# Patient Record
Sex: Male | Born: 1975 | Race: White | Hispanic: No | Marital: Single | Smoking: Never smoker
Health system: Southern US, Community
[De-identification: ages and names within clinical notes are randomized; demographics above are authoritative.]

## PROBLEM LIST (undated history)

## (undated) DIAGNOSIS — R519 Headache, unspecified: Secondary | ICD-10-CM

## (undated) DIAGNOSIS — F419 Anxiety disorder, unspecified: Secondary | ICD-10-CM

## (undated) DIAGNOSIS — K219 Gastro-esophageal reflux disease without esophagitis: Secondary | ICD-10-CM

## (undated) DIAGNOSIS — I484 Atypical atrial flutter: Secondary | ICD-10-CM

## (undated) DIAGNOSIS — I471 Supraventricular tachycardia, unspecified: Secondary | ICD-10-CM

## (undated) DIAGNOSIS — I4891 Unspecified atrial fibrillation: Secondary | ICD-10-CM

## (undated) DIAGNOSIS — R51 Headache: Secondary | ICD-10-CM

## (undated) DIAGNOSIS — F32A Depression, unspecified: Secondary | ICD-10-CM

## (undated) DIAGNOSIS — E785 Hyperlipidemia, unspecified: Secondary | ICD-10-CM

## (undated) DIAGNOSIS — F1010 Alcohol abuse, uncomplicated: Secondary | ICD-10-CM

## (undated) DIAGNOSIS — I10 Essential (primary) hypertension: Secondary | ICD-10-CM

## (undated) DIAGNOSIS — Q203 Discordant ventriculoarterial connection: Secondary | ICD-10-CM

## (undated) DIAGNOSIS — R011 Cardiac murmur, unspecified: Secondary | ICD-10-CM

## (undated) DIAGNOSIS — A770 Spotted fever due to Rickettsia rickettsii: Secondary | ICD-10-CM

## (undated) DIAGNOSIS — F329 Major depressive disorder, single episode, unspecified: Secondary | ICD-10-CM

## (undated) HISTORY — DX: Discordant ventriculoarterial connection: Q20.3

## (undated) HISTORY — PX: FRACTURE SURGERY: SHX138

## (undated) HISTORY — DX: Hyperlipidemia, unspecified: E78.5

## (undated) HISTORY — DX: Atypical atrial flutter: I48.4

## (undated) HISTORY — PX: FINGER ARTHROPLASTY: SHX5017

## (undated) HISTORY — PX: SEPTOSTOMY: SHX2394

## (undated) HISTORY — DX: Essential (primary) hypertension: I10

---
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
## 1998-04-17 ENCOUNTER — Ambulatory Visit (HOSPITAL_COMMUNITY): Admission: RE | Admit: 1998-04-17 | Discharge: 1998-04-17 | Payer: Self-pay | Admitting: *Deleted

## 1998-04-17 ENCOUNTER — Observation Stay (HOSPITAL_COMMUNITY): Admission: RE | Admit: 1998-04-17 | Discharge: 1998-04-18 | Payer: Self-pay | Admitting: Unknown Physician Specialty

## 1998-04-17 ENCOUNTER — Encounter: Admission: RE | Admit: 1998-04-17 | Discharge: 1998-04-17 | Payer: Self-pay | Admitting: *Deleted

## 1999-07-19 ENCOUNTER — Encounter: Admission: RE | Admit: 1999-07-19 | Discharge: 1999-07-19 | Payer: Self-pay | Admitting: *Deleted

## 1999-07-19 ENCOUNTER — Ambulatory Visit (HOSPITAL_COMMUNITY): Admission: RE | Admit: 1999-07-19 | Discharge: 1999-07-19 | Payer: Self-pay | Admitting: *Deleted

## 1999-07-19 ENCOUNTER — Encounter: Payer: Self-pay | Admitting: *Deleted

## 1999-09-20 ENCOUNTER — Ambulatory Visit (HOSPITAL_COMMUNITY): Admission: RE | Admit: 1999-09-20 | Discharge: 1999-09-20 | Payer: Self-pay | Admitting: *Deleted

## 2001-01-07 ENCOUNTER — Ambulatory Visit (HOSPITAL_COMMUNITY): Admission: RE | Admit: 2001-01-07 | Discharge: 2001-01-07 | Payer: Self-pay | Admitting: *Deleted

## 2001-01-07 ENCOUNTER — Encounter: Payer: Self-pay | Admitting: *Deleted

## 2001-01-22 ENCOUNTER — Ambulatory Visit (HOSPITAL_BASED_OUTPATIENT_CLINIC_OR_DEPARTMENT_OTHER): Admission: RE | Admit: 2001-01-22 | Discharge: 2001-01-22 | Payer: Self-pay | Admitting: *Deleted

## 2004-07-27 ENCOUNTER — Ambulatory Visit: Payer: Self-pay | Admitting: Internal Medicine

## 2004-11-17 ENCOUNTER — Emergency Department (HOSPITAL_COMMUNITY): Admission: EM | Admit: 2004-11-17 | Discharge: 2004-11-17 | Payer: Self-pay | Admitting: *Deleted

## 2006-03-20 ENCOUNTER — Ambulatory Visit: Payer: Self-pay | Admitting: Internal Medicine

## 2007-07-01 ENCOUNTER — Encounter: Payer: Self-pay | Admitting: Internal Medicine

## 2007-07-01 ENCOUNTER — Ambulatory Visit: Payer: Self-pay

## 2008-02-19 ENCOUNTER — Emergency Department (HOSPITAL_COMMUNITY): Admission: EM | Admit: 2008-02-19 | Discharge: 2008-02-19 | Payer: Self-pay | Admitting: Emergency Medicine

## 2008-11-25 ENCOUNTER — Telehealth: Payer: Self-pay | Admitting: Internal Medicine

## 2008-11-28 ENCOUNTER — Encounter (INDEPENDENT_AMBULATORY_CARE_PROVIDER_SITE_OTHER): Payer: Self-pay | Admitting: *Deleted

## 2008-11-28 DIAGNOSIS — Q203 Discordant ventriculoarterial connection: Secondary | ICD-10-CM | POA: Insufficient documentation

## 2008-12-20 ENCOUNTER — Telehealth: Payer: Self-pay | Admitting: Internal Medicine

## 2008-12-21 ENCOUNTER — Emergency Department (HOSPITAL_COMMUNITY): Admission: EM | Admit: 2008-12-21 | Discharge: 2008-12-21 | Payer: Self-pay | Admitting: Family Medicine

## 2009-01-03 ENCOUNTER — Encounter: Payer: Self-pay | Admitting: Internal Medicine

## 2009-01-03 ENCOUNTER — Ambulatory Visit: Payer: Self-pay

## 2009-01-24 ENCOUNTER — Ambulatory Visit: Payer: Self-pay | Admitting: Internal Medicine

## 2009-01-24 DIAGNOSIS — M629 Disorder of muscle, unspecified: Secondary | ICD-10-CM

## 2009-01-24 DIAGNOSIS — M6289 Other specified disorders of muscle: Secondary | ICD-10-CM | POA: Insufficient documentation

## 2009-01-25 LAB — CONVERTED CEMR LAB
Cholesterol: 184 mg/dL (ref 0–200)
HDL: 38.9 mg/dL — ABNORMAL LOW (ref >39.00)
LDL Cholesterol: 132 mg/dL — ABNORMAL HIGH (ref 0–99)
Total CHOL/HDL Ratio: 5
Triglycerides: 64 mg/dL (ref 0.0–149.0)
VLDL: 12.8 mg/dL (ref 0.0–40.0)

## 2009-01-26 ENCOUNTER — Ambulatory Visit: Payer: Self-pay | Admitting: Internal Medicine

## 2009-01-26 DIAGNOSIS — I10 Essential (primary) hypertension: Secondary | ICD-10-CM | POA: Insufficient documentation

## 2009-04-20 ENCOUNTER — Telehealth: Payer: Self-pay | Admitting: Internal Medicine

## 2009-04-21 ENCOUNTER — Ambulatory Visit: Payer: Self-pay | Admitting: Internal Medicine

## 2009-04-21 DIAGNOSIS — E785 Hyperlipidemia, unspecified: Secondary | ICD-10-CM | POA: Insufficient documentation

## 2009-04-21 DIAGNOSIS — I10 Essential (primary) hypertension: Secondary | ICD-10-CM | POA: Insufficient documentation

## 2009-04-25 LAB — CONVERTED CEMR LAB
BUN: 10 mg/dL (ref 6–23)
Basophils Absolute: 0 10*3/uL (ref 0.0–0.1)
Basophils Relative: 0.5 % (ref 0.0–3.0)
Calcium: 10 mg/dL (ref 8.4–10.5)
Chloride: 102 meq/L (ref 96–112)
Creatinine, Ser: 0.9 mg/dL (ref 0.4–1.5)
Eosinophils Absolute: 0.3 10*3/uL (ref 0.0–0.7)
Eosinophils Relative: 4.6 % (ref 0.0–5.0)
GFR calc non Af Amer: 104.59 mL/min (ref >60)
Glucose, Bld: 90 mg/dL (ref 70–99)
HCT: 47.6 % (ref 39.0–52.0)
Hemoglobin: 16.4 g/dL (ref 13.0–17.0)
Lymphocytes Relative: 22.5 % (ref 12.0–46.0)
Lymphs Abs: 1.5 10*3/uL (ref 0.7–4.0)
MCHC: 34.4 g/dL (ref 30.0–36.0)
MCV: 92.1 fL (ref 78.0–100.0)
Monocytes Absolute: 0.9 10*3/uL (ref 0.1–1.0)
Monocytes Relative: 14.2 % — ABNORMAL HIGH (ref 3.0–12.0)
Neutro Abs: 3.8 10*3/uL (ref 1.4–7.7)
Neutrophils Relative %: 58.2 % (ref 43.0–77.0)
Platelets: 252 10*3/uL (ref 150.0–400.0)
Potassium: 5.3 meq/L — ABNORMAL HIGH (ref 3.5–5.1)
RBC: 5.16 M/uL (ref 4.22–5.81)
RDW: 11.9 % (ref 11.5–14.6)
Sodium: 140 meq/L (ref 135–145)
WBC: 6.5 10*3/uL (ref 4.5–10.5)

## 2009-05-11 ENCOUNTER — Encounter: Payer: Self-pay | Admitting: Internal Medicine

## 2009-05-23 ENCOUNTER — Telehealth: Payer: Self-pay | Admitting: Internal Medicine

## 2009-06-07 ENCOUNTER — Telehealth: Payer: Self-pay | Admitting: Internal Medicine

## 2009-10-04 ENCOUNTER — Encounter (INDEPENDENT_AMBULATORY_CARE_PROVIDER_SITE_OTHER): Payer: Self-pay | Admitting: *Deleted

## 2010-04-26 ENCOUNTER — Encounter: Payer: Self-pay | Admitting: Internal Medicine

## 2010-04-26 ENCOUNTER — Ambulatory Visit: Payer: Self-pay | Admitting: Internal Medicine

## 2010-05-04 ENCOUNTER — Telehealth: Payer: Self-pay | Admitting: Internal Medicine

## 2010-08-14 NOTE — Progress Notes (Signed)
Summary: LABS NEEDED?  Phone Note Call from Patient Call back at [phone]   Caller: [facility] MOTHER Reason for Call: Talk to Nurse Summary of Call: PATIENT NEEDS TO HAVE LABS DONE PRIOR TO APPT 01-13-09?   Initial call taken by: Burnard Leigh,  December 20, 2008 10:19 AM  Follow-up for Phone Call        Talked to pt.s mother about labs done prior appointmet with MD on July 2nd 10. Because pt has not been in office   for a couple of years,Dr. Tenny Craw needs to see him first to decide what labs he needs to have drawn. Mother understood. Follow-up by: Ollen Gross, RN, BSN,  December 20, 2008 11:49 AM      Appended Document: LABS NEEDED? Needs and echo.  Can get that Day.  ALso needs fasting lipid panel.  Maybe he wants to come a couple days before.  Appended Document: LABS NEEDED? Fasting lab work scheduled for 01/10/2009.Marland KitchenMarland KitchenObed is aware

## 2010-08-14 NOTE — Progress Notes (Signed)
Summary: irregular heart beat/gas  Phone Note Call from Patient Call back at Home Phone [phone]   Caller: Patient Reason for Call: Talk to Nurse Summary of Call: irregular heart beat... not racing, feels like he has gas, has appt tomorrow, did not know if he needed to come today Initial call taken by: Migdalia Dk,  April 20, 2009 9:47 AM  Follow-up for Phone Call        pt has been feeling bad w/head cold that has moved into his chest, pt pulse is ok not too fast, c/o gas since eating fried chicken and green beans, no chest pain, pt is sch to see Dr Tenny Craw in AM at 8:15, advised to keep that appt, if symptoms change or heart begins to race or dev. chest pain will go to ER Meredith Staggers, RN  April 20, 2009 10:15 AM

## 2010-08-14 NOTE — Progress Notes (Signed)
Summary: Medication Question  Phone Note Call from Patient Call back at Home Phone [phone]   Caller: Patient Reason for Call: Talk to Nurse Summary of Call: req call back asap... has a question about meds Initial call taken by: Migdalia Dk,  May 23, 2009 11:27 AM  Follow-up for Phone Call        I spoke with the pt and he is very anxious and has a lot of stress in his life at this time.  The pt is going to see his primary MD about starting Xanax and he wanted to know if that would effect his Lisinopril.  I told the pt that Xanax should be okay to take with Lisinopril.  I also reminded the pt that he is due for bloodwork to follow-up on his potassium.  The patient said he would come in this week for bloodwork. Follow-up by: Julieta Gutting, RN, BSN,  May 23, 2009 11:44 AM

## 2010-08-14 NOTE — Progress Notes (Signed)
Summary: rx refill  Phone Note Refill Request Message from:  Patient on May 04, 2010 11:51 AM  Refills Requested: Medication #1:  LISINOPRIL 10 MG TABS 1/2  tablet every day [facility] # [phone]    Method Requested: Telephone to Pharmacy Initial call taken by: Roe Coombs,  May 04, 2010 11:52 AM Caller: Patient

## 2010-08-14 NOTE — Assessment & Plan Note (Signed)
Summary: 3 month follow up/mt   Visit Type:  Follow-up Primary Provider:  [facility] [place]  CC:  no complaints.  History of Present Illness: Mr. Matthew [name] is a 35 year old who, followed in clinic. He was last seen in 2010. 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 His last echocardiogram was in 2010. He also has a history of hypertension.  Is on Lisinopril for BP as well as his heart. He denies chest pains.  Breathing is good.  No palpitations.  He is fairly active but not like he used to be when he played baseball daily.  No edema.  Current Medications (verified): 1)  [facility] Brand Acid Reducer .Marland Kitchen.. 1 Tab Every Other Day If Needed 2)  Ibuprofen 200 Mg Tabs (Ibuprofen) .... As Needed 3)  Lisinopril 10 Mg Tabs (Lisinopril) .... 1/2  Tablet Every Day 4)  Alprazolam 0.5 Mg Tabs (Alprazolam) .Marland Kitchen.. 1 Tab As Directed  Allergies (verified): 1)  ! Asa 2)  ! Ibuprofen  Past History:  Past medical, surgical, family and social histories (including risk factors) reviewed, and no changes noted (except as noted below).  Past Medical History: Reviewed history from 04/21/2009 and no changes required. 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  Volar plate arthroplasty, right small finger proxima linterphalangeal joint.  Rashkind septostomy  Mustard procedure  Family History: Reviewed history from 01/12/2009 and no changes required. Father alive -Rheumatic  fever- HTN Mother alive healthy  Social History: Reviewed history from 01/12/2009 and no changes required.  non-smoker, no drug abuse, occasional drinker   Review of Systems       All systems reviewed.  Neg to the above problem except as noted above.  Vital Signs:  Patient profile:   35 year old male Height:       75 inches Weight:      219 pounds BMI:     27.47 Pulse rate:   87 / minute BP sitting:   104 / 81  (left arm) Cuff size:   large  Vitals Entered By: Burnett Kanaris, CNA (April 26, 2010 12:10 PM)  Physical Exam  Additional Exam:  Pateint is in NAD HEENT:  Normocephalic, atraumatic. EOMI, PERRLA.  Neck: JVP is normal. No thyromegaly. No bruits.  Lungs: clear to auscultation. No rales no wheezes.  Heart: Regular rate and rhythm. Normal S1.  Prominent S2.  Gr I/VI systolic murmur.  No S3.  Abdomen:  Supple, nontender. Normal bowel sounds. No masses. No hepatomegaly.  Extremities:   Good distal pulses throughout. No lower extremity edema.  Musculoskeletal :moving all extremities.  Neuro:   alert and oriented x3.    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  No evidence of decompensation of the RV.  No symp to suggest arrhythmia.  Would follow.  Keep on lisinopril.  Echo in June/july of next year.  Problem # 2:  HYPERTENSION, BENIGN (ICD-401.1) BP is better. His updated medication list for this problem includes:    Lisinopril 10 Mg Tabs (Lisinopril) .Marland Kitchen... 1/2  tablet every day  Problem # 3:  PREVENTIVE HEALTH CARE (ICD-V70.0) Counseled on diet and exercise.  Other Orders: EKG w/ Interpretation (93000)  Patient Instructions: 1)  Follow up in June/July of 2012  with echo that day.

## 2010-08-14 NOTE — Letter (Signed)
Summary: [facility]  [facility]   Imported By: Roderic Ovens 06/13/2009 10:55:53  _____________________________________________________________________  External Attachment:    Type:   Image     Comment:   External Document

## 2010-08-14 NOTE — Progress Notes (Signed)
Summary: what can he take over the counter  Phone Note Call from Patient Call back at Home Phone [phone]   Caller: Patient Reason for Call: Talk to Nurse Details for Reason: what can pt take for head cold, since he's on blood pressure meds. pcp was not contacted.  Initial call taken by: Lorne Skeens,  June 07, 2009 11:25 AM  Follow-up for Phone Call        spoke w/pt no decongestants and no pseudo. Meredith Staggers, RN  June 07, 2009 11:39 AM

## 2010-08-14 NOTE — Letter (Signed)
Summary: [facility]  [facility]   Imported By: Marylou Mccoy 07/27/2009 10:38:07  _____________________________________________________________________  External Attachment:    Type:   Image     Comment:   External Document

## 2010-08-14 NOTE — Letter (Signed)
Summary: Appointment - Missed  [facility], Main Office  [address]   [place], [place] [address]   Phone: [phone]  Fax: [phone]     October 04, 2009 MRN: [id number]   JULIOUS LANGLOIS [address] [place], [place]  [address]   Dear Mr. THIER,  Our records indicate you missed your appointment on 09/29/2009 with Dr.  Tenny Craw. It is very important that we reach you to reschedule this appointment. We look forward to participating in your health care needs. Please contact us at the number listed above at your earliest convenience to reschedule this appointment.     Sincerely,    Glass blower/designer

## 2010-08-14 NOTE — Assessment & Plan Note (Signed)
Summary: MISSED APPT 01-13-09@8 :45/SL   Visit Type:  Follow-up [facility] Provider:  [facility] [place]  CC:  no complaints.  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 He was last seen in April of 2009. He has a history of transposition of the great vessels (status post rash and septoplasty, status post Mustard procedure). His last echocardiogram was in 2008. He just had an echocardiogram done the past couple weeks.  Since seen he has been doing well. He denies shortness of breath. He denies chest pain. He denies palpitations. He denies edema. He is sleeping okay. His wife is about to have a baby in the next couple weeks. He says now, that he's been married. His activities have gone down some and he is eating more.  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 1 Tab Every Other Day If Needed  Allergies (verified): 1)  ! Asa 2)  ! 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  Rashkind septostomy  Mustard procedure  Family History: Last updated: 01/12/2009 Father alive -Rheumatic  fever- HTN Mother alive healthy  Social History: Last updated: 01/12/2009  non-smoker, no drug abuse, occasional drinker   Review of Systems       all systems reviewed. Negative to the above problem except as noted.  Vital Signs:  Patient profile:   35 year old male Height:      75 inches Weight:      213 pounds BMI:     26.72 BP sitting:   137 / 90  (left arm)  Vitals Entered By: Burnett Kanaris, CNA (January 26, 2009 9:36  AM)  Physical Exam  General:  Well developed, well nourished, in no acute distress. Mouth:  Teeth, gums and palate normal. Oral mucosa normal. Neck:  JVP normal. No bruits. No thyromegaly. Lungs:  third auscultation without rales, or wheezes. Heart:  regular rate, rhythm, S1 prominent S2. Grade 3/6 systolic murmur. Left sternal border. Prominent RV E. Abdomen:  supple no hepatomegaly.no masses Pulses:  2+ pulses. 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 I'm not convinced this is different from previous. There is only mild MR. Conduits appear to be patent. With his borderline blood pressure in the findings above, I would add an ACE inhibitor to his regimen. I will follow up in the early fall off the mat. Will be planned for 10 days.  Problem # 2:  HYPERTENSION, BORDERLINE (ICD-401.9) Will add lisinopril to his regimen. One half tablet per day. His updated medication list for this problem includes:    Lisinopril 5 Mg Tabs (Lisinopril) .Marland Kitchen... Take one tablet by mouth daily  Problem # 3:  PREVENTIVE HEALTH CARE (ICD-V70.0) counseled on lipids. Patient would like to try diet activity. This is reasonable. His LDL is minimally increased, but with his congenital disease. Would want to prevent future heart events.  Patient Instructions: 1)  Your physician recommends that you schedule a follow-up appointment in: October 2010 2)  Your physician recommends that you return for lab work in: bmet in 10 days 402.1

## 2010-08-14 NOTE — Progress Notes (Signed)
Summary: WHAT LABS ARE NEEDED PRIOR TO APPT?  Phone Note Call from Patient Call back at [phone]   Caller: Mom Summary of Call: PATIENT NEEDS TO SCHEDULE LABS...WHAT IS NE2007.HAS NOT BEEN SEEN SINCE  Initial call taken by: Burnard Leigh,  Nov 25, 2008 12:29 PM  Follow-up for Phone Call        Pt has not been seen since 2007.  Pt has appt 01/13/09 with Dr Tenny Craw.  Will send to Dr Tenny Craw for review (? echo, labs) Julieta Gutting, RN, BSN  Nov 25, 2008 12:58 PM   Additional Follow-up for Phone Call Additional follow up Details #1::        Should have echo. Could set up for day of appt.      Appended Document: WHAT LABS ARE NEEDED PRIOR TO APPT? Lm. for pt, does not need labs needs echo scheduled per Dr. Tenny Craw...will send to [facility] to schedule.

## 2010-08-14 NOTE — Assessment & Plan Note (Signed)
Summary: 3 month rov/'sl   Primary Matthew [name]:  [facility] [place]   History of Present Illness: Matthew [name] is a 35 year old with a history of transposition of the great vessels status post rash can septostomy; status post mustard procedure). I last saw him in July.  When I saw him his blood pressure was minimally elevated. I added a low dose lisinopril to his regimen 2.5 mg.  Since seen he has done okay until recently. He has a sinus infection that may be clearing. She denies fevers, chills. Did have some tooth pain, but gone. He does say he is tending to be more of a mouth breather at night. He has bought nasal strips to try to help with this.  Yesterday he had a few palpitations, lasting a few seconds, not associated with dizziness, or shortness of breath.  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 1 Tab Every Other Day If Needed 2)  Lisinopril 5 Mg Tabs (Lisinopril) .... Take One Tablet By Mouth Daily  Current Medications (verified): 1)  [employer] Brand Acid Reducer .Marland Kitchen.. 1 Tab Every Other Day If Needed 2)  Lisinopril 5 Mg Tabs (Lisinopril) .... Take1/2 Tablet By Mouth Daily 3)  Ibuprofen 200 Mg Tabs (Ibuprofen) .... As Needed  Allergies (verified): 1)  ! Asa 2)  ! Ibuprofen  Past History:  Past Surgical History: Last updated: 01/12/2009  Volar plate arthroplasty, right small finger proxima linterphalangeal joint.  Rashkind septostomy  Mustard procedure  Family History: Last updated: 01/12/2009 Father alive -Rheumatic  fever- HTN Mother alive healthy  Social History: Last updated: 01/12/2009  non-smoker, no drug abuse, occasional drinker   Past Medical History: Current Problems:  COMPLETE TRANSPOSITION OF GREAT VESSELS  (ICD-745.10) hypertension dyslipidemia  Vital Signs:  Patient profile:   35 year old male Height:      75 inches Weight:      219 pounds BMI:     27.47 Pulse rate:   94 / minute Resp:     18 per minute BP sitting:   138 / 96  (right arm)  Vitals Entered By: Marrion Coy, CNA (April 21, 2009 8:56 AM)  Physical Exam  General:  Well developed, well nourished, in no acute distress. Head:  normocephalic and atraumatic Neck:  JVP is normal Lungs:  CTA Heart:  RRR.  S1, Prominent S2.  No significant murmurs.  NO S3 Abdomen:  No hepatomegly Extremities:  No edema   EKG  Procedure date:  04/21/2009  Findings:      sinus rhythm, 94 beats per minute. Incomplete right bundle branch block. RVH. LVH.ST-T wave changes consistent with strain.  no significant change from previous  Impression & Recommendations:  Problem # 1:  HYPERTENSION, BENIGN (ICD-401.1) blood pressure is still a little high, diastolic in the 90s. I would increase his lisinopril to, 5 we'll check labs today. I will see him back in January. Encouraged him to lose weight. He is going to see dietary The following medications were removed from the medication list:    Lisinopril 5 Mg Tabs (Lisinopril) .Marland Kitchen... Take one tablet by mouth daily His updated medication list for this problem includes:    Lisinopril 10 Mg Tabs (Lisinopril) .Marland Kitchen... 1 tablet every day  Problem # 2:  COMPLETE TRANSPOSITION OF GREAT  VESSELS (ICD-745.10) status post mustard procedure. Clinically doing well. Echo remains stable. I am  going up on the lisinopril to improve blood pressure control, afterload reduction.  note short burst of palpitations. Some like isolated skips I'm not concerned about significant arrhythmia. Again, avoid decongestants/stimulant  Problem # 3:  HYPERLIPIDEMIA-MIXED (ICD-272.4) have set up for dietary referral.  Problem # 4:  PREVENTIVE HEALTH CARE (ICD-V70.0) patient with a lot of nasal congestion sounds like inflammation  of the sinuses does not appear to be a bacterial sinusitis with his mouth breathing at night. I would add a nasal steroid see if this helps. Followup in January  Other Orders: EKG w/ Interpretation (93000) TLB-CBC Platelet - w/Differential (85025-CBCD) TLB-BMP (Basic Metabolic Panel-BMET) (80048-METABOL)  Patient Instructions: 1)  Your physician recommends that you return for lab work in: lab work today... cbc bmet... we will call you with results. 2)  An appointment for you to see the Dietician has been scheduled. Please keep your appointment or be sure to call if you need to reschedule.  dietician will call you with appointment. 3)  Your physician wants you to follow-up in:  January 2011  You will receive a reminder letter in the mail two months in advance. If you don't receive a letter, please call our office to schedule the follow-up appointment. Prescriptions: NASONEX 50 MCG/ACT SUSP (MOMETASONE FUROATE) 1 spray each nosil every dayl  #1 x 6   Entered by:   Layne Benton, RN, BSN   Authorized by:   Sherrill Raring, MD, [facility]   Signed by:   Layne Benton, RN, BSN on 04/21/2009   Method used:   Electronically to        [facility] [phone] (retail)       [address]       [place], [place]  [address]       Ph: [phone]       Fax: [phone]   RxID:   [id number] LISINOPRIL 10 MG TABS (LISINOPRIL) 1 tablet every day  #30 x 6   Entered by:   Layne Benton, RN, BSN   Authorized by:   Sherrill Raring, MD, [facility]   Signed by:   Layne Benton, RN, BSN on 04/21/2009   Method used:   Electronically to        [facility] [phone]* (retail)       [address]       [place], [place]  [address]       Ph: [phone]       Fax: [phone]   RxID:   [id number]

## 2010-11-30 NOTE — Assessment & Plan Note (Signed)
[facility]                              CARDIOLOGY OFFICE NOTE   Matthew [name], Matthew [name]                  MRN:          [id number]  DATE:03/20/2006                            DOB:          1976/03/21    IDENTIFICATION:  Matthew [name] is a 35 year old gentleman with a history of  transposition of the great vessels (status post Rashkind septostomy, status  post Mustard procedure).  I last saw him back in January 2006.  Last echo  prior to this was in 2005.   Since I have seen him he has done well.  He quit his job at [employer] and says  his blood pressure has improved and his stress has gone markedly down.  He  denies shortness of breath, no chest pain, no palpitations.   CURRENT MEDICATIONS:  None.   PHYSICAL EXAMINATION:  GENERAL:  Patient in no distress.  Blood pressure  136/88.  Pulse is 77 and regular.  Weight 188.  LUNGS:  Clear.  NECK:  JVP is normal.  CARDIAC:  Regular rate and rhythm.  S1.  Prominent S2.  Grade 1/6 systolic  murmur left sternal border.  Prominent increased RV impulse.  ABDOMEN:  Benign.  No hepatomegaly.  EXTREMITIES:  Equal onset.  No edema.   12-lead EKG normal sinus rhythm 77 beats per minute.  ST depression V2  through V6-1.  No significant change.  Slightly more prominent than  previous.   IMPRESSION:  1. Matthew [name] is a 35 year old with transposition of the great vessels,      status post Mustard procedure.  Clinically appears to be doing very      well.  In regard to his blood pressure he has checked it and he says it      has been running good.  Today upper limits of normal.  2. I would recommend another echocardiogram to evaluate.  Also check a      fasting lipid panel given his age and schedule tentatively followup in      18 months time.  3. I encouraged him to continue to remain active.  He is thinking of      getting a bicycle and I think this would be good exercise for him.      Pricilla Riffle, MD, [facility]    PVR/MedQ  DD:  03/20/2006  DT:  03/21/2006  Job #:  [id number]

## 2010-11-30 NOTE — Op Note (Signed)
. [facility]  Patient:    Matthew [name], Matthew [name]                  MRN: [id number] Proc. Date: 01/22/01 Adm. Date:  [id number] Attending:  Meyerdierks, Emelda Fear CC:         Jerelene Redden, MD   Operative Report  PREOPERATIVE DIAGNOSIS:  Comminuted intra-articular fracture dislocation proximal interphalangeal joint, right small finger.  POSTOPERATIVE DIAGNOSIS:  Comminuted intra-articular fracture dislocation proximal interphalangeal joint, right small finger.  OPERATION PERFORMED:  Volar plate arthroplasty, right small finger proximal interphalangeal joint.  SURGEON:  Lowell Bouton, M.D.  ANESTHESIA:  General.  OPERATIVE FINDINGS:  The patient had a comminuted fracture of the volar half of the articular surface of the middle phalanx at the PIP joint of the right small finger.  The middle phalanx was subluxed dorsally on the proximal phalanx.  The articular surface appeared relatively smooth without significant degeneration.  DESCRIPTION OF PROCEDURE:  Under general anesthesia with a tourniquet on the right arm, the right hand was prepped and draped in the usual sterile fashion and after exsanguinating the limb the tourniquet was inflated to 250 mmHg.  A V-shaped incision was made over the volar aspect of the PIP joint with a radially based flap.  Sharp dissection was carried through the subcutaneous tissues.  The bleeding points were coagulated.  Blunt dissection was carried down to the flexor sheath and the A3 pulley was incised transversely.  The flexor tendons were retracted radially and the fracture fragment was identified.  The volar plate was elevated from distal to proximal and the bone fragments attached to the volar plate were removed.  The collateral ligaments were then released radially and ulnarly with a Beaver blade.  The joint was hyperextended and the fracture fragments volarly were removed with a rongeur. The  dorsal half of the articular surface was present and after completely excising the collateral ligaments, reduction of the joint could be obtained. The PIP and DIP joints were quite stiff as the injury was three weeks old. After reducing the joint, the finger was manipulated down into full flexion at both the PIP and DIP joint.  The volar plate was then freed up as far proximal as possible and a 3-0 Prolene suture was placed in a Kessler fashion in the volar plate.  Two Mellody Dance needles were passed through the fracture from the volar aspect to the dorsal aspect of the middle phalanx.  This was done with the DIP flexed, so as not to impale the extensor mechanism.  The Prolene was then passed through the Plymouth needle and brought out dorsally and tied over a button.  This reduced the joint and x-ray showed good alignment.  The volar plate then was advanced into the defect in the bone after tying the Prolene over a button.  A 35 K-wire was then used to pin the PIP joint in about 30 degrees of flexion.  X-ray showed reduction of the joint.  The wound was irrigated with saline.  The pulley system was repaired with a 4-0 Vicryl and the skin with a 4-0 nylon.  Sterile dressings were applied followed by a dorsal protective splint.  The tourniquet was released with good circulation to the hand.  0.5% Marcaine was inserted as a digital block for pain control. The patient went to the recovery room awake and stable in good condition. DD:  01/22/01 TD:  01/22/01 Job: [id number]

## 2011-02-22 ENCOUNTER — Encounter: Payer: Self-pay | Admitting: Internal Medicine

## 2011-02-25 ENCOUNTER — Telehealth: Payer: Self-pay | Admitting: Internal Medicine

## 2011-02-25 ENCOUNTER — Ambulatory Visit: Payer: Self-pay | Admitting: Internal Medicine

## 2011-02-25 NOTE — Telephone Encounter (Signed)
Spoke with Dr.Ross about this patient. She ordered echo. Will schedule office visit and echo on the same day. Lorne Skeens to call patient with appointments.

## 2011-02-25 NOTE — Telephone Encounter (Signed)
Pt would like to be set up for echo on same day he see dr. Tenny Craw.

## 2011-02-26 ENCOUNTER — Telehealth: Payer: Self-pay | Admitting: Internal Medicine

## 2011-02-26 NOTE — Telephone Encounter (Signed)
Pt called and said he thinks he need echo before his f/u appt.  No recall in .  Please ck with Dr. Tenny Craw and confirm that this is needed before appt.  Please let patient know.  Aware that Annice Pih is off today.

## 2011-02-26 NOTE — Telephone Encounter (Signed)
Please see previous phone note. Will route to [place] to schedule the echocardiogram and f/u with Dr. Tenny Craw.

## 2011-04-03 ENCOUNTER — Encounter: Payer: Self-pay | Admitting: *Deleted

## 2011-04-12 ENCOUNTER — Ambulatory Visit (HOSPITAL_COMMUNITY): Payer: BC Managed Care – PPO | Attending: Internal Medicine | Admitting: Radiology

## 2011-04-12 ENCOUNTER — Encounter: Payer: Self-pay | Admitting: Internal Medicine

## 2011-04-12 ENCOUNTER — Ambulatory Visit (INDEPENDENT_AMBULATORY_CARE_PROVIDER_SITE_OTHER): Payer: BC Managed Care – PPO | Admitting: Internal Medicine

## 2011-04-12 VITALS — BP 134/87 | HR 87 | Ht 74.0 in | Wt 214.0 lb

## 2011-04-12 DIAGNOSIS — I079 Rheumatic tricuspid valve disease, unspecified: Secondary | ICD-10-CM | POA: Insufficient documentation

## 2011-04-12 DIAGNOSIS — J309 Allergic rhinitis, unspecified: Secondary | ICD-10-CM

## 2011-04-12 DIAGNOSIS — Z9109 Other allergy status, other than to drugs and biological substances: Secondary | ICD-10-CM

## 2011-04-12 DIAGNOSIS — E785 Hyperlipidemia, unspecified: Secondary | ICD-10-CM

## 2011-04-12 DIAGNOSIS — J069 Acute upper respiratory infection, unspecified: Secondary | ICD-10-CM

## 2011-04-12 DIAGNOSIS — I10 Essential (primary) hypertension: Secondary | ICD-10-CM

## 2011-04-12 DIAGNOSIS — Q203 Discordant ventriculoarterial connection: Secondary | ICD-10-CM | POA: Insufficient documentation

## 2011-04-12 DIAGNOSIS — R5383 Other fatigue: Secondary | ICD-10-CM

## 2011-04-12 DIAGNOSIS — I080 Rheumatic disorders of both mitral and aortic valves: Secondary | ICD-10-CM | POA: Insufficient documentation

## 2011-04-12 DIAGNOSIS — I379 Nonrheumatic pulmonary valve disorder, unspecified: Secondary | ICD-10-CM | POA: Insufficient documentation

## 2011-04-12 DIAGNOSIS — I359 Nonrheumatic aortic valve disorder, unspecified: Secondary | ICD-10-CM

## 2011-04-12 DIAGNOSIS — R5381 Other malaise: Secondary | ICD-10-CM

## 2011-04-12 MED ORDER — FLUTICASONE PROPIONATE 50 MCG/ACT NA SUSP: 2.0000 | Freq: Every day | NASAL | Status: DC

## 2011-04-12 MED ORDER — AZITHROMYCIN 500 MG PO TABS: 500.0000 mg | ORAL_TABLET | Freq: Every day | ORAL | Status: AC

## 2011-04-12 NOTE — Patient Instructions (Signed)
Fasting Lipid panel on 10/3.

## 2011-04-12 NOTE — Progress Notes (Signed)
HPI Patient is a 35 year old with a history of TGV, sp Mustard procedure. He also has a history of HTN I saw him in clinic 1 year ago. Since seen he has done well.  Breathing is OK except for a recent sinus infection (greenish drainage).  Denies CP.  No Palpitations.  No dizziness Allergies  Allergen Reactions  . Aspirin   . Ibuprofen     Current Outpatient Prescriptions  Medication Sig Dispense Refill  . ALPRAZolam (XANAX) 0.5 MG tablet Take 0.5 mg by mouth at bedtime as needed.        . Cimetidine (ACID REDUCER PO) Take by mouth.        . hydrOXYzine (ATARAX/VISTARIL) 25 MG tablet As needed for itching      . ibuprofen (ADVIL,MOTRIN) 200 MG tablet Take 200 mg by mouth every 6 (six) hours as needed.        Marland Kitchen lisinopril (PRINIVIL,ZESTRIL) 10 MG tablet Take 5 mg by mouth daily.          Past Medical History  Diagnosis Date  . Complete transposition of great vessels   . Hypertension   . Dyslipidemia     Past Surgical History  Procedure Date  . Volar plate arthroplasty, right small finger proxima linterphalangeal joint   . Septostomy     rashkind  . Mustard procedure     Family History  Problem Relation Age of Onset  . Rheumatic fever Father   . Hypertension Father     History   Social History  . Marital Status: Married    Spouse Name: N/A    Number of Children: N/A  . Years of Education: N/A   Occupational History  . Not on file.   Social History Main Topics  . Smoking status: Never Smoker   . Smokeless tobacco: Not on file  . Alcohol Use: No  . Drug Use: No  . Sexually Active: Not on file   Other Topics Concern  . Not on file   Social History Narrative  . No narrative on file    Review of Systems:  All systems reviewed.  They are negative to the above problem except as previously stated.  Vital Signs: BP 134/87  Pulse 87  Ht 6\' 2"  (1.88 m)  Wt 214 lb (97.07 kg)  BMI 27.48 kg/m2  Physical Exam Pateint is in NAD  HEENT:  Normocephalic,  atraumatic. EOMI, PERRLA.  Neck: JVP is normal. No thyromegaly. No bruits.  Lungs: clear to auscultation. No rales no wheezes.  Heart: Regular rate and rhythm. Normal S1, S2. No S3.   No significant murmurs. PMI not displaced.  Abdomen:  Supple, nontender. Normal bowel sounds. No masses. No hepatomegaly.  Extremities:   Good distal pulses throughout. No lower extremity edema.  Musculoskeletal :moving all extremities.  Neuro:   alert and oriented x3.  CN II-XII grossly intact.  EKG:    Assessment and Plan:

## 2011-04-17 ENCOUNTER — Other Ambulatory Visit (INDEPENDENT_AMBULATORY_CARE_PROVIDER_SITE_OTHER): Payer: BC Managed Care – PPO | Admitting: *Deleted

## 2011-04-17 DIAGNOSIS — R5381 Other malaise: Secondary | ICD-10-CM

## 2011-04-17 DIAGNOSIS — R5383 Other fatigue: Secondary | ICD-10-CM

## 2011-04-17 DIAGNOSIS — Q203 Discordant ventriculoarterial connection: Secondary | ICD-10-CM

## 2011-04-17 LAB — BASIC METABOLIC PANEL
BUN: 12 mg/dL (ref 6–23)
CO2: 28 mEq/L (ref 19–32)
Calcium: 9.4 mg/dL (ref 8.4–10.5)
Chloride: 106 mEq/L (ref 96–112)
Creatinine, Ser: 0.8 mg/dL (ref 0.4–1.5)
GFR: 115 mL/min (ref >60.00)
Glucose, Bld: 108 mg/dL — ABNORMAL HIGH (ref 70–99)
Potassium: 4.6 mEq/L (ref 3.5–5.1)
Sodium: 141 mEq/L (ref 135–145)

## 2011-04-17 LAB — LIPID PANEL
Cholesterol: 186 mg/dL (ref 0–200)
HDL: 46.7 mg/dL (ref >39.00)
LDL Cholesterol: 130 mg/dL — ABNORMAL HIGH (ref 0–99)
Total CHOL/HDL Ratio: 4
Triglycerides: 45 mg/dL (ref 0.0–149.0)
VLDL: 9 mg/dL (ref 0.0–40.0)

## 2011-04-17 LAB — TESTOSTERONE: Testosterone: 580.69 ng/dL (ref 350.00–890.00)

## 2011-04-22 ENCOUNTER — Telehealth: Payer: Self-pay | Admitting: Internal Medicine

## 2011-04-22 ENCOUNTER — Other Ambulatory Visit: Payer: Self-pay | Admitting: *Deleted

## 2011-04-22 MED ORDER — LISINOPRIL 10 MG PO TABS: 5.0000 mg | ORAL_TABLET | Freq: Every day | ORAL | Status: DC

## 2011-04-22 NOTE — Telephone Encounter (Signed)
Pt calling for lab results.

## 2011-04-22 NOTE — Telephone Encounter (Signed)
Pt understands to eat benecol spread two tablespoons a day and will find it in the dairy isle at the grocery store.

## 2011-05-12 DIAGNOSIS — J069 Acute upper respiratory infection, unspecified: Secondary | ICD-10-CM | POA: Insufficient documentation

## 2011-05-12 NOTE — Assessment & Plan Note (Signed)
Patient with symptomatic sinus infection.  Rx with Z pack and nasal steroids.

## 2011-05-12 NOTE — Assessment & Plan Note (Addendum)
Will need to follow.  Counselled on diet.

## 2011-05-12 NOTE — Assessment & Plan Note (Signed)
Continue meds. 

## 2011-05-12 NOTE — Assessment & Plan Note (Signed)
Doing well. No change in regimen.  

## 2011-06-11 ENCOUNTER — Telehealth: Payer: Self-pay

## 2011-06-11 NOTE — Telephone Encounter (Signed)
Mr Fiallos states Dr Tenny Craw told him to let her know if his sinus problems did not resolve.  He states he is still having dizziness, headaches and sinus problems.  He states he saw his pcp but he did not help him.  Pt is aware that Dr Tenny Craw is not in the office today.

## 2011-06-14 NOTE — Telephone Encounter (Signed)
I would refer to Matthew [name].  I am not sure if he has seen him in the past If he wants another recomm I can make if he requests.

## 2011-06-17 NOTE — Telephone Encounter (Signed)
Called patient. His sinus infection is currently being treated by his primary care doctor in [facility] so he did not need a referral to an ENT at this time.

## 2011-10-21 ENCOUNTER — Telehealth: Payer: Self-pay | Admitting: Internal Medicine

## 2011-10-21 NOTE — Telephone Encounter (Signed)
New msg Pt wants to know if he can non-drowsy claritin for his allergies. Please call back and let him know.

## 2011-10-21 NOTE — Telephone Encounter (Signed)
Patient calling to make sure ok to take plain claritin for his allergies.Advised ok to take plain claritin,do not take claritin with D.

## 2012-04-17 ENCOUNTER — Other Ambulatory Visit: Payer: Self-pay | Admitting: Internal Medicine

## 2012-08-10 ENCOUNTER — Ambulatory Visit (INDEPENDENT_AMBULATORY_CARE_PROVIDER_SITE_OTHER): Payer: BC Managed Care – PPO | Admitting: Internal Medicine

## 2012-08-10 ENCOUNTER — Encounter: Payer: Self-pay | Admitting: Internal Medicine

## 2012-08-10 VITALS — BP 131/90 | HR 89 | Ht 75.0 in | Wt 232.0 lb

## 2012-08-10 DIAGNOSIS — Z9109 Other allergy status, other than to drugs and biological substances: Secondary | ICD-10-CM

## 2012-08-10 DIAGNOSIS — Q203 Discordant ventriculoarterial connection: Secondary | ICD-10-CM

## 2012-08-10 DIAGNOSIS — I10 Essential (primary) hypertension: Secondary | ICD-10-CM

## 2012-08-10 DIAGNOSIS — E785 Hyperlipidemia, unspecified: Secondary | ICD-10-CM

## 2012-08-10 LAB — CBC WITH DIFFERENTIAL/PLATELET
Basophils Absolute: 0 10*3/uL (ref 0.0–0.1)
Basophils Relative: 0.3 % (ref 0.0–3.0)
Eosinophils Absolute: 0.2 10*3/uL (ref 0.0–0.7)
Eosinophils Relative: 2.5 % (ref 0.0–5.0)
HCT: 46.7 % (ref 39.0–52.0)
Hemoglobin: 15.9 g/dL (ref 13.0–17.0)
Lymphocytes Relative: 22.3 % (ref 12.0–46.0)
Lymphs Abs: 2 10*3/uL (ref 0.7–4.0)
MCHC: 34.2 g/dL (ref 30.0–36.0)
MCV: 93.1 fl (ref 78.0–100.0)
Monocytes Absolute: 0.9 10*3/uL (ref 0.1–1.0)
Monocytes Relative: 10.3 % (ref 3.0–12.0)
Neutro Abs: 5.9 10*3/uL (ref 1.4–7.7)
Neutrophils Relative %: 64.6 % (ref 43.0–77.0)
Platelets: 299 10*3/uL (ref 150.0–400.0)
RBC: 5.01 Mil/uL (ref 4.22–5.81)
RDW: 13.1 % (ref 11.5–14.6)
WBC: 9.1 10*3/uL (ref 4.5–10.5)

## 2012-08-10 LAB — BASIC METABOLIC PANEL
BUN: 10 mg/dL (ref 6–23)
CO2: 30 mEq/L (ref 19–32)
Calcium: 10.1 mg/dL (ref 8.4–10.5)
Chloride: 103 mEq/L (ref 96–112)
Creatinine, Ser: 0.8 mg/dL (ref 0.4–1.5)
GFR: 112.5 mL/min (ref >60.00)
Glucose, Bld: 104 mg/dL — ABNORMAL HIGH (ref 70–99)
Potassium: 5 mEq/L (ref 3.5–5.1)
Sodium: 141 mEq/L (ref 135–145)

## 2012-08-10 LAB — LIPID PANEL
Cholesterol: 226 mg/dL — ABNORMAL HIGH (ref 0–200)
HDL: 47.1 mg/dL (ref >39.00)
Total CHOL/HDL Ratio: 5
Triglycerides: 85 mg/dL (ref 0.0–149.0)
VLDL: 17 mg/dL (ref 0.0–40.0)

## 2012-08-10 LAB — TSH: TSH: 0.55 u[IU]/mL (ref 0.35–5.50)

## 2012-08-10 LAB — LDL CHOLESTEROL, DIRECT: Direct LDL: 162.4 mg/dL

## 2012-08-10 NOTE — Patient Instructions (Addendum)
STOP Lisinopril.  LABS TODAY:  CBC, BMET, LIPID, TSH  Call us Thursday to report cough progression after stopping lisinopril.  [phone] Kandee Keen  Your physician wants you to follow-up in: 1 year with Dr. Tenny Craw.  You will receive a reminder letter in the mail two months in advance. If you don't receive a letter, please call our office to schedule the follow-up appointment.

## 2012-08-10 NOTE — Progress Notes (Signed)
HPI Patient is a 37 year old with a history of TGV, sp Mustard procedure. He also has a history of HTN I saw him in clinic 1 year ago Since seen he has been under increased stress with family issues.  Father had afib and CVA.  Now with new baby. Not exercising  Wt up. Denies CP  Breathing is OK.  Does note intermitt dry cough  Allergies  Allergen Reactions  . Aspirin   . Ibuprofen     Current Outpatient Prescriptions  Medication Sig Dispense Refill  . acetaminophen (TYLENOL) 325 MG tablet As needed      . ALPRAZolam (XANAX) 0.5 MG tablet Take 0.5 mg by mouth at bedtime as needed.        . Cimetidine (ACID REDUCER PO) Take by mouth.        . fluticasone (FLONASE) 50 MCG/ACT nasal spray Place 2 sprays into the nose daily.      . hydrOXYzine (ATARAX/VISTARIL) 25 MG tablet As needed for itching      . ibuprofen (ADVIL,MOTRIN) 200 MG tablet Take 200 mg by mouth every 6 (six) hours as needed.        Marland Kitchen lisinopril (PRINIVIL,ZESTRIL) 10 MG tablet TAKE ONE-HALF TABLET BY MOUTH EVERY DAY  90 tablet  0  . sodium chloride (OCEAN) 0.65 % SOLN nasal spray Place 1 spray into the nose as needed.        Past Medical History  Diagnosis Date  . Complete transposition of great vessels   . Hypertension   . Dyslipidemia     Past Surgical History  Procedure Date  . Volar plate arthroplasty, right small finger proxima linterphalangeal joint   . Septostomy     rashkind  . Mustard procedure     Family History  Problem Relation Age of Onset  . Rheumatic fever Father   . Hypertension Father     History   Social History  . Marital Status: Married    Spouse Name: N/A    Number of Children: N/A  . Years of Education: N/A   Occupational History  . Not on file.   Social History Main Topics  . Smoking status: Never Smoker   . Smokeless tobacco: Not on file  . Alcohol Use: No  . Drug Use: No  . Sexually Active: Not on file   Other Topics Concern  . Not on file   Social History Narrative   . No narrative on file    Review of Systems:  All systems reviewed.  They are negative to the above problem except as previously stated.  Vital Signs: BP 131/90  Pulse 89  Ht 6\' 3"  (1.905 m)  Wt 232 lb (105.235 kg)  BMI 29.00 kg/m2  Physical Exam Patient is in NAD HEENT:  Normocephalic, atraumatic. EOMI, PERRLA.  Neck: JVP is normal.  No bruits.  Lungs: clear to auscultation. No rales no wheezes.  Heart: Regular rate and rhythm. Normal S1  Prom S2. No S3.   No significant murmurs. PMI not displaced.  Abdomen:  Supple, nontender. Normal bowel sounds. No masses. No hepatomegaly.  Extremities:   Good distal pulses throughout. No lower extremity edema.  Musculoskeletal :moving all extremities.  Neuro:   alert and oriented x3.  CN II-XII grossly intact.  EKG  SR 89 bpm.  Incompl RBBB.  Marked T wave inversion in anterolateral leads. Assessment and Plan:  1.  HTN  BP is not optimal  150 on recheck Will need better Rx. Must  clear up if cough related to lisinopril  I have asked him to hold for a couple days and call back with response  2. Congenital heart disease.  Volume status looks good  No signs of R heart failure.  3.  HCM  Will recheck lipids  Have encouraged him to watch diet, get wt down.  Walk.

## 2012-08-13 ENCOUNTER — Telehealth: Payer: Self-pay | Admitting: Internal Medicine

## 2012-08-13 ENCOUNTER — Other Ambulatory Visit: Payer: Self-pay | Admitting: *Deleted

## 2012-08-13 DIAGNOSIS — E785 Hyperlipidemia, unspecified: Secondary | ICD-10-CM

## 2012-08-13 MED ORDER — ATORVASTATIN CALCIUM 10 MG PO TABS: 10.0000 mg | ORAL_TABLET | Freq: Every day | ORAL | Status: DC

## 2012-08-13 NOTE — Telephone Encounter (Signed)
Noted  

## 2012-08-13 NOTE — Telephone Encounter (Signed)
New Problem    Was told to call and follow up with nurse.

## 2012-08-17 ENCOUNTER — Other Ambulatory Visit: Payer: Self-pay | Admitting: *Deleted

## 2012-08-17 DIAGNOSIS — I10 Essential (primary) hypertension: Secondary | ICD-10-CM

## 2012-08-17 MED ORDER — LISINOPRIL 10 MG PO TABS: 10.0000 mg | ORAL_TABLET | Freq: Every day | ORAL | Status: DC

## 2012-08-25 ENCOUNTER — Telehealth: Payer: Self-pay | Admitting: Internal Medicine

## 2012-08-25 NOTE — Telephone Encounter (Signed)
New problem    C/O side effect from blood pressure medication . Coughing. Spoken with another friend who had the same symptoms & medication. Would like to change.

## 2012-08-25 NOTE — Telephone Encounter (Signed)
Pt called back and even though we stopped his Lisinopril for 2-3 days and his cough did not improve, his buddies have convinced him his cough is due to his BP meds.  Pt is requesting to try a different medication.

## 2012-08-26 NOTE — Telephone Encounter (Signed)
Contacted patient WIll Rx with Cozaar 50  Start with 25. Call in 1 wk with response / cough Rx called in to [facility] in [place].

## 2012-09-03 ENCOUNTER — Telehealth: Payer: Self-pay | Admitting: Internal Medicine

## 2012-09-03 NOTE — Telephone Encounter (Signed)
New Problem:    Patient called in with a question about one of his new BP medications.  Patient did not remember the name of his medication.  Please call back.

## 2012-09-04 NOTE — Telephone Encounter (Signed)
Pt will recheck blood pressure over the weekend and call with results on Monday.

## 2012-09-08 ENCOUNTER — Telehealth: Payer: Self-pay | Admitting: Internal Medicine

## 2012-09-08 NOTE — Telephone Encounter (Signed)
New Problem:    Patient called in wanting to give you an update on how his BP is doing.  Please call back.

## 2012-09-09 NOTE — Telephone Encounter (Signed)
Follow-up:    Patient called in to follow-up on his previous call.  Please call back. Patient is aware that you were not in the office when this message was taken.

## 2012-09-10 NOTE — Telephone Encounter (Signed)
Pt called in to report b/p readings since starting Cozaar 25 daily.  States reading are between 115/77 to 120/85 with an occasional diastolic reading in the 90's.  Pt has Cozaar 50mg  tabs and is taking 1/2 tab daily.  Wants to know if he should start taking 50mg .

## 2012-09-16 NOTE — Telephone Encounter (Signed)
Pt notified and he agreed.

## 2012-09-16 NOTE — Telephone Encounter (Signed)
I would keep on 25 COzaar with those readings.

## 2012-10-01 ENCOUNTER — Ambulatory Visit (HOSPITAL_COMMUNITY)
Admission: RE | Admit: 2012-10-01 | Discharge: 2012-10-01 | Disposition: A | Discharge status: Home / Self Care | Payer: BC Managed Care – PPO | Source: Ambulatory Visit | Attending: Internal Medicine | Admitting: Internal Medicine

## 2012-10-01 ENCOUNTER — Other Ambulatory Visit (HOSPITAL_COMMUNITY): Payer: Self-pay | Admitting: Internal Medicine

## 2012-10-01 DIAGNOSIS — R05 Cough: Secondary | ICD-10-CM | POA: Insufficient documentation

## 2012-10-01 DIAGNOSIS — R918 Other nonspecific abnormal finding of lung field: Secondary | ICD-10-CM | POA: Insufficient documentation

## 2012-10-01 DIAGNOSIS — R059 Cough, unspecified: Secondary | ICD-10-CM | POA: Insufficient documentation

## 2012-10-01 DIAGNOSIS — J019 Acute sinusitis, unspecified: Secondary | ICD-10-CM

## 2012-10-01 DIAGNOSIS — R079 Chest pain, unspecified: Secondary | ICD-10-CM | POA: Insufficient documentation

## 2012-10-15 ENCOUNTER — Other Ambulatory Visit: Payer: Self-pay | Admitting: *Deleted

## 2012-10-15 ENCOUNTER — Telehealth: Payer: Self-pay | Admitting: Internal Medicine

## 2012-10-15 MED ORDER — LOSARTAN POTASSIUM 50 MG PO TABS: 50.0000 mg | ORAL_TABLET | Freq: Every day | ORAL | Status: DC

## 2012-10-15 NOTE — Telephone Encounter (Signed)
New problem   Pt have a question about his BP medication Losartan potassium, it is still getting higher. Please call pt concerning about this matter.

## 2012-10-15 NOTE — Telephone Encounter (Signed)
Pt notified to increase Losartan to whole pill.  Pt will call back with BP readings in 2-3 weeks.

## 2012-10-15 NOTE — Telephone Encounter (Signed)
Pt called to report that his blood pressure readings over the past 2 weeks have been 121-138/84-96.  Pt wants to know if he should increase his losartan to a whole pill instead of half.

## 2012-10-15 NOTE — Telephone Encounter (Signed)
Yes  Increase to whole pill

## 2012-12-18 ENCOUNTER — Other Ambulatory Visit: Payer: BC Managed Care – PPO

## 2013-01-06 ENCOUNTER — Telehealth: Payer: Self-pay | Admitting: Internal Medicine

## 2013-01-06 NOTE — Telephone Encounter (Signed)
Advised pt to take plain Mucinex.

## 2013-01-06 NOTE — Telephone Encounter (Signed)
New Problem:    Patient called in wanting to know if he can take medication for a sinus cold that he has.  Please call back.

## 2013-01-21 ENCOUNTER — Other Ambulatory Visit: Payer: Self-pay | Admitting: *Deleted

## 2013-01-21 MED ORDER — LOSARTAN POTASSIUM 50 MG PO TABS: 50.0000 mg | ORAL_TABLET | Freq: Every day | ORAL | Status: DC

## 2013-01-21 NOTE — Telephone Encounter (Signed)
Patient needs to have a 30 refill until insurance is able to cover 90 day for cost purpose.   Micki Riley, CMA

## 2013-03-25 ENCOUNTER — Ambulatory Visit: Payer: BC Managed Care – PPO | Admitting: Internal Medicine

## 2013-06-04 ENCOUNTER — Ambulatory Visit: Payer: BC Managed Care – PPO | Admitting: Internal Medicine

## 2013-09-16 ENCOUNTER — Other Ambulatory Visit: Payer: BC Managed Care – PPO

## 2013-09-16 ENCOUNTER — Ambulatory Visit: Payer: BC Managed Care – PPO | Admitting: Internal Medicine

## 2013-09-23 ENCOUNTER — Encounter: Payer: Self-pay | Admitting: Internal Medicine

## 2013-11-12 DIAGNOSIS — A770 Spotted fever due to Rickettsia rickettsii: Secondary | ICD-10-CM

## 2013-11-12 HISTORY — DX: Spotted fever due to Rickettsia rickettsii: A77.0

## 2013-11-17 ENCOUNTER — Other Ambulatory Visit: Payer: Self-pay | Admitting: Internal Medicine

## 2013-11-23 ENCOUNTER — Emergency Department (HOSPITAL_COMMUNITY)
Admission: EM | Admit: 2013-11-23 | Discharge: 2013-11-23 | Disposition: A | Discharge status: Home / Self Care | Payer: BC Managed Care – PPO | Attending: Emergency Medicine | Admitting: Emergency Medicine

## 2013-11-23 ENCOUNTER — Encounter (HOSPITAL_COMMUNITY): Payer: Self-pay | Admitting: Emergency Medicine

## 2013-11-23 DIAGNOSIS — R739 Hyperglycemia, unspecified: Secondary | ICD-10-CM

## 2013-11-23 DIAGNOSIS — R7309 Other abnormal glucose: Secondary | ICD-10-CM | POA: Insufficient documentation

## 2013-11-23 DIAGNOSIS — R197 Diarrhea, unspecified: Secondary | ICD-10-CM

## 2013-11-23 DIAGNOSIS — E860 Dehydration: Secondary | ICD-10-CM

## 2013-11-23 DIAGNOSIS — IMO0002 Reserved for concepts with insufficient information to code with codable children: Secondary | ICD-10-CM | POA: Insufficient documentation

## 2013-11-23 DIAGNOSIS — E785 Hyperlipidemia, unspecified: Secondary | ICD-10-CM | POA: Insufficient documentation

## 2013-11-23 DIAGNOSIS — Z79899 Other long term (current) drug therapy: Secondary | ICD-10-CM | POA: Insufficient documentation

## 2013-11-23 DIAGNOSIS — I10 Essential (primary) hypertension: Secondary | ICD-10-CM | POA: Insufficient documentation

## 2013-11-23 DIAGNOSIS — Z791 Long term (current) use of non-steroidal anti-inflammatories (NSAID): Secondary | ICD-10-CM | POA: Insufficient documentation

## 2013-11-23 DIAGNOSIS — R000 Tachycardia, unspecified: Secondary | ICD-10-CM | POA: Insufficient documentation

## 2013-11-23 LAB — CBC WITH DIFFERENTIAL/PLATELET
Basophils Absolute: 0 10*3/uL (ref 0.0–0.1)
Basophils Relative: 0 % (ref 0–1)
Eosinophils Absolute: 0 10*3/uL (ref 0.0–0.7)
Eosinophils Relative: 0 % (ref 0–5)
HCT: 48.4 % (ref 39.0–52.0)
Hemoglobin: 17 g/dL (ref 13.0–17.0)
Lymphocytes Relative: 17 % (ref 12–46)
Lymphs Abs: 1.3 10*3/uL (ref 0.7–4.0)
MCH: 34.3 pg — ABNORMAL HIGH (ref 26.0–34.0)
MCHC: 35.1 g/dL (ref 30.0–36.0)
MCV: 97.8 fL (ref 78.0–100.0)
Monocytes Absolute: 1 10*3/uL (ref 0.1–1.0)
Monocytes Relative: 13 % — ABNORMAL HIGH (ref 3–12)
Neutro Abs: 5.7 10*3/uL (ref 1.7–7.7)
Neutrophils Relative %: 70 % (ref 43–77)
Platelets: 281 10*3/uL (ref 150–400)
RBC: 4.95 MIL/uL (ref 4.22–5.81)
RDW: 13.7 % (ref 11.5–15.5)
WBC: 8 10*3/uL (ref 4.0–10.5)

## 2013-11-23 LAB — BASIC METABOLIC PANEL
BUN: 10 mg/dL (ref 6–23)
CO2: 19 mEq/L (ref 19–32)
Calcium: 9.3 mg/dL (ref 8.4–10.5)
Chloride: 96 mEq/L (ref 96–112)
Creatinine, Ser: 0.84 mg/dL (ref 0.50–1.35)
GFR calc Af Amer: >90 mL/min (ref >90)
GFR calc non Af Amer: >90 mL/min (ref >90)
Glucose, Bld: 199 mg/dL — ABNORMAL HIGH (ref 70–99)
Potassium: 3.5 mEq/L — ABNORMAL LOW (ref 3.7–5.3)
Sodium: 138 mEq/L (ref 137–147)

## 2013-11-23 MED ORDER — ALPRAZOLAM 0.5 MG PO TABS
0.5000 mg | ORAL_TABLET | Freq: Once | ORAL | Status: AC
  Administered 2013-11-23: 0.5 mg via ORAL
  Filled 2013-11-23: qty 1

## 2013-11-23 MED ORDER — SODIUM CHLORIDE 0.9 % IV BOLUS (SEPSIS)
1000.0000 mL | Freq: Once | INTRAVENOUS | Status: AC
  Administered 2013-11-23: 1000 mL via INTRAVENOUS

## 2013-11-23 MED ORDER — ONDANSETRON 4 MG PO TBDP
ORAL_TABLET | ORAL | Status: AC
  Administered 2013-11-23: 4 mg via ORAL
  Filled 2013-11-23: qty 1

## 2013-11-23 MED ORDER — ONDANSETRON 4 MG PO TBDP
4.0000 mg | ORAL_TABLET | Freq: Once | ORAL | Status: AC
  Administered 2013-11-23: 4 mg via ORAL

## 2013-11-23 MED ORDER — LOPERAMIDE HCL 2 MG PO CAPS: 2.0000 mg | ORAL_CAPSULE | Freq: Four times a day (QID) | ORAL | Status: DC | PRN

## 2013-11-23 MED ORDER — SODIUM CHLORIDE 0.9 % IV SOLN
Freq: Once | INTRAVENOUS | Status: AC
  Administered 2013-11-23: 08:00:00 via INTRAVENOUS

## 2013-11-23 NOTE — ED Provider Notes (Signed)
CSN: [id number]     Arrival date & time 11/23/13  0803 History  This chart was scribed for Merryl Hacker, MD by Eston Mould, ED Scribe. This patient was seen in room APA08/[facility]08 and the patient's care was started at 8:18 AM.   Chief Complaint  Patient presents with  . Diarrhea   The history is provided by the patient. No language interpreter was used.   HPI Comments: Matthew [name] is a 38 y.o. male who presents to the Emergency Department complaining of ongoing diarrhea and weakness for the past 3 days. Pt reports having recurrent episodes of diarrhea and states he has been feeling "clammy". States "he feels dehydrated". Reports drinking fluids but not eating compared to his normal. He states he is being treated for a tick bit that occurred 1 week ago and is taking Doxycycline for tx. Denies vomiting or abdominal pain.  States his 2 y.o son is sick with a URI but denies any other sick contacts. Reports being a social drinker but denies smoking. 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 Denies hematochezia, emesis, recent fevers, chills abd pain and dizziness.  Past Medical History  Diagnosis Date  . Complete transposition of great vessels   . Hypertension   . Dyslipidemia    Past Surgical History  Procedure Laterality Date  . Volar plate arthroplasty, right small finger proxima linterphalangeal joint    . Septostomy      rashkind  . Mustard procedure     Family History  Problem Relation Age of Onset  . Rheumatic fever Father   . Hypertension Father    History  Substance Use Topics  . Smoking status: Never Smoker   . Smokeless tobacco: Not on file  . Alcohol Use: Yes    Review of Systems  Constitutional: Negative.  Negative for fever and chills.  Respiratory: Negative.  Negative for chest tightness and shortness of breath.   Cardiovascular: Negative.  Negative for chest pain.  Gastrointestinal: Positive for diarrhea. Negative for nausea,  vomiting, abdominal pain and blood in stool.  Genitourinary: Negative.  Negative for dysuria.  Musculoskeletal: Negative for back pain.  Skin: Negative for rash.  Neurological: Negative for headaches.  All other systems reviewed and are negative.  Allergies  Aspirin  Home Medications   Prior to Admission medications   Medication Sig Start Date End Date Taking? Authorizing Provider  acetaminophen (TYLENOL) 325 MG tablet As needed    Historical Provider, MD  ALPRAZolam (XANAX) 0.5 MG tablet Take 0.5 mg by mouth at bedtime as needed.      Historical Provider, MD  atorvastatin (LIPITOR) 10 MG tablet Take 1 tablet (10 mg total) by mouth daily. 08/13/12   Fay Records, MD  Cimetidine (ACID REDUCER PO) Take by mouth.      Historical Provider, MD  fluticasone (FLONASE) 50 MCG/ACT nasal spray Place 2 sprays into the nose daily.    Historical Provider, MD  hydrOXYzine (ATARAX/VISTARIL) 25 MG tablet As needed for itching 03/04/11   Historical Provider, MD  ibuprofen (ADVIL,MOTRIN) 200 MG tablet Take 200 mg by mouth every 6 (six) hours as needed.      Historical Provider, MD  losartan (COZAAR) 50 MG tablet TAKE ONE TABLET BY MOUTH ONCE DAILY    Fay Records, MD  sodium chloride (OCEAN) 0.65 % SOLN nasal spray Place 1 spray into the nose as needed.    Historical Provider, MD   BP 159/91  Pulse 109  Temp(Src) 97.8 F (36.6 C) (Oral)  Resp 17  Ht 6\' 3"  (1.905 m)  Wt 208 lb (94.348 kg)  BMI 26.00 kg/m2  SpO2 97%  Physical Exam  Nursing note and vitals reviewed. Constitutional: He is oriented to person, place, and time. He appears well-developed and well-nourished. No distress.  HENT:  Head: Normocephalic and atraumatic.  MM dry  Eyes: Pupils are equal, round, and reactive to light.  Neck: Neck supple.  Cardiovascular: Regular rhythm and normal heart sounds.   No murmur heard. Tachycardia  Pulmonary/Chest: Effort normal and breath sounds normal. No respiratory distress. He has no wheezes.   Abdominal: Soft. Bowel sounds are normal. He exhibits no distension. There is no tenderness. There is no rebound and no guarding.  Musculoskeletal: He exhibits no edema.  Lymphadenopathy:    He has no cervical adenopathy.  Neurological: He is alert and oriented to person, place, and time.  Skin: Skin is warm and dry.  Psychiatric: He has a normal mood and affect.    ED Course  Procedures  DIAGNOSTIC STUDIES: Oxygen Saturation is 96% on RA, normal by my interpretation.    COORDINATION OF CARE: 8:20 AM-Discussed treatment plan which includes labs, begin IV fluids and administer Zofran while in the ED. Pt agreed to plan.   Labs Review Labs Reviewed  CBC WITH DIFFERENTIAL - Abnormal; Notable for the following:    MCH 34.3 (*)    Monocytes Relative 13 (*)    All other components within normal limits  BASIC METABOLIC PANEL - Abnormal; Notable for the following:    Potassium 3.5 (*)    Glucose, Bld 199 (*)    All other components within normal limits    Imaging Review No results found.   EKG Interpretation None     \\ EKG independently reviewed by myself: Sinus tachycardia with rate of 104, T wave inversions noted in the anterior and lateral leads which were present on prior EKG  MDM   Final diagnoses:  Diarrhea  Dehydration    Vision presents with 3 days of diarrhea. Denies abdominal pain or vomiting. He does appear dry exam and has a heart rate of 130. IV was established the patient was given fluids and Zofran. Basic labwork was obtained and reassuring. Patient was noted to have a glucose of 199. No known history of diabetes. Informed patient of this result and he will need to followup with his primary physician.  Given history of congenital heart disease, obtain EKG given tachycardia. EKG unchanged from prior. Patient improved with 2 L of normal saline. He is well-appearing on repeat exam and is tolerating oral intake. Diarrhea likely secondary to viral etiology. Doxycycline  has low risk for C. difficile. Patient will follow up with PCP.  After history, exam, and medical workup I feel the patient has been appropriately medically screened and is safe for discharge home. Pertinent diagnoses were discussed with the patient. Patient was given return precautions.  I personally performed the services described in this documentation, which was scribed in my presence. The recorded information has been reviewed and is accurate.    Merryl Hacker, MD 11/23/13 [phone]

## 2013-11-23 NOTE — Discharge Instructions (Signed)
Dehydration, Adult Dehydration is when you lose more fluids from the body than you take in. Vital organs like the kidneys, brain, and heart cannot function without a proper amount of fluids and salt. Any loss of fluids from the body can cause dehydration.  CAUSES   Vomiting.  Diarrhea.  Excessive sweating.  Excessive urine output.  Fever. SYMPTOMS  Mild dehydration  Thirst.  Dry lips.  Slightly dry mouth. Moderate dehydration  Very dry mouth.  Sunken eyes.  Skin does not bounce back quickly when lightly pinched and released.  Dark urine and decreased urine production.  Decreased tear production.  Headache. Severe dehydration  Very dry mouth.  Extreme thirst.  Rapid, weak pulse (more than 100 beats per minute at rest).  Cold hands and feet.  Not able to sweat in spite of heat and temperature.  Rapid breathing.  Blue lips.  Confusion and lethargy.  Difficulty being awakened.  Minimal urine production.  No tears. DIAGNOSIS  Your caregiver will diagnose dehydration based on your symptoms and your exam. Blood and urine tests will help confirm the diagnosis. The diagnostic evaluation should also identify the cause of dehydration. TREATMENT  Treatment of mild or moderate dehydration can often be done at home by increasing the amount of fluids that you drink. It is best to drink small amounts of fluid more often. Drinking too much at one time can make vomiting worse. Refer to the home care instructions below. Severe dehydration needs to be treated at the hospital where you will probably be given intravenous (IV) fluids that contain water and electrolytes. HOME CARE INSTRUCTIONS   Ask your caregiver about specific rehydration instructions.  Drink enough fluids to keep your urine clear or pale yellow.  Drink small amounts frequently if you have nausea and vomiting.  Eat as you normally do.  Avoid:  Foods or drinks high in sugar.  Carbonated  drinks.  Juice.  Extremely hot or cold fluids.  Drinks with caffeine.  Fatty, greasy foods.  Alcohol.  Tobacco.  Overeating.  Gelatin desserts.  Wash your hands well to avoid spreading bacteria and viruses.  Only take over-the-counter or prescription medicines for pain, discomfort, or fever as directed by your caregiver.  Ask your caregiver if you should continue all prescribed and over-the-counter medicines.  Keep all follow-up appointments with your caregiver. SEEK MEDICAL CARE IF:  You have abdominal pain and it increases or stays in one area (localizes).  You have a rash, stiff neck, or severe headache.  You are irritable, sleepy, or difficult to awaken.  You are weak, dizzy, or extremely thirsty. SEEK IMMEDIATE MEDICAL CARE IF:   You are unable to keep fluids down or you get worse despite treatment.  You have frequent episodes of vomiting or diarrhea.  You have blood or green matter (bile) in your vomit.  You have blood in your stool or your stool looks black and tarry.  You have not urinated in 6 to 8 hours, or you have only urinated a small amount of very dark urine.  You have a fever.  You faint. MAKE SURE YOU:   Understand these instructions.  Will watch your condition.  Will get help right away if you are not doing well or get worse. Document Released: 07/01/2005 Document Revised: 09/23/2011 Document Reviewed: 02/18/2011 [facility] Patient Information 2014 [place], [place]. Diarrhea Diarrhea is frequent loose and watery bowel movements. It can cause you to feel weak and dehydrated. Dehydration can cause you to become tired and thirsty, have  a dry mouth, and have decreased urination that often is dark yellow. Diarrhea is a sign of another problem, most often an infection that will not last long. In most cases, diarrhea typically lasts 2 3 days. However, it can last longer if it is a sign of something more serious. It is important to treat your diarrhea  as directed by your caregive to lessen or prevent future episodes of diarrhea. CAUSES  Some common causes include:  Gastrointestinal infections caused by viruses, bacteria, or parasites.  Food poisoning or food allergies.  Certain medicines, such as antibiotics, chemotherapy, and laxatives.  Artificial sweeteners and fructose.  Digestive disorders. HOME CARE INSTRUCTIONS  Ensure adequate fluid intake (hydration): have 1 cup (8 oz) of fluid for each diarrhea episode. Avoid fluids that contain simple sugars or sports drinks, fruit juices, whole milk products, and sodas. Your urine should be clear or pale yellow if you are drinking enough fluids. Hydrate with an oral rehydration solution that you can purchase at pharmacies, retail stores, and online. You can prepare an oral rehydration solution at home by mixing the following ingredients together:    tsp table salt.   tsp baking soda.   tsp salt substitute containing potassium chloride.  1  tablespoons sugar.  1 L (34 oz) of water.  Certain foods and beverages may increase the speed at which food moves through the gastrointestinal (GI) tract. These foods and beverages should be avoided and include:  Caffeinated and alcoholic beverages.  High-fiber foods, such as raw fruits and vegetables, nuts, seeds, and whole grain breads and cereals.  Foods and beverages sweetened with sugar alcohols, such as xylitol, sorbitol, and mannitol.  Some foods may be well tolerated and may help thicken stool including:  Starchy foods, such as rice, toast, pasta, low-sugar cereal, oatmeal, grits, baked potatoes, crackers, and bagels.  Bananas.  Applesauce.  Add probiotic-rich foods to help increase healthy bacteria in the GI tract, such as yogurt and fermented milk products.  Wash your hands well after each diarrhea episode.  Only take over-the-counter or prescription medicines as directed by your caregiver.  Take a warm bath to relieve any  burning or pain from frequent diarrhea episodes. SEEK IMMEDIATE MEDICAL CARE IF:   You are unable to keep fluids down.  You have persistent vomiting.  You have blood in your stool, or your stools are black and tarry.  You do not urinate in 6 8 hours, or there is only a small amount of very dark urine.  You have abdominal pain that increases or localizes.  You have weakness, dizziness, confusion, or lightheadedness.  You have a severe headache.  Your diarrhea gets worse or does not get better.  You have a fever or persistent symptoms for more than 2 3 days.  You have a fever and your symptoms suddenly get worse. MAKE SURE YOU:   Understand these instructions.  Will watch your condition.  Will get help right away if you are not doing well or get worse. Document Released: 06/21/2002 Document Revised: 06/17/2012 Document Reviewed: 03/08/2012 [facility] Patient Information 2014 [place], [place]. Hyperglycemia Hyperglycemia occurs when the glucose (sugar) in your blood is too high. Hyperglycemia can happen for many reasons, but it most often happens to people who do not know they have diabetes or are not managing their diabetes properly.  CAUSES  Whether you have diabetes or not, there are other causes of hyperglycemia. Hyperglycemia can occur when you have diabetes, but it can also occur in other situations  that you might not be as aware of, such as: Diabetes  If you have diabetes and are having problems controlling your blood glucose, hyperglycemia could occur because of some of the following reasons:  Not following your meal plan.  Not taking your diabetes medications or not taking it properly.  Exercising less or doing less activity than you normally do.  Being sick. Pre-diabetes  This cannot be ignored. Before people develop Type 2 diabetes, they almost always have "pre-diabetes." This is when your blood glucose levels are higher than normal, but not yet high enough to be  diagnosed as diabetes. Research has shown that some long-term damage to the body, especially the heart and circulatory system, may already be occurring during pre-diabetes. If you take action to manage your blood glucose when you have pre-diabetes, you may delay or prevent Type 2 diabetes from developing. Stress  If you have diabetes, you may be "diet" controlled or on oral medications or insulin to control your diabetes. However, you may find that your blood glucose is higher than usual in the hospital whether you have diabetes or not. This is often referred to as "stress hyperglycemia." Stress can elevate your blood glucose. This happens because of hormones put out by the body during times of stress. If stress has been the cause of your high blood glucose, it can be followed regularly by your caregiver. That way he/she can make sure your hyperglycemia does not continue to get worse or progress to diabetes. Steroids  Steroids are medications that act on the infection fighting system (immune system) to block inflammation or infection. One side effect can be a rise in blood glucose. Most people can produce enough extra insulin to allow for this rise, but for those who cannot, steroids make blood glucose levels go even higher. It is not unusual for steroid treatments to "uncover" diabetes that is developing. It is not always possible to determine if the hyperglycemia will go away after the steroids are stopped. A special blood test called an A1c is sometimes done to determine if your blood glucose was elevated before the steroids were started. SYMPTOMS  Thirsty.  Frequent urination.  Dry mouth.  Blurred vision.  Tired or fatigue.  Weakness.  Sleepy.  Tingling in feet or leg. DIAGNOSIS  Diagnosis is made by monitoring blood glucose in one or all of the following ways:  A1c test. This is a chemical found in your blood.  Fingerstick blood glucose monitoring.  Laboratory results. TREATMENT    First, knowing the cause of the hyperglycemia is important before the hyperglycemia can be treated. Treatment may include, but is not be limited to:  Education.  Change or adjustment in medications.  Change or adjustment in meal plan.  Treatment for an illness, infection, etc.  More frequent blood glucose monitoring.  Change in exercise plan.  Decreasing or stopping steroids.  Lifestyle changes. HOME CARE INSTRUCTIONS   Test your blood glucose as directed.  Exercise regularly. Your caregiver will give you instructions about exercise. Pre-diabetes or diabetes which comes on with stress is helped by exercising.  Eat wholesome, balanced meals. Eat often and at regular, fixed times. Your caregiver or nutritionist will give you a meal plan to guide your sugar intake.  Being at an ideal weight is important. If needed, losing as little as 10 to 15 pounds may help improve blood glucose levels. SEEK MEDICAL CARE IF:   You have questions about medicine, activity, or diet.  You continue to have symptoms (  problems such as increased thirst, urination, or weight gain). SEEK IMMEDIATE MEDICAL CARE IF:   You are vomiting or have diarrhea.  Your breath smells fruity.  You are breathing faster or slower.  You are very sleepy or incoherent.  You have numbness, tingling, or pain in your feet or hands.  You have chest pain.  Your symptoms get worse even though you have been following your caregiver's orders.  If you have any other questions or concerns. Document Released: 12/25/2000 Document Revised: 09/23/2011 Document Reviewed: 10/28/2011 [facility] Patient Information 2014 [place], [place].

## 2013-11-23 NOTE — ED Notes (Signed)
Diarrhea for 3 days.  Being treated for a tick bite with doxycycline.

## 2013-12-29 ENCOUNTER — Emergency Department (HOSPITAL_COMMUNITY): Payer: BC Managed Care – PPO

## 2013-12-29 ENCOUNTER — Inpatient Hospital Stay (HOSPITAL_COMMUNITY)
Admission: EM | Admit: 2013-12-29 | Discharge: 2014-01-05 | DRG: 308 | Disposition: A | Discharge status: Home / Self Care | Payer: BC Managed Care – PPO | Attending: Internal Medicine | Admitting: Internal Medicine

## 2013-12-29 ENCOUNTER — Encounter (HOSPITAL_COMMUNITY): Payer: Self-pay | Admitting: Emergency Medicine

## 2013-12-29 DIAGNOSIS — I509 Heart failure, unspecified: Secondary | ICD-10-CM | POA: Diagnosis present

## 2013-12-29 DIAGNOSIS — D45 Polycythemia vera: Secondary | ICD-10-CM | POA: Diagnosis present

## 2013-12-29 DIAGNOSIS — I5022 Chronic systolic (congestive) heart failure: Secondary | ICD-10-CM | POA: Diagnosis present

## 2013-12-29 DIAGNOSIS — K7010 Alcoholic hepatitis without ascites: Secondary | ICD-10-CM | POA: Diagnosis present

## 2013-12-29 DIAGNOSIS — Z79899 Other long term (current) drug therapy: Secondary | ICD-10-CM

## 2013-12-29 DIAGNOSIS — M6289 Other specified disorders of muscle: Secondary | ICD-10-CM

## 2013-12-29 DIAGNOSIS — I272 Pulmonary hypertension, unspecified: Secondary | ICD-10-CM | POA: Diagnosis present

## 2013-12-29 DIAGNOSIS — F411 Generalized anxiety disorder: Secondary | ICD-10-CM | POA: Diagnosis present

## 2013-12-29 DIAGNOSIS — R197 Diarrhea, unspecified: Secondary | ICD-10-CM

## 2013-12-29 DIAGNOSIS — I10 Essential (primary) hypertension: Secondary | ICD-10-CM | POA: Diagnosis present

## 2013-12-29 DIAGNOSIS — I4892 Unspecified atrial flutter: Secondary | ICD-10-CM | POA: Diagnosis present

## 2013-12-29 DIAGNOSIS — F332 Major depressive disorder, recurrent severe without psychotic features: Secondary | ICD-10-CM | POA: Diagnosis present

## 2013-12-29 DIAGNOSIS — IMO0002 Reserved for concepts with insufficient information to code with codable children: Secondary | ICD-10-CM

## 2013-12-29 DIAGNOSIS — K921 Melena: Secondary | ICD-10-CM | POA: Diagnosis present

## 2013-12-29 DIAGNOSIS — F10939 Alcohol use, unspecified with withdrawal, unspecified: Secondary | ICD-10-CM | POA: Diagnosis present

## 2013-12-29 DIAGNOSIS — E860 Dehydration: Secondary | ICD-10-CM | POA: Diagnosis present

## 2013-12-29 DIAGNOSIS — I471 Supraventricular tachycardia, unspecified: Secondary | ICD-10-CM | POA: Diagnosis present

## 2013-12-29 DIAGNOSIS — E871 Hypo-osmolality and hyponatremia: Secondary | ICD-10-CM | POA: Diagnosis present

## 2013-12-29 DIAGNOSIS — R000 Tachycardia, unspecified: Secondary | ICD-10-CM

## 2013-12-29 DIAGNOSIS — E782 Mixed hyperlipidemia: Secondary | ICD-10-CM | POA: Diagnosis present

## 2013-12-29 DIAGNOSIS — M629 Disorder of muscle, unspecified: Secondary | ICD-10-CM

## 2013-12-29 DIAGNOSIS — Z23 Encounter for immunization: Secondary | ICD-10-CM

## 2013-12-29 DIAGNOSIS — F329 Major depressive disorder, single episode, unspecified: Secondary | ICD-10-CM | POA: Diagnosis present

## 2013-12-29 DIAGNOSIS — Q203 Discordant ventriculoarterial connection: Secondary | ICD-10-CM

## 2013-12-29 DIAGNOSIS — F1010 Alcohol abuse, uncomplicated: Secondary | ICD-10-CM | POA: Diagnosis present

## 2013-12-29 DIAGNOSIS — F10231 Alcohol dependence with withdrawal delirium: Secondary | ICD-10-CM

## 2013-12-29 DIAGNOSIS — I484 Atypical atrial flutter: Secondary | ICD-10-CM

## 2013-12-29 DIAGNOSIS — E43 Unspecified severe protein-calorie malnutrition: Secondary | ICD-10-CM

## 2013-12-29 DIAGNOSIS — E872 Acidosis, unspecified: Secondary | ICD-10-CM

## 2013-12-29 DIAGNOSIS — R7401 Elevation of levels of liver transaminase levels: Secondary | ICD-10-CM | POA: Diagnosis present

## 2013-12-29 DIAGNOSIS — R7309 Other abnormal glucose: Secondary | ICD-10-CM | POA: Diagnosis present

## 2013-12-29 DIAGNOSIS — F10931 Alcohol use, unspecified with withdrawal delirium: Secondary | ICD-10-CM

## 2013-12-29 DIAGNOSIS — F10239 Alcohol dependence with withdrawal, unspecified: Secondary | ICD-10-CM

## 2013-12-29 DIAGNOSIS — I4891 Unspecified atrial fibrillation: Principal | ICD-10-CM

## 2013-12-29 DIAGNOSIS — F1020 Alcohol dependence, uncomplicated: Secondary | ICD-10-CM | POA: Diagnosis present

## 2013-12-29 DIAGNOSIS — I498 Other specified cardiac arrhythmias: Secondary | ICD-10-CM | POA: Diagnosis present

## 2013-12-29 DIAGNOSIS — R1110 Vomiting, unspecified: Secondary | ICD-10-CM

## 2013-12-29 DIAGNOSIS — E785 Hyperlipidemia, unspecified: Secondary | ICD-10-CM | POA: Diagnosis present

## 2013-12-29 DIAGNOSIS — F32A Depression, unspecified: Secondary | ICD-10-CM

## 2013-12-29 DIAGNOSIS — R112 Nausea with vomiting, unspecified: Secondary | ICD-10-CM | POA: Diagnosis present

## 2013-12-29 DIAGNOSIS — R443 Hallucinations, unspecified: Secondary | ICD-10-CM

## 2013-12-29 DIAGNOSIS — I2789 Other specified pulmonary heart diseases: Secondary | ICD-10-CM | POA: Diagnosis present

## 2013-12-29 DIAGNOSIS — R739 Hyperglycemia, unspecified: Secondary | ICD-10-CM | POA: Diagnosis present

## 2013-12-29 DIAGNOSIS — E876 Hypokalemia: Secondary | ICD-10-CM | POA: Diagnosis present

## 2013-12-29 DIAGNOSIS — R740 Nonspecific elevation of levels of transaminase and lactic acid dehydrogenase [LDH]: Secondary | ICD-10-CM

## 2013-12-29 HISTORY — DX: Alcohol abuse, uncomplicated: F10.10

## 2013-12-29 HISTORY — DX: Depression, unspecified: F32.A

## 2013-12-29 HISTORY — DX: Major depressive disorder, single episode, unspecified: F32.9

## 2013-12-29 HISTORY — DX: Spotted fever due to Rickettsia rickettsii: A77.0

## 2013-12-29 LAB — BLOOD GAS, ARTERIAL
Acid-Base Excess: 1 mmol/L (ref 0.0–2.0)
Bicarbonate: 25 mEq/L — ABNORMAL HIGH (ref 20.0–24.0)
Drawn by: 22223
O2 Content: 3 L/min
O2 Saturation: 97.5 %
Patient temperature: 37
TCO2: 21.2 mmol/L (ref 0–100)
pCO2 arterial: 39.4 mmHg (ref 35.0–45.0)
pH, Arterial: 7.42 (ref 7.350–7.450)
pO2, Arterial: 101 mmHg — ABNORMAL HIGH (ref 80.0–100.0)

## 2013-12-29 LAB — COMPREHENSIVE METABOLIC PANEL
ALT: 188 U/L — ABNORMAL HIGH (ref 0–53)
AST: 175 U/L — ABNORMAL HIGH (ref 0–37)
Albumin: 3.4 g/dL — ABNORMAL LOW (ref 3.5–5.2)
Alkaline Phosphatase: 70 U/L (ref 39–117)
BUN: 10 mg/dL (ref 6–23)
CO2: 23 mEq/L (ref 19–32)
Calcium: 9.1 mg/dL (ref 8.4–10.5)
Chloride: 82 mEq/L — ABNORMAL LOW (ref 96–112)
Creatinine, Ser: 0.9 mg/dL (ref 0.50–1.35)
GFR calc Af Amer: >90 mL/min (ref >90)
GFR calc non Af Amer: >90 mL/min (ref >90)
Glucose, Bld: 186 mg/dL — ABNORMAL HIGH (ref 70–99)
Potassium: 3 mEq/L — ABNORMAL LOW (ref 3.7–5.3)
Sodium: 127 mEq/L — ABNORMAL LOW (ref 137–147)
Total Bilirubin: 1.4 mg/dL — ABNORMAL HIGH (ref 0.3–1.2)
Total Protein: 6.3 g/dL (ref 6.0–8.3)

## 2013-12-29 LAB — TROPONIN I: Troponin I: <0.3 ng/mL (ref <0.30)

## 2013-12-29 LAB — URINALYSIS, ROUTINE W REFLEX MICROSCOPIC
Glucose, UA: NEGATIVE mg/dL
Ketones, ur: 15 mg/dL — AB
Leukocytes, UA: NEGATIVE
Nitrite: NEGATIVE
Protein, ur: 100 mg/dL — AB
Specific Gravity, Urine: 1.015 (ref 1.005–1.030)
Urobilinogen, UA: 1 mg/dL (ref 0.0–1.0)
pH: 7 (ref 5.0–8.0)

## 2013-12-29 LAB — CBC WITH DIFFERENTIAL/PLATELET
Basophils Absolute: 0 10*3/uL (ref 0.0–0.1)
Basophils Relative: 0 % (ref 0–1)
Eosinophils Absolute: 0.1 10*3/uL (ref 0.0–0.7)
Eosinophils Relative: 0 % (ref 0–5)
HCT: 47.1 % (ref 39.0–52.0)
Hemoglobin: 17.4 g/dL — ABNORMAL HIGH (ref 13.0–17.0)
Lymphocytes Relative: 10 % — ABNORMAL LOW (ref 12–46)
Lymphs Abs: 1.1 10*3/uL (ref 0.7–4.0)
MCH: 34.7 pg — ABNORMAL HIGH (ref 26.0–34.0)
MCHC: 36.9 g/dL — ABNORMAL HIGH (ref 30.0–36.0)
MCV: 93.8 fL (ref 78.0–100.0)
Monocytes Absolute: 1.5 10*3/uL — ABNORMAL HIGH (ref 0.1–1.0)
Monocytes Relative: 12 % (ref 3–12)
Neutro Abs: 9.4 10*3/uL — ABNORMAL HIGH (ref 1.7–7.7)
Neutrophils Relative %: 78 % — ABNORMAL HIGH (ref 43–77)
Platelets: 233 10*3/uL (ref 150–400)
RBC: 5.02 MIL/uL (ref 4.22–5.81)
RDW: 13.6 % (ref 11.5–15.5)
WBC: 12 10*3/uL — ABNORMAL HIGH (ref 4.0–10.5)

## 2013-12-29 LAB — RAPID URINE DRUG SCREEN, HOSP PERFORMED
Amphetamines: NOT DETECTED
Barbiturates: NOT DETECTED
Benzodiazepines: POSITIVE — AB
Cocaine: NOT DETECTED
Opiates: NOT DETECTED
Tetrahydrocannabinol: NOT DETECTED

## 2013-12-29 LAB — I-STAT CHEM 8, ED
BUN: 8 mg/dL (ref 6–23)
Calcium, Ion: 1.07 mmol/L — ABNORMAL LOW (ref 1.12–1.23)
Chloride: 87 mEq/L — ABNORMAL LOW (ref 96–112)
Creatinine, Ser: 0.9 mg/dL (ref 0.50–1.35)
Glucose, Bld: 185 mg/dL — ABNORMAL HIGH (ref 70–99)
HCT: 53 % — ABNORMAL HIGH (ref 39.0–52.0)
Hemoglobin: 18 g/dL — ABNORMAL HIGH (ref 13.0–17.0)
Potassium: 2.5 mEq/L — CL (ref 3.7–5.3)
Sodium: 124 mEq/L — ABNORMAL LOW (ref 137–147)
TCO2: 20 mmol/L (ref 0–100)

## 2013-12-29 LAB — URINE MICROSCOPIC-ADD ON

## 2013-12-29 LAB — PROTIME-INR
INR: 0.95 (ref 0.00–1.49)
Prothrombin Time: 12.5 seconds (ref 11.6–15.2)

## 2013-12-29 LAB — I-STAT TROPONIN, ED: Troponin i, poc: 0 ng/mL (ref 0.00–0.08)

## 2013-12-29 LAB — I-STAT CG4 LACTIC ACID, ED
Lactic Acid, Venous: 2.1 mmol/L (ref 0.5–2.2)
Lactic Acid, Venous: 3.3 mmol/L — ABNORMAL HIGH (ref 0.5–2.2)

## 2013-12-29 LAB — MAGNESIUM: Magnesium: 1.1 mg/dL — ABNORMAL LOW (ref 1.5–2.5)

## 2013-12-29 LAB — LIPASE, BLOOD: Lipase: 46 U/L (ref 11–59)

## 2013-12-29 MED ORDER — ADENOSINE 6 MG/2ML IV SOLN
INTRAVENOUS | Status: AC
  Administered 2013-12-29: 6 mg
  Filled 2013-12-29: qty 2

## 2013-12-29 MED ORDER — BUSPIRONE HCL 5 MG PO TABS
ORAL_TABLET | ORAL | Status: AC
  Filled 2013-12-29: qty 3

## 2013-12-29 MED ORDER — THIAMINE HCL 100 MG/ML IJ SOLN
100.0000 mg | Freq: Every day | INTRAMUSCULAR | Status: DC
  Filled 2013-12-29: qty 1
  Filled 2013-12-29: qty 2
  Filled 2013-12-29: qty 1

## 2013-12-29 MED ORDER — POTASSIUM CHLORIDE 10 MEQ/100ML IV SOLN
10.0000 meq | Freq: Once | INTRAVENOUS | Status: AC
  Administered 2013-12-29: 10 meq via INTRAVENOUS
  Filled 2013-12-29: qty 100

## 2013-12-29 MED ORDER — HEPARIN SODIUM (PORCINE) 5000 UNIT/ML IJ SOLN
5000.0000 [IU] | Freq: Three times a day (TID) | INTRAMUSCULAR | Status: DC
  Administered 2013-12-29: 5000 [IU] via SUBCUTANEOUS
  Filled 2013-12-29 (×6): qty 1

## 2013-12-29 MED ORDER — MORPHINE SULFATE 2 MG/ML IJ SOLN: 2.0000 mg | INTRAMUSCULAR | Status: DC | PRN

## 2013-12-29 MED ORDER — ONDANSETRON HCL 4 MG/2ML IJ SOLN
INTRAMUSCULAR | Status: AC
  Administered 2013-12-29: 4 mg via INTRAVENOUS
  Filled 2013-12-29: qty 2

## 2013-12-29 MED ORDER — MAGNESIUM SULFATE 40 MG/ML IJ SOLN
INTRAMUSCULAR | Status: AC
  Administered 2013-12-29: 2 g
  Filled 2013-12-29: qty 50

## 2013-12-29 MED ORDER — DILTIAZEM HCL 25 MG/5ML IV SOLN
10.0000 mg | Freq: Once | INTRAVENOUS | Status: AC
  Administered 2013-12-29: 10 mg via INTRAVENOUS

## 2013-12-29 MED ORDER — ONDANSETRON HCL 4 MG/2ML IJ SOLN: 4.0000 mg | Freq: Four times a day (QID) | INTRAMUSCULAR | Status: DC | PRN

## 2013-12-29 MED ORDER — PANTOPRAZOLE SODIUM 40 MG IV SOLR
40.0000 mg | INTRAVENOUS | Status: AC
  Administered 2013-12-29: 40 mg via INTRAVENOUS
  Filled 2013-12-29: qty 40

## 2013-12-29 MED ORDER — ADENOSINE 6 MG/2ML IV SOLN
INTRAVENOUS | Status: AC
  Administered 2013-12-29: 12 mg
  Filled 2013-12-29: qty 4

## 2013-12-29 MED ORDER — VITAMIN B-1 100 MG PO TABS
100.0000 mg | ORAL_TABLET | Freq: Every day | ORAL | Status: DC
  Administered 2013-12-29 – 2014-01-05 (×8): 100 mg via ORAL
  Filled 2013-12-29 (×9): qty 1

## 2013-12-29 MED ORDER — ONDANSETRON HCL 4 MG/2ML IJ SOLN
4.0000 mg | Freq: Once | INTRAMUSCULAR | Status: AC
  Administered 2013-12-29: 4 mg via INTRAVENOUS

## 2013-12-29 MED ORDER — ACETAMINOPHEN 325 MG PO TABS
650.0000 mg | ORAL_TABLET | Freq: Four times a day (QID) | ORAL | Status: DC | PRN
  Administered 2013-12-30 – 2014-01-04 (×7): 650 mg via ORAL
  Filled 2013-12-29 (×7): qty 2

## 2013-12-29 MED ORDER — LORAZEPAM 2 MG/ML IJ SOLN
0.0000 mg | Freq: Two times a day (BID) | INTRAMUSCULAR | Status: DC
  Administered 2013-12-31 – 2014-01-01 (×2): 2 mg via INTRAVENOUS
  Filled 2013-12-29 (×2): qty 1

## 2013-12-29 MED ORDER — DILTIAZEM HCL 100 MG IV SOLR
5.0000 mg/h | Freq: Once | INTRAVENOUS | Status: AC
  Administered 2013-12-29: 5 mg/h via INTRAVENOUS

## 2013-12-29 MED ORDER — BUSPIRONE HCL 15 MG PO TABS
15.0000 mg | ORAL_TABLET | Freq: Two times a day (BID) | ORAL | Status: DC
  Administered 2013-12-29 – 2014-01-05 (×14): 15 mg via ORAL
  Filled 2013-12-29 (×18): qty 1

## 2013-12-29 MED ORDER — SODIUM CHLORIDE 0.9 % IV BOLUS (SEPSIS)
1000.0000 mL | Freq: Once | INTRAVENOUS | Status: AC
  Administered 2013-12-29: 1000 mL via INTRAVENOUS

## 2013-12-29 MED ORDER — LORAZEPAM 2 MG/ML IJ SOLN
1.0000 mg | Freq: Four times a day (QID) | INTRAMUSCULAR | Status: AC | PRN
  Administered 2013-12-29: 1 mg via INTRAVENOUS

## 2013-12-29 MED ORDER — POTASSIUM CHLORIDE IN NACL 40-0.9 MEQ/L-% IV SOLN
INTRAVENOUS | Status: AC
  Filled 2013-12-29: qty 2000

## 2013-12-29 MED ORDER — FOLIC ACID 1 MG PO TABS
1.0000 mg | ORAL_TABLET | Freq: Every day | ORAL | Status: DC
  Administered 2013-12-29 – 2014-01-05 (×8): 1 mg via ORAL
  Filled 2013-12-29 (×9): qty 1

## 2013-12-29 MED ORDER — LORAZEPAM 2 MG/ML IJ SOLN
0.0000 mg | Freq: Four times a day (QID) | INTRAMUSCULAR | Status: AC
  Administered 2013-12-30 – 2013-12-31 (×3): 2 mg via INTRAVENOUS
  Filled 2013-12-29 (×4): qty 1

## 2013-12-29 MED ORDER — ADULT MULTIVITAMIN W/MINERALS CH
1.0000 | ORAL_TABLET | Freq: Every day | ORAL | Status: DC
  Administered 2013-12-29 – 2014-01-05 (×8): 1 via ORAL
  Filled 2013-12-29 (×9): qty 1

## 2013-12-29 MED ORDER — SODIUM CHLORIDE 0.9 % IV SOLN: INTRAVENOUS | Status: DC

## 2013-12-29 MED ORDER — MAGNESIUM SULFATE 50 % IJ SOLN
2.0000 g | INTRAVENOUS | Status: DC
  Filled 2013-12-29: qty 4

## 2013-12-29 MED ORDER — DILTIAZEM HCL 25 MG/5ML IV SOLN
INTRAVENOUS | Status: AC
  Administered 2013-12-29: 10 mg
  Filled 2013-12-29: qty 5

## 2013-12-29 MED ORDER — PANTOPRAZOLE SODIUM 40 MG IV SOLR
40.0000 mg | Freq: Two times a day (BID) | INTRAVENOUS | Status: DC
  Administered 2013-12-30 (×2): 40 mg via INTRAVENOUS
  Filled 2013-12-29 (×5): qty 40

## 2013-12-29 MED ORDER — ACETAMINOPHEN 650 MG RE SUPP: 650.0000 mg | Freq: Four times a day (QID) | RECTAL | Status: DC | PRN

## 2013-12-29 MED ORDER — LORAZEPAM 1 MG PO TABS
1.0000 mg | ORAL_TABLET | Freq: Four times a day (QID) | ORAL | Status: AC | PRN
  Administered 2014-01-01 (×2): 1 mg via ORAL
  Filled 2013-12-29 (×2): qty 1

## 2013-12-29 MED ORDER — LORAZEPAM 2 MG/ML IJ SOLN
1.0000 mg | Freq: Once | INTRAMUSCULAR | Status: AC
  Administered 2013-12-29: 1 mg via INTRAVENOUS
  Filled 2013-12-29: qty 1

## 2013-12-29 MED ORDER — LEVALBUTEROL HCL 0.63 MG/3ML IN NEBU
0.6300 mg | INHALATION_SOLUTION | Freq: Four times a day (QID) | RESPIRATORY_TRACT | Status: DC | PRN
  Filled 2013-12-29: qty 3

## 2013-12-29 MED ORDER — INSULIN ASPART 100 UNIT/ML ~~LOC~~ SOLN
0.0000 [IU] | SUBCUTANEOUS | Status: DC
  Administered 2013-12-30: 2 [IU] via SUBCUTANEOUS

## 2013-12-29 MED ORDER — ONDANSETRON HCL 4 MG PO TABS: 4.0000 mg | ORAL_TABLET | Freq: Four times a day (QID) | ORAL | Status: DC | PRN

## 2013-12-29 MED ORDER — LORAZEPAM 2 MG/ML IJ SOLN
1.0000 mg | Freq: Once | INTRAMUSCULAR | Status: AC
  Administered 2013-12-29: 1 mg via INTRAVENOUS

## 2013-12-29 MED ORDER — LORAZEPAM 2 MG/ML IJ SOLN
INTRAMUSCULAR | Status: AC
  Filled 2013-12-29: qty 1

## 2013-12-29 MED ORDER — POTASSIUM CHLORIDE IN NACL 40-0.9 MEQ/L-% IV SOLN
INTRAVENOUS | Status: DC
  Administered 2013-12-29: 175 mL/h via INTRAVENOUS
  Administered 2014-01-01 – 2014-01-02 (×3): 100 mL/h via INTRAVENOUS
  Filled 2013-12-29 (×24): qty 1000

## 2013-12-29 NOTE — ED Notes (Signed)
[facility] arrived to transport pt. Pt alert and oriented x4, VSS. No distress observed.

## 2013-12-29 NOTE — ED Notes (Signed)
Report given to [facility]. 

## 2013-12-29 NOTE — H&P (Signed)
[facility] History and Physical  Matthew [name] HWE:[id number] DOB: [birth year] DOA: 12/29/2013  Referring physician: ED MD Dr. Rogene [name] PCP: Matthew Herring., MD   Chief Complaint: Nausea and vomiting; generalized weakness.   HPI: Matthew [name] is a 38 y.o. male Nausea and vomiting; generalized weakness. 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 He presents to the emergency department with a 2 to three-day history of loose stools numbering approximately 3 daily, nausea and vomiting with 5-6 episodes of emesis daily and generalized weakness. He denies abdominal pain, pain with urination, fever, or chills. He did have black tarry stools twice, but no bright red blood per rectum. He denies coffee grounds emesis. He denies chest pain or shortness of breath. He has had shakiness as he has been trying to decrease his alcohol intake. Over the past several months, he has been drinking heavily because of recent stressors in his family. His wife has left him with their 3 children and his father recently died unexpectedly of a massive stroke. He admits to depression, but denies suicidal ideation. His primary care Matthew [name] had recently increased the dose of Prozac from 20 mg to 40 mg daily. He believes that the increase may have initiated the gastrointestinal symptoms. He was also treated with doxycycline approximately 3 weeks ago for treatment of [facility] spotted fever. He has been drinking 6-12 beers daily with shots of vodka or he drinks a pint of vodka or other hard liquor daily. He has been attempting to cut down but finds it very difficult to do so. He denies alcohol withdrawal seizures or previous DTs. He denies any history of diabetes mellitus.  In the ED, the patient was initially mildly tachycardic, but developed a heart rate in the 200-250 range. Per the  ED physician, it appeared that his rhythm may have been PSVT or some type of tachyarrhythmia. The initial EKG revealed questionable atrial fibrillation and other abnormalities with a heart rate of 228 beats per minute. He was given diltiazem IV and a total of 18 mg of IV adenosine. The followup EKG revealed sinus tachycardia with a heart rate of 123 beats per minute, prolonged QT interval, and nonspecific ST and T wave abnormalities. His chest x-ray reveals no acute cardiopulmonary disease. His lab data are significant for a serum sodium of 124, potassium of 2.5, glucose of 185, magnesium of 1.1, normal lipase, ALT of 188, AST is 175, point-of-care troponin I 0, and lactic acid 2.10. He is being admitted for further evaluation and management.    Review of Systems:  As above in history present illness. He also complains of some cramping in his legs. Otherwise review of systems is negative.  Past Medical History  Diagnosis Date  . Complete transposition of great vessels   . Hypertension   . Dyslipidemia   . Depression   . Alcohol abuse   . [facility] spotted fever 11/2013   Past Surgical History  Procedure Laterality Date  . Volar plate arthroplasty, right small finger proxima linterphalangeal joint    . Septostomy      rashkind  . Mustard procedure     Social History: He is married, but his wife recently left him. He has 3 children. He is employed at [employer]. He drinks 6-12 beers a day with or without a pint of vodka. He denies illicit drug use. He denies tobacco use.  Allergies  Allergen Reactions  . Aspirin     Family History  Problem Relation Age of Onset  . Rheumatic fever Father   . Hypertension Father      Prior to Admission medications   Medication Sig Start Date End Date Taking? Authorizing Matthew [name]  ALPRAZolam Matthew [name]) 0.5 MG tablet Take 0.5 mg by mouth 3 (three) times daily as needed for anxiety.    Yes Historical Matthew Salah, MD  busPIRone (BUSPAR) 15 MG tablet  Take 15 mg by mouth 2 (two) times daily.   Yes Historical Matthew Goldinger, MD  FLUoxetine (PROZAC) 20 MG capsule Take 20 mg by mouth daily.   Yes Historical Matthew Puertas, MD  loperamide (IMODIUM) 2 MG capsule Take 1 capsule (2 mg total) by mouth 4 (four) times daily as needed for diarrhea or loose stools. 11/23/13  Yes Matthew Hacker, MD  losartan (COZAAR) 50 MG tablet Take 50 mg by mouth daily.   Yes Historical Matthew Orlick, MD   Physical Exam: Filed Vitals:   12/29/13 2100  BP: 112/94  Pulse: 125  Temp:   Resp:     BP 112/94  Pulse 125  Temp(Src) 99 F (37.2 C) (Rectal)  Resp 24  Ht 6\' 3"  (1.905 m)  Wt 90.719 kg (200 lb)  BMI 25.00 kg/m2  SpO2 95%  General: Mildly tremulous, alert 38 year old Caucasian man laying in bed in no acute distress. Eyes: PERRL, normal lids, irises & conjunctiva ENT: Oropharynx with dry mucous membranes, grossly normal hearing. Neck: no LAD, masses or thyromegaly Cardiovascular: S1, S2, with tachycardia No LE edema. Telemetry: Sinus tachycardia  Respiratory: CTA bilaterally, no w/r/r. Normal respiratory effort. Abdomen: Positive bowel sounds, mildly obese soft, ntnd. No masses palpated. No appreciable hepatosplenomegaly. Skin: Well-healed sternotomy scar; no rash or induration seen on limited exam Musculoskeletal: grossly normal tone BUE/BLE Psychiatric: He is alert and oriented x3. He is mildly tremulous. He is tearful about his family situation. He denies suicidal ideation. His speech is clear. Neurologic: Cranial nerves II through XII are intact. Strength is 5 over 5 throughout. Sensation is grossly intact.           Labs on Admission:  Basic Metabolic Panel:  Recent Labs Lab 12/29/13 1839 12/29/13 1851  NA 127* 124*  K 3.0* 2.5*  CL 82* 87*  CO2 23  --   GLUCOSE 186* 185*  BUN 10 8  CREATININE 0.90 0.90  CALCIUM 9.1  --   MG 1.1*  --    Liver Function Tests:  Recent Labs Lab 12/29/13 1839  AST 175*  ALT 188*  ALKPHOS 70  BILITOT 1.4*    PROT 6.3  ALBUMIN 3.4*    Recent Labs Lab 12/29/13 2043  LIPASE 46   No results found for this basename: AMMONIA,  in the last 168 hours CBC:  Recent Labs Lab 12/29/13 1839 12/29/13 1851  WBC 12.0*  --   NEUTROABS 9.4*  --   HGB 17.4* 18.0*  HCT 47.1 53.0*  MCV 93.8  --   PLT 233  --    Cardiac Enzymes: No results found for this basename: CKTOTAL, CKMB, CKMBINDEX, TROPONINI,  in the last 168 hours  BNP (last 3 results) No results found for this basename: PROBNP,  in the last 8760 hours CBG: No results found for this basename: GLUCAP,  in the last 168 hours  Radiological Exams on Admission: Dg Chest Portable 1 View  12/29/2013   CLINICAL DATA:  Generalized weakness. Nausea, vomiting, diarrhea. History of transposition of the  great vessels.  EXAM: PORTABLE CHEST - 1 VIEW  COMPARISON:  10/01/2012.  FINDINGS: Cardiopericardial silhouette is within normal for size. Median sternotomy. 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 There is no airspace disease. No pleural effusion. Monitoring leads project over the chest.  IMPRESSION: No active cardiopulmonary disease.   Electronically Signed   By: Dereck Ligas M.D.   On: 12/29/2013 19:02    EKG: Independently reviewed. As above in history present illness.  Assessment/Plan Principal Problem:   Intractable nausea and vomiting Active Problems:   Tachyarrhythmia   Hypokalemia   Alcohol abuse   Hyponatremia   Hypomagnesemia   Alcohol withdrawal   Dehydration   Hyperglycemia   Complete transposition of great vessels   Depression   Melena   Alcoholic hepatitis   1. This is a 38 year old with a history of transposition of the great vessels, status post surgery as an infant who presents with intractable nausea and vomiting associated with heavy alcohol use. Family stressors has increased his alcohol use. 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 His liver transaminases are elevated, likely from alcoholic hepatitis. Surprisingly, he has no abdominal pain. His lipase is within normal limits. His gastrointestinal symptoms/presentation along with his alcohol abuse has led to dehydration (hyponatremia, polycythemia, and tachyarrhythmia). His electrolyte abnormalities are likely contributing to his tachyarrhythmia. His tachyarrhythmia is likely associated with dehydration and electrolyte abnormalities including hypokalemia, hypomagnesemia, and hyponatremia. He was given a total of 18 mg of adenosine and 10 mg of IV diltiazem. He was subtotally started on a diltiazem drip, but his most recent EKG reveals sinus tachycardia, and therefore, the diltiazem drip will be discontinued. I believe the patient would be better served and managed at [facility] with 24/7 cardiology coverage given his past medical history and impressive elevated heart rate in the ED. I have discussed the patient with cardiology fellow on call at [facility], Dr. Elias Else. He agrees with the management and is willing to assess the patient and provide further management recommendations when he arrives. The patient was also briefly discussed with Dr. Jana Hakim.     Plan: 1. The patient was given approximately 2 L of IV fluids in the emergency department. We'll continue vigorous IV fluids with 40 mEq of potassium chloride added. 2. He was given 2 runs of potassium chloride IV in the ED. 3. 2 g of magnesium sulfate has been ordered for treatment of hypomagnesemia. 4. Ativan alcohol withdrawal protocol with vitamin therapy has been ordered. 5. We'll start sliding scale NovoLog every 4 hours. We will keep him virtually n.p.o. and allow sips of clear liquids. 6. We'll start IV Protonix every 12 hours. We'll treat his nausea/vomiting symptomatically with as needed Zofran. 7. For further evaluation, we'll order an ultrasound of his abdomen to evaluate for  elevated LFTs, cardiac enzymes, 2-D echocardiogram, urine drug screen, and urinalysis. 8. We'll order a followup EKG tomorrow morning. 9. We'll ask the social worker to provide outpatient resources to help with the patient's sobriety.  Code Status: Full code Family Communication: Discussed with mother and uncle Disposition Plan: Transferred to [facility] step down unit and then discharge when clinically appropriate  Time spent: One hour and 30 minutes  [facility] Pager [phone]  **Disclaimer: This note may have been dictated with voice recognition software. Similar sounding words can inadvertently be transcribed and this note may contain transcription errors which may not have been corrected  upon publication of note.**

## 2013-12-29 NOTE — ED Provider Notes (Signed)
CSN: [id number]     Arrival date & time 12/29/13  1816 History   First MD Initiated Contact with Patient 12/29/13 1826     Chief Complaint  Patient presents with  . Weakness     (Consider location/radiation/quality/duration/timing/severity/associated sxs/prior Treatment) Patient is a 38 y.o. male presenting with weakness. The history is provided by the patient.  Weakness Pertinent negatives include no chest pain, no abdominal pain, no headaches and no shortness of breath.   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  Patient came in for generalized weakness and marked at nausea vomiting and diarrhea for 3 days. Patient's been drinking heavily of late has been trying to stop the alcohol feels it may be going through withdrawal. Patient on the way in noted that his heart rate was fast but no chest pain no gross shortness of breath. Patient states that his heart go fast in the past but was not under medical treatment at the time.  Patient arrived here with a significantly rapid heart rate around 238. Initial blood pressure was hypotensive however patient's skin color was very pink in color no cyanosis. Mentating fine. Capillary refill was one second. Leave that the blood pressure was a runny is due to the very rapid heart rate eventually repeated and had systolic blood pressure of about 100.  Past Medical History  Diagnosis Date  . Complete transposition of great vessels   . Hypertension   . Dyslipidemia    Past Surgical History  Procedure Laterality Date  . Volar plate arthroplasty, right small finger proxima linterphalangeal joint    . Septostomy      rashkind  . Mustard procedure     Family History  Problem Relation Age of Onset  . Rheumatic fever Father   . Hypertension Father    History  Substance Use Topics  . Smoking status: Never Smoker   . Smokeless tobacco: Not on file   . Alcohol Use: Yes     Comment: daily    Review of Systems  Constitutional: Positive for diaphoresis. Negative for fever.  HENT: Negative for congestion.   Eyes: Negative for visual disturbance.  Respiratory: Negative for chest tightness and shortness of breath.   Cardiovascular: Positive for palpitations. Negative for chest pain.  Gastrointestinal: Positive for nausea, vomiting and diarrhea. Negative for abdominal pain and blood in stool.  Genitourinary: Negative for hematuria.  Musculoskeletal: Negative for back pain.  Skin: Negative for rash.  Neurological: Positive for weakness. Negative for speech difficulty and headaches.  Hematological: Does not bruise/bleed easily.  Psychiatric/Behavioral: Negative for confusion.      Allergies  Aspirin  Home Medications   Prior to Admission medications   Medication Sig Start Date End Date Taking? Authorizing Provider  ALPRAZolam Duanne Moron) 0.5 MG tablet Take 0.5 mg by mouth 3 (three) times daily as needed for anxiety.     Historical Provider, MD  busPIRone (BUSPAR) 15 MG tablet Take 15 mg by mouth 2 (two) times daily.    Historical Provider, MD  doxycycline (VIBRA-TABS) 100 MG tablet Take 100 mg by mouth 2 (two) times daily. For 7 days. Started on 11/16/13    Historical Provider, MD  FLUoxetine (PROZAC) 20 MG capsule Take 20 mg by mouth daily.    Historical Provider, MD  loperamide (IMODIUM) 2 MG capsule Take 1 capsule (2 mg total) by mouth 4 (four) times daily as needed for diarrhea or loose stools. 11/23/13  Merryl Hacker, MD  losartan (COZAAR) 50 MG tablet TAKE ONE TABLET BY MOUTH ONCE DAILY    Fay Records, MD   BP 105/78  Pulse 126  Temp(Src) 97.3 F (36.3 C)  Resp 18  Ht 6\' 3"  (1.905 m)  Wt 200 lb (90.719 kg)  BMI 25.00 kg/m2  SpO2 93% Physical Exam  Nursing note and vitals reviewed. Constitutional: He is oriented to person, place, and time. He appears well-developed and well-nourished. He appears distressed.  HENT:   Head: Normocephalic and atraumatic.  Because membranes dry.  Eyes: Conjunctivae and EOM are normal. Pupils are equal, round, and reactive to light.  Neck: Normal range of motion.  Cardiovascular: Normal heart sounds.   No murmur heard. Very rapid heart rate irregular. Cap refill was 1 second not able to really palpate a pulse. The patient was not cyanotic. Good skin color pink in nature.  Pulmonary/Chest: Effort normal and breath sounds normal. No respiratory distress. He has no wheezes. He has no rales.  Abdominal: Soft. Bowel sounds are normal. There is no tenderness.  Musculoskeletal: He exhibits no tenderness.  Neurological: He is alert and oriented to person, place, and time. No cranial nerve deficit. He exhibits normal muscle tone. Coordination normal.  Skin: Skin is warm. No rash noted. No pallor.    ED Course  Procedures (including critical care time) Labs Review Labs Reviewed  CBC WITH DIFFERENTIAL - Abnormal; Notable for the following:    WBC 12.0 (*)    Hemoglobin 17.4 (*)    MCH 34.7 (*)    MCHC 36.9 (*)    Neutrophils Relative % 78 (*)    Neutro Abs 9.4 (*)    Lymphocytes Relative 10 (*)    Monocytes Absolute 1.5 (*)    All other components within normal limits  COMPREHENSIVE METABOLIC PANEL - Abnormal; Notable for the following:    Sodium 127 (*)    Potassium 3.0 (*)    Chloride 82 (*)    Glucose, Bld 186 (*)    Albumin 3.4 (*)    AST 175 (*)    ALT 188 (*)    Total Bilirubin 1.4 (*)    All other components within normal limits  I-STAT CHEM 8, ED - Abnormal; Notable for the following:    Sodium 124 (*)    Potassium 2.5 (*)    Chloride 87 (*)    Glucose, Bld 185 (*)    Calcium, Ion 1.07 (*)    Hemoglobin 18.0 (*)    HCT 53.0 (*)    All other components within normal limits  PROTIME-INR  MAGNESIUM  URINE RAPID DRUG SCREEN (HOSP PERFORMED)  URINALYSIS, ROUTINE W REFLEX MICROSCOPIC  I-STAT CG4 LACTIC ACID, ED  I-STAT TROPOININ, ED   Results for orders  placed during the hospital encounter of 12/29/13  CBC WITH DIFFERENTIAL      Result Value Ref Range   WBC 12.0 (*) 4.0 - 10.5 K/uL   RBC 5.02  4.22 - 5.81 MIL/uL   Hemoglobin 17.4 (*) 13.0 - 17.0 g/dL   HCT 47.1  39.0 - 52.0 %   MCV 93.8  78.0 - 100.0 fL   MCH 34.7 (*) 26.0 - 34.0 pg   MCHC 36.9 (*) 30.0 - 36.0 g/dL   RDW 13.6  11.5 - 15.5 %   Platelets 233  150 - 400 K/uL   Neutrophils Relative % 78 (*) 43 - 77 %   Neutro Abs 9.4 (*) 1.7 - 7.7 K/uL  Lymphocytes Relative 10 (*) 12 - 46 %   Lymphs Abs 1.1  0.7 - 4.0 K/uL   Monocytes Relative 12  3 - 12 %   Monocytes Absolute 1.5 (*) 0.1 - 1.0 K/uL   Eosinophils Relative 0  0 - 5 %   Eosinophils Absolute 0.1  0.0 - 0.7 K/uL   Basophils Relative 0  0 - 1 %   Basophils Absolute 0.0  0.0 - 0.1 K/uL  COMPREHENSIVE METABOLIC PANEL      Result Value Ref Range   Sodium 127 (*) 137 - 147 mEq/L   Potassium 3.0 (*) 3.7 - 5.3 mEq/L   Chloride 82 (*) 96 - 112 mEq/L   CO2 23  19 - 32 mEq/L   Glucose, Bld 186 (*) 70 - 99 mg/dL   BUN 10  6 - 23 mg/dL   Creatinine, Ser 0.90  0.50 - 1.35 mg/dL   Calcium 9.1  8.4 - 10.5 mg/dL   Total Protein 6.3  6.0 - 8.3 g/dL   Albumin 3.4 (*) 3.5 - 5.2 g/dL   AST 175 (*) 0 - 37 U/L   ALT 188 (*) 0 - 53 U/L   Alkaline Phosphatase 70  39 - 117 U/L   Total Bilirubin 1.4 (*) 0.3 - 1.2 mg/dL   GFR calc non Af Amer >90  >90 mL/min   GFR calc Af Amer >90  >90 mL/min  PROTIME-INR      Result Value Ref Range   Prothrombin Time 12.5  11.6 - 15.2 seconds   INR 0.95  0.00 - 1.49  I-STAT CG4 LACTIC ACID, ED      Result Value Ref Range   Lactic Acid, Venous 2.10  0.5 - 2.2 mmol/L  I-STAT CHEM 8, ED      Result Value Ref Range   Sodium 124 (*) 137 - 147 mEq/L   Potassium 2.5 (*) 3.7 - 5.3 mEq/L   Chloride 87 (*) 96 - 112 mEq/L   BUN 8  6 - 23 mg/dL   Creatinine, Ser 0.90  0.50 - 1.35 mg/dL   Glucose, Bld 185 (*) 70 - 99 mg/dL   Calcium, Ion 1.07 (*) 1.12 - 1.23 mmol/L   TCO2 20  0 - 100 mmol/L   Hemoglobin  18.0 (*) 13.0 - 17.0 g/dL   HCT 53.0 (*) 39.0 - 52.0 %   Comment NOTIFIED PHYSICIAN    I-STAT TROPOININ, ED      Result Value Ref Range   Troponin i, poc 0.00  0.00 - 0.08 ng/mL   Comment 3              Imaging Review Dg Chest Portable 1 View  12/29/2013   CLINICAL DATA:  Generalized weakness. Nausea, vomiting, diarrhea. History of transposition of the great vessels.  EXAM: PORTABLE CHEST - 1 VIEW  COMPARISON:  10/01/2012.  FINDINGS: Cardiopericardial silhouette is within normal for size. Median sternotomy. 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 There is no airspace disease. No pleural effusion. Monitoring leads project over the chest.  IMPRESSION: No active cardiopulmonary disease.   Electronically Signed   By: Dereck Ligas M.D.   On: 12/29/2013 19:02     EKG Interpretation   Date/Time:  Wednesday December 29 2013 18:59:56 EDT Ventricular Rate:  123 PR Interval:  58 QRS Duration: 109 QT Interval:  352 QTC Calculation: 503 R Axis:   -22 Text Interpretation:  Sinus tachycardia Borderline left axis deviation  Repol abnrm,  prob ischemia, anterolateral lds Minimal ST elevation,  lateral leads Prolonged QT interval Baseline wander in lead(s) V1 Improved  s/p Cardizem Confirmed by Malynda Smolinski  MD, Tiye Huwe [phone]) on 12/29/2013  7:42:19 PM       CRITICAL CARE Performed by: Fredia Sorrow Total critical care time: 60 Critical care time was exclusive of separately billable procedures and treating other patients. Critical care was necessary to treat or prevent imminent or life-threatening deterioration. Critical care was time spent personally by me on the following activities: development of treatment plan with patient and/or surrogate as well as nursing, discussions with consultants, evaluation of patient's response to treatment, examination of patient, obtaining history from patient or surrogate, ordering and performing treatments and  interventions, ordering and review of laboratory studies, ordering and review of radiographic studies, pulse oximetry and re-evaluation of patient's condition.   MDM   Final diagnoses:  Atrial fibrillation with rapid ventricular response  Hyponatremia  Hypokalemia  Dehydration  Alcohol withdrawal   Patient came in with very rapid tachycardia was narrow complex there was no widening. Heart rate was up to around 228. Patient always had good cap refill initial blood pressure was read as 72/30 but when we are able to get better reading we were getting systolics in the low 101B. Do not feel patient never was truly hypotensive. Patient came in for generalized weakness nausea vomiting and diarrhea for 3 days. Patient has been a heavy drinker of late and has been trying to stop and thought perhaps he was going through alcohol withdrawal. Patient had multiple episodes of vomiting and diarrhea no blood in them.  Due to the rapid heart rate it looked on EKG as if it was atrial fib with a rate was so fast it was really hard to tell did try adenosine 6 mg did not get any blocking affect. Repeated with 12 mg got blocking effect as the heart rate slowed down it was suggestive of atrial fib. However patient went back to the rapid heart rate quickly. Patient was then given diltiazem 10 mg initially then followed by another 10 mg. This brought his heart rate down into the 120s remain narrow complex. EKG was repeated still think that EKG is probably still atrial fibrillation. Patient blood pressure has been above the 100 with a few outliers that were not confirmed with repeat. Do not feel patient has been hypotensive. As stated patient also given Ativan. Patient started on diltiazem drip.   Patient's electrolytes with significant abnormalities marked hyponatremia marked height located anemia. Patient immediately started on 10 mEq of potassium IV piggyback x2. Cardiac monitoring continued. Chest x-ray was negative.  Lactic acid was slightly elevated but less than 4. Mild leukocytosis hemoglobin and hematocrit Markley concentrated consistent with significant dehydration. Patient given 2 L of normal saline.  Overall patient much more stable than when he first arrived. Leave this is a combination of rapid atrial fibrillation dehydration hypokalemia and alcohol withdrawal. Patient is mentating fine not having any delusions not having any tactile hallucinations or any kind of hallucinations.  Discussed with the hospitalist team here or they were thinking that maybe due to severity need to be transferred to [facility] they will see the patient make determination whether he is admitted here or transferred there.    Fredia Sorrow, MD 12/29/13 2106

## 2013-12-29 NOTE — ED Notes (Signed)
Generalized weakness with n/v/d x 3 days.

## 2013-12-29 NOTE — ED Notes (Signed)
Zakowsky at bedside  1832- 180-200 bpm Adenosine 6mg  IV 1833- 219 bpm 137/83 1835- Adenosine 12 mg IV 1837- 204 bmp 137/83 Cardizem 10mg  IV 1840- 122bpm 137/83

## 2013-12-30 ENCOUNTER — Inpatient Hospital Stay (HOSPITAL_COMMUNITY): Payer: BC Managed Care – PPO

## 2013-12-30 DIAGNOSIS — F329 Major depressive disorder, single episode, unspecified: Secondary | ICD-10-CM

## 2013-12-30 DIAGNOSIS — I10 Essential (primary) hypertension: Secondary | ICD-10-CM

## 2013-12-30 DIAGNOSIS — F10939 Alcohol use, unspecified with withdrawal, unspecified: Secondary | ICD-10-CM

## 2013-12-30 DIAGNOSIS — E871 Hypo-osmolality and hyponatremia: Secondary | ICD-10-CM

## 2013-12-30 DIAGNOSIS — R000 Tachycardia, unspecified: Secondary | ICD-10-CM

## 2013-12-30 DIAGNOSIS — R7402 Elevation of levels of lactic acid dehydrogenase (LDH): Secondary | ICD-10-CM

## 2013-12-30 DIAGNOSIS — E876 Hypokalemia: Secondary | ICD-10-CM

## 2013-12-30 DIAGNOSIS — E872 Acidosis, unspecified: Secondary | ICD-10-CM

## 2013-12-30 DIAGNOSIS — F1010 Alcohol abuse, uncomplicated: Secondary | ICD-10-CM

## 2013-12-30 DIAGNOSIS — I471 Supraventricular tachycardia: Secondary | ICD-10-CM | POA: Diagnosis present

## 2013-12-30 DIAGNOSIS — R7401 Elevation of levels of liver transaminase levels: Secondary | ICD-10-CM

## 2013-12-30 DIAGNOSIS — I4892 Unspecified atrial flutter: Secondary | ICD-10-CM

## 2013-12-30 DIAGNOSIS — Q203 Discordant ventriculoarterial connection: Secondary | ICD-10-CM

## 2013-12-30 DIAGNOSIS — E43 Unspecified severe protein-calorie malnutrition: Secondary | ICD-10-CM | POA: Diagnosis present

## 2013-12-30 DIAGNOSIS — F10239 Alcohol dependence with withdrawal, unspecified: Secondary | ICD-10-CM

## 2013-12-30 DIAGNOSIS — F3289 Other specified depressive episodes: Secondary | ICD-10-CM

## 2013-12-30 DIAGNOSIS — I4891 Unspecified atrial fibrillation: Principal | ICD-10-CM

## 2013-12-30 DIAGNOSIS — R740 Nonspecific elevation of levels of transaminase and lactic acid dehydrogenase [LDH]: Secondary | ICD-10-CM

## 2013-12-30 LAB — COMPREHENSIVE METABOLIC PANEL
ALT: 144 U/L — ABNORMAL HIGH (ref 0–53)
AST: 124 U/L — ABNORMAL HIGH (ref 0–37)
Albumin: 3 g/dL — ABNORMAL LOW (ref 3.5–5.2)
Alkaline Phosphatase: 58 U/L (ref 39–117)
BUN: 7 mg/dL (ref 6–23)
CO2: 23 mEq/L (ref 19–32)
Calcium: 8.7 mg/dL (ref 8.4–10.5)
Chloride: 94 mEq/L — ABNORMAL LOW (ref 96–112)
Creatinine, Ser: 0.71 mg/dL (ref 0.50–1.35)
GFR calc Af Amer: >90 mL/min (ref >90)
GFR calc non Af Amer: >90 mL/min (ref >90)
Glucose, Bld: 91 mg/dL (ref 70–99)
Potassium: 4.7 mEq/L (ref 3.7–5.3)
Sodium: 130 mEq/L — ABNORMAL LOW (ref 137–147)
Total Bilirubin: 0.9 mg/dL (ref 0.3–1.2)
Total Protein: 5.5 g/dL — ABNORMAL LOW (ref 6.0–8.3)

## 2013-12-30 LAB — GLUCOSE, CAPILLARY
Glucose-Capillary: 107 mg/dL — ABNORMAL HIGH (ref 70–99)
Glucose-Capillary: 124 mg/dL — ABNORMAL HIGH (ref 70–99)
Glucose-Capillary: 166 mg/dL — ABNORMAL HIGH (ref 70–99)
Glucose-Capillary: 89 mg/dL (ref 70–99)
Glucose-Capillary: 91 mg/dL (ref 70–99)
Glucose-Capillary: 94 mg/dL (ref 70–99)
Glucose-Capillary: 95 mg/dL (ref 70–99)

## 2013-12-30 LAB — MAGNESIUM: Magnesium: 1.6 mg/dL (ref 1.5–2.5)

## 2013-12-30 LAB — CBC
HCT: 40.3 % (ref 39.0–52.0)
Hemoglobin: 14.5 g/dL (ref 13.0–17.0)
MCH: 34.4 pg — ABNORMAL HIGH (ref 26.0–34.0)
MCHC: 36 g/dL (ref 30.0–36.0)
MCV: 95.7 fL (ref 78.0–100.0)
Platelets: 166 10*3/uL (ref 150–400)
RBC: 4.21 MIL/uL — ABNORMAL LOW (ref 4.22–5.81)
RDW: 13.4 % (ref 11.5–15.5)
WBC: 7.8 10*3/uL (ref 4.0–10.5)

## 2013-12-30 LAB — MRSA PCR SCREENING: MRSA by PCR: POSITIVE — AB

## 2013-12-30 LAB — HEMOGLOBIN A1C
Hgb A1c MFr Bld: 5.5 % (ref <5.7)
Mean Plasma Glucose: 111 mg/dL (ref <117)

## 2013-12-30 LAB — TSH: TSH: 0.858 u[IU]/mL (ref 0.350–4.500)

## 2013-12-30 LAB — HEPARIN LEVEL (UNFRACTIONATED): Heparin Unfractionated: 0.7 IU/mL (ref 0.30–0.70)

## 2013-12-30 LAB — TROPONIN I: Troponin I: <0.3 ng/mL (ref <0.30)

## 2013-12-30 MED ORDER — DILTIAZEM HCL 100 MG IV SOLR
5.0000 mg/h | Freq: Once | INTRAVENOUS | Status: AC
  Administered 2013-12-30: 10 mg/h via INTRAVENOUS
  Filled 2013-12-30: qty 100

## 2013-12-30 MED ORDER — WARFARIN SODIUM 7.5 MG PO TABS
7.5000 mg | ORAL_TABLET | Freq: Once | ORAL | Status: AC
  Administered 2013-12-31: 7.5 mg via ORAL
  Filled 2013-12-30: qty 1

## 2013-12-30 MED ORDER — WARFARIN - PHARMACIST DOSING INPATIENT: Freq: Every day | Status: DC

## 2013-12-30 MED ORDER — HEPARIN (PORCINE) IN NACL 100-0.45 UNIT/ML-% IJ SOLN
1250.0000 [IU]/h | INTRAMUSCULAR | Status: DC
  Administered 2013-12-30 – 2013-12-31 (×2): 1600 [IU]/h via INTRAVENOUS
  Administered 2014-01-01: 1250 [IU]/h via INTRAVENOUS
  Filled 2013-12-30 (×7): qty 250

## 2013-12-30 MED ORDER — HEPARIN BOLUS VIA INFUSION
4000.0000 [IU] | Freq: Once | INTRAVENOUS | Status: AC
  Administered 2013-12-30: 4000 [IU] via INTRAVENOUS
  Filled 2013-12-30: qty 4000

## 2013-12-30 MED ORDER — DILTIAZEM HCL 100 MG IV SOLR
5.0000 mg/h | INTRAVENOUS | Status: DC
  Administered 2013-12-30 – 2013-12-31 (×2): 10 mg/h via INTRAVENOUS
  Administered 2013-12-31: 11 mg/h via INTRAVENOUS
  Administered 2014-01-01: 10 mg/h via INTRAVENOUS
  Filled 2013-12-30: qty 100

## 2013-12-30 MED ORDER — HEPARIN (PORCINE) IN NACL 100-0.45 UNIT/ML-% IJ SOLN
INTRAMUSCULAR | Status: AC
  Administered 2013-12-30: 1600 [IU]/h via INTRAVENOUS
  Filled 2013-12-30: qty 250

## 2013-12-30 MED ORDER — AMIODARONE HCL IN DEXTROSE 360-4.14 MG/200ML-% IV SOLN
INTRAVENOUS | Status: AC
  Filled 2013-12-30: qty 200

## 2013-12-30 MED ORDER — PNEUMOCOCCAL VAC POLYVALENT 25 MCG/0.5ML IJ INJ
0.5000 mL | INJECTION | INTRAMUSCULAR | Status: AC
  Administered 2013-12-31: 0.5 mL via INTRAMUSCULAR
  Filled 2013-12-30: qty 0.5

## 2013-12-30 NOTE — Progress Notes (Signed)
Utilization review completed. Alexismarie Flaim, RN, BSN. 

## 2013-12-30 NOTE — Progress Notes (Signed)
Subjective:  No CP/SOB  Objective:  Temp:  [97.3 F (36.3 C)-99 F (37.2 C)] 97.8 F (36.6 C) (06/18 0412) Pulse Rate:  [118-241] 120 (06/18 0700) Resp:  [16-27] 16 (06/18 0700) BP: (72-120)/(30-94) 94/56 mmHg (06/18 0700) SpO2:  [93 %-100 %] 93 % (06/18 0700) Weight:  [177 lb 14.6 oz (80.7 kg)-200 lb (90.719 kg)] 177 lb 14.6 oz (80.7 kg) (06/18 0008) Weight change:   Intake/Output from previous day: 06/17 0701 - 06/18 0700 In: 1397.8 [I.V.:1397.8] Out: 600 [Urine:600]  Intake/Output from this shift:    Physical Exam: General appearance: alert and no distress Neck: no adenopathy, no carotid bruit, no JVD, supple, symmetrical, trachea midline and thyroid not enlarged, symmetric, no tenderness/mass/nodules Lungs: clear to auscultation bilaterally Heart: irregularly irregular rhythm and tachy Extremities: extremities normal, atraumatic, no cyanosis or edema  Lab Results: Results for orders placed during the hospital encounter of 12/29/13 (from the past 48 hour(s))  CBC WITH DIFFERENTIAL     Status: Abnormal   Collection Time    12/29/13  6:39 PM      Result Value Ref Range   WBC 12.0 (*) 4.0 - 10.5 K/uL   RBC 5.02  4.22 - 5.81 MIL/uL   Hemoglobin 17.4 (*) 13.0 - 17.0 g/dL   HCT 47.1  39.0 - 52.0 %   MCV 93.8  78.0 - 100.0 fL   MCH 34.7 (*) 26.0 - 34.0 pg   MCHC 36.9 (*) 30.0 - 36.0 g/dL   RDW 13.6  11.5 - 15.5 %   Platelets 233  150 - 400 K/uL   Neutrophils Relative % 78 (*) 43 - 77 %   Neutro Abs 9.4 (*) 1.7 - 7.7 K/uL   Lymphocytes Relative 10 (*) 12 - 46 %   Lymphs Abs 1.1  0.7 - 4.0 K/uL   Monocytes Relative 12  3 - 12 %   Monocytes Absolute 1.5 (*) 0.1 - 1.0 K/uL   Eosinophils Relative 0  0 - 5 %   Eosinophils Absolute 0.1  0.0 - 0.7 K/uL   Basophils Relative 0  0 - 1 %   Basophils Absolute 0.0  0.0 - 0.1 K/uL  COMPREHENSIVE METABOLIC PANEL     Status: Abnormal   Collection Time    12/29/13  6:39 PM      Result Value Ref Range   Sodium 127 (*) 137  - 147 mEq/L   Potassium 3.0 (*) 3.7 - 5.3 mEq/L   Chloride 82 (*) 96 - 112 mEq/L   CO2 23  19 - 32 mEq/L   Glucose, Bld 186 (*) 70 - 99 mg/dL   BUN 10  6 - 23 mg/dL   Creatinine, Ser 0.90  0.50 - 1.35 mg/dL   Calcium 9.1  8.4 - 10.5 mg/dL   Total Protein 6.3  6.0 - 8.3 g/dL   Albumin 3.4 (*) 3.5 - 5.2 g/dL   AST 175 (*) 0 - 37 U/L   ALT 188 (*) 0 - 53 U/L   Alkaline Phosphatase 70  39 - 117 U/L   Total Bilirubin 1.4 (*) 0.3 - 1.2 mg/dL   GFR calc non Af Amer >90  >90 mL/min   GFR calc Af Amer >90  >90 mL/min   Comment: (NOTE)     The eGFR has been calculated using the CKD EPI equation.     This calculation has not been validated in all clinical situations.     eGFR's persistently <90 mL/min signify possible  Chronic Kidney     Disease.  PROTIME-INR     Status: None   Collection Time    12/29/13  6:39 PM      Result Value Ref Range   Prothrombin Time 12.5  11.6 - 15.2 seconds   INR 0.95  0.00 - 1.49  MAGNESIUM     Status: Abnormal   Collection Time    12/29/13  6:39 PM      Result Value Ref Range   Magnesium 1.1 (*) 1.5 - 2.5 mg/dL  I-STAT TROPOININ, ED     Status: None   Collection Time    12/29/13  6:47 PM      Result Value Ref Range   Troponin i, poc 0.00  0.00 - 0.08 ng/mL   Comment 3            Comment: Due to the release kinetics of cTnI,     a negative result within the first hours     of the onset of symptoms does not rule out     myocardial infarction with certainty.     If myocardial infarction is still suspected,     repeat the test at appropriate intervals.  I-STAT CHEM 8, ED     Status: Abnormal   Collection Time    12/29/13  6:51 PM      Result Value Ref Range   Sodium 124 (*) 137 - 147 mEq/L   Potassium 2.5 (*) 3.7 - 5.3 mEq/L   Chloride 87 (*) 96 - 112 mEq/L   BUN 8  6 - 23 mg/dL   Creatinine, Ser 0.90  0.50 - 1.35 mg/dL   Glucose, Bld 185 (*) 70 - 99 mg/dL   Calcium, Ion 1.07 (*) 1.12 - 1.23 mmol/L   TCO2 20  0 - 100 mmol/L   Hemoglobin 18.0 (*)  13.0 - 17.0 g/dL   HCT 53.0 (*) 39.0 - 52.0 %   Comment NOTIFIED PHYSICIAN    I-STAT CG4 LACTIC ACID, ED     Status: None   Collection Time    12/29/13  6:57 PM      Result Value Ref Range   Lactic Acid, Venous 2.10  0.5 - 2.2 mmol/L  LIPASE, BLOOD     Status: None   Collection Time    12/29/13  8:43 PM      Result Value Ref Range   Lipase 46  11 - 59 U/L  URINE RAPID DRUG SCREEN (HOSP PERFORMED)     Status: Abnormal   Collection Time    12/29/13  8:55 PM      Result Value Ref Range   Opiates NONE DETECTED  NONE DETECTED   Cocaine NONE DETECTED  NONE DETECTED   Benzodiazepines POSITIVE (*) NONE DETECTED   Amphetamines NONE DETECTED  NONE DETECTED   Tetrahydrocannabinol NONE DETECTED  NONE DETECTED   Barbiturates NONE DETECTED  NONE DETECTED   Comment:            DRUG SCREEN FOR MEDICAL PURPOSES     ONLY.  IF CONFIRMATION IS NEEDED     FOR ANY PURPOSE, NOTIFY LAB     WITHIN 5 DAYS.                LOWEST DETECTABLE LIMITS     FOR URINE DRUG SCREEN     Drug Class       Cutoff (ng/mL)     Amphetamine      1000     Barbiturate  200     Benzodiazepine   163     Tricyclics       846     Opiates          300     Cocaine          300     THC              50  URINALYSIS, ROUTINE W REFLEX MICROSCOPIC     Status: Abnormal   Collection Time    12/29/13  8:55 PM      Result Value Ref Range   Color, Urine AMBER (*) YELLOW   Comment: BIOCHEMICALS MAY BE AFFECTED BY COLOR   APPearance CLEAR  CLEAR   Specific Gravity, Urine 1.015  1.005 - 1.030   pH 7.0  5.0 - 8.0   Glucose, UA NEGATIVE  NEGATIVE mg/dL   Hgb urine dipstick TRACE (*) NEGATIVE   Bilirubin Urine MODERATE (*) NEGATIVE   Ketones, ur 15 (*) NEGATIVE mg/dL   Protein, ur 100 (*) NEGATIVE mg/dL   Urobilinogen, UA 1.0  0.0 - 1.0 mg/dL   Nitrite NEGATIVE  NEGATIVE   Leukocytes, UA NEGATIVE  NEGATIVE  URINE MICROSCOPIC-ADD ON     Status: Abnormal   Collection Time    12/29/13  8:55 PM      Result Value Ref Range    Squamous Epithelial / LPF FEW (*) RARE   WBC, UA 0-2  <3 WBC/hpf   RBC / HPF 0-2  <3 RBC/hpf   Bacteria, UA FEW (*) RARE   Casts GRANULAR CAST (*) NEGATIVE  I-STAT CG4 LACTIC ACID, ED     Status: Abnormal   Collection Time    12/29/13  8:56 PM      Result Value Ref Range   Lactic Acid, Venous 3.30 (*) 0.5 - 2.2 mmol/L  BLOOD GAS, ARTERIAL     Status: Abnormal   Collection Time    12/29/13  9:00 PM      Result Value Ref Range   O2 Content 3.0     Delivery systems NASAL CANNULA     pH, Arterial 7.420  7.350 - 7.450   pCO2 arterial 39.4  35.0 - 45.0 mmHg   pO2, Arterial 101.0 (*) 80.0 - 100.0 mmHg   Bicarbonate 25.0 (*) 20.0 - 24.0 mEq/L   TCO2 21.2  0 - 100 mmol/L   Acid-Base Excess 1.0  0.0 - 2.0 mmol/L   O2 Saturation 97.5     Patient temperature 37.0     Collection site RIGHT RADIAL     Drawn by [id number]     Sample type ARTERIAL     Allens test (pass/fail) PASS  PASS  TROPONIN I     Status: None   Collection Time    12/29/13  9:22 PM      Result Value Ref Range   Troponin I <0.30  <0.30 ng/mL   Comment:            Due to the release kinetics of cTnI,     a negative result within the first hours     of the onset of symptoms does not rule out     myocardial infarction with certainty.     If myocardial infarction is still suspected,     repeat the test at appropriate intervals.  GLUCOSE, CAPILLARY     Status: Abnormal   Collection Time    12/30/13 12:06 AM      Result Value  Ref Range   Glucose-Capillary 124 (*) 70 - 99 mg/dL   Comment 1 Documented in Chart     Comment 2 Notify RN    MRSA PCR SCREENING     Status: Abnormal   Collection Time    12/30/13 12:44 AM      Result Value Ref Range   MRSA by PCR POSITIVE (*) NEGATIVE   Comment:            The GeneXpert MRSA Assay (FDA     approved for NASAL specimens     only), is one component of a     comprehensive MRSA colonization     surveillance program. It is not     intended to diagnose MRSA     infection nor to  guide or     monitor treatment for     MRSA infections.  GLUCOSE, CAPILLARY     Status: None   Collection Time    12/30/13  4:10 AM      Result Value Ref Range   Glucose-Capillary 95  70 - 99 mg/dL   Comment 1 Notify RN     Comment 2 Documented in Chart    GLUCOSE, CAPILLARY     Status: None   Collection Time    12/30/13  8:30 AM      Result Value Ref Range   Glucose-Capillary 89  70 - 99 mg/dL    Imaging: Imaging results have been reviewed  Assessment/Plan:   1. Active Problems: 2.   Complete transposition of great vessels 3.   Nausea vomiting and diarrhea 4.   Atrial flutter 5.   Hypokalemia 6.   Hypomagnesemia 7.   Alcohol abuse 8.   Alcohol withdrawal 9.   Dehydration with hyponatremia 10.   Depression 11.   Hyperglycemia 12.   Melena 13.   Alcoholic hepatitis 14.   Time Spent Directly with Patient:  30 minutes  Length of Stay:  LOS: 1 day   Pt seen last night for evaluation of tachycardia. H/O transposition of GV corrected as a child (Mustard). Pt of Dr. Harrington Challenger'. He has a H/O excessive ETOH abuse (elevated LFTs and electrolyte abn, decreased Mg and K).  On IV dilt with HR 120s and SBP approx 100. Will get 2D. Replete Mg/K, titrate dilt. No Amio secondary to liver abn. Start IV hep. Not a candidate for oral anticoag given ETOH. Once electrolyte abn corrected can possibly do TEE DCCV.Will get EP to see.   Lorretta Harp 12/30/2013, 10:59 AM

## 2013-12-30 NOTE — Progress Notes (Signed)
ANTICOAGULATION CONSULT NOTE - Initial Consult  Pharmacy Consult for heparin Indication: atrial fibrillation  Allergies  Allergen Reactions  . Aspirin     Patient Measurements: Height: 6\' 3"  (190.5 cm) Weight: 177 lb 14.6 oz (80.7 kg) IBW/kg (Calculated) : 84.5 Heparin Dosing Weight: 107 kg  Vital Signs: Temp: 98.5 F (36.9 C) (06/18 1146) Temp src: Oral (06/18 1146) BP: 107/71 mmHg (06/18 1146) Pulse Rate: 126 (06/18 1146)  Labs:  Recent Labs  12/29/13 1839 12/29/13 1851 12/29/13 2122 12/30/13 1043  HGB 17.4* 18.0*  --  14.5  HCT 47.1 53.0*  --  40.3  PLT 233  --   --  166  LABPROT 12.5  --   --   --   INR 0.95  --   --   --   CREATININE 0.90 0.90  --  0.71  TROPONINI  --   --  <0.30 <0.30    Estimated Creatinine Clearance: 147.1 ml/min (by C-G formula based on Cr of 0.71).   Medical History: Past Medical History  Diagnosis Date  . Complete transposition of great vessels   . Hypertension   . Dyslipidemia   . Depression   . Alcohol abuse   . [place] spotted fever 11/2013    Medications:  Prescriptions prior to admission  Medication Sig Dispense Refill  . ALPRAZolam (XANAX) 0.5 MG tablet Take 0.5 mg by mouth 3 (three) times daily as needed for anxiety.       . busPIRone (BUSPAR) 15 MG tablet Take 15 mg by mouth 2 (two) times daily.      Marland Kitchen FLUoxetine (PROZAC) 20 MG capsule Take 20 mg by mouth daily.      Marland Kitchen loperamide (IMODIUM) 2 MG capsule Take 1 capsule (2 mg total) by mouth 4 (four) times daily as needed for diarrhea or loose stools.  12 capsule  0  . losartan (COZAAR) 50 MG tablet Take 50 mg by mouth daily.       Scheduled:  . amiodarone      . busPIRone  15 mg Oral BID  . folic acid  1 mg Oral Daily  . insulin aspart  0-9 Units Subcutaneous 6 times per day  . LORazepam  0-4 mg Intravenous Q6H   Followed by  . [START ON 12/31/2013] LORazepam  0-4 mg Intravenous Q12H  . multivitamin with minerals  1 tablet Oral Daily  . pantoprazole  (PROTONIX) IV  40 mg Intravenous Q12H  . [START ON 12/31/2013] pneumococcal 23 valent vaccine  0.5 mL Intramuscular Tomorrow-1000  . thiamine  100 mg Oral Daily   Or  . thiamine  100 mg Intravenous Daily   Infusions:  . 0.9 % NaCl with KCl 40 mEq / L 175 mL/hr (12/29/13 2157)  . diltiazem (CARDIZEM) infusion 10 mg/hr (12/30/13 0900)  . heparin 1,600 Units/hr (12/30/13 1111)    Assessment: 38 yo who was admitted for afib and other issues. Pt to be started on IV heparin today for anticoagulation. He is not a candidate for oral anticoag due to ETOH.    Goal of Therapy:  Heparin level 0.3-0.7 units/ml Monitor platelets by anticoagulation protocol: Yes   Plan:   Heparin bolus 4000 units x1 Heparin drip at 1600 units/hr F/u with 6hr heparin level Daily HL and CBC

## 2013-12-30 NOTE — Progress Notes (Signed)
Matthew [name] - Stepdown / ICU Progress Note  Matthew [name] ERD:[id number] DOB: 02-13-1976 DOA: 12/29/2013 PCP: Glo Herring., MD  Time spent : 45 minutes  Brief narrative: 38 y.o. male who presented with nausea and vomiting; generalized weakness.  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 He presented with a 2 to three-day history of loose stools numbering approximately 3 daily, nausea and vomiting with 5-6 episodes of emesis daily and generalized weakness. He denied abdominal pain, pain with urination, fever, or chills. He did have black tarry stools twice, but no bright red blood per rectum. He denied coffee grounds emesis. He denied chest pain or shortness of breath. He has had shakiness as he has been trying to decrease his alcohol intake. Over the past several months, he has been drinking heavily because of recent stressors in his family. His wife has left him with their 3 children and his father recently died unexpectedly of a massive stroke. He admittted to depression, but denied suicidal ideation. His primary care provider had recently increased the dose of Prozac from 20 mg to 40 mg daily. He believes that the increase may have initiated the gastrointestinal symptoms. He had recently been treated with doxycycline approximately 3 weeks ago for treatment of [facility] spotted fever. He has been drinking 6-12 beers daily with shots of vodka or he drinks a pint of vodka or other hard liquor daily. He has been attempting to cut down but finds it very difficult to do so. He denied alcohol withdrawal seizures or previous DTs. He denies any history of diabetes mellitus.   In the ED, the patient was initially mildly tachycardic, but developed a heart rate in the 200-250 range. Per the ED physician, it appeared that his rhythm may have been PSVT. The initial EKG revealed questionable  atrial fibrillation and other abnormalities with a heart rate of 228 beats per minute. He was given diltiazem IV and a total of 18 mg of IV adenosine. The followup EKG revealed sinus tachycardia with a heart rate of 123 beats per minute, prolonged QT interval, and nonspecific ST and T wave abnormalities. His chest x-ray reveals no acute cardiopulmonary disease. His lab data are significant for a serum sodium of 124, potassium of 2.5, glucose of 185, magnesium of [name].[name], normal lipase, ALT of 188, AST is 175, point-of-care troponin I 0, and lactic acid 2.10. He is being admitted for further evaluation and management.  HPI/Subjective: Very sleepy but encouraged that was able to sleep some overnight-no CP or SOB and no additional diarrhea in several hours   Assessment/Plan: Active Problems:   Complete transposition of great vessels -Cards consulted given h/o acute atrial arrhythmia    Nausea vomiting and diarrhea/Melena -Appears quiescent at this time -Continue supportive care -Melena possibly from recurrent nausea and vomiting although given alcohol history could be related to varices so follow closely -Given recent treatment with doxycycline and diarrhea as a precaution we'll check C. difficile PCR    Atrial flutter -EKG today reveals atrial flutter with 2:[name] conduction pattern -Cardiology has evaluated and recommended keeping n.p.o. in the event needs to undergo TEE/cardioversion -Continue Cardizem -Due to persistent tachycardia with rates in the 120s with soft BP somewhat limited HU:DJSH control agents -Also has alcoholism and transaminitis so amiodarone probably not an appropriate choice at this time -Continue IV heparin -Could have been precipitated by profound dehydration -Chronic alcoholism also contributing  factor and apparently patient's father had history of atrial fib  -Echo pending -Cardiology asking EP to evaluate -On prophylactic oxygen given persistent tachycardia noting chest  x-ray at presentation without active cardiopulmonary disease    Dehydration with hyponatremia/Metabolic acidosis -Exact etiology unclear: Could have started as a viral etiology, could be related to possible C. difficile or could be related to alcohol withdrawal -Continue aggressive IV fluid hydration -Lactic acid has increased since admission and likely related to recent tachycardia and inadequate perfusion related to same -Follow    Elevated transaminase level -Likely from ongoing alcohol use but recent tachycardia and dehydration could explain his weight -Abdominal ultrasound pending -Follow labs      Hypokalemia/Hypomagnesemia/ Hyperglycemia -Follow labs and replete as indicated    Alcohol abuse/Alcohol withdrawal -Continue CIWA -Once medically stable we'll ask psych social worker to evaluate the patient and provide outpatient program -Patient apparently established with primary care physician who is managing medication for depression    HYPERLIPIDEMIA-MIXED    HYPERTENSION, BENIGN -Cozaar on hold 2/2 relative hypotension    Depression -Prozac recently adjusted but patient believes contributed to GI symptoms so when resume do so at previous dosage of 20 mg -BuSpar has been continued as well as an    DVT prophylaxis: IV heparin Code Status: Full Family Communication: Patient's mother at bedside Disposition Plan/Expected LOS: Step down   Consultants: Cardiology EP  Procedures: 2-D echocardiogram pending  Antibiotics: None  Objective: Blood pressure 94/56, pulse 120, temperature 97.8 F (36.6 C), temperature source Oral, resp. rate 16, height 6\' 3"  ([name].905 m), weight 177 lb 14.6 oz (80.7 kg), SpO2 93.00%.  Intake/Output Summary (Last 24 hours) at 12/30/13 1102 Last data filed at 12/30/13 0538  Gross per 24 hour  Intake 1397.83 ml  Output    600 ml  Net 797.83 ml     Exam: General: No acute respiratory distress-lethargic but awakens easily Lungs: Clear to  auscultation bilaterally without wheezes or crackles, 2 L Cardiovascular: Irregular rate and rhythm consistent with atrial flutter; otherwise without murmur gallop or rub normal S1 and S2, no peripheral edema or JVD Abdomen: Nontender, nondistended, soft, bowel sounds positive, no rebound, no ascites, no appreciable mass Musculoskeletal: No significant cyanosis, clubbing of bilateral lower extremities    Scheduled Meds:  Scheduled Meds: . amiodarone      . busPIRone  15 mg Oral BID  . folic acid  [name] mg Oral Daily  . heparin      . heparin  4,000 Units Intravenous Once  . insulin aspart  0-9 Units Subcutaneous 6 times per day  . LORazepam  0-4 mg Intravenous Q6H   Followed by  . [START ON 12/31/2013] LORazepam  0-4 mg Intravenous Q12H  . multivitamin with minerals  [name] tablet Oral Daily  . pantoprazole (PROTONIX) IV  40 mg Intravenous Q12H  . thiamine  100 mg Oral Daily   Or  . thiamine  100 mg Intravenous Daily   Continuous Infusions: . 0.9 % NaCl with KCl 40 mEq / L 175 mL/hr (12/29/13 2157)  . diltiazem (CARDIZEM) infusion    . heparin      Data Reviewed: Basic Metabolic Panel:  Recent Labs Lab 12/29/13 1839 12/29/13 1851  NA 127* 124*  K 3.0* 2.5*  CL 82* 87*  CO2 23  --   GLUCOSE 186* 185*  BUN 10 8  CREATININE 0.90 0.90  CALCIUM 9.[name]  --   MG [name].[name]*  --    Liver Function Tests:  Recent  Labs Lab 12/29/13 1839  AST 175*  ALT 188*  ALKPHOS 70  BILITOT [name].4*  PROT 6.3  ALBUMIN 3.4*    Recent Labs Lab 12/29/13 2043  LIPASE 46   No results found for this basename: AMMONIA,  in the last 168 hours CBC:  Recent Labs Lab 12/29/13 1839 12/29/13 1851  WBC 12.0*  --   NEUTROABS 9.4*  --   HGB 17.4* 18.0*  HCT 47.[name] 53.0*  MCV 93.8  --   PLT 233  --    Cardiac Enzymes:  Recent Labs Lab 12/29/13 2122  TROPONINI <0.30   BNP (last 3 results) No results found for this basename: PROBNP,  in the last 8760 hours CBG:  Recent Labs Lab 12/30/13 0006  12/30/13 0410 12/30/13 0830  GLUCAP 124* 95 89    Recent Results (from the past 240 hour(s))  MRSA PCR SCREENING     Status: Abnormal   Collection Time    12/30/13 12:44 AM      Result Value Ref Range Status   MRSA by PCR POSITIVE (*) NEGATIVE Final   Comment:            The GeneXpert MRSA Assay (FDA     approved for NASAL specimens     only), is one component of a     comprehensive MRSA colonization     surveillance program. It is not     intended to diagnose MRSA     infection nor to guide or     monitor treatment for     MRSA infections.     Studies:  Recent x-ray studies have been reviewed in detail by the Attending Physician       Erin Hearing, ANP [facility] Office  [phone] Pager [phone]   **If unable to reach the above provider after paging please contact the [facility] @ [phone]  On-Call/Text Page:      [email or web address]      password [email or web address]  If 7PM-7AM, please contact night-coverage [email or web address] Password [email or web address] 12/30/2013, 11:02 AM   LOS: [name] day   Examining patient and discussed plan with ANP Ebony Hail agree with a/P. Discuss plan with patient and answered all questions Patient with multiple complex medical problems spent greater than 35 minutes in direct patient care

## 2013-12-30 NOTE — Plan of Care (Signed)
Problem: Alteration in mood & ability to function due to Goal: LTG-Pt is able to verbalize triggers for his/her abuse (Patient is able to verbalize triggers for his/her abuse and strategies to maintain sobriety) Outcome: Progressing Pt verbalized several losses that he has had over the past 2 years.

## 2013-12-30 NOTE — Consult Note (Signed)
CARDIOLOGY CONSULT NOTE  Patient ID: Matthew [name], MRN: [id number], DOB/AGE: November 01, 1975 38 y.o. Admit date: 12/29/2013 Date of Consult: 12/30/2013  Primary Physician: Glo Herring., MD Primary Cardiologist: Dr. Dorris Carnes  Chief Complaint: weakness Reason for Consultation: tachycardia  HPI: 38 y.o. 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 Reports feeling weak in the legs. Has had poor intake. Upon admission, found to be tachycardic in the 200+. Rather unaware of his fast heart rate. No syncope. Awake and mentating per notes, was given adenosine and per report, appeared consistent with atrial fibrillation. Given diltizem 10 x 2 and heart rate improved to 120s. Started on gtt. Given 2 L IV NS.  Multiple derangements on CMP including hyponatremia, elevated LFTs. Troponin normal.  Admits to excessive drinking 1 pint/day for last several months.  Initial EKG shows narrow complex, fast at 220 rhythm. Follow up EKG, p wave with altered morphology, RVH with strain, chronic ST changes.   Transferred to [facility] due to complex cardiac past.  On telemetry currently, rate of 123, rather flat consistent.  Last echo 04/12/2011: Study Conclusions  - Left ventricle: LV is small. LV systolic function is normal. - Right ventricle: RV is dilated. There is flattening of the interventricular septum. RV systolic function is moderately depressed. Impressions:  - s/p Mustard operation. Compared to images from echo in 2010, there is no significant change.   Past Medical History  Diagnosis Date  . Complete transposition of great vessels   . Hypertension   . Dyslipidemia   . Depression   . Alcohol abuse   . [facility] spotted fever 11/2013      Surgical History:  Past Surgical History  Procedure Laterality Date  . Volar  plate arthroplasty, right small finger proxima linterphalangeal joint    . Septostomy      rashkind  . Mustard procedure       Home Meds: Prior to Admission medications   Medication Sig Start Date End Date Taking? Authorizing Provider  ALPRAZolam Duanne Moron) 0.5 MG tablet Take 0.5 mg by mouth 3 (three) times daily as needed for anxiety.    Yes Historical Provider, MD  busPIRone (BUSPAR) 15 MG tablet Take 15 mg by mouth 2 (two) times daily.   Yes Historical Provider, MD  FLUoxetine (PROZAC) 20 MG capsule Take 20 mg by mouth daily.   Yes Historical Provider, MD  loperamide (IMODIUM) 2 MG capsule Take 1 capsule (2 mg total) by mouth 4 (four) times daily as needed for diarrhea or loose stools. 11/23/13  Yes Merryl Hacker, MD  losartan (COZAAR) 50 MG tablet Take 50 mg by mouth daily.   Yes Historical Provider, MD    Inpatient Medications:  . busPIRone  15 mg Oral BID  . folic acid  1 mg Oral Daily  . heparin  5,000 Units Subcutaneous 3 times per day  . insulin aspart  0-9 Units Subcutaneous 6 times per day  . LORazepam  0-4 mg Intravenous Q6H   Followed by  . [START ON 12/31/2013] LORazepam  0-4 mg Intravenous Q12H  . multivitamin with minerals  1 tablet Oral Daily  . pantoprazole (PROTONIX) IV  40 mg Intravenous Q12H  . thiamine  100 mg Oral Daily   Or  . thiamine  100 mg Intravenous Daily   . 0.9 % NaCl with KCl 40  mEq / L 175 mL/hr (12/29/13 2157)    Allergies:  Allergies  Allergen Reactions  . Aspirin     History   Social History  . Marital Status: Married    Spouse Name: N/A    Number of Children: N/A  . Years of Education: N/A   Occupational History  . Not on file.   Social History Main Topics  . Smoking status: Never Smoker   . Smokeless tobacco: Not on file  . Alcohol Use: Yes     Comment: daily  . Drug Use: No  . Sexual Activity: Not on file   Other Topics Concern  . Not on file   Social History Narrative  . No narrative on file     Family History   Problem Relation Age of Onset  . Rheumatic fever Father   . Hypertension Father      Review of Systems: General: negative for chills, fever, night sweats   Cardiovascular:see HPI Dermatological: negative for rash Respiratory: negative for cough or wheezing Urologic: negative for hematuria Abdominal: see HPI Neurologic: negative for visual changes, syncope, or dizziness All other systems reviewed and are otherwise negative except as noted above.  Labs:  Recent Labs  12/29/13 2122  TROPONINI <0.30   Lab Results  Component Value Date   WBC 12.0* 12/29/2013   HGB 18.0* 12/29/2013   HCT 53.0* 12/29/2013   MCV 93.8 12/29/2013   PLT 233 12/29/2013    Recent Labs Lab 12/29/13 1839 12/29/13 1851  NA 127* 124*  K 3.0* 2.5*  CL 82* 87*  CO2 23  --   BUN 10 8  CREATININE 0.90 0.90  CALCIUM 9.1  --   PROT 6.3  --   BILITOT 1.4*  --   ALKPHOS 70  --   ALT 188*  --   AST 175*  --   GLUCOSE 186* 185*   Lab Results  Component Value Date   CHOL 226* 08/10/2012   HDL 47.10 08/10/2012   LDLCALC 130* 04/17/2011   TRIG 85.0 08/10/2012   No results found for this basename: DDIMER    Radiology/Studies:  Dg Chest Portable 1 View  12/29/2013   CLINICAL DATA:  Generalized weakness. Nausea, vomiting, diarrhea. History of transposition of the great vessels.  EXAM: PORTABLE CHEST - 1 VIEW  COMPARISON:  10/01/2012.  FINDINGS: Cardiopericardial silhouette is within normal for size. Median sternotomy. 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 There is no airspace disease. No pleural effusion. Monitoring leads project over the chest.  IMPRESSION: No active cardiopulmonary disease.   Electronically Signed   By: Dereck Ligas M.D.   On: 12/29/2013 19:02     Physical Exam: Blood pressure 99/60, pulse 125, temperature 98.2 F (36.8 C), temperature source Oral, resp. rate 22, height 6\' 3"  (1.905 m), weight 80.7 kg (177 lb 14.6 oz), SpO2  95.00%. General: Well developed, well nourished, in no acute distress. Head: Normocephalic, atraumatic, sclera non-icteric, no xanthomas, nares are without discharge.  Neck: Supple. Negative for carotid bruits. JVD not elevated. Lungs: Clear bilaterally to auscultation without wheezes, rales, or rhonchi. Breathing is unlabored. Heart: tachy but regular. No murmurs, rubs, or gallops appreciated. Abdomen: Soft, non-tender, non-distended with normoactive bowel sounds. No hepatomegaly. No rebound/guarding. No obvious abdominal masses. Msk:  Strength and tone appear normal for age. Extremities: No clubbing or cyanosis. No edema.  Distal pedal pulses are 2+ and equal bilaterally. Neuro: Alert and oriented X 3. Moves all extremities spontaneously.  Psych:  Responds to questions appropriately with a normal affect.   Assessment and Plan:  1. Atypical atrial flutter with RVR 2. Transposition s/p Mustard as child 3. ETOH abuse 4. Hypertension  38 y.o. 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  At risk of atrial arrhythmias due to history of congenital heart dz and corrective surgery. Further at risk due to ETOH abuse, a well known precipitant. Likely difficult to control rates better than 120s as it is flutter. Continue dilt gtt for now. Will likely need to get EP involved for consideration of ablation though in the setting of congenital heart disease, this is will need to be discussed.  Needs echo to re-evaluate congenital heart disease.  Discussed the risk of ETOH and atrial arrhythmias with the patient. Abstinence recommended.   Unexplained weight loss of 40 lbs over the last several months. Unclear etiology.  Summary: Echo ordered Keep NPO in  case of possible procedure (TEE and DCCV) Decrease to maintenance fluids Replete K to > 4, Mg > 2  Thank you for this interesting consult. We will continue to follow. Please call with questions. Signed, WHITLOCK, Alfonse C. MD 12/30/2013, 1:10 AM

## 2013-12-30 NOTE — Progress Notes (Signed)
  Echocardiogram 2D Echocardiogram has been performed.  [name], Matthew FRANCES 12/30/2013, 5:13 PM

## 2013-12-30 NOTE — Consult Note (Addendum)
ELECTROPHYSIOLOGY CONSULT NOTE   Patient ID: PHOENYX PAULSEN MRN: [id number], DOB/AGE: 36-Dec-1977   Admit date: 12/29/2013 Date of Consult: 12/30/2013  Primary Physician: Redmond School, MD Primary Cardiologist: Harrington Challenger, MD Reason for Consultation: Tachycardia  History of Present Illness Matthew [name] is a 38 y.o. 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 He has been drinking alcohol heavily, 1-2 pints per day. He has not been eating well. He states he really has not felt well for 3 months - had Strep throat in April then tick bite in May and hasn't felt well since. On presentation to [facility], he was tachycardic at 220 bpm. He reports palpitations. No dizziness or syncope. ECG shows a regular, narrow complex tachycardia. Magnesium 1.1. Potassium 3.0. He was given adenosine and per report, telemetry appeared consistent with underlying atrial fibrillation. He was started on diltiazem infusion and transported to [facility]. Today, he remains tachycardic with rate in 120s. His BP is soft so up-titration of rate controlling medications has been limited. ECG shows atypical atrial flutter at 123 bpm. EP has been asked to see for recommendations.  Past Medical History Past Medical History  Diagnosis Date  . Complete transposition of great vessels   . Hypertension   . Dyslipidemia   . Depression   . Alcohol abuse   . [facility] spotted fever 11/2013    Past Surgical History Past Surgical History  Procedure Laterality Date  . Volar plate arthroplasty, right small finger proxima linterphalangeal joint    . Septostomy      rashkind  . Mustard procedure      Allergies/Intolerances Allergies  Allergen Reactions  . Aspirin    Inpatient Medications . amiodarone      . busPIRone  15 mg Oral BID  . folic acid  1 mg Oral  Daily  . insulin aspart  0-9 Units Subcutaneous 6 times per day  . LORazepam  0-4 mg Intravenous Q6H   Followed by  . [START ON 12/31/2013] LORazepam  0-4 mg Intravenous Q12H  . multivitamin with minerals  1 tablet Oral Daily  . pantoprazole (PROTONIX) IV  40 mg Intravenous Q12H  . [START ON 12/31/2013] pneumococcal 23 valent vaccine  0.5 mL Intramuscular Tomorrow-1000  . thiamine  100 mg Oral Daily   Or  . thiamine  100 mg Intravenous Daily   . 0.9 % NaCl with KCl 40 mEq / L 175 mL/hr (12/29/13 2157)  . diltiazem (CARDIZEM) infusion 10 mg/hr (12/30/13 0900)  . heparin 1,600 Units/hr (12/30/13 1111)    Family History Family History  Problem Relation Age of Onset  . Rheumatic fever Father   . Hypertension Father      Social History History   Social History  . Marital Status: Married    Spouse Name: N/A    Number of Children: N/A  . Years of Education: N/A   Occupational History  . Not on file.   Social History Main Topics  . Smoking status: Never Smoker   . Smokeless tobacco: Not on file  . Alcohol Use: Yes     Comment: daily  . Drug Use: No  . Sexual Activity: Not on file   Other Topics Concern  . Not on file   Social History Narrative  . No narrative on file     Review of Systems  General: No chills, fever, night sweats or weight changes  Cardiovascular:  No chest pain, dyspnea on exertion, edema, orthopnea, palpitations, paroxysmal nocturnal dyspnea Dermatological: No rash, lesions or masses Respiratory: No cough, dyspnea Urologic: No hematuria, dysuria Abdominal: No bright red blood per rectum, melena, or hematemesis Neurologic: No visual changes, changes in mental status All other systems reviewed and are otherwise negative except as noted above.  Physical Exam Vitals: Blood pressure 107/71, pulse 126, temperature 98.5 F (36.9 C), temperature source Oral, resp. rate 24, height 6\' 3"  (1.905 m), weight 177 lb 14.6 oz (80.7 kg), SpO2 92.00%.  General:  Well developed 38 y.o. male in no acute distress. HEENT: Normocephalic, atraumatic. EOMs intact. Sclera nonicteric. Oropharynx clear.  Neck: Supple. No JVD. Lungs: Respirations regular and unlabored, CTA bilaterally. No wheezes, rales or rhonchi. Heart: Tachycardic, regular. S1, S2 present. No murmurs, rub, S3 or S4. Abdomen: Soft, non-tender, non-distended. BS present x 4 quadrants.  Extremities: No clubbing, cyanosis or edema. PT/Radials 2+ and equal bilaterally. Psych: Normal affect. Neuro: Alert and oriented X 3. Moves all extremities spontaneously. Musculoskeletal: No kyphosis. Skin: Intact. Warm and dry. No rashes or petechiae in exposed areas.   Labs  Recent Labs  12/29/13 2122 12/30/13 1043  TROPONINI <0.30 <0.30   Lab Results  Component Value Date   WBC 7.8 12/30/2013   HGB 14.5 12/30/2013   HCT 40.3 12/30/2013   MCV 95.7 12/30/2013   PLT 166 12/30/2013    Recent Labs Lab 12/30/13 1043  NA 130*  K 4.7  CL 94*  CO2 23  BUN 7  CREATININE 0.71  CALCIUM 8.7  PROT 5.5*  BILITOT 0.9  ALKPHOS 58  ALT 144*  AST 124*  GLUCOSE 91  Magnesium 1.6  Lab Results  Component Value Date   CHOL 226* 08/10/2012   HDL 47.10 08/10/2012   LDLCALC 130* 04/17/2011   TRIG 85.0 08/10/2012    Recent Labs  12/29/13 1839  INR 0.95    Radiology/Studies Dg Chest Portable 1 View  12/29/2013   CLINICAL DATA:  Generalized weakness. Nausea, vomiting, diarrhea. History of transposition of the great vessels.  EXAM: PORTABLE CHEST - 1 VIEW  COMPARISON:  10/01/2012.  FINDINGS: Cardiopericardial silhouette is within normal for size. Median sternotomy. 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 There is no airspace disease. No pleural effusion. Monitoring leads project over the chest.  IMPRESSION: No active cardiopulmonary disease.   Electronically Signed   By: Dereck Ligas M.D.   On: 12/29/2013 19:02    Echocardiogram  Echo this admission is  pending  Most recent echo September 2012  Study Conclusions - Left ventricle: LV is small. LV systolic function is normal. - Right ventricle: RV is dilated. There is flattening of the interventricular septum. RV systolic function is moderately depressed. Impression: - s/p Mustard operation. Compared to images from echo in 2010, there is no significant change.   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

## 2013-12-30 NOTE — Progress Notes (Signed)
INITIAL NUTRITION ASSESSMENT  DOCUMENTATION CODES Per approved criteria  -Severe  malnutrition in the context of social or environmental circumstances   Pt meets criteria for severe MALNUTRITION in the context of social/environmental circumstances as evidenced by 23.7% weight loss x 5 months and energy intake </= 50% of estimated needs for >/= 1 month.   INTERVENTION:  Diet advancement per MD  RD to continue to follow   NUTRITION DIAGNOSIS: Inadequate oral intake related to poor appetite as evidenced by poor PO intake x 6 months.   Goal: Pt to meet >/=90% of estimated nutrition needs   Monitor:  Diet advancement, weight trend, labs   Reason for Assessment: Positive Malnutrition Screening Tool Score   38 y.o. 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 He presents to the emergency department with a 2 to three-day history of loose stools numbering approximately 3 daily, nausea and vomiting with 5-6 episodes of emesis daily and generalized weakness  Over the past several months, he has been drinking heavily because of recent stressors in his family. His wife has left him with their 3 children and his father recently died unexpectedly of a massive stroke. He admits to depression, but denies suicidal ideation. He has been drinking 6-12 beers daily with shots of vodka or he drinks a pint of vodka or other hard liquor daily.   Pt reports poor appetite due to stress from personal situation at home. Pt states that he has not been eating like he used to. He states that when he was married he would come home to a home cooked meal each night and now he doesn't have that. Pt's dietary recall included a general diet with 3 meals a day. Pt mostly eats out.   Pt states that he weighed 240 lbs last  November and has since lost 40 lbs. Per chart records, pt has a weight loss of 23.7% x 5 months which is severe for time frame.   Nutrition focused physical exam performed. No muscle or subcutaneous fat depletion noticed.   Discussed the use of oral nutrition supplements and eating colder bland foods when pt has a poor appetite. Pt is interested in receiving chocolate Ensure when his diet advances.   Height: Ht Readings from Last 1 Encounters:  12/30/13 6\' 3"  (1.905 m)    Weight: Wt Readings from Last 1 Encounters:  12/30/13 177 lb 14.6 oz (80.7 kg)    Ideal Body Weight: 89.09 kg   % Ideal Body Weight: 90%  Wt Readings from Last 10 Encounters:  12/30/13 177 lb 14.6 oz (80.7 kg)  11/23/13 208 lb (94.348 kg)  08/10/12 232 lb (105.235 kg)  04/12/11 214 lb (97.07 kg)  04/26/10 219 lb (99.338 kg)  04/21/09 219 lb (99.338 kg)  01/26/09 213 lb (96.616 kg)    Usual Body Weight: 240 lbs   % Usual Body Weight: 74%   BMI:  Body mass index is 22.24 kg/(m^2)., Normal   Estimated Nutritional Needs: Kcal: 2000 - 2200  Protein: 80 - 95 grams  Fluid: >/= 2 L/day   Skin: intact   Diet Order:   NPO  EDUCATION NEEDS: -No education needs identified at this time   Intake/Output Summary (Last 24 hours) at 12/30/13 1349 Last data filed at 12/30/13 0538  Gross per 24 hour  Intake 1397.83 ml  Output  600 ml  Net 797.83 ml    Last BM: PTA    Labs:   Recent Labs Lab 12/29/13 1839 12/29/13 1851 12/30/13 1043  NA 127* 124* 130*  K 3.0* 2.5* 4.7  CL 82* 87* 94*  CO2 23  --  23  BUN 10 8 7   CREATININE 0.90 0.90 0.71  CALCIUM 9.1  --  8.7  MG 1.1*  --  1.6  GLUCOSE 186* 185* 91    CBG (last 3)   Recent Labs  12/30/13 0006 12/30/13 0410 12/30/13 0830  GLUCAP 124* 95 89    Scheduled Meds: . amiodarone      . busPIRone  15 mg Oral BID  . folic acid  1 mg Oral Daily  . insulin aspart  0-9 Units Subcutaneous 6 times per day  . LORazepam  0-4 mg Intravenous Q6H    Followed by  . [START ON 12/31/2013] LORazepam  0-4 mg Intravenous Q12H  . multivitamin with minerals  1 tablet Oral Daily  . pantoprazole (PROTONIX) IV  40 mg Intravenous Q12H  . [START ON 12/31/2013] pneumococcal 23 valent vaccine  0.5 mL Intramuscular Tomorrow-1000  . thiamine  100 mg Oral Daily   Or  . thiamine  100 mg Intravenous Daily    Continuous Infusions: . 0.9 % NaCl with KCl 40 mEq / L 175 mL/hr (12/29/13 2157)  . diltiazem (CARDIZEM) infusion 10 mg/hr (12/30/13 0900)  . heparin 1,600 Units/hr (12/30/13 1111)    Past Medical History  Diagnosis Date  . Complete transposition of great vessels   . Hypertension   . Dyslipidemia   . Depression   . Alcohol abuse   . [facility] spotted fever 11/2013    Past Surgical History  Procedure Laterality Date  . Volar plate arthroplasty, right small finger proxima linterphalangeal joint    . Septostomy      rashkind  . Mustard procedure      Carrolyn Leigh, BS Dietetic Intern Pager: [phone]  I agree with the above information and made appropriate revisions. Inda Coke MS, RD, LDN Inpatient Registered Dietitian Pager: [phone] After-hours pager: [phone]

## 2013-12-30 NOTE — Progress Notes (Addendum)
Clinical Social Work Department BRIEF PSYCHOSOCIAL ASSESSMENT 12/30/2013  Patient:  Matthew [name], Matthew [name]     Account Number:  [id number]     Admit date:  12/29/2013  Clinical Social Worker:  Freeman Caldron  Date/Time:  12/30/2013 03:02 PM  Referred by:  Physician  Date Referred:  12/30/2013 Referred for  Substance Abuse   Other Referral:   Interview type:  Patient Other interview type:    PSYCHOSOCIAL DATA Living Status:  FAMILY Admitted from facility:   Level of care:   Primary support name:  Matthew [name]( [phone]) Primary support relationship to patient:  SPOUSE Degree of support available:   Fair--pt states his father died a few months ago and pt is going through a divorce. Pt has support from friends and extended family.    CURRENT CONCERNS Current Concerns  Substance Abuse   Other Concerns:    SOCIAL WORK ASSESSMENT / PLAN CSW explained referral to pt, and engaged pt in conversation about his alcohol abuse. Pt states he drinks every other day, and the amount of alochol he drinks varies. Pt states he typically drinks 1 pint of liquor when he drinks heavily. Pt states his children and health are primary motivating factors to stop drinking. CSW and pt talked extensively about the effects of heavy drinking on pt's health. Pt states he is aware of some community resources, including an AA meeting held at his church, which he plans to start attending when he is discharged. CSW provided resource list of inpatient and outpatient programs to pt.   Assessment/plan status:  No Further Intervention Required Other assessment/ plan:   Information/referral to community resources:   Alcohol abuse resource list provided to pt    PATIENT'S/FAMILY'S RESPONSE TO PLAN OF CARE: Good--pt engaged in open and honest discussion about his drinking, and thanked CSW for resources. Utilized solution-focused and substance-abuse counseling techniques during session with pt. CSW signing  off.       Ky Barban, MSW, [facility] Clinical Social Worker [phone]

## 2013-12-31 DIAGNOSIS — E860 Dehydration: Secondary | ICD-10-CM

## 2013-12-31 DIAGNOSIS — I2789 Other specified pulmonary heart diseases: Secondary | ICD-10-CM

## 2013-12-31 DIAGNOSIS — E43 Unspecified severe protein-calorie malnutrition: Secondary | ICD-10-CM

## 2013-12-31 DIAGNOSIS — I272 Pulmonary hypertension, unspecified: Secondary | ICD-10-CM | POA: Diagnosis present

## 2013-12-31 DIAGNOSIS — R1115 Cyclical vomiting syndrome unrelated to migraine: Secondary | ICD-10-CM

## 2013-12-31 LAB — COMPREHENSIVE METABOLIC PANEL
ALT: 137 U/L — ABNORMAL HIGH (ref 0–53)
AST: 119 U/L — ABNORMAL HIGH (ref 0–37)
Albumin: 3.1 g/dL — ABNORMAL LOW (ref 3.5–5.2)
Alkaline Phosphatase: 63 U/L (ref 39–117)
BUN: 6 mg/dL (ref 6–23)
CO2: 26 mEq/L (ref 19–32)
Calcium: 8.7 mg/dL (ref 8.4–10.5)
Chloride: 93 mEq/L — ABNORMAL LOW (ref 96–112)
Creatinine, Ser: 0.71 mg/dL (ref 0.50–1.35)
GFR calc Af Amer: >90 mL/min (ref >90)
GFR calc non Af Amer: >90 mL/min (ref >90)
Glucose, Bld: 99 mg/dL (ref 70–99)
Potassium: 3.8 mEq/L (ref 3.7–5.3)
Sodium: 132 mEq/L — ABNORMAL LOW (ref 137–147)
Total Bilirubin: 0.6 mg/dL (ref 0.3–1.2)
Total Protein: 5.5 g/dL — ABNORMAL LOW (ref 6.0–8.3)

## 2013-12-31 LAB — HEPARIN LEVEL (UNFRACTIONATED)
Heparin Unfractionated: 0.91 IU/mL — ABNORMAL HIGH (ref 0.30–0.70)
Heparin Unfractionated: 0.72 IU/mL — ABNORMAL HIGH (ref 0.30–0.70)

## 2013-12-31 LAB — PROTIME-INR
INR: 1.07 (ref 0.00–1.49)
Prothrombin Time: 13.7 seconds (ref 11.6–15.2)

## 2013-12-31 LAB — LACTIC ACID, PLASMA: Lactic Acid, Venous: 0.7 mmol/L (ref 0.5–2.2)

## 2013-12-31 LAB — CBC
HCT: 38.6 % — ABNORMAL LOW (ref 39.0–52.0)
Hemoglobin: 13.7 g/dL (ref 13.0–17.0)
MCH: 34.3 pg — ABNORMAL HIGH (ref 26.0–34.0)
MCHC: 35.5 g/dL (ref 30.0–36.0)
MCV: 96.7 fL (ref 78.0–100.0)
Platelets: 170 10*3/uL (ref 150–400)
RBC: 3.99 MIL/uL — ABNORMAL LOW (ref 4.22–5.81)
RDW: 13.3 % (ref 11.5–15.5)
WBC: 6.5 10*3/uL (ref 4.0–10.5)

## 2013-12-31 LAB — GLUCOSE, CAPILLARY: Glucose-Capillary: 99 mg/dL (ref 70–99)

## 2013-12-31 MED ORDER — MUPIROCIN 2 % EX OINT
1.0000 "application " | TOPICAL_OINTMENT | Freq: Two times a day (BID) | CUTANEOUS | Status: AC
  Administered 2013-12-31 – 2014-01-04 (×10): 1 via NASAL
  Filled 2013-12-31 (×2): qty 22

## 2013-12-31 MED ORDER — PANTOPRAZOLE SODIUM 40 MG PO TBEC
40.0000 mg | DELAYED_RELEASE_TABLET | Freq: Two times a day (BID) | ORAL | Status: DC
  Administered 2013-12-31 – 2014-01-05 (×11): 40 mg via ORAL
  Filled 2013-12-31 (×11): qty 1

## 2013-12-31 MED ORDER — OXYCODONE HCL 5 MG PO TABS
5.0000 mg | ORAL_TABLET | ORAL | Status: DC | PRN
  Administered 2013-12-31: 5 mg via ORAL
  Filled 2013-12-31 (×3): qty 1

## 2013-12-31 MED ORDER — CHLORHEXIDINE GLUCONATE CLOTH 2 % EX PADS
6.0000 | MEDICATED_PAD | Freq: Every day | CUTANEOUS | Status: AC
  Administered 2013-12-31 – 2014-01-03 (×4): 6 via TOPICAL

## 2013-12-31 MED ORDER — MORPHINE SULFATE 2 MG/ML IJ SOLN: 1.0000 mg | INTRAMUSCULAR | Status: DC | PRN

## 2013-12-31 NOTE — Progress Notes (Addendum)
[place] for Heparin / Coumadin Indication: atrial fibrillation  Allergies  Allergen Reactions  . Aspirin    Assessment: 38 yo male who continues on heparin and Coumadin for Afib Heparin level = 0.72 No bleeding noted   Goal of Therapy:  Heparin level 0.3-0.7 units/ml Monitor platelets by anticoagulation protocol: Yes INR = 2 to 3   Plan:  Decrease heparin to 1350 units / hr Coumadin 7.5 mg po x 1 today Daily labs  Resume Prozac?      Labs:  Recent Labs  12/29/13 1839 12/29/13 1851 12/29/13 2122 12/30/13 1043 12/30/13 2215 12/31/13 0444 12/31/13 0500 12/31/13 1336  HGB 17.4* 18.0*  --  14.5  --  13.7  --   --   HCT 47.1 53.0*  --  40.3  --  38.6*  --   --   PLT 233  --   --  166  --  170  --   --   LABPROT 12.5  --   --   --   --   --  13.7  --   INR 0.95  --   --   --   --   --  1.07  --   HEPARINUNFRC  --   --   --   --  0.70  --  0.91* 0.72*  CREATININE 0.90 0.90  --  0.71  --  0.71  --   --   TROPONINI  --   --  <0.30 <0.30  --   --   --   --     Estimated Creatinine Clearance: 154 ml/min (by C-G formula based on Cr of 0.71).    Thank you. Anette Guarneri, PharmD [phone]

## 2013-12-31 NOTE — Progress Notes (Signed)
Matthew [name]  Matthew [name] TDD:[id number] DOB: 22-Jan-1976 DOA: 12/29/2013 PCP: Glo Herring., MD  Time spent : 45 minutes  Brief narrative: 38 y.o. male who presented with nausea and vomiting; generalized weakness.  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 He presented with a 2 to three-day history of loose stools numbering approximately 3 daily, nausea and vomiting with 5-6 episodes of emesis daily and generalized weakness. He denied abdominal pain, pain with urination, fever, or chills. He did have black tarry stools twice, but no bright red blood per rectum. He denied coffee grounds emesis. He denied chest pain or shortness of breath. He has had shakiness as he has been trying to decrease his alcohol intake. Over the past several months, he has been drinking heavily because of recent stressors in his family. His wife has left him with their 3 children and his father recently died unexpectedly of a massive stroke. He admittted to depression, but denied suicidal ideation. His primary care provider had recently increased the dose of Prozac from 20 mg to 40 mg daily. He believes that the increase may have initiated the gastrointestinal symptoms. He had recently been treated with doxycycline approximately 3 weeks ago for treatment of [facility] spotted fever. He has been drinking 6-12 beers daily with shots of vodka or he drinks a pint of vodka or other hard liquor daily. He has been attempting to cut down but finds it very difficult to do so. He denied alcohol withdrawal seizures or previous DTs. He denies any history of diabetes mellitus.   In the ED, the patient was initially mildly tachycardic, but developed a heart rate in the 200-250 range. Per the ED physician, it appeared that his rhythm may have been PSVT. The initial EKG revealed questionable  atrial fibrillation and other abnormalities with a heart rate of 228 beats per minute. He was given diltiazem IV and a total of 18 mg of IV adenosine. The followup EKG revealed sinus tachycardia with a heart rate of 123 beats per minute, prolonged QT interval, and nonspecific ST and T wave abnormalities. His chest x-ray reveals no acute cardiopulmonary disease. His lab data are significant for a serum sodium of 124, potassium of 2.5, glucose of 185, magnesium of 1.1, normal lipase, ALT of 188, AST is 175, point-of-care troponin I 0, and lactic acid 2.10. He is being admitted for further evaluation and management.  HPI/Subjective: Sleeping- had recently received IV MSO4.  Assessment/Plan: Active Problems:   Complete transposition of great vessels -Cards consulted given h/o acute atrial arrhythmia    Nausea vomiting and diarrhea/Melena -Appears quiescent at this time -Continue supportive care -Melena possibly from recurrent nausea and vomiting although given alcohol history could be related to varices so follow closely -Given recent treatment with doxycycline and diarrhea as a precaution will check C. difficile PCR    Atrial flutter -EKG 6/18 revealed atrial flutter with 2:1 conduction pattern -Continue Cardizem IV -Continue IV heparin and Coumadin -If remains in AF with RVR will likely undergo DCCV Monday-EP following -Chronic alcoholism also contributing factor  -6/18 echocardiogram; chronic systolic CHF, pulmonary hypertension see results below  Chronic systolic CHF -Per cardiology  Pulmonary hypertension  -Per cardiology    Dehydration with hyponatremia/Metabolic acidosis -Exact etiology unclear: Could have started as a viral etiology, could be related to possible C. difficile or could be related to alcohol  withdrawal -Continue IV fluid hydration-has just now reached a cumulative positive balance of 89 cc -Lactic acid had increased on 6/18 after admission and was likely related to  recent tachycardia and inadequate perfusion related to same- today is down to 0.7    Elevated transaminase level -Likely from ongoing alcohol use but recent tachycardia and dehydration could explain his weight -Abdominal ultrasound severe FLD but no cirrhosis -Follow labs      Hypokalemia/Hypomagnesemia/ Hyperglycemia -Follow labs and replete as indicated    Alcohol abuse/Alcohol withdrawal -Continue CIWA -Once medically stable we'll ask psych social worker to evaluate the patient and provide outpatient program -Patient apparently established with primary care physician who is managing medication for depression    HYPERLIPIDEMIA-MIXED    HYPERTENSION, BENIGN -Cozaar on hold 2/2 relative hypotension-BP remains soft    Depression -Prozac recently adjusted but patient believes contributed to GI symptoms so when resume do so at previous dosage of 20 mg -BuSpar 15mg  BID    DVT prophylaxis: IV heparin Code Status: Full Family Communication: No family at bedside Disposition Plan/Expected LOS: Step down   Consultants: Cardiology EP  Procedures: 6/18 echocardiogram  - Ventricular septum: The contour showed diastolic flattening and systolic flattening. - Pulmonary arteries: PA peak pressure: 111 mm Hg (S). - Patient with D transposition of the great vessels s/p Mustard procedure (atrial switch). There is evidence of a baffle in left atrium.  -Moderate RAE. Moderate RVE (systemic ventricle) with severely reduced function;  -moderate regurgitation throught the systemic AV valve (TR);  -Morphologic LV shows septal hypokinesis with mildly reduced function (EF 45-50);  Compared to 04/12/11, systemic ventricular function is worse.      Antibiotics: None  Objective: Blood pressure 103/69, pulse 99, temperature 98.5 F (36.9 C), temperature source Oral, resp. rate 19, height 6\' 3"  (1.905 m), weight 92.9 kg (204 lb 12.9 oz), SpO2 91.00%.  Intake/Output Summary (Last 24 hours) at  12/31/13 1847 Last data filed at 12/31/13 1800  Gross per 24 hour  Intake 4300.23 ml  Output   3050 ml  Net 1250.23 ml     Exam: General: No acute respiratory distress-lethargic but awakens easily Lungs: Clear to auscultation bilaterally without wheezes or crackles, RA Cardiovascular: Irregular rate and rhythm consistent with atrial flutter; otherwise without murmur gallop or rub normal S1 and S2, no peripheral edema or JVD-rates variable between 89 and 130 Abdomen: Nontender, nondistended, soft, bowel sounds positive, no rebound, no ascites, no appreciable mass Musculoskeletal: No significant cyanosis, clubbing of bilateral lower extremities    Scheduled Meds:  Scheduled Meds: . busPIRone  15 mg Oral BID  . Chlorhexidine Gluconate Cloth  6 each Topical Q0600  . folic acid  1 mg Oral Daily  . LORazepam  0-4 mg Intravenous Q12H  . multivitamin with minerals  1 tablet Oral Daily  . mupirocin ointment  1 application Nasal BID  . pantoprazole  40 mg Oral BID  . thiamine  100 mg Oral Daily  . Warfarin - Pharmacist Dosing Inpatient   Does not apply q1800   Continuous Infusions: . 0.9 % NaCl with KCl 40 mEq / L 100 mL/hr at 12/31/13 1800  . diltiazem (CARDIZEM) infusion 11 mg/hr (12/31/13 1800)  . heparin 1,350 Units/hr (12/31/13 1800)    Data Reviewed: Basic Metabolic Panel:  Recent Labs Lab 12/29/13 1839 12/29/13 1851 12/30/13 1043 12/31/13 0444  NA 127* 124* 130* 132*  K 3.0* 2.5* 4.7 3.8  CL 82* 87* 94* 93*  CO2 23  --  23 26  GLUCOSE 186* 185* 91 99  BUN 10 8 7 6   CREATININE 0.90 0.90 0.71 0.71  CALCIUM 9.1  --  8.7 8.7  MG 1.1*  --  1.6  --    Liver Function Tests:  Recent Labs Lab 12/29/13 1839 12/30/13 1043 12/31/13 0444  AST 175* 124* 119*  ALT 188* 144* 137*  ALKPHOS 70 58 63  BILITOT 1.4* 0.9 0.6  PROT 6.3 5.5* 5.5*  ALBUMIN 3.4* 3.0* 3.1*    Recent Labs Lab 12/29/13 2043  LIPASE 46   No results found for this basename: AMMONIA,  in the  last 168 hours CBC:  Recent Labs Lab 12/29/13 1839 12/29/13 1851 12/30/13 1043 12/31/13 0444  WBC 12.0*  --  7.8 6.5  NEUTROABS 9.4*  --   --   --   HGB 17.4* 18.0* 14.5 13.7  HCT 47.1 53.0* 40.3 38.6*  MCV 93.8  --  95.7 96.7  PLT 233  --  166 170   Cardiac Enzymes:  Recent Labs Lab 12/29/13 2122 12/30/13 1043  TROPONINI <0.30 <0.30   BNP (last 3 results) No results found for this basename: PROBNP,  in the last 8760 hours CBG:  Recent Labs Lab 12/30/13 1150 12/30/13 1633 12/30/13 2015 12/30/13 2353 12/31/13 0411  GLUCAP 91 94 166* 107* 99    Recent Results (from the past 240 hour(s))  MRSA PCR SCREENING     Status: Abnormal   Collection Time    12/30/13 12:44 AM      Result Value Ref Range Status   MRSA by PCR POSITIVE (*) NEGATIVE Final   Comment:            The GeneXpert MRSA Assay (FDA     approved for NASAL specimens     only), is one component of a     comprehensive MRSA colonization     surveillance program. It is not     intended to diagnose MRSA     infection nor to guide or     monitor treatment for     MRSA infections.     Studies:  Recent x-ray studies have been reviewed in detail by the Attending Physician       Erin Hearing, ANP [facility] Office  [phone] Pager [phone]   **If unable to reach the above provider after paging please contact the [place] @ [phone]  On-Call/Text Page:      [email or web address]      password [email or web address]  If 7PM-7AM, please contact night-coverage [email or web address] Password [facility] 12/31/2013, 6:47 PM   LOS: 2 days   Examined patient and discussed assessment and plan with ANP Ebony Hail, agree with above plan. Patient with multiple complex medical problems,> 35 minutes was spent in direct medical care. Answered all questions from patient and family members.

## 2013-12-31 NOTE — Progress Notes (Signed)
Patient ID: MONTAE STAGER, male   DOB: 1976-02-11, 38 y.o.   MRN: [id number]   Patient Name: Matthew [name] Date of Encounter: 12/31/2013     Active Problems:   HYPERLIPIDEMIA-MIXED   HYPERTENSION, BENIGN   Complete transposition of great vessels   Nausea vomiting and diarrhea   Atrial flutter   Hypokalemia   Hypomagnesemia   Alcohol abuse   Alcohol withdrawal   Dehydration with hyponatremia   Depression   Hyperglycemia   Melena   Elevated transaminase level   Metabolic acidosis   Protein-calorie malnutrition, severe    SUBJECTIVE Sleeping after benzodiazepine Rx.   CURRENT MEDS . busPIRone  15 mg Oral BID  . Chlorhexidine Gluconate Cloth  6 each Topical Q0600  . folic acid  1 mg Oral Daily  . insulin aspart  0-9 Units Subcutaneous 6 times per day  . LORazepam  0-4 mg Intravenous Q6H   Followed by  . LORazepam  0-4 mg Intravenous Q12H  . multivitamin with minerals  1 tablet Oral Daily  . mupirocin ointment  1 application Nasal BID  . pantoprazole (PROTONIX) IV  40 mg Intravenous Q12H  . pneumococcal 23 valent vaccine  0.5 mL Intramuscular Tomorrow-1000  . thiamine  100 mg Oral Daily   Or  . thiamine  100 mg Intravenous Daily  . warfarin  7.5 mg Oral ONCE-1800  . Warfarin - Pharmacist Dosing Inpatient   Does not apply q1800    OBJECTIVE  Filed Vitals:   12/31/13 0300 12/31/13 0400 12/31/13 0500 12/31/13 0600  BP: 105/71 106/90  118/72  Pulse: 94 96  62  Temp:  98.1 F (36.7 C)    TempSrc:  Oral    Resp: 19 19  22   Height:      Weight:   204 lb 12.9 oz (92.9 kg)   SpO2: 94% 92%  94%    Intake/Output Summary (Last 24 hours) at 12/31/13 0735 Last data filed at 12/31/13 0601  Gross per 24 hour  Intake 790.24 ml  Output   3850 ml  Net -3059.76 ml   Filed Weights   12/29/13 1819 12/30/13 0008 12/31/13 0500  Weight: 200 lb (90.719 kg) 177 lb 14.6 oz (80.7 kg) 204 lb 12.9 oz (92.9 kg)    PHYSICAL EXAM  General: sedated and sleeping/snoring,  NAD. HEENT:  Normal  Neck: Supple without bruits or JVD. Lungs:  Resp regular and unlabored, CTA. Heart: IReg tachycardia, no s3, s4, or murmurs. Abdomen: Soft, non-tender, non-distended, BS + x 4.  Extremities: No clubbing, cyanosis or edema. DP/PT/Radials 2+ and equal bilaterally.  Accessory Clinical Findings  CBC  Recent Labs  12/29/13 1839  12/30/13 1043 12/31/13 0444  WBC 12.0*  --  7.8 6.5  NEUTROABS 9.4*  --   --   --   HGB 17.4*  < > 14.5 13.7  HCT 47.1  < > 40.3 38.6*  MCV 93.8  --  95.7 96.7  PLT 233  --  166 170  < > = values in this interval not displayed. Basic Metabolic Panel  Recent Labs  12/29/13 1839  12/30/13 1043 12/31/13 0444  NA 127*  < > 130* 132*  K 3.0*  < > 4.7 3.8  CL 82*  < > 94* 93*  CO2 23  --  23 26  GLUCOSE 186*  < > 91 99  BUN 10  < > 7 6  CREATININE 0.90  < > 0.71 0.71  CALCIUM 9.1  --  8.7 8.7  MG 1.1*  --  1.6  --   < > = values in this interval not displayed. Liver Function Tests  Recent Labs  12/30/13 1043 12/31/13 0444  AST 124* 119*  ALT 144* 137*  ALKPHOS 58 63  BILITOT 0.9 0.6  PROT 5.5* 5.5*  ALBUMIN 3.0* 3.1*    Recent Labs  12/29/13 2043  LIPASE 46   Cardiac Enzymes  Recent Labs  12/29/13 2122 12/30/13 1043  TROPONINI <0.30 <0.30   BNP No components found with this basename: POCBNP,  D-Dimer No results found for this basename: DDIMER,  in the last 72 hours Hemoglobin A1C  Recent Labs  12/29/13 2043  HGBA1C 5.5   Fasting Lipid Panel No results found for this basename: CHOL, HDL, LDLCALC, TRIG, CHOLHDL, LDLDIRECT,  in the last 72 hours Thyroid Function Tests  Recent Labs  12/29/13 2103  TSH 0.858    TELE Atrial flutter with a variable  ECG Atrial flutter with a variable ventricular response  Radiology/Studies  US Abdomen Complete  12/30/2013   CLINICAL DATA:  Elevated liver function tests.  EXAM: ULTRASOUND ABDOMEN COMPLETE  COMPARISON:  None.  FINDINGS: Gallbladder:  No  gallstones or wall thickening visualized. No sonographic Murphy sign noted.  Common bile duct:  Diameter: 3.4 mm  Liver:  Echogenic with decreased through transmission of the sound beam. No liver mass or focal lesion. Normal hepatopetal flow in the portal vein.  IVC:  No abnormality visualized.  Pancreas:  Visualized portion unremarkable.  Spleen:  Size and appearance within normal limits.  Right Kidney:  Length: 12 cm. Echogenicity within normal limits. No mass or hydronephrosis visualized.  Left Kidney:  Length: 12.3 cm. Echogenicity within normal limits. No mass or hydronephrosis visualized.  Abdominal aorta:  No aneurysm visualized.  Other findings:  None.  IMPRESSION: 1. No acute findings. 2. Extensive hepatic steatosis.   Electronically Signed   By: Lajean Manes M.D.   On: 12/30/2013 19:41   Dg Chest Portable 1 View  12/29/2013   CLINICAL DATA:  Generalized weakness. Nausea, vomiting, diarrhea. History of transposition of the great vessels.  EXAM: PORTABLE CHEST - 1 VIEW  COMPARISON:  10/01/2012.  FINDINGS: Cardiopericardial silhouette is within normal for size. Median sternotomy. 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 There is no airspace disease. No pleural effusion. Monitoring leads project over the chest.  IMPRESSION: No active cardiopulmonary disease.   Electronically Signed   By: Dereck Ligas M.D.   On: 12/29/2013 19:02    ASSESSMENT AND PLAN 1. Adult congenital heart disease 2. Atypical atrial flutter 3. S/p remote heart surgery 4. ETOH abuse Rec: his ventricular rate is under better control.  I would suggest we continue his IV heparin and cardizem, give prophylaxis for DT's, and plan DCCV, likely on Monday as he will require coumadin rather than a NOAC as per Dr. Renaldo Reel. Hopefully he will spontaneously revert to NSR. Plan TEE, likely on Monday.  Gregg Taylor,M.D.  Gregg Taylor,M.D.  12/31/2013 7:35 AM

## 2013-12-31 NOTE — Progress Notes (Addendum)
[place] for heparin Indication: atrial fibrillation  Allergies  Allergen Reactions  . Aspirin     Patient Measurements: Height: 6\' 3"  (190.5 cm) Weight: 177 lb 14.6 oz (80.7 kg) IBW/kg (Calculated) : 84.5 Heparin Dosing Weight: 107 kg  Vital Signs: Temp: 98.1 F (36.7 C) (06/19 0000) Temp src: Oral (06/19 0000) BP: 117/87 mmHg (06/19 0000) Pulse Rate: 127 (06/19 0000)  Labs:  Recent Labs  12/29/13 1839 12/29/13 1851 12/29/13 2122 12/30/13 1043 12/30/13 2215  HGB 17.4* 18.0*  --  14.5  --   HCT 47.1 53.0*  --  40.3  --   PLT 233  --   --  166  --   LABPROT 12.5  --   --   --   --   INR 0.95  --   --   --   --   HEPARINUNFRC  --   --   --   --  0.70  CREATININE 0.90 0.90  --  0.71  --   TROPONINI  --   --  <0.30 <0.30  --     Estimated Creatinine Clearance: 147.1 ml/min (by C-G formula based on Cr of 0.71).   Medical History: Past Medical History  Diagnosis Date  . Complete transposition of great vessels   . Hypertension   . Dyslipidemia   . Depression   . Alcohol abuse   . [place] spotted fever 11/2013    Medications:  Prescriptions prior to admission  Medication Sig Dispense Refill  . ALPRAZolam (XANAX) 0.5 MG tablet Take 0.5 mg by mouth 3 (three) times daily as needed for anxiety.       . busPIRone (BUSPAR) 15 MG tablet Take 15 mg by mouth 2 (two) times daily.      Marland Kitchen FLUoxetine (PROZAC) 20 MG capsule Take 20 mg by mouth daily.      Marland Kitchen loperamide (IMODIUM) 2 MG capsule Take 1 capsule (2 mg total) by mouth 4 (four) times daily as needed for diarrhea or loose stools.  12 capsule  0  . losartan (COZAAR) 50 MG tablet Take 50 mg by mouth daily.       Scheduled:  . busPIRone  15 mg Oral BID  . Chlorhexidine Gluconate Cloth  6 each Topical Q0600  . folic acid  1 mg Oral Daily  . insulin aspart  0-9 Units Subcutaneous 6 times per day  . LORazepam  0-4 mg Intravenous Q6H   Followed by  . LORazepam  0-4 mg  Intravenous Q12H  . multivitamin with minerals  1 tablet Oral Daily  . mupirocin ointment  1 application Nasal BID  . pantoprazole (PROTONIX) IV  40 mg Intravenous Q12H  . pneumococcal 23 valent vaccine  0.5 mL Intramuscular Tomorrow-1000  . thiamine  100 mg Oral Daily   Or  . thiamine  100 mg Intravenous Daily  . warfarin  7.5 mg Oral ONCE-1800  . Warfarin - Pharmacist Dosing Inpatient   Does not apply q1800   Infusions:  . 0.9 % NaCl with KCl 40 mEq / L 175 mL/hr (12/29/13 2157)  . diltiazem (CARDIZEM) infusion 11 mg/hr (12/31/13 0039)  . heparin 1,600 Units/hr (12/31/13 0040)    Assessment: 38 yo who was admitted for afib and other issues. Pt to be started on IV heparin today for anticoagulation. He is not a candidate for oral anticoag due to ETOH. Initial heparin level therpaeutic   Goal of Therapy:  Heparin level 0.3-0.7 units/ml  Monitor platelets by anticoagulation protocol: Yes   Plan:  Cont Heparin drip at 1600 units/hr F/u with 6hr heparin level to confirm Daily HL and CBC   Thanks for allowing pharmacy to be a part of this patient's care.  Excell Seltzer, PharmD Clinical Pharmacist, [phone]  Addum:  HL this am 0.91.  Will decrease drip rate to 1450 units/hr.  Recheck level in 6 hours

## 2014-01-01 DIAGNOSIS — R7309 Other abnormal glucose: Secondary | ICD-10-CM

## 2014-01-01 DIAGNOSIS — Q248 Other specified congenital malformations of heart: Secondary | ICD-10-CM

## 2014-01-01 DIAGNOSIS — I509 Heart failure, unspecified: Secondary | ICD-10-CM

## 2014-01-01 DIAGNOSIS — I5022 Chronic systolic (congestive) heart failure: Secondary | ICD-10-CM | POA: Diagnosis present

## 2014-01-01 LAB — HEPARIN LEVEL (UNFRACTIONATED)
Heparin Unfractionated: 0.73 IU/mL — ABNORMAL HIGH (ref 0.30–0.70)
Heparin Unfractionated: 0.53 IU/mL (ref 0.30–0.70)

## 2014-01-01 LAB — CBC
HCT: 42.1 % (ref 39.0–52.0)
Hemoglobin: 14.6 g/dL (ref 13.0–17.0)
MCH: 34 pg (ref 26.0–34.0)
MCHC: 34.7 g/dL (ref 30.0–36.0)
MCV: 98.1 fL (ref 78.0–100.0)
Platelets: 221 10*3/uL (ref 150–400)
RBC: 4.29 MIL/uL (ref 4.22–5.81)
RDW: 13.3 % (ref 11.5–15.5)
WBC: 8.1 10*3/uL (ref 4.0–10.5)

## 2014-01-01 LAB — BASIC METABOLIC PANEL
BUN: 3 mg/dL — ABNORMAL LOW (ref 6–23)
CO2: 28 mEq/L (ref 19–32)
Calcium: 9.5 mg/dL (ref 8.4–10.5)
Chloride: 90 mEq/L — ABNORMAL LOW (ref 96–112)
Creatinine, Ser: 0.7 mg/dL (ref 0.50–1.35)
GFR calc Af Amer: >90 mL/min (ref >90)
GFR calc non Af Amer: >90 mL/min (ref >90)
Glucose, Bld: 107 mg/dL — ABNORMAL HIGH (ref 70–99)
Potassium: 4.6 mEq/L (ref 3.7–5.3)
Sodium: 133 mEq/L — ABNORMAL LOW (ref 137–147)

## 2014-01-01 LAB — PROTIME-INR
INR: 0.93 (ref 0.00–1.49)
Prothrombin Time: 12.3 seconds (ref 11.6–15.2)

## 2014-01-01 MED ORDER — DILTIAZEM HCL ER 90 MG PO CP12
180.0000 mg | ORAL_CAPSULE | Freq: Two times a day (BID) | ORAL | Status: DC
  Administered 2014-01-01 – 2014-01-05 (×8): 180 mg via ORAL
  Filled 2014-01-01 (×10): qty 2

## 2014-01-01 MED ORDER — WARFARIN SODIUM 7.5 MG PO TABS
7.5000 mg | ORAL_TABLET | Freq: Once | ORAL | Status: AC
  Administered 2014-01-01: 7.5 mg via ORAL
  Filled 2014-01-01: qty 1

## 2014-01-01 MED ORDER — DILTIAZEM HCL ER 60 MG PO CP12
120.0000 mg | ORAL_CAPSULE | Freq: Two times a day (BID) | ORAL | Status: DC
  Administered 2014-01-01: 120 mg via ORAL
  Filled 2014-01-01 (×2): qty 2

## 2014-01-01 MED ORDER — FLUOXETINE HCL 20 MG PO CAPS
20.0000 mg | ORAL_CAPSULE | Freq: Every day | ORAL | Status: DC
  Administered 2014-01-02 – 2014-01-05 (×4): 20 mg via ORAL
  Filled 2014-01-01 (×4): qty 1

## 2014-01-01 NOTE — Progress Notes (Signed)
Pharmacy: [facility]  Patient is a 38 y.o M on heparin for Afib.  Heparin level now back therapeutic at 0.53 after rate decreased to 1250 units/hr this morning.  Will continue with current heparin rate for now.  Dia Sitter, PharmD, BCPS

## 2014-01-01 NOTE — Progress Notes (Signed)
[facility] TEAM 1 - Stepdown/ICU TEAM Progress Note  STAFFORD RIVIERA NOI:[id number] DOB: 23-Dec-1975 DOA: 12/29/2013 PCP: Glo Herring., MD  Admit HPI / Brief Narrative: 38 y.o. WM PMHx depression, alcohol abuse, transposition of great vessels, chronic systolic CHF, HTN, pulmonary hypertension, HLD. Presented with nausea and vomiting; generalized weakness. 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 He presented with a 2 to three-day history of loose stools numbering approximately 3 daily, nausea and vomiting with 5-6 episodes of emesis daily and generalized weakness. He denied abdominal pain, pain with urination, fever, or chills. He did have black tarry stools twice, but no bright red blood per rectum. He denied coffee grounds emesis. He denied chest pain or shortness of breath. He has had shakiness as he has been trying to decrease his alcohol intake. Over the past several months, he has been drinking heavily because of recent stressors in his family. His wife has left him with their 3 children and his father recently died unexpectedly of a massive stroke. He admittted to depression, but denied suicidal ideation. His primary care provider had recently increased the dose of Prozac from 20 mg to 40 mg daily. He believes that the increase may have initiated the gastrointestinal symptoms. He had recently been treated with doxycycline approximately 3 weeks ago for treatment of [facility] spotted fever. He has been drinking 6-12 beers daily with shots of vodka or he drinks a pint of vodka or other hard liquor daily. He has been attempting to cut down but finds it very difficult to do so. He denied alcohol withdrawal seizures or previous DTs. He denies any history of diabetes mellitus.  In the ED, the patient was initially mildly tachycardic, but developed a heart rate in the 200-250 range. Per the ED  physician, it appeared that his rhythm may have been PSVT. The initial EKG revealed questionable atrial fibrillation and other abnormalities with a heart rate of 228 beats per minute. He was given diltiazem IV and a total of 18 mg of IV adenosine. The followup EKG revealed sinus tachycardia with a heart rate of 123 beats per minute, prolonged QT interval, and nonspecific ST and T wave abnormalities. His chest x-ray reveals no acute cardiopulmonary disease. His lab data are significant for a serum sodium of 124, potassium of 2.5, glucose of 185, magnesium of 1.1, normal lipase, ALT of 188, AST is 175, point-of-care troponin I 0, and lactic acid 2.10. He is being admitted for further evaluation and management.   HPI/Subjective: 6/20 patient resting comfortably in bed, states he understands the new plan is for him to become therapeutic on anti-coagulation prior to discharge.  Assessment/Plan: Complete transposition of great vessels  -Cards consulted given h/o acute atrial arrhythmia   Nausea vomiting and diarrhea/Melena  -Resolved  -6/18 MRSA by PCR positive  Atrial flutter  -EKG 6/18 revealed atrial flutter with 2:1 conduction pattern  -Chronic alcoholism also contributing factor  -Continue IV heparin and Coumadin  -Per cardiology patient will be anticoagulated for one month prior to DCCV -6/18 echocardiogram; chronic systolic CHF, pulmonary hypertension see results below  -6/20 started on Cardizem PO 120 mg BID today by cardiology, however patient's HR in the 120s several hours post first dose will increase to Cardizem PO 180 mg BID  Chronic systolic CHF  -Per cardiology  -Control atrial flutter see above -HTN currently controlled   Pulmonary hypertension  -Once atrial flutter controlled if  patient's BP will tolerate will add Imdur    Dehydration with hyponatremia/Metabolic acidosis  -Lactic acid had increased on 6/18 after admission and was likely related to recent tachycardia and  inadequate perfusion related to same- today is down to 0.7  -6/20 result  Elevated transaminase level  -Likely from ongoing alcohol use but recent tachycardia and dehydration could explain his weight  -Abdominal ultrasound severe FLD but no cirrhosis  -Follow labs   Hypokalemia/Hypomagnesemia/ Hyperglycemia  -Follow labs and replete as indicated   Alcohol abuse/Alcohol withdrawal  -Continue CIWA  -Once medically stable we'll ask psych social worker to evaluate the patient and provide outpatient program  -Patient apparently established with primary care physician who is managing medication for depression   HYPERLIPIDEMIA-MIXED  -Obtain lipid panel  HYPERTENSION, BENIGN  -Cozaar on hold 2/2 relative hypotension-BP remains soft   Depression  -6/20 restart Prozac 20 mg daily   -Continue BuSpar 15mg  BID   Code Status: FULL Family Communication: no family present at time of exam Disposition Plan: Therapeutic on Coumadin    Consultants: Dr. Crissie Sickles (cardiology)    Procedure/Significant Events:    Culture -6/18 MRSA by PCR positive  Antibiotics: NA  DVT prophylaxis: Heparin and Coumadin   Devices NA   LINES / TUBES:  6/17 20ga right antecubital    Continuous Infusions: . 0.9 % NaCl with KCl 40 mEq / L 100 mL/hr (01/01/14 1056)  . diltiazem (CARDIZEM) infusion Stopped (01/01/14 1045)  . heparin 1,250 Units/hr (01/01/14 1417)    Objective: VITAL SIGNS: Temp: 98 F (36.7 C) (06/20 1615) Temp src: Oral (06/20 1615) BP: 114/87 mmHg (06/20 1615) Pulse Rate: 126 (06/20 1615) SPO2; FIO2:   Intake/Output Summary (Last 24 hours) at 01/01/14 1853 Last data filed at 01/01/14 1800  Gross per 24 hour  Intake 2755.5 ml  Output   2100 ml  Net  655.5 ml     Exam: General: A./O. x4, NAD, No acute respiratory distress Lungs: Clear to auscultation bilaterally without wheezes or crackles Cardiovascular: Tachycardia arrhythmia, Regular rhythm without  murmur gallop or rub normal S1 and S2 Renalbalance today;        /overall;        Creatinine ;        Hourly output   Abdomen: Nontender, nondistended, soft, bowel sounds positive, no rebound, no ascites, no appreciable mass Extremities: No significant cyanosis, clubbing, or edema bilateral lower extremities  Data Reviewed: Basic Metabolic Panel:  Recent Labs Lab 12/29/13 1839 12/29/13 1851 12/30/13 1043 12/31/13 0444 01/01/14 0230  NA 127* 124* 130* 132* 133*  K 3.0* 2.5* 4.7 3.8 4.6  CL 82* 87* 94* 93* 90*  CO2 23  --  23 26 28   GLUCOSE 186* 185* 91 99 107*  BUN 10 8 7 6  3*  CREATININE 0.90 0.90 0.71 0.71 0.70  CALCIUM 9.1  --  8.7 8.7 9.5  MG 1.1*  --  1.6  --   --    Liver Function Tests:  Recent Labs Lab 12/29/13 1839 12/30/13 1043 12/31/13 0444  AST 175* 124* 119*  ALT 188* 144* 137*  ALKPHOS 70 58 63  BILITOT 1.4* 0.9 0.6  PROT 6.3 5.5* 5.5*  ALBUMIN 3.4* 3.0* 3.1*    Recent Labs Lab 12/29/13 2043  LIPASE 46   No results found for this basename: AMMONIA,  in the last 168 hours CBC:  Recent Labs Lab 12/29/13 1839 12/29/13 1851 12/30/13 1043 12/31/13 0444 01/01/14 0230  WBC 12.0*  --  7.8 6.5 8.1  NEUTROABS 9.4*  --   --   --   --   HGB 17.4* 18.0* 14.5 13.7 14.6  HCT 47.1 53.0* 40.3 38.6* 42.1  MCV 93.8  --  95.7 96.7 98.1  PLT 233  --  166 170 221   Cardiac Enzymes:  Recent Labs Lab 12/29/13 2122 12/30/13 1043  TROPONINI <0.30 <0.30   BNP (last 3 results) No results found for this basename: PROBNP,  in the last 8760 hours CBG:  Recent Labs Lab 12/30/13 1150 12/30/13 1633 12/30/13 2015 12/30/13 2353 12/31/13 0411  GLUCAP 91 94 166* 107* 99    Recent Results (from the past 240 hour(s))  MRSA PCR SCREENING     Status: Abnormal   Collection Time    12/30/13 12:44 AM      Result Value Ref Range Status   MRSA by PCR POSITIVE (*) NEGATIVE Final   Comment:            The GeneXpert MRSA Assay (FDA     approved for NASAL  specimens     only), is one component of a     comprehensive MRSA colonization     surveillance program. It is not     intended to diagnose MRSA     infection nor to guide or     monitor treatment for     MRSA infections.     Studies:  Recent x-ray studies have been reviewed in detail by the Attending Physician  Scheduled Meds:  Scheduled Meds: . busPIRone  15 mg Oral BID  . Chlorhexidine Gluconate Cloth  6 each Topical Q0600  . diltiazem  120 mg Oral Q12H  . folic acid  1 mg Oral Daily  . LORazepam  0-4 mg Intravenous Q12H  . multivitamin with minerals  1 tablet Oral Daily  . mupirocin ointment  1 application Nasal BID  . pantoprazole  40 mg Oral BID  . thiamine  100 mg Oral Daily  . Warfarin - Pharmacist Dosing Inpatient   Does not apply q1800    Time spent on care of this patient: 40 mins   Allie Bossier , MD   [facility] Office  [phone] Pager [phone]  On-Call/Text Page:      [email or web address]      password [email or web address]  If 7PM-7AM, please contact night-coverage [email or web address] Password [email or web address] 01/01/2014, 6:53 PM   LOS: 3 days

## 2014-01-01 NOTE — Progress Notes (Addendum)
[facility] for heparin Indication: atrial fibrillation  Allergies  Allergen Reactions  . Aspirin     Patient Measurements: Height: 6\' 3"  (190.5 cm) Weight: 204 lb 5.9 oz (92.7 kg) IBW/kg (Calculated) : 84.5 Heparin Dosing Weight: 107 kg  Vital Signs: Temp: 98.6 F (37 C) (06/20 0733) Temp src: Oral (06/20 0733) BP: 110/84 mmHg (06/20 0800) Pulse Rate: 59 (06/20 0800)  Labs:  Recent Labs  12/29/13 1839  12/29/13 2122 12/30/13 1043  12/31/13 0444 12/31/13 0500 12/31/13 1336 01/01/14 0230  HGB 17.4*  < >  --  14.5  --  13.7  --   --  14.6  HCT 47.1  < >  --  40.3  --  38.6*  --   --  42.1  PLT 233  --   --  166  --  170  --   --  221  LABPROT 12.5  --   --   --   --   --  13.7  --  12.3  INR 0.95  --   --   --   --   --  1.07  --  0.93  HEPARINUNFRC  --   --   --   --   < >  --  0.91* 0.72* 0.73*  CREATININE 0.90  < >  --  0.71  --  0.71  --   --  0.70  TROPONINI  --   --  <0.30 <0.30  --   --   --   --   --   < > = values in this interval not displayed.  Estimated Creatinine Clearance: 154 ml/min (by C-G formula based on Cr of 0.7).   Medical History: Past Medical History  Diagnosis Date  . Complete transposition of great vessels   . Hypertension   . Dyslipidemia   . Depression   . Alcohol abuse   . [place] spotted fever 11/2013    Medications:  Prescriptions prior to admission  Medication Sig Dispense Refill  . ALPRAZolam (XANAX) 0.5 MG tablet Take 0.5 mg by mouth 3 (three) times daily as needed for anxiety.       . busPIRone (BUSPAR) 15 MG tablet Take 15 mg by mouth 2 (two) times daily.      Marland Kitchen FLUoxetine (PROZAC) 20 MG capsule Take 20 mg by mouth daily.      Marland Kitchen loperamide (IMODIUM) 2 MG capsule Take 1 capsule (2 mg total) by mouth 4 (four) times daily as needed for diarrhea or loose stools.  12 capsule  0  . losartan (COZAAR) 50 MG tablet Take 50 mg by mouth daily.       Scheduled:  . busPIRone  15 mg Oral  BID  . Chlorhexidine Gluconate Cloth  6 each Topical Q0600  . folic acid  1 mg Oral Daily  . LORazepam  0-4 mg Intravenous Q12H  . multivitamin with minerals  1 tablet Oral Daily  . mupirocin ointment  1 application Nasal BID  . pantoprazole  40 mg Oral BID  . thiamine  100 mg Oral Daily  . Warfarin - Pharmacist Dosing Inpatient   Does not apply q1800   Infusions:  . 0.9 % NaCl with KCl 40 mEq / L 100 mL/hr (01/01/14 0045)  . diltiazem (CARDIZEM) infusion 10 mg/hr (01/01/14 0045)  . heparin 1,350 Units/hr (12/31/13 1800)    Assessment: 38 yo who was admitted for afib and other issues. Pt to continue  on heparin and warfarin for afib.  His heparin level remains slightly supratherapeutic after a dose decrease yesterday.  INR remains subtherapeutic after only one dose of 7.5mg  yesterday.  No signs of bleeding complications per patient's nurse.  Noted plan for DCCV/TEE on Monday.   Goal of Therapy:  Heparin level 0.3-0.7 units/ml Monitor platelets by anticoagulation protocol: Yes INR 2-3   Plan:  Decrease Heparin drip 1250 units/hr F/u with 6hr heparin level Warfarin 7.5mg  tonight Daily HL, INR, and CBC   Thanks for allowing pharmacy to be a part of this patient's care.  Vivia Ewing, PharmD Clinical Pharmacist - Resident Pager: [phone] Pharmacy: [phone] 01/01/2014 8:25 AM

## 2014-01-01 NOTE — Progress Notes (Signed)
Patient ID: TYLOR COURTWRIGHT, male   DOB: [birth year], 38 y.o.   MRN: [id number]   Patient Name: ALTA SHOBER Date of Encounter: 01/01/2014     Active Problems:   HYPERLIPIDEMIA-MIXED   HYPERTENSION, BENIGN   Complete transposition of great vessels   Nausea vomiting and diarrhea   Atrial flutter   Hypokalemia   Hypomagnesemia   Alcohol abuse   Alcohol withdrawal   Dehydration with hyponatremia   Depression   Hyperglycemia   Melena   Elevated transaminase level   Metabolic acidosis   Protein-calorie malnutrition, severe   Pulmonary hypertension    SUBJECTIVE "I had a better night". Denies chest pain os sob.  CURRENT MEDS . busPIRone  15 mg Oral BID  . Chlorhexidine Gluconate Cloth  6 each Topical Q0600  . folic acid  1 mg Oral Daily  . LORazepam  0-4 mg Intravenous Q12H  . multivitamin with minerals  1 tablet Oral Daily  . mupirocin ointment  1 application Nasal BID  . pantoprazole  40 mg Oral BID  . thiamine  100 mg Oral Daily  . Warfarin - Pharmacist Dosing Inpatient   Does not apply q1800    OBJECTIVE  Filed Vitals:   01/01/14 0000 01/01/14 0400 01/01/14 0733 01/01/14 0800  BP: 116/79 129/102 137/96 110/84  Pulse: 44 29 127 59  Temp: 98.2 F (36.8 C) 98.4 F (36.9 C) 98.6 F (37 C)   TempSrc: Oral Oral Oral   Resp: 14 23 21 24   Height:      Weight:  204 lb 5.9 oz (92.7 kg)    SpO2: 90% 92% 91% 93%    Intake/Output Summary (Last 24 hours) at 01/01/14 0824 Last data filed at 01/01/14 0800  Gross per 24 hour  Intake 3204.73 ml  Output   1300 ml  Net 1904.73 ml   Filed Weights   12/30/13 0008 12/31/13 0500 01/01/14 0400  Weight: 177 lb 14.6 oz (80.7 kg) 204 lb 12.9 oz (92.9 kg) 204 lb 5.9 oz (92.7 kg)    PHYSICAL EXAM  General: awake, a bit tremulous, NAD. HEENT:  Normal  Neck: Supple without bruits or JVD. Lungs:  Resp regular and unlabored, CTA. Heart: IReg tachycardia, no s3, s4, or murmurs. Abdomen: Soft, non-tender,  non-distended, BS + x 4.  Extremities: No clubbing, cyanosis or edema. DP/PT/Radials 2+ and equal bilaterally.  Accessory Clinical Findings  CBC  Recent Labs  12/29/13 1839  12/31/13 0444 01/01/14 0230  WBC 12.0*  < > 6.5 8.1  NEUTROABS 9.4*  --   --   --   HGB 17.4*  < > 13.7 14.6  HCT 47.1  < > 38.6* 42.1  MCV 93.8  < > 96.7 98.1  PLT 233  < > 170 221  < > = values in this interval not displayed. Basic Metabolic Panel  Recent Labs  12/29/13 1839  12/30/13 1043 12/31/13 0444 01/01/14 0230  NA 127*  < > 130* 132* 133*  K 3.0*  < > 4.7 3.8 4.6  CL 82*  < > 94* 93* 90*  CO2 23  --  23 26 28   GLUCOSE 186*  < > 91 99 107*  BUN 10  < > 7 6 3*  CREATININE 0.90  < > 0.71 0.71 0.70  CALCIUM 9.1  --  8.7 8.7 9.5  MG 1.1*  --  1.6  --   --   < > = values in this interval not displayed. Liver Function Tests  Recent Labs  12/30/13 1043 12/31/13 0444  AST 124* 119*  ALT 144* 137*  ALKPHOS 58 63  BILITOT 0.9 0.6  PROT 5.5* 5.5*  ALBUMIN 3.0* 3.1*    Recent Labs  12/29/13 2043  LIPASE 46   Cardiac Enzymes  Recent Labs  12/29/13 2122 12/30/13 1043  TROPONINI <0.30 <0.30   BNP No components found with this basename: POCBNP,  D-Dimer No results found for this basename: DDIMER,  in the last 72 hours Hemoglobin A1C  Recent Labs  12/29/13 2043  HGBA1C 5.5   Fasting Lipid Panel No results found for this basename: CHOL, HDL, LDLCALC, TRIG, CHOLHDL, LDLDIRECT,  in the last 72 hours Thyroid Function Tests  Recent Labs  12/29/13 2103  TSH 0.858    TELE Atrial flutter with a variable ventricular rate  ECG Atrial flutter with a variable ventricular response  Radiology/Studies  US Abdomen Complete  12/30/2013   CLINICAL DATA:  Elevated liver function tests.  EXAM: ULTRASOUND ABDOMEN COMPLETE  COMPARISON:  None.  FINDINGS: Gallbladder:  No gallstones or wall thickening visualized. No sonographic Murphy sign noted.  Common bile duct:  Diameter: 3.4 mm   Liver:  Echogenic with decreased through transmission of the sound beam. No liver mass or focal lesion. Normal hepatopetal flow in the portal vein.  IVC:  No abnormality visualized.  Pancreas:  Visualized portion unremarkable.  Spleen:  Size and appearance within normal limits.  Right Kidney:  Length: 12 cm. Echogenicity within normal limits. No mass or hydronephrosis visualized.  Left Kidney:  Length: 12.3 cm. Echogenicity within normal limits. No mass or hydronephrosis visualized.  Abdominal aorta:  No aneurysm visualized.  Other findings:  None.  IMPRESSION: 1. No acute findings. 2. Extensive hepatic steatosis.   Electronically Signed   By: Lajean Manes M.D.   On: 12/30/2013 19:41   Dg Chest Portable 1 View  12/29/2013   CLINICAL DATA:  Generalized weakness. Nausea, vomiting, diarrhea. History of transposition of the great vessels.  EXAM: PORTABLE CHEST - 1 VIEW  COMPARISON:  10/01/2012.  FINDINGS: Cardiopericardial silhouette is within normal for size. Median sternotomy. 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 There is no airspace disease. No pleural effusion. Monitoring leads project over the chest.  IMPRESSION: No active cardiopulmonary disease.   Electronically Signed   By: Dereck Ligas M.D.   On: 12/29/2013 19:02    ASSESSMENT AND PLAN 1. Adult congenital heart disease 2. Atypical atrial flutter 3. S/p remote heart surgery 4. ETOH abuse Rec: his ventricular rate is under better control.  I would suggest we continue his IV heparin and cardizem, give prophylaxis for DT's. I have reviewed the case with my non-invasive cardiology partners. His congenital heart disease and prior surgery with baffles placed to redirect the blood flow makes the utility of TEE of questionable reliability in predicting/visualizing thrombus. Therefore he will need rate control for a month with therapeutic coumadin before undergoing DCCV. Ultimately Amiodarone may be  needed for maintenance of NSR. I can go home when his rate is controlled and he is therapeutic or nearly so on coumadin. I have warned him that coumadin and ETOH abuse do not mix and that he should stop all alcohol consumption.  Gregg Taylor,M.D.  01/01/2014 8:24 AM

## 2014-01-01 NOTE — Discharge Instructions (Signed)
Information on my medicine - Coumadin   (Warfarin)  This medication education was reviewed with me or my healthcare representative as part of my discharge preparation.  The pharmacist that spoke with me during my hospital stay was:  Erik Obey, [facility]  Why was Coumadin prescribed for you? Coumadin was prescribed for you because you have a blood clot or a medical condition that can cause an increased risk of forming blood clots. Blood clots can cause serious health problems by blocking the flow of blood to the heart, lung, or brain. Coumadin can prevent harmful blood clots from forming. As a reminder your indication for Coumadin is:   Stroke Prevention Because Of Atrial Fibrillation  What test will check on my response to Coumadin? While on Coumadin (warfarin) you will need to have an INR test regularly to ensure that your dose is keeping you in the desired range. The INR (international normalized ratio) number is calculated from the result of the laboratory test called prothrombin time (PT).  If an INR APPOINTMENT HAS NOT ALREADY BEEN MADE FOR YOU please schedule an appointment to have this lab work done by your health care provider within 7 days. Your INR goal is usually a number between:  2 to 3 or your provider may give you a more narrow range like 2-2.5.  Ask your health care provider during an office visit what your goal INR is.  What  do you need to  know  About  COUMADIN? Take Coumadin (warfarin) exactly as prescribed by your healthcare provider about the same time each day.  DO NOT stop taking without talking to the doctor who prescribed the medication.  Stopping without other blood clot prevention medication to take the place of Coumadin may increase your risk of developing a new clot or stroke.  Get refills before you run out.  What do you do if you miss a dose? If you miss a dose, take it as soon as you remember on the same day then continue your regularly scheduled regimen the next  day.  Do not take two doses of Coumadin at the same time.  Important Safety Information A possible side effect of Coumadin (Warfarin) is an increased risk of bleeding. You should call your healthcare provider right away if you experience any of the following:   Bleeding from an injury or your nose that does not stop.   Unusual colored urine (red or dark brown) or unusual colored stools (red or black).   Unusual bruising for unknown reasons.   A serious fall or if you hit your head (even if there is no bleeding).  Some foods or medicines interact with Coumadin (warfarin) and might alter your response to warfarin. To help avoid this:   Eat a balanced diet, maintaining a consistent amount of Vitamin K.   Notify your provider about major diet changes you plan to make.   Avoid alcohol or limit your intake to 1 drink for women and 2 drinks for men per day. (1 drink is 5 oz. wine, 12 oz. beer, or 1.5 oz. liquor.)  Make sure that ANY health care provider who prescribes medication for you knows that you are taking Coumadin (warfarin).  Also make sure the healthcare provider who is monitoring your Coumadin knows when you have started a new medication including herbals and non-prescription products.  Coumadin (Warfarin)  Major Drug Interactions  Increased Warfarin Effect Decreased Warfarin Effect  Alcohol (large quantities) Antibiotics (esp. Septra/Bactrim, Flagyl, Cipro) Amiodarone (Cordarone) Aspirin (  ASA) °Cimetidine (Tagamet) °Megestrol (Megace) °NSAIDs (ibuprofen, naproxen, etc.) °Piroxicam (Feldene) °Propafenone (Rythmol SR) °Propranolol (Inderal) °Isoniazid (INH) °Posaconazole (Noxafil) Barbiturates (Phenobarbital) °Carbamazepine (Tegretol) °Chlordiazepoxide (Librium) °Cholestyramine (Questran) °Griseofulvin °Oral Contraceptives °Rifampin °Sucralfate (Carafate) °Vitamin K  ° °Coumadin® (Warfarin) Major Herbal Interactions  °Increased Warfarin Effect Decreased Warfarin Effect   °Garlic °Ginseng °Ginkgo biloba Coenzyme Q10 °Green tea °St. John’s wort   ° °Coumadin® (Warfarin) FOOD Interactions  °Eat a consistent number of servings per week of foods HIGH in Vitamin K °(1 serving = ½ cup)  °Collards (cooked, or boiled & drained) °Kale (cooked, or boiled & drained) °Mustard greens (cooked, or boiled & drained) °Parsley *serving size only = ¼ cup °Spinach (cooked, or boiled & drained) °Swiss chard (cooked, or boiled & drained) °Turnip greens (cooked, or boiled & drained)  °Eat a consistent number of servings per week of foods MEDIUM-HIGH in Vitamin K °(1 serving = 1 cup)  °Asparagus (cooked, or boiled & drained) °Broccoli (cooked, boiled & drained, or raw & chopped) °Brussel sprouts (cooked, or boiled & drained) *serving size only = ½ cup °Lettuce, raw (green leaf, endive, romaine) °Spinach, raw °Turnip greens, raw & chopped  ° °These websites have more information on Coumadin (warfarin):  www.coumadin.com; °www.ahrq.gov/consumer/coumadin.htm; ° ° ° °

## 2014-01-02 LAB — CBC
HCT: 38.9 % — ABNORMAL LOW (ref 39.0–52.0)
Hemoglobin: 13.4 g/dL (ref 13.0–17.0)
MCH: 34 pg (ref 26.0–34.0)
MCHC: 34.4 g/dL (ref 30.0–36.0)
MCV: 98.7 fL (ref 78.0–100.0)
Platelets: 213 10*3/uL (ref 150–400)
RBC: 3.94 MIL/uL — ABNORMAL LOW (ref 4.22–5.81)
RDW: 13.4 % (ref 11.5–15.5)
WBC: 6.5 10*3/uL (ref 4.0–10.5)

## 2014-01-02 LAB — COMPREHENSIVE METABOLIC PANEL
ALT: 100 U/L — ABNORMAL HIGH (ref 0–53)
AST: 64 U/L — ABNORMAL HIGH (ref 0–37)
Albumin: 3 g/dL — ABNORMAL LOW (ref 3.5–5.2)
Alkaline Phosphatase: 57 U/L (ref 39–117)
BUN: 4 mg/dL — ABNORMAL LOW (ref 6–23)
CO2: 26 mEq/L (ref 19–32)
Calcium: 9.2 mg/dL (ref 8.4–10.5)
Chloride: 96 mEq/L (ref 96–112)
Creatinine, Ser: 0.79 mg/dL (ref 0.50–1.35)
GFR calc Af Amer: >90 mL/min (ref >90)
GFR calc non Af Amer: >90 mL/min (ref >90)
Glucose, Bld: 110 mg/dL — ABNORMAL HIGH (ref 70–99)
Potassium: 4.6 mEq/L (ref 3.7–5.3)
Sodium: 135 mEq/L — ABNORMAL LOW (ref 137–147)
Total Bilirubin: 0.3 mg/dL (ref 0.3–1.2)
Total Protein: 5.8 g/dL — ABNORMAL LOW (ref 6.0–8.3)

## 2014-01-02 LAB — PROTIME-INR
INR: 1.27 (ref 0.00–1.49)
Prothrombin Time: 15.6 seconds — ABNORMAL HIGH (ref 11.6–15.2)

## 2014-01-02 LAB — MAGNESIUM: Magnesium: 1.5 mg/dL (ref 1.5–2.5)

## 2014-01-02 LAB — LIPID PANEL
Cholesterol: 148 mg/dL (ref 0–200)
HDL: 46 mg/dL (ref >39)
LDL Cholesterol: 87 mg/dL (ref 0–99)
Total CHOL/HDL Ratio: 3.2 RATIO
Triglycerides: 75 mg/dL (ref <150)
VLDL: 15 mg/dL (ref 0–40)

## 2014-01-02 LAB — HEPARIN LEVEL (UNFRACTIONATED): Heparin Unfractionated: 0.41 IU/mL (ref 0.30–0.70)

## 2014-01-02 MED ORDER — WARFARIN SODIUM 7.5 MG PO TABS
7.5000 mg | ORAL_TABLET | Freq: Once | ORAL | Status: AC
  Administered 2014-01-02: 7.5 mg via ORAL
  Filled 2014-01-02: qty 1

## 2014-01-02 MED ORDER — LORAZEPAM 2 MG/ML IJ SOLN: 1.0000 mg | Freq: Four times a day (QID) | INTRAMUSCULAR | Status: DC | PRN

## 2014-01-02 MED ORDER — MAGNESIUM SULFATE 40 MG/ML IJ SOLN
2.0000 g | Freq: Once | INTRAMUSCULAR | Status: AC
  Administered 2014-01-02: 2 g via INTRAVENOUS
  Filled 2014-01-02: qty 50

## 2014-01-02 MED ORDER — LORAZEPAM 1 MG PO TABS
1.0000 mg | ORAL_TABLET | Freq: Four times a day (QID) | ORAL | Status: DC | PRN
  Administered 2014-01-02 – 2014-01-03 (×4): 1 mg via ORAL
  Filled 2014-01-02 (×5): qty 1

## 2014-01-02 MED ORDER — ALPRAZOLAM 0.5 MG PO TABS
0.5000 mg | ORAL_TABLET | Freq: Three times a day (TID) | ORAL | Status: DC | PRN
  Administered 2014-01-02: 0.5 mg via ORAL
  Filled 2014-01-02: qty 1

## 2014-01-02 NOTE — Progress Notes (Signed)
Patient ID: SHIVAAY STORMONT, male   DOB: [birth year], 38 y.o.   MRN: [id number]   Patient Name: Matthew [name] Date of Encounter: 01/02/2014     Active Problems:   HYPERLIPIDEMIA-MIXED   HYPERTENSION, BENIGN   Complete transposition of great vessels   Nausea vomiting and diarrhea   Atrial flutter   Hypokalemia   Hypomagnesemia   Alcohol abuse   Alcohol withdrawal   Dehydration with hyponatremia   Depression   Hyperglycemia   Melena   Elevated transaminase level   Metabolic acidosis   Protein-calorie malnutrition, severe   Pulmonary hypertension   Transposition of great vessels   Chronic systolic CHF (congestive heart failure)    SUBJECTIVE "I slept most of the night". Denies chest pain os sob. One episode where he was anxious.  CURRENT MEDS . busPIRone  15 mg Oral BID  . Chlorhexidine Gluconate Cloth  6 each Topical Q0600  . diltiazem  180 mg Oral Q12H  . FLUoxetine  20 mg Oral Daily  . folic acid  1 mg Oral Daily  . LORazepam  0-4 mg Intravenous Q12H  . multivitamin with minerals  1 tablet Oral Daily  . mupirocin ointment  1 application Nasal BID  . pantoprazole  40 mg Oral BID  . thiamine  100 mg Oral Daily  . Warfarin - Pharmacist Dosing Inpatient   Does not apply q1800    OBJECTIVE  Filed Vitals:   01/01/14 2000 01/01/14 2115 01/01/14 2300 01/02/14 0300  BP: 97/66 111/79 113/82 108/84  Pulse: 29  124 128  Temp: 98.4 F (36.9 C)  98.3 F (36.8 C) 98.4 F (36.9 C)  TempSrc: Oral  Oral Oral  Resp: 16  18 22   Height:      Weight:    202 lb 9.6 oz (91.9 kg)  SpO2: 95%  94% 96%    Intake/Output Summary (Last 24 hours) at 01/02/14 0719 Last data filed at 01/02/14 0500  Gross per 24 hour  Intake 2885.5 ml  Output   2100 ml  Net  785.5 ml   Filed Weights   12/31/13 0500 01/01/14 0400 01/02/14 0300  Weight: 204 lb 12.9 oz (92.9 kg) 204 lb 5.9 oz (92.7 kg) 202 lb 9.6 oz (91.9 kg)    PHYSICAL EXAM  General: awake, a bit tremulous,  NAD. HEENT:  Normal  Neck: Supple without bruits, 7 cm JVD Lungs:  Resp regular and unlabored, CTA. Heart: IReg tachycardia, no s3, s4, or murmurs. Abdomen: Soft, non-tender, non-distended, BS + x 4.  Extremities: No clubbing, cyanosis or edema. DP/PT/Radials 2+ and equal bilaterally.  Accessory Clinical Findings  CBC  Recent Labs  01/01/14 0230 01/02/14 0255  WBC 8.1 6.5  HGB 14.6 13.4  HCT 42.1 38.9*  MCV 98.1 98.7  PLT 221 563   Basic Metabolic Panel  Recent Labs  12/30/13 1043  01/01/14 0230 01/02/14 0255  NA 130*  < > 133* 135*  K 4.7  < > 4.6 4.6  CL 94*  < > 90* 96  CO2 23  < > 28 26  GLUCOSE 91  < > 107* 110*  BUN 7  < > 3* 4*  CREATININE 0.71  < > 0.70 0.79  CALCIUM 8.7  < > 9.5 9.2  MG 1.6  --   --  1.5  < > = values in this interval not displayed. Liver Function Tests  Recent Labs  12/31/13 0444 01/02/14 0255  AST 119* 64*  ALT 137* 100*  ALKPHOS 63 57  BILITOT 0.6 0.3  PROT 5.5* 5.8*  ALBUMIN 3.1* 3.0*   No results found for this basename: LIPASE, AMYLASE,  in the last 72 hours Cardiac Enzymes  Recent Labs  12/30/13 1043  TROPONINI <0.30   BNP No components found with this basename: POCBNP,  D-Dimer No results found for this basename: DDIMER,  in the last 72 hours Hemoglobin A1C No results found for this basename: HGBA1C,  in the last 72 hours Fasting Lipid Panel  Recent Labs  01/02/14 0255  CHOL 148  HDL 46  LDLCALC 87  TRIG 75  CHOLHDL 3.2   Thyroid Function Tests No results found for this basename: TSH, T4TOTAL, FREET3, T3FREE, THYROIDAB,  in the last 72 hours  TELE Atrial flutter with a variable but mostly fast ventricular rate    Radiology/Studies  US Abdomen Complete  12/30/2013   CLINICAL DATA:  Elevated liver function tests.  EXAM: ULTRASOUND ABDOMEN COMPLETE  COMPARISON:  None.  FINDINGS: Gallbladder:  No gallstones or wall thickening visualized. No sonographic Murphy sign noted.  Common bile duct:  Diameter:  3.4 mm  Liver:  Echogenic with decreased through transmission of the sound beam. No liver mass or focal lesion. Normal hepatopetal flow in the portal vein.  IVC:  No abnormality visualized.  Pancreas:  Visualized portion unremarkable.  Spleen:  Size and appearance within normal limits.  Right Kidney:  Length: 12 cm. Echogenicity within normal limits. No mass or hydronephrosis visualized.  Left Kidney:  Length: 12.3 cm. Echogenicity within normal limits. No mass or hydronephrosis visualized.  Abdominal aorta:  No aneurysm visualized.  Other findings:  None.  IMPRESSION: 1. No acute findings. 2. Extensive hepatic steatosis.   Electronically Signed   By: Lajean Manes M.D.   On: 12/30/2013 19:41   Dg Chest Portable 1 View  12/29/2013   CLINICAL DATA:  Generalized weakness. Nausea, vomiting, diarrhea. History of transposition of the great vessels.  EXAM: PORTABLE CHEST - 1 VIEW  COMPARISON:  10/01/2012.  FINDINGS: Cardiopericardial silhouette is within normal for size. Median sternotomy. 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 There is no airspace disease. No pleural effusion. Monitoring leads project over the chest.  IMPRESSION: No active cardiopulmonary disease.   Electronically Signed   By: Dereck Ligas M.D.   On: 12/29/2013 19:02    ASSESSMENT AND PLAN 1. Adult congenital heart disease 2. Atypical atrial flutter 3. S/p remote heart surgery 4. ETOH abuse Rec: his ventricular rate is still not well controlled.  I would suggest we continue his IV heparin and cardizem, give prophylaxis for DT's. I have reviewed the case with my non-invasive cardiology partners. His congenital heart disease and prior surgery with baffles placed to redirect the blood flow makes the utility of TEE of questionable reliability in predicting/visualizing thrombus. Therefore he will need rate control for a month with therapeutic coumadin before undergoing DCCV. Ultimately Amiodarone  may be needed for maintenance of NSR. He can go home when his rate is controlled and he is therapeutic or nearly so on coumadin. I have warned him that coumadin and ETOH abuse do not mix and that he should stop all alcohol consumption.  Gregg Taylor,M.D.  01/02/2014 7:19 AM

## 2014-01-02 NOTE — Progress Notes (Signed)
[place] for heparin Indication: atrial fibrillation  Allergies  Allergen Reactions  . Aspirin     Patient Measurements: Height: 6\' 3"  (190.5 cm) Weight: 202 lb 9.6 oz (91.9 kg) IBW/kg (Calculated) : 84.5 Heparin Dosing Weight: 107 kg  Vital Signs: Temp: 98.4 F (36.9 C) (06/21 0300) Temp src: Oral (06/21 0300) BP: 108/84 mmHg (06/21 0300) Pulse Rate: 128 (06/21 0300)  Labs:  Recent Labs  12/30/13 1043  12/31/13 0444 12/31/13 0500  01/01/14 0230 01/01/14 1604 01/02/14 0255  HGB 14.5  --  13.7  --   --  14.6  --  13.4  HCT 40.3  --  38.6*  --   --  42.1  --  38.9*  PLT 166  --  170  --   --  221  --  213  LABPROT  --   --   --  13.7  --  12.3  --  15.6*  INR  --   --   --  1.07  --  0.93  --  1.27  HEPARINUNFRC  --   < >  --  0.91*  < > 0.73* 0.53 0.41  CREATININE 0.71  --  0.71  --   --  0.70  --  0.79  TROPONINI <0.30  --   --   --   --   --   --   --   < > = values in this interval not displayed.  Estimated Creatinine Clearance: 154 ml/min (by C-G formula based on Cr of 0.79).   Medical History: Past Medical History  Diagnosis Date  . Complete transposition of great vessels   . Hypertension   . Dyslipidemia   . Depression   . Alcohol abuse   . [place] spotted fever 11/2013    Medications:  Prescriptions prior to admission  Medication Sig Dispense Refill  . ALPRAZolam (XANAX) 0.5 MG tablet Take 0.5 mg by mouth 3 (three) times daily as needed for anxiety.       . busPIRone (BUSPAR) 15 MG tablet Take 15 mg by mouth 2 (two) times daily.      Marland Kitchen FLUoxetine (PROZAC) 20 MG capsule Take 20 mg by mouth daily.      Marland Kitchen loperamide (IMODIUM) 2 MG capsule Take 1 capsule (2 mg total) by mouth 4 (four) times daily as needed for diarrhea or loose stools.  12 capsule  0  . losartan (COZAAR) 50 MG tablet Take 50 mg by mouth daily.       Scheduled:  . busPIRone  15 mg Oral BID  . Chlorhexidine Gluconate Cloth  6 each Topical  Q0600  . diltiazem  180 mg Oral Q12H  . FLUoxetine  20 mg Oral Daily  . folic acid  1 mg Oral Daily  . multivitamin with minerals  1 tablet Oral Daily  . mupirocin ointment  1 application Nasal BID  . pantoprazole  40 mg Oral BID  . thiamine  100 mg Oral Daily  . Warfarin - Pharmacist Dosing Inpatient   Does not apply q1800   Infusions:  . 0.9 % NaCl with KCl 40 mEq / L 100 mL/hr (01/02/14 0553)  . heparin 1,250 Units/hr (01/01/14 1417)    Assessment: 38 yo who was admitted for afib and other issues. Pt to continue on heparin and warfarin for afib.  His heparin level remains therapeutic this morning.  INR is subtherapeutic, but increasing after two doses of 7.5mg .  No  signs of bleeding complications noted.  H/H and plts are stable.   Goal of Therapy:  Heparin level 0.3-0.7 units/ml Monitor platelets by anticoagulation protocol: Yes INR 2-3   Plan:  Continue Heparin drip at 1250 units/hr Warfarin 7.5mg  tonight Daily HL, INR, and CBC   Thanks for allowing pharmacy to be a part of this patient's care.  Vivia Ewing, PharmD Clinical Pharmacist - Resident Pager: [phone] Pharmacy: [phone] 01/02/2014 7:59 AM

## 2014-01-02 NOTE — Progress Notes (Signed)
[place] TEAM 1 - Stepdown/ICU TEAM Progress Note  Matthew [name] PNT:[id number] DOB: 1975-07-27 DOA: 12/29/2013 PCP: Glo Herring., MD  Admit HPI / Brief Narrative: 38 y.o. WM PMHx depression, alcohol abuse, transposition of great vessels, chronic systolic CHF, HTN, pulmonary hypertension, HLD. Presented with nausea and vomiting; generalized weakness. 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 He presented with a 2 to three-day history of loose stools numbering approximately 3 daily, nausea and vomiting with 5-6 episodes of emesis daily and generalized weakness. He denied abdominal pain, pain with urination, fever, or chills. He did have black tarry stools twice, but no bright red blood per rectum. He denied coffee grounds emesis. He denied chest pain or shortness of breath. He has had shakiness as he has been trying to decrease his alcohol intake. Over the past several months, he has been drinking heavily because of recent stressors in his family. His wife has left him with their 3 children and his father recently died unexpectedly of a massive stroke. He admittted to depression, but denied suicidal ideation. His primary care provider had recently increased the dose of Prozac from 20 mg to 40 mg daily. He believes that the increase may have initiated the gastrointestinal symptoms. He had recently been treated with doxycycline approximately 3 weeks ago for treatment of [facility] spotted fever. He has been drinking 6-12 beers daily with shots of vodka or he drinks a pint of vodka or other hard liquor daily. He has been attempting to cut down but finds it very difficult to do so. He denied alcohol withdrawal seizures or previous DTs. He denies any history of diabetes mellitus.  In the ED, the patient was initially mildly tachycardic, but developed a heart rate in the 200-250 range. Per the ED  physician, it appeared that his rhythm may have been PSVT. The initial EKG revealed questionable atrial fibrillation and other abnormalities with a heart rate of 228 beats per minute. He was given diltiazem IV and a total of 18 mg of IV adenosine. The followup EKG revealed sinus tachycardia with a heart rate of 123 beats per minute, prolonged QT interval, and nonspecific ST and T wave abnormalities. His chest x-ray reveals no acute cardiopulmonary disease. His lab data are significant for a serum sodium of 124, potassium of 2.5, glucose of 185, magnesium of 1.1, normal lipase, ALT of 188, AST is 175, point-of-care troponin I 0, and lactic acid 2.10. He is being admitted for further evaluation and management.   HPI/Subjective: 6/21 patient resting comfortably in chair states understands the new plan is for him to become therapeutic on anti-coagulation and have heart rate controlled prior to discharge.  Assessment/Plan: Complete transposition of great vessels  -Cards consulted given h/o acute atrial arrhythmia   Nausea vomiting and diarrhea/Melena  -Resolved  -6/18 MRSA by PCR positive  Atrial flutter  -EKG 6/18 revealed atrial flutter with 2:1 conduction pattern  -Chronic alcoholism also contributing factor  -Continue IV heparin and Coumadin  -Per cardiology patient will be anticoagulated for one month prior to DCCV -6/18 echocardiogram; chronic systolic CHF, pulmonary hypertension see results below  -6/21 continue Cardizem PO 180 mg BID, patient's HR still not fully controlled however BP is borderline may have to consider adding amiodarone  Chronic systolic CHF  -Per cardiology  -Control atrial flutter see above -HTN currently controlled   Pulmonary hypertension  -Once atrial flutter controlled if patient's BP will  tolerate will add Imdur    Dehydration with hyponatremia/Metabolic acidosis  -Lactic acid had increased on 6/18 after admission and was likely related to recent tachycardia  and inadequate perfusion related to same- today is down to 0.7  -6/20 resolved  Elevated transaminase level  -Likely from ongoing alcohol use but recent tachycardia and dehydration could explain his weight  -Abdominal ultrasound severe FLD but no cirrhosis  -6/21 continued to trend down    Hypokalemia/Hypomagnesemia/ Hyperglycemia  -Follow labs and replete as indicated   Alcohol abuse/Alcohol withdrawal  -Continue CIWA  -Once medically stable we'll ask psych social worker to evaluate the patient and provide outpatient program  -Patient apparently established with primary care physician who is managing medication for depression  -6/21 patient's CIWA protocol was inadvertently discontinued, which resulted in patients heart rate shooting into the high 120s and 130s, patient became diaphoretic. Restarted CIWA protocol patient's heart rate now between 80-100. DO NOT STOP CIWA PROTOCOL WITHOUT CONTACTING ADMITTING TEAM  HYPERLIPIDEMIA-MIXED  -lipid panel; within AHA guideline  HYPERTENSION, BENIGN  -Cozaar on hold 2/2 relative hypotension-BP remains soft   Depression  -6/20 restart Prozac 20 mg daily   -Continue BuSpar 15mg  BID   Code Status: FULL Family Communication: no family present at time of exam Disposition Plan: Therapeutic on Coumadin and heart rate controlled    Consultants: Dr. Crissie Sickles (cardiology)    Procedure/Significant Events: 6/17 PCXR;No active cardiopulmonary disease. 6/17 ultrasound abdomen complete; No acute findings, Extensive hepatic steatosis   Culture -6/18 MRSA by PCR positive  Antibiotics: NA  DVT prophylaxis: Heparin and Coumadin   Devices NA   LINES / TUBES:  6/17 20ga right antecubital    Continuous Infusions: . heparin 1,250 Units/hr (01/01/14 1417)    Objective: VITAL SIGNS: Temp: 98.2 F (36.8 C) (06/21 1201) Temp src: Oral (06/21 1201) BP: 92/61 mmHg (06/21 1201) Pulse Rate: 122 (06/21 0813) SPO2; 100% on room  air FIO2:   Intake/Output Summary (Last 24 hours) at 01/02/14 1248 Last data filed at 01/02/14 1100  Gross per 24 hour  Intake   2095 ml  Output    700 ml  Net   1395 ml     Exam: General: A./O. x4, NAD, No acute respiratory distress Lungs: Clear to auscultation bilaterally without wheezes or crackles Cardiovascular: Tachycardia arrhythmia, Regular rhythm without murmur gallop or rub normal S1 and S2 Abdomen: Nontender, nondistended, soft, bowel sounds positive, no rebound, no ascites, no appreciable mass Extremities: No significant cyanosis, clubbing, or edema bilateral lower extremities  Data Reviewed: Basic Metabolic Panel:  Recent Labs Lab 12/29/13 1839 12/29/13 1851 12/30/13 1043 12/31/13 0444 01/01/14 0230 01/02/14 0255  NA 127* 124* 130* 132* 133* 135*  K 3.0* 2.5* 4.7 3.8 4.6 4.6  CL 82* 87* 94* 93* 90* 96  CO2 23  --  23 26 28 26   GLUCOSE 186* 185* 91 99 107* 110*  BUN 10 8 7 6  3* 4*  CREATININE 0.90 0.90 0.71 0.71 0.70 0.79  CALCIUM 9.1  --  8.7 8.7 9.5 9.2  MG 1.1*  --  1.6  --   --  1.5   Liver Function Tests:  Recent Labs Lab 12/29/13 1839 12/30/13 1043 12/31/13 0444 01/02/14 0255  AST 175* 124* 119* 64*  ALT 188* 144* 137* 100*  ALKPHOS 70 58 63 57  BILITOT 1.4* 0.9 0.6 0.3  PROT 6.3 5.5* 5.5* 5.8*  ALBUMIN 3.4* 3.0* 3.1* 3.0*    Recent Labs Lab 12/29/13 2043  LIPASE 46  No results found for this basename: AMMONIA,  in the last 168 hours CBC:  Recent Labs Lab 12/29/13 1839 12/29/13 1851 12/30/13 1043 12/31/13 0444 01/01/14 0230 01/02/14 0255  WBC 12.0*  --  7.8 6.5 8.1 6.5  NEUTROABS 9.4*  --   --   --   --   --   HGB 17.4* 18.0* 14.5 13.7 14.6 13.4  HCT 47.1 53.0* 40.3 38.6* 42.1 38.9*  MCV 93.8  --  95.7 96.7 98.1 98.7  PLT 233  --  166 170 221 213   Cardiac Enzymes:  Recent Labs Lab 12/29/13 2122 12/30/13 1043  TROPONINI <0.30 <0.30   BNP (last 3 results) No results found for this basename: PROBNP,  in the last  8760 hours CBG:  Recent Labs Lab 12/30/13 1150 12/30/13 1633 12/30/13 2015 12/30/13 2353 12/31/13 0411  GLUCAP 91 94 166* 107* 99    Recent Results (from the past 240 hour(s))  MRSA PCR SCREENING     Status: Abnormal   Collection Time    12/30/13 12:44 AM      Result Value Ref Range Status   MRSA by PCR POSITIVE (*) NEGATIVE Final   Comment:            The GeneXpert MRSA Assay (FDA     approved for NASAL specimens     only), is one component of a     comprehensive MRSA colonization     surveillance program. It is not     intended to diagnose MRSA     infection nor to guide or     monitor treatment for     MRSA infections.     Studies:  Recent x-ray studies have been reviewed in detail by the Attending Physician  Scheduled Meds:  Scheduled Meds: . busPIRone  15 mg Oral BID  . Chlorhexidine Gluconate Cloth  6 each Topical Q0600  . diltiazem  180 mg Oral Q12H  . FLUoxetine  20 mg Oral Daily  . folic acid  1 mg Oral Daily  . multivitamin with minerals  1 tablet Oral Daily  . mupirocin ointment  1 application Nasal BID  . pantoprazole  40 mg Oral BID  . thiamine  100 mg Oral Daily  . warfarin  7.5 mg Oral ONCE-1800  . Warfarin - Pharmacist Dosing Inpatient   Does not apply q1800    Time spent on care of this patient: 40 mins   Allie Bossier , MD   [facility] Office  [phone] Pager [phone]  On-Call/Text Page:      [email or web address]      password [email or web address]  If 7PM-7AM, please contact night-coverage [email or web address] Password [email or web address] 01/02/2014, 12:48 PM   LOS: 4 days

## 2014-01-03 LAB — COMPREHENSIVE METABOLIC PANEL
ALT: 93 U/L — ABNORMAL HIGH (ref 0–53)
AST: 63 U/L — ABNORMAL HIGH (ref 0–37)
Albumin: 3.1 g/dL — ABNORMAL LOW (ref 3.5–5.2)
Alkaline Phosphatase: 60 U/L (ref 39–117)
BUN: 5 mg/dL — ABNORMAL LOW (ref 6–23)
CO2: 25 mEq/L (ref 19–32)
Calcium: 9.1 mg/dL (ref 8.4–10.5)
Chloride: 96 mEq/L (ref 96–112)
Creatinine, Ser: 0.83 mg/dL (ref 0.50–1.35)
GFR calc Af Amer: >90 mL/min (ref >90)
GFR calc non Af Amer: >90 mL/min (ref >90)
Glucose, Bld: 137 mg/dL — ABNORMAL HIGH (ref 70–99)
Potassium: 4 mEq/L (ref 3.7–5.3)
Sodium: 133 mEq/L — ABNORMAL LOW (ref 137–147)
Total Bilirubin: 0.2 mg/dL — ABNORMAL LOW (ref 0.3–1.2)
Total Protein: 5.9 g/dL — ABNORMAL LOW (ref 6.0–8.3)

## 2014-01-03 LAB — MAGNESIUM: Magnesium: 1.9 mg/dL (ref 1.5–2.5)

## 2014-01-03 LAB — CBC
HCT: 39.4 % (ref 39.0–52.0)
Hemoglobin: 13.5 g/dL (ref 13.0–17.0)
MCH: 33.8 pg (ref 26.0–34.0)
MCHC: 34.3 g/dL (ref 30.0–36.0)
MCV: 98.5 fL (ref 78.0–100.0)
Platelets: 238 10*3/uL (ref 150–400)
RBC: 4 MIL/uL — ABNORMAL LOW (ref 4.22–5.81)
RDW: 13.7 % (ref 11.5–15.5)
WBC: 6.7 10*3/uL (ref 4.0–10.5)

## 2014-01-03 LAB — PROTIME-INR
INR: 1.77 — ABNORMAL HIGH (ref 0.00–1.49)
Prothrombin Time: 20.1 seconds — ABNORMAL HIGH (ref 11.6–15.2)

## 2014-01-03 LAB — HEPARIN LEVEL (UNFRACTIONATED): Heparin Unfractionated: 0.36 IU/mL (ref 0.30–0.70)

## 2014-01-03 MED ORDER — LORAZEPAM 2 MG/ML IJ SOLN: 1.0000 mg | INTRAMUSCULAR | Status: DC | PRN

## 2014-01-03 MED ORDER — ENSURE COMPLETE PO LIQD
237.0000 mL | Freq: Two times a day (BID) | ORAL | Status: DC
  Administered 2014-01-03 – 2014-01-05 (×4): 237 mL via ORAL

## 2014-01-03 MED ORDER — METOPROLOL TARTRATE 25 MG PO TABS
25.0000 mg | ORAL_TABLET | Freq: Two times a day (BID) | ORAL | Status: DC
  Administered 2014-01-03 – 2014-01-04 (×3): 25 mg via ORAL
  Filled 2014-01-03 (×6): qty 1

## 2014-01-03 MED ORDER — LORAZEPAM 1 MG PO TABS
1.0000 mg | ORAL_TABLET | ORAL | Status: DC | PRN
  Administered 2014-01-03 – 2014-01-05 (×6): 1 mg via ORAL
  Filled 2014-01-03 (×6): qty 1

## 2014-01-03 MED ORDER — ENOXAPARIN SODIUM 100 MG/ML ~~LOC~~ SOLN
1.0000 mg/kg | Freq: Two times a day (BID) | SUBCUTANEOUS | Status: DC
  Administered 2014-01-03 (×2): 90 mg via SUBCUTANEOUS
  Filled 2014-01-03 (×5): qty 1

## 2014-01-03 MED ORDER — WARFARIN SODIUM 7.5 MG PO TABS
7.5000 mg | ORAL_TABLET | Freq: Once | ORAL | Status: AC
  Administered 2014-01-03: 7.5 mg via ORAL
  Filled 2014-01-03: qty 1

## 2014-01-03 NOTE — Progress Notes (Signed)
NUTRITION FOLLOW-UP  DOCUMENTATION CODES Per approved criteria  -Severe  malnutrition in the context of social or environmental circumstances   Pt meets criteria for severe MALNUTRITION in the context of social/environmental circumstances as evidenced by 23.7% weight loss x 5 months and energy intake </= 50% of estimated needs for >/= 1 month.   INTERVENTION: Add Ensure Complete po BID, each supplement provides 350 kcal and 13 grams of protein, chocolate. RD to continue to follow nutrition care plan.  NUTRITION DIAGNOSIS: Inadequate oral intake related to poor appetite as evidenced by poor PO intake x 6 months. Ongoing.  Goal: Pt to meet >/=90% of estimated nutrition needs. Improving.   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 He presents to the emergency department with a 2 to three-day history of loose stools numbering approximately 3 daily, nausea and vomiting with 5-6 episodes of emesis daily and generalized weakness.  Patient remains inpatient has he is waiting for INR to become therapeutic and awaiting for his heart rate to get under control.  Currently ordered for a Heart Healthy diet. Eating 50% of meals. Confirmed with RN that intake is fair. Pt reported on admission that he would like chocolate Ensure - RD to order at this time.  Sodium 133 - low Magnesium WNL Potassium WNL  Height: Ht Readings from Last 1 Encounters:  12/30/13 6\' 3"  (1.905 m)    Weight: Wt Readings from Last 1 Encounters:  01/03/14 199 lb 4.7 oz (90.4 kg)  Admit wt 200 lb  BMI:  Body mass index is 24.91 kg/(m^2). Normal   Estimated Nutritional Needs: Kcal: 2000 - 2200  Protein: 80 - 95 grams  Fluid: >/= 2 L/day   Skin: intact   Diet Order: Cardiac     Intake/Output Summary (Last 24  hours) at 01/03/14 1526 Last data filed at 01/03/14 0800  Gross per 24 hour  Intake  622.5 ml  Output      0 ml  Net  622.5 ml    Last BM: 6/21  Labs:   Recent Labs Lab 12/30/13 1043  01/01/14 0230 01/02/14 0255 01/03/14 0220  NA 130*  < > 133* 135* 133*  K 4.7  < > 4.6 4.6 4.0  CL 94*  < > 90* 96 96  CO2 23  < > 28 26 25   BUN 7  < > 3* 4* 5*  CREATININE 0.71  < > 0.70 0.79 0.83  CALCIUM 8.7  < > 9.5 9.2 9.1  MG 1.6  --   --  1.5 1.9  GLUCOSE 91  < > 107* 110* 137*  < > = values in this interval not displayed.  CBG (last 3)  No results found for this basename: GLUCAP,  in the last 72 hours  Scheduled Meds: . busPIRone  15 mg Oral BID  . Chlorhexidine Gluconate Cloth  6 each Topical Q0600  . diltiazem  180 mg Oral Q12H  . enoxaparin (LOVENOX) injection  1 mg/kg Subcutaneous Q12H  . FLUoxetine  20 mg Oral Daily  . folic acid  1 mg Oral Daily  . metoprolol tartrate  25 mg Oral BID  . multivitamin with minerals  1 tablet Oral Daily  . mupirocin ointment  1 application Nasal BID  . pantoprazole  40 mg Oral BID  . thiamine  100 mg Oral  Daily  . warfarin  7.5 mg Oral ONCE-1800  . Warfarin - Pharmacist Dosing Inpatient   Does not apply q1800    Continuous Infusions:    Inda Coke MS, RD, LDN Inpatient Registered Dietitian Pager: [phone] After-hours pager: [phone]

## 2014-01-03 NOTE — Progress Notes (Signed)
[place] for Coumadin, change heparin to lovenox Indication: atrial fibrillation  Allergies  Allergen Reactions  . Aspirin     Patient Measurements: Height: 6\' 3"  (190.5 cm) Weight: 199 lb 4.7 oz (90.4 kg) IBW/kg (Calculated) : 84.5 Heparin Dosing Weight: 107 kg  Vital Signs: Temp: 97.8 F (36.6 C) (06/22 1146) Temp src: Oral (06/22 1146) BP: 88/52 mmHg (06/22 1146) Pulse Rate: 88 (06/22 1146)  Labs:  Recent Labs  01/01/14 0230 01/01/14 1604 01/02/14 0255 01/03/14 0220  HGB 14.6  --  13.4 13.5  HCT 42.1  --  38.9* 39.4  PLT 221  --  213 238  LABPROT 12.3  --  15.6* 20.1*  INR 0.93  --  1.27 1.77*  HEPARINUNFRC 0.73* 0.53 0.41 0.36  CREATININE 0.70  --  0.79 0.83    Estimated Creatinine Clearance: 148.5 ml/min (by C-G formula based on Cr of 0.83).   Medical History: Past Medical History  Diagnosis Date  . Complete transposition of great vessels   . Hypertension   . Dyslipidemia   . Depression   . Alcohol abuse   . [place] spotted fever 11/2013    Medications:  Prescriptions prior to admission  Medication Sig Dispense Refill  . ALPRAZolam (XANAX) 0.5 MG tablet Take 0.5 mg by mouth 3 (three) times daily as needed for anxiety.       . busPIRone (BUSPAR) 15 MG tablet Take 15 mg by mouth 2 (two) times daily.      Marland Kitchen FLUoxetine (PROZAC) 20 MG capsule Take 20 mg by mouth daily.      Marland Kitchen loperamide (IMODIUM) 2 MG capsule Take 1 capsule (2 mg total) by mouth 4 (four) times daily as needed for diarrhea or loose stools.  12 capsule  0  . losartan (COZAAR) 50 MG tablet Take 50 mg by mouth daily.       Scheduled:  . busPIRone  15 mg Oral BID  . Chlorhexidine Gluconate Cloth  6 each Topical Q0600  . diltiazem  180 mg Oral Q12H  . enoxaparin (LOVENOX) injection  1 mg/kg Subcutaneous Q12H  . FLUoxetine  20 mg Oral Daily  . folic acid  1 mg Oral Daily  . metoprolol tartrate  25 mg Oral BID  . multivitamin with minerals  1  tablet Oral Daily  . mupirocin ointment  1 application Nasal BID  . pantoprazole  40 mg Oral BID  . thiamine  100 mg Oral Daily  . Warfarin - Pharmacist Dosing Inpatient   Does not apply q1800   Assessment: 38 yo who was admitted for afib and other issues. Pt to continue on heparin and warfarin for afib.  His heparin level remains therapeutic this morning.  INR is subtherapeutic, but increasing after three doses of 7.5mg .  No signs of bleeding complications noted.  H/H and plts are stable.  MD changing IV heparin to lovenox given patient desire to remove IV.  Estimated CrCl > 100 ml/min   Goal of Therapy:  Heparin level 0.3-0.7 units/ml Monitor platelets by anticoagulation protocol: Yes INR 2-3   Plan:  1. Change IV heparin to Lovenox 90 mg sq bid. 2. CBC q 72 hrs while on Lovenox 3. Coumadin 7.5 mg po x 1 tonight. 4. Continue daily PT/INR.  Uvaldo Rising, BCPS  Clinical Pharmacist Pager [phone]  01/03/2014 11:56 AM

## 2014-01-03 NOTE — Progress Notes (Signed)
Called to restart iv for heparin gtt.  Upon arrival to room, pt states "I just had one put in yesterday, and I don't want it."  Explained to pt. IV has medication.  "I want to wait and talk to my doctor."  Manual Meier RN IV Team

## 2014-01-03 NOTE — Progress Notes (Signed)
[place] TEAM 1 - Stepdown/ICU TEAM Progress Note  Matthew [name] ASN:[id number] DOB: Jul 11, 1976 DOA: 12/29/2013 PCP: Glo Herring., MD  Admit HPI / Brief Narrative: 39 y.o. M w/ Hx of depression, alcohol abuse, transposition of the great vessels, chronic systolic CHF, HTN, pulmonary hypertension, and HLD who presented with 2-3 days of nausea/vomiting/loose stools with generalized weakness. He denied abdominal pain, pain with urination, fever, or chills. He did have black tarry stools twice, but no bright red blood per rectum. He denied coffee grounds emesis. He denied chest pain or shortness of breath. Over the preceding several months, he had been drinking heavily because of recent stressors in his family. He had recently been treated with doxycycline for [facility] spotted fever.   In the ED, the patient was initially mildly tachycardic, but developed a heart rate in the 200-250 range. Per the ED physician, it appeared that his rhythm may have been PSVT. The initial EKG revealed questionable atrial fibrillation and other abnormalities with a heart rate of 228 beats per minute. He was given diltiazem IV and a total of 18 mg of IV adenosine. The followup EKG revealed sinus tachycardia with a heart rate of 123 beats per minute, prolonged QT interval, and nonspecific ST and T wave abnormalities. His chest x-ray revealed no acute cardiopulmonary disease. His lab data was significant for a serum sodium of 124, potassium of 2.5, glucose of 185, magnesium of 1.1, normal lipase, ALT of 188, AST is 175, point-of-care troponin I 0, and lactic acid 2.10.   HPI/Subjective: Pt is alert and conversant, though subtly confused and tremulous.    Assessment/Plan:  Complete transposition of great vessels  -Cards consulted given h/o acute atrial arrhythmia   Nausea vomiting and diarrhea / melena  -Resolved   Atrial flutter  -as per Cardiology   Chronic systolic CHF  -Per Cardiology    Pulmonary hypertension  -Once atrial flutter controlled if patient's BP will tolerate will add Imdur    Dehydration with hyponatremia/Metabolic acidosis  -Lactic acid had increased on 6/18 after admission and was likely related to recent tachycardia and inadequate perfusion related to same- today is down to 0.7  -6/20 resolved  Elevated transaminase level  -Likely from ongoing alcohol use but recent tachycardia and dehydration could explain -Abdominal ultrasound severe FLD but no cirrhosis  -continued to trend down    Hypokalemia/Hypomagnesemia/ Hyperglycemia  -Follow labs and replete as indicated   Alcohol abuse/Alcohol withdrawal  -Continue CIWA  -Once medically stable we'll ask psych social worker to evaluate the patient and provide outpatient program  -Patient apparently established with primary care physician who is managing medication for depression   HYPERLIPIDEMIA-MIXED  -lipid panel; within AHA guideline  HYPERTENSION, BENIGN  -Cozaar on hold 2/2 relative hypotension-BP remains soft   Depression  -6/20 restart Prozac 20 mg daily   -Continue BuSpar 15mg  BID  Code Status: FULL Family Communication: no family present at time of exam Disposition Plan: SDU  Consultants: Dr. Crissie Sickles (cardiology)  Procedure/Significant Events: 6/17 PCXR;No active cardiopulmonary disease. 6/17 ultrasound abdomen complete; No acute findings, Extensive hepatic steatosis  Antibiotics: NA  DVT prophylaxis: Lovenox and Coumadin   Objective: VITAL SIGNS: Blood pressure 88/52, pulse 88, temperature 97.8 F (36.6 C), temperature source Oral, resp. rate 18, height 6\' 3"  (1.905 m), weight 90.4 kg (199 lb 4.7 oz), SpO2 98.00%.  Intake/Output Summary (Last 24 hours) at 01/03/14 1153 Last data filed at 01/03/14 0800  Gross per 24 hour  Intake  985 ml  Output      0 ml  Net    985 ml   Exam: General: No acute respiratory distress Lungs: Clear to auscultation bilaterally  without wheezes or crackles Cardiovascular: Tachycardic without murmur gallop or rub normal S1 and S2 Abdomen: Nontender, nondistended, soft, bowel sounds positive, no rebound, no ascites, no appreciable mass Extremities: No significant cyanosis, clubbing, or edema bilateral lower extremities  Data Reviewed: Basic Metabolic Panel:  Recent Labs Lab 12/29/13 1839  12/30/13 1043 12/31/13 0444 01/01/14 0230 01/02/14 0255 01/03/14 0220  NA 127*  < > 130* 132* 133* 135* 133*  K 3.0*  < > 4.7 3.8 4.6 4.6 4.0  CL 82*  < > 94* 93* 90* 96 96  CO2 23  --  23 26 28 26 25   GLUCOSE 186*  < > 91 99 107* 110* 137*  BUN 10  < > 7 6 3* 4* 5*  CREATININE 0.90  < > 0.71 0.71 0.70 0.79 0.83  CALCIUM 9.1  --  8.7 8.7 9.5 9.2 9.1  MG 1.1*  --  1.6  --   --  1.5 1.9  < > = values in this interval not displayed.  Liver Function Tests:  Recent Labs Lab 12/29/13 1839 12/30/13 1043 12/31/13 0444 01/02/14 0255 01/03/14 0220  AST 175* 124* 119* 64* 63*  ALT 188* 144* 137* 100* 93*  ALKPHOS 70 58 63 57 60  BILITOT 1.4* 0.9 0.6 0.3 0.2*  PROT 6.3 5.5* 5.5* 5.8* 5.9*  ALBUMIN 3.4* 3.0* 3.1* 3.0* 3.1*    Recent Labs Lab 12/29/13 2043  LIPASE 46   CBC:  Recent Labs Lab 12/29/13 1839  12/30/13 1043 12/31/13 0444 01/01/14 0230 01/02/14 0255 01/03/14 0220  WBC 12.0*  --  7.8 6.5 8.1 6.5 6.7  NEUTROABS 9.4*  --   --   --   --   --   --   HGB 17.4*  < > 14.5 13.7 14.6 13.4 13.5  HCT 47.1  < > 40.3 38.6* 42.1 38.9* 39.4  MCV 93.8  --  95.7 96.7 98.1 98.7 98.5  PLT 233  --  166 170 221 213 238  < > = values in this interval not displayed.  Cardiac Enzymes:  Recent Labs Lab 12/29/13 2122 12/30/13 1043  TROPONINI <0.30 <0.30   CBG:  Recent Labs Lab 12/30/13 1150 12/30/13 1633 12/30/13 2015 12/30/13 2353 12/31/13 0411  GLUCAP 91 94 166* 107* 99    Recent Results (from the past 240 hour(s))  MRSA PCR SCREENING     Status: Abnormal   Collection Time    12/30/13 12:44 AM       Result Value Ref Range Status   MRSA by PCR POSITIVE (*) NEGATIVE Final   Comment:            The GeneXpert MRSA Assay (FDA     approved for NASAL specimens     only), is one component of a     comprehensive MRSA colonization     surveillance program. It is not     intended to diagnose MRSA     infection nor to guide or     monitor treatment for     MRSA infections.     Studies:  Recent x-ray studies have been reviewed in detail by the Attending Physician  Scheduled Meds:  Scheduled Meds: . busPIRone  15 mg Oral BID  . Chlorhexidine Gluconate Cloth  6 each Topical Q0600  . diltiazem  180 mg Oral Q12H  . enoxaparin (LOVENOX) injection  1 mg/kg Subcutaneous Q12H  . FLUoxetine  20 mg Oral Daily  . folic acid  1 mg Oral Daily  . metoprolol tartrate  25 mg Oral BID  . multivitamin with minerals  1 tablet Oral Daily  . mupirocin ointment  1 application Nasal BID  . pantoprazole  40 mg Oral BID  . thiamine  100 mg Oral Daily  . Warfarin - Pharmacist Dosing Inpatient   Does not apply q1800    Time spent on care of this patient: 35 mins  Cherene Altes, MD [facility] For Consults/Admissions - Flow Manager - [phone] Office  [phone] Pager [phone]  On-Call/Text Page:      [email or web address]      password [facility]  01/03/2014, 11:53 AM   LOS: 5 days

## 2014-01-03 NOTE — Progress Notes (Signed)
Pt intentionally pulled out his iv catheter whiles heparin was infusing. Pt did not want me to restart his iv so iv team was called to restart but patient insisted on having iv out. Extensive education provided on he risks of thrombus formation and subsequent cerebrovascular and/or  pulmonary injury. Pt understands the risk an still does not to be anticoagulated  by IV medicine.    Pt also refuses to take artivan which was due for him base on his CIWa score of 10. Will continue to monitor

## 2014-01-03 NOTE — Progress Notes (Signed)
Patient ID: LARENZ FRASIER, male   DOB: [birth year], 38 y.o.   MRN: [id number]   Patient Name: Matthew [name] Date of Encounter: 01/03/2014     Active Problems:   HYPERLIPIDEMIA-MIXED   HYPERTENSION, BENIGN   Complete transposition of great vessels   Nausea vomiting and diarrhea   Atrial flutter   Hypokalemia   Hypomagnesemia   Alcohol abuse   Alcohol withdrawal   Dehydration with hyponatremia   Depression   Hyperglycemia   Melena   Elevated transaminase level   Metabolic acidosis   Protein-calorie malnutrition, severe   Pulmonary hypertension   Transposition of great vessels   Chronic systolic CHF (congestive heart failure)    SUBJECTIVE "Trilby Drummer was up to his tricks last night"  Denies chest pain or sob. He was anxious and agitated most of the night. Took IV out.  CURRENT MEDS . busPIRone  15 mg Oral BID  . Chlorhexidine Gluconate Cloth  6 each Topical Q0600  . diltiazem  180 mg Oral Q12H  . FLUoxetine  20 mg Oral Daily  . folic acid  1 mg Oral Daily  . metoprolol tartrate  25 mg Oral BID  . multivitamin with minerals  1 tablet Oral Daily  . mupirocin ointment  1 application Nasal BID  . pantoprazole  40 mg Oral BID  . thiamine  100 mg Oral Daily  . Warfarin - Pharmacist Dosing Inpatient   Does not apply q1800    OBJECTIVE  Filed Vitals:   01/03/14 0332 01/03/14 0344 01/03/14 0500 01/03/14 0600  BP:  130/86    Pulse:    126  Temp: 98.5 F (36.9 C)     TempSrc:      Resp:      Height:      Weight:   199 lb 4.7 oz (90.4 kg)   SpO2:  95%      Intake/Output Summary (Last 24 hours) at 01/03/14 0744 Last data filed at 01/03/14 0600  Gross per 24 hour  Intake   1075 ml  Output      0 ml  Net   1075 ml   Filed Weights   01/01/14 0400 01/02/14 0300 01/03/14 0500  Weight: 204 lb 5.9 oz (92.7 kg) 202 lb 9.6 oz (91.9 kg) 199 lb 4.7 oz (90.4 kg)    PHYSICAL EXAM  General: awake, a bit tremulous, NAD. HEENT:  Normal  Neck: Supple without bruits,  7 cm JVD Lungs:  Resp regular and unlabored, CTA. Heart: IReg tachycardia, no s3, s4, or murmurs. Abdomen: Soft, non-tender, non-distended, BS + x 4.  Extremities: No clubbing, cyanosis or edema. DP/PT/Radials 2+ and equal bilaterally.  Accessory Clinical Findings  CBC  Recent Labs  01/02/14 0255 01/03/14 0220  WBC 6.5 6.7  HGB 13.4 13.5  HCT 38.9* 39.4  MCV 98.7 98.5  PLT 213 854   Basic Metabolic Panel  Recent Labs  01/02/14 0255 01/03/14 0220  NA 135* 133*  K 4.6 4.0  CL 96 96  CO2 26 25  GLUCOSE 110* 137*  BUN 4* 5*  CREATININE 0.79 0.83  CALCIUM 9.2 9.1  MG 1.5 1.9   Liver Function Tests  Recent Labs  01/02/14 0255 01/03/14 0220  AST 64* 63*  ALT 100* 93*  ALKPHOS 57 60  BILITOT 0.3 0.2*  PROT 5.8* 5.9*  ALBUMIN 3.0* 3.1*   No results found for this basename: LIPASE, AMYLASE,  in the last 72 hours Cardiac Enzymes No results found for this basename:  CKTOTAL, CKMB, CKMBINDEX, TROPONINI,  in the last 72 hours BNP No components found with this basename: POCBNP,  D-Dimer No results found for this basename: DDIMER,  in the last 72 hours Hemoglobin A1C No results found for this basename: HGBA1C,  in the last 72 hours Fasting Lipid Panel  Recent Labs  01/02/14 0255  CHOL 148  HDL 46  LDLCALC 87  TRIG 75  CHOLHDL 3.2   Thyroid Function Tests No results found for this basename: TSH, T4TOTAL, FREET3, T3FREE, THYROIDAB,  in the last 72 hours  TELE Atrial flutter with a variable but mostly fast ventricular rate    Radiology/Studies  US Abdomen Complete  12/30/2013   CLINICAL DATA:  Elevated liver function tests.  EXAM: ULTRASOUND ABDOMEN COMPLETE  COMPARISON:  None.  FINDINGS: Gallbladder:  No gallstones or wall thickening visualized. No sonographic Murphy sign noted.  Common bile duct:  Diameter: 3.4 mm  Liver:  Echogenic with decreased through transmission of the sound beam. No liver mass or focal lesion. Normal hepatopetal flow in the portal  vein.  IVC:  No abnormality visualized.  Pancreas:  Visualized portion unremarkable.  Spleen:  Size and appearance within normal limits.  Right Kidney:  Length: 12 cm. Echogenicity within normal limits. No mass or hydronephrosis visualized.  Left Kidney:  Length: 12.3 cm. Echogenicity within normal limits. No mass or hydronephrosis visualized.  Abdominal aorta:  No aneurysm visualized.  Other findings:  None.  IMPRESSION: 1. No acute findings. 2. Extensive hepatic steatosis.   Electronically Signed   By: Lajean Manes M.D.   On: 12/30/2013 19:41   Dg Chest Portable 1 View  12/29/2013   CLINICAL DATA:  Generalized weakness. Nausea, vomiting, diarrhea. History of transposition of the great vessels.  EXAM: PORTABLE CHEST - 1 VIEW  COMPARISON:  10/01/2012.  FINDINGS: Cardiopericardial silhouette is within normal for size. Median sternotomy. 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 There is no airspace disease. No pleural effusion. Monitoring leads project over the chest.  IMPRESSION: No active cardiopulmonary disease.   Electronically Signed   By: Dereck Ligas M.D.   On: 12/29/2013 19:02    ASSESSMENT AND PLAN 1. Adult congenital heart disease 2. Atypical atrial flutter, difficult to control 3. S/p remote heart surgery 4. ETOH abuse Rec: his ventricular rate is still not well controlled.  I would suggest we add metoprolol today and continue prophylaxis for DT's. Ultimately Amiodarone may be needed for maintenance of NSR. He can go home when his rate is controlled and he is therapeutic or nearly so on coumadin. I have warned him that coumadin and ETOH abuse do not mix and that he should stop all alcohol consumption.   Gregg Taylor,M.D.  01/03/2014 7:44 AM

## 2014-01-04 ENCOUNTER — Other Ambulatory Visit: Payer: Self-pay | Admitting: Cardiology

## 2014-01-04 DIAGNOSIS — F10931 Alcohol use, unspecified with withdrawal delirium: Secondary | ICD-10-CM

## 2014-01-04 DIAGNOSIS — F332 Major depressive disorder, recurrent severe without psychotic features: Secondary | ICD-10-CM

## 2014-01-04 DIAGNOSIS — R443 Hallucinations, unspecified: Secondary | ICD-10-CM

## 2014-01-04 DIAGNOSIS — I484 Atypical atrial flutter: Secondary | ICD-10-CM

## 2014-01-04 DIAGNOSIS — F10231 Alcohol dependence with withdrawal delirium: Secondary | ICD-10-CM

## 2014-01-04 LAB — CBC
HCT: 40.5 % (ref 39.0–52.0)
Hemoglobin: 14 g/dL (ref 13.0–17.0)
MCH: 34 pg (ref 26.0–34.0)
MCHC: 34.6 g/dL (ref 30.0–36.0)
MCV: 98.3 fL (ref 78.0–100.0)
Platelets: 274 10*3/uL (ref 150–400)
RBC: 4.12 MIL/uL — ABNORMAL LOW (ref 4.22–5.81)
RDW: 13.9 % (ref 11.5–15.5)
WBC: 7.9 10*3/uL (ref 4.0–10.5)

## 2014-01-04 LAB — PROTIME-INR
INR: 2.16 — ABNORMAL HIGH (ref 0.00–1.49)
Prothrombin Time: 23.4 seconds — ABNORMAL HIGH (ref 11.6–15.2)

## 2014-01-04 LAB — COMPREHENSIVE METABOLIC PANEL
ALT: 93 U/L — ABNORMAL HIGH (ref 0–53)
AST: 68 U/L — ABNORMAL HIGH (ref 0–37)
Albumin: 3.2 g/dL — ABNORMAL LOW (ref 3.5–5.2)
Alkaline Phosphatase: 59 U/L (ref 39–117)
BUN: 5 mg/dL — ABNORMAL LOW (ref 6–23)
CO2: 22 mEq/L (ref 19–32)
Calcium: 9.6 mg/dL (ref 8.4–10.5)
Chloride: 98 mEq/L (ref 96–112)
Creatinine, Ser: 0.74 mg/dL (ref 0.50–1.35)
GFR calc Af Amer: >90 mL/min (ref >90)
GFR calc non Af Amer: >90 mL/min (ref >90)
Glucose, Bld: 101 mg/dL — ABNORMAL HIGH (ref 70–99)
Potassium: 4.3 mEq/L (ref 3.7–5.3)
Sodium: 137 mEq/L (ref 137–147)
Total Bilirubin: 0.2 mg/dL — ABNORMAL LOW (ref 0.3–1.2)
Total Protein: 6.4 g/dL (ref 6.0–8.3)

## 2014-01-04 LAB — RETICULOCYTES
RBC.: 4.12 MIL/uL — ABNORMAL LOW (ref 4.22–5.81)
Retic Count, Absolute: 144.2 10*3/uL (ref 19.0–186.0)
Retic Ct Pct: 3.5 % — ABNORMAL HIGH (ref 0.4–3.1)

## 2014-01-04 LAB — AMMONIA: Ammonia: 34 umol/L (ref 11–60)

## 2014-01-04 LAB — IRON AND TIBC
Iron: 71 ug/dL (ref 42–135)
Saturation Ratios: 25 % (ref 20–55)
TIBC: 280 ug/dL (ref 215–435)
UIBC: 209 ug/dL (ref 125–400)

## 2014-01-04 LAB — MAGNESIUM: Magnesium: 1.8 mg/dL (ref 1.5–2.5)

## 2014-01-04 LAB — VITAMIN B12: Vitamin B-12: 437 pg/mL (ref 211–911)

## 2014-01-04 LAB — FOLATE: Folate: 9.3 ng/mL

## 2014-01-04 LAB — HEPARIN LEVEL (UNFRACTIONATED): Heparin Unfractionated: 0.82 IU/mL — ABNORMAL HIGH (ref 0.30–0.70)

## 2014-01-04 LAB — FERRITIN: Ferritin: 1706 ng/mL — ABNORMAL HIGH (ref 22–322)

## 2014-01-04 MED ORDER — WARFARIN SODIUM 7.5 MG PO TABS
7.5000 mg | ORAL_TABLET | Freq: Once | ORAL | Status: AC
  Administered 2014-01-04: 7.5 mg via ORAL
  Filled 2014-01-04: qty 1

## 2014-01-04 MED ORDER — QUETIAPINE FUMARATE ER 50 MG PO TB24
50.0000 mg | ORAL_TABLET | Freq: Every day | ORAL | Status: DC
  Administered 2014-01-04: 50 mg via ORAL
  Filled 2014-01-04 (×2): qty 1

## 2014-01-04 NOTE — Progress Notes (Addendum)
Patient ID: Matthew [name], male   DOB: 01/04/1976, 38 y.o.   MRN: [id number]   Patient Name: Matthew [name] Date of Encounter: 01/04/2014     Active Problems:   HYPERLIPIDEMIA-MIXED   HYPERTENSION, BENIGN   Complete transposition of great vessels   Nausea vomiting and diarrhea   Atrial flutter   Hypokalemia   Hypomagnesemia   Alcohol abuse   Alcohol withdrawal   Dehydration with hyponatremia   Depression   Hyperglycemia   Melena   Elevated transaminase level   Metabolic acidosis   Protein-calorie malnutrition, severe   Pulmonary hypertension   Transposition of great vessels   Chronic systolic CHF (congestive heart failure)    SUBJECTIVE  Sleeping. On awakening he would like to go home with rate control and coumadin and followup at [facility] for consideration of catheter ablation. Mother who is with him states that he has had hallucinations in the p.m.  CURRENT MEDS . busPIRone  15 mg Oral BID  . Chlorhexidine Gluconate Cloth  6 each Topical Q0600  . diltiazem  180 mg Oral Q12H  . enoxaparin (LOVENOX) injection  1 mg/kg Subcutaneous Q12H  . feeding supplement (ENSURE COMPLETE)  237 mL Oral BID BM  . FLUoxetine  20 mg Oral Daily  . folic acid  1 mg Oral Daily  . metoprolol tartrate  25 mg Oral BID  . multivitamin with minerals  1 tablet Oral Daily  . mupirocin ointment  1 application Nasal BID  . pantoprazole  40 mg Oral BID  . thiamine  100 mg Oral Daily  . Warfarin - Pharmacist Dosing Inpatient   Does not apply q1800    OBJECTIVE  Filed Vitals:   01/03/14 2021 01/04/14 0046 01/04/14 0438 01/04/14 0442  BP: 100/77 113/71 106/82   Pulse:      Temp: 98 F (36.7 C) 98.9 F (37.2 C)  97.7 F (36.5 C)  TempSrc: Oral Oral  Oral  Resp:      Height:      Weight:    195 lb 12.3 oz (88.8 kg)  SpO2:  99%      Intake/Output Summary (Last 24 hours) at 01/04/14 0747 Last data filed at 01/03/14 1600  Gross per 24 hour  Intake   1080 ml  Output      0 ml  Net    1080 ml   Filed Weights   01/02/14 0300 01/03/14 0500 01/04/14 0442  Weight: 202 lb 9.6 oz (91.9 kg) 199 lb 4.7 oz (90.4 kg) 195 lb 12.3 oz (88.8 kg)    PHYSICAL EXAM  General: awake, a bit tremulous, NAD. HEENT:  Normal  Neck: Supple without bruits, 7 cm JVD Lungs:  Resp regular and unlabored, CTA. Heart: IReg tachycardia, no s3, s4, or murmurs. Abdomen: Soft, non-tender, non-distended, BS + x 4.  Extremities: No clubbing, cyanosis or edema. DP/PT/Radials 2+ and equal bilaterally.  Accessory Clinical Findings  CBC  Recent Labs  01/03/14 0220 01/04/14 0305  WBC 6.7 7.9  HGB 13.5 14.0  HCT 39.4 40.5  MCV 98.5 98.3  PLT 238 852   Basic Metabolic Panel  Recent Labs  01/03/14 0220 01/04/14 0305  NA 133* 137  K 4.0 4.3  CL 96 98  CO2 25 22  GLUCOSE 137* 101*  BUN 5* 5*  CREATININE 0.83 0.74  CALCIUM 9.1 9.6  MG 1.9 1.8   Liver Function Tests  Recent Labs  01/03/14 0220 01/04/14 0305  AST 63* 68*  ALT 93* 93*  ALKPHOS 60 59  BILITOT 0.2* 0.2*  PROT 5.9* 6.4  ALBUMIN 3.1* 3.2*   No results found for this basename: LIPASE, AMYLASE,  in the last 72 hours Cardiac Enzymes No results found for this basename: CKTOTAL, CKMB, CKMBINDEX, TROPONINI,  in the last 72 hours BNP No components found with this basename: POCBNP,  D-Dimer No results found for this basename: DDIMER,  in the last 72 hours Hemoglobin A1C No results found for this basename: HGBA1C,  in the last 72 hours Fasting Lipid Panel  Recent Labs  01/02/14 0255  CHOL 148  HDL 46  LDLCALC 87  TRIG 75  CHOLHDL 3.2   Thyroid Function Tests No results found for this basename: TSH, T4TOTAL, FREET3, T3FREE, THYROIDAB,  in the last 72 hours  TELE Atrial flutter with a variable but mostly controlled ventricular rate    Radiology/Studies  US Abdomen Complete  12/30/2013   CLINICAL DATA:  Elevated liver function tests.  EXAM: ULTRASOUND ABDOMEN COMPLETE  COMPARISON:  None.  FINDINGS:  Gallbladder:  No gallstones or wall thickening visualized. No sonographic Murphy sign noted.  Common bile duct:  Diameter: 3.4 mm  Liver:  Echogenic with decreased through transmission of the sound beam. No liver mass or focal lesion. Normal hepatopetal flow in the portal vein.  IVC:  No abnormality visualized.  Pancreas:  Visualized portion unremarkable.  Spleen:  Size and appearance within normal limits.  Right Kidney:  Length: 12 cm. Echogenicity within normal limits. No mass or hydronephrosis visualized.  Left Kidney:  Length: 12.3 cm. Echogenicity within normal limits. No mass or hydronephrosis visualized.  Abdominal aorta:  No aneurysm visualized.  Other findings:  None.  IMPRESSION: 1. No acute findings. 2. Extensive hepatic steatosis.   Electronically Signed   By: Lajean Manes M.D.   On: 12/30/2013 19:41   Dg Chest Portable 1 View  12/29/2013   CLINICAL DATA:  Generalized weakness. Nausea, vomiting, diarrhea. History of transposition of the great vessels.  EXAM: PORTABLE CHEST - 1 VIEW  COMPARISON:  10/01/2012.  FINDINGS: Cardiopericardial silhouette is within normal for size. Median sternotomy. 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 There is no airspace disease. No pleural effusion. Monitoring leads project over the chest.  IMPRESSION: No active cardiopulmonary disease.   Electronically Signed   By: Dereck Ligas M.D.   On: 12/29/2013 19:02    ASSESSMENT AND PLAN 1. Adult congenital heart disease 2. Atypical atrial flutter, difficult to control 3. S/p remote heart surgery 4. ETOH abuse 5. Hallucinations Rec: his ventricular rate is much better with the initiation of metoprolol on top of cardizem. I offered him two options. One to go to [facility] for TEE guided ablation. The second would be continued rate control and coumadin with referral to [facility] for catheter ablation as an outpatient. Ultimately Amiodarone may be needed for maintenance of NSR if  ablation is not successful. He can go home today as his rate is controlled and he is therapeutic or nearly so on coumadin. I have warned him that coumadin and ETOH abuse do not mix and that he should stop all alcohol consumption. He will need oral benzodiazapines (ativan 1 mg tid as needed). With his hallucinations, will ask our hospitalist service to see him and make recommendations regarding hallucinations during ETOH withdrawal. He will need to return on Friday for PT/INR if he has been discharged.  Gregg Taylor,M.D.  01/04/2014 7:47 AM

## 2014-01-04 NOTE — Consult Note (Signed)
[facility] Face-to-Face Psychiatry Consult   Reason for Consult:  Alcoholic psychosis and depression Referring Physician:  Dr. Shara [name] is an 38 y.o. male. Total Time spent with patient: 45 minutes  Assessment: AXIS I:  Alcohol Abuse and Major Depression, Recurrent severe AXIS II:  Deferred AXIS III:   Past Medical History  Diagnosis Date  . Complete transposition of great vessels   . Hypertension   . Dyslipidemia   . Depression   . Alcohol abuse   . [place] spotted fever 11/2013   AXIS IV:  other psychosocial or environmental problems, problems related to social environment and problems with primary support group AXIS V:  41-50 serious symptoms  Plan: Case discussed with Dr. Sherral [name] and staff RN No evidence of imminent risk to self or others at present.   Patient does not meet criteria for psychiatric inpatient admission. Supportive therapy provided about ongoing stressors. Recommend SeroQuel XR 50 mg PO Q8PM for psychosis Appreciate psychiatric consultation Please contact [phone] if needs further assistance  Subjective:   Matthew [name] is a 38 y.o. male patient admitted with alcohol abuse, depression and psychosis.  HPI:  Patient is seen and chart reviewed. Patient admitted to [facility] with stomach upset and generalized weakness. Patient has received Ativan protocol for alcohol detox treatment. Psychiatry consultation requested for medication management of alcohol psychosis. Patient stated that he has been seeing objects in his room has been moving around especially in the night which making him more nervous and anxious. He has disturbed sleep. Reportedly he has been drinking for a long time and has increased since his wife left him about two months ago. He has motivated to stop drinking because of his other medical conditions. He has lost his father about the same time his wife left him. Patient and his exwife has joint custody of their twin  children (78) and 63 years old child. Patient reportedly working in [employer]. He has elevated LFT's on arrival.   Medical History: 38 y.o. WM PMHx depression, alcohol abuse, transposition of great vessels, chronic systolic CHF, HTN, pulmonary hypertension, HLD. Presented with nausea and vomiting; generalized weakness. 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 He presented with a 2 to three-day history of loose stools numbering approximately 3 daily, nausea and vomiting with 5-6 episodes of emesis daily and generalized weakness. He denied abdominal pain, pain with urination, fever, or chills. He did have black tarry stools twice, but no bright red blood per rectum. He denied coffee grounds emesis. He denied chest pain or shortness of breath. He has had shakiness as he has been trying to decrease his alcohol intake. Over the past several months, he has been drinking heavily because of recent stressors in his family. His wife has left him with their 3 children and his father recently died unexpectedly of a massive stroke. He admittted to depression, but denied suicidal ideation. His primary care provider had recently increased the dose of Prozac from 20 mg to 40 mg daily. He believes that the increase may have initiated the gastrointestinal symptoms. He had recently been treated with doxycycline approximately 3 weeks ago for treatment of [facility] spotted fever. He has been drinking 6-12 beers daily with shots of vodka or he drinks a pint of vodka or other hard liquor daily. He has been attempting to cut down but finds it very difficult to do so.  He denied alcohol withdrawal seizures or previous DTs. He denies any history of diabetes mellitus.   In the ED, the patient was initially mildly tachycardic, but developed a heart rate in the 200-250 range. Per the ED physician, it appeared that his rhythm may  have been PSVT. The initial EKG revealed questionable atrial fibrillation and other abnormalities with a heart rate of 228 beats per minute. He was given diltiazem IV and a total of 18 mg of IV adenosine. The followup EKG revealed sinus tachycardia with a heart rate of 123 beats per minute, prolonged QT interval, and nonspecific ST and T wave abnormalities. His chest x-ray reveals no acute cardiopulmonary disease. His lab data are significant for a serum sodium of 124, potassium of 2.5, glucose of 185, magnesium of 1.1, normal lipase, ALT of 188, AST is 175, point-of-care troponin I 0, and lactic acid 2.10. He is being admitted for further evaluation and management.  HPI Elements:   Location:  alcohol withdrawal, depression and psychosis. Quality:  fair. Severity:  acute. Timing:  separation and sudden quit drinking.  Past Psychiatric History: Past Medical History  Diagnosis Date  . Complete transposition of great vessels   . Hypertension   . Dyslipidemia   . Depression   . Alcohol abuse   . [facility] spotted fever 11/2013    reports that he has never smoked. He does not have any smokeless tobacco history on file. He reports that he drinks alcohol. He reports that he does not use illicit drugs. Family History  Problem Relation Age of Onset  . Rheumatic fever Father   . Hypertension Father      Living Arrangements: Children   Abuse/Neglect [facility]) Physical Abuse: Denies Verbal Abuse: Denies Sexual Abuse: Denies Allergies:   Allergies  Allergen Reactions  . Aspirin     ACT Assessment Complete:  NO Objective: Blood pressure 98/81, pulse 74, temperature 98.4 F (36.9 C), temperature source Oral, resp. rate 18, height 6' 3"  (1.905 m), weight 88.8 kg (195 lb 12.3 oz), SpO2 98.00%.Body mass index is 24.47 kg/(m^2). Results for orders placed during the hospital encounter of 12/29/13 (from the past 72 hour(s))  HEPARIN LEVEL (UNFRACTIONATED)     Status: None   Collection Time     01/01/14  4:04 PM      Result Value Ref Range   Heparin Unfractionated 0.53  0.30 - 0.70 IU/mL   Comment:            IF HEPARIN RESULTS ARE BELOW     EXPECTED VALUES, AND PATIENT     DOSAGE HAS BEEN CONFIRMED,     SUGGEST FOLLOW UP TESTING     OF ANTITHROMBIN III LEVELS.  HEPARIN LEVEL (UNFRACTIONATED)     Status: None   Collection Time    01/02/14  2:55 AM      Result Value Ref Range   Heparin Unfractionated 0.41  0.30 - 0.70 IU/mL   Comment:            IF HEPARIN RESULTS ARE BELOW     EXPECTED VALUES, AND PATIENT     DOSAGE HAS BEEN CONFIRMED,     SUGGEST FOLLOW UP TESTING     OF ANTITHROMBIN III LEVELS.  CBC     Status: Abnormal   Collection Time    01/02/14  2:55 AM      Result Value Ref Range   WBC 6.5  4.0 - 10.5 K/uL   RBC 3.94 (*) 4.22 - 5.81 MIL/uL  Hemoglobin 13.4  13.0 - 17.0 g/dL   HCT 38.9 (*) 39.0 - 52.0 %   MCV 98.7  78.0 - 100.0 fL   MCH 34.0  26.0 - 34.0 pg   MCHC 34.4  30.0 - 36.0 g/dL   RDW 13.4  11.5 - 15.5 %   Platelets 213  150 - 400 K/uL  PROTIME-INR     Status: Abnormal   Collection Time    01/02/14  2:55 AM      Result Value Ref Range   Prothrombin Time 15.6 (*) 11.6 - 15.2 seconds   INR 1.27  0.00 - 1.49  COMPREHENSIVE METABOLIC PANEL     Status: Abnormal   Collection Time    01/02/14  2:55 AM      Result Value Ref Range   Sodium 135 (*) 137 - 147 mEq/L   Potassium 4.6  3.7 - 5.3 mEq/L   Chloride 96  96 - 112 mEq/L   CO2 26  19 - 32 mEq/L   Glucose, Bld 110 (*) 70 - 99 mg/dL   BUN 4 (*) 6 - 23 mg/dL   Creatinine, Ser 0.79  0.50 - 1.35 mg/dL   Calcium 9.2  8.4 - 10.5 mg/dL   Total Protein 5.8 (*) 6.0 - 8.3 g/dL   Albumin 3.0 (*) 3.5 - 5.2 g/dL   AST 64 (*) 0 - 37 U/L   ALT 100 (*) 0 - 53 U/L   Alkaline Phosphatase 57  39 - 117 U/L   Total Bilirubin 0.3  0.3 - 1.2 mg/dL   GFR calc non Af Amer >90  >90 mL/min   GFR calc Af Amer >90  >90 mL/min   Comment: (NOTE)     The eGFR has been calculated using the CKD EPI equation.     This  calculation has not been validated in all clinical situations.     eGFR's persistently <90 mL/min signify possible Chronic Kidney     Disease.  MAGNESIUM     Status: None   Collection Time    01/02/14  2:55 AM      Result Value Ref Range   Magnesium 1.5  1.5 - 2.5 mg/dL  LIPID PANEL     Status: None   Collection Time    01/02/14  2:55 AM      Result Value Ref Range   Cholesterol 148  0 - 200 mg/dL   Triglycerides 75  <150 mg/dL   HDL 46  >39 mg/dL   Total CHOL/HDL Ratio 3.2     VLDL 15  0 - 40 mg/dL   LDL Cholesterol 87  0 - 99 mg/dL   Comment:            Total Cholesterol/HDL:CHD Risk     Coronary Heart Disease Risk Table                         Men   Women      1/2 Average Risk   3.4   3.3      Average Risk       5.0   4.4      2 X Average Risk   9.6   7.1      3 X Average Risk  23.4   11.0                Use the calculated Patient Ratio     above and the CHD Risk Table  to determine the patient's CHD Risk.                ATP III CLASSIFICATION (LDL):      <100     mg/dL   Optimal      100-129  mg/dL   Near or Above                        Optimal      130-159  mg/dL   Borderline      160-189  mg/dL   High      >190     mg/dL   Very High  HEPARIN LEVEL (UNFRACTIONATED)     Status: None   Collection Time    01/03/14  2:20 AM      Result Value Ref Range   Heparin Unfractionated 0.36  0.30 - 0.70 IU/mL   Comment:            IF HEPARIN RESULTS ARE BELOW     EXPECTED VALUES, AND PATIENT     DOSAGE HAS BEEN CONFIRMED,     SUGGEST FOLLOW UP TESTING     OF ANTITHROMBIN III LEVELS.     Performed at [facility]  CBC     Status: Abnormal   Collection Time    01/03/14  2:20 AM      Result Value Ref Range   WBC 6.7  4.0 - 10.5 K/uL   RBC 4.00 (*) 4.22 - 5.81 MIL/uL   Hemoglobin 13.5  13.0 - 17.0 g/dL   HCT 39.4  39.0 - 52.0 %   MCV 98.5  78.0 - 100.0 fL   MCH 33.8  26.0 - 34.0 pg   MCHC 34.3  30.0 - 36.0 g/dL   RDW 13.7  11.5 - 15.5 %    Platelets 238  150 - 400 K/uL  PROTIME-INR     Status: Abnormal   Collection Time    01/03/14  2:20 AM      Result Value Ref Range   Prothrombin Time 20.1 (*) 11.6 - 15.2 seconds   INR 1.77 (*) 0.00 - 1.49  COMPREHENSIVE METABOLIC PANEL     Status: Abnormal   Collection Time    01/03/14  2:20 AM      Result Value Ref Range   Sodium 133 (*) 137 - 147 mEq/L   Potassium 4.0  3.7 - 5.3 mEq/L   Chloride 96  96 - 112 mEq/L   CO2 25  19 - 32 mEq/L   Glucose, Bld 137 (*) 70 - 99 mg/dL   BUN 5 (*) 6 - 23 mg/dL   Creatinine, Ser 0.83  0.50 - 1.35 mg/dL   Calcium 9.1  8.4 - 10.5 mg/dL   Total Protein 5.9 (*) 6.0 - 8.3 g/dL   Albumin 3.1 (*) 3.5 - 5.2 g/dL   AST 63 (*) 0 - 37 U/L   ALT 93 (*) 0 - 53 U/L   Alkaline Phosphatase 60  39 - 117 U/L   Total Bilirubin 0.2 (*) 0.3 - 1.2 mg/dL   GFR calc non Af Amer >90  >90 mL/min   GFR calc Af Amer >90  >90 mL/min   Comment: (NOTE)     The eGFR has been calculated using the CKD EPI equation.     This calculation has not been validated in all clinical situations.     eGFR's persistently <90 mL/min signify possible Chronic Kidney  Disease.  MAGNESIUM     Status: None   Collection Time    01/03/14  2:20 AM      Result Value Ref Range   Magnesium 1.9  1.5 - 2.5 mg/dL  HEPARIN LEVEL (UNFRACTIONATED)     Status: Abnormal   Collection Time    01/04/14  3:05 AM      Result Value Ref Range   Heparin Unfractionated 0.82 (*) 0.30 - 0.70 IU/mL   Comment:            IF HEPARIN RESULTS ARE BELOW     EXPECTED VALUES, AND PATIENT     DOSAGE HAS BEEN CONFIRMED,     SUGGEST FOLLOW UP TESTING     OF ANTITHROMBIN III LEVELS.  CBC     Status: Abnormal   Collection Time    01/04/14  3:05 AM      Result Value Ref Range   WBC 7.9  4.0 - 10.5 K/uL   RBC 4.12 (*) 4.22 - 5.81 MIL/uL   Hemoglobin 14.0  13.0 - 17.0 g/dL   HCT 40.5  39.0 - 52.0 %   MCV 98.3  78.0 - 100.0 fL   MCH 34.0  26.0 - 34.0 pg   MCHC 34.6  30.0 - 36.0 g/dL   RDW 13.9  11.5 - 15.5  %   Platelets 274  150 - 400 K/uL  PROTIME-INR     Status: Abnormal   Collection Time    01/04/14  3:05 AM      Result Value Ref Range   Prothrombin Time 23.4 (*) 11.6 - 15.2 seconds   INR 2.16 (*) 0.00 - 1.49  COMPREHENSIVE METABOLIC PANEL     Status: Abnormal   Collection Time    01/04/14  3:05 AM      Result Value Ref Range   Sodium 137  137 - 147 mEq/L   Potassium 4.3  3.7 - 5.3 mEq/L   Chloride 98  96 - 112 mEq/L   CO2 22  19 - 32 mEq/L   Glucose, Bld 101 (*) 70 - 99 mg/dL   BUN 5 (*) 6 - 23 mg/dL   Creatinine, Ser 0.74  0.50 - 1.35 mg/dL   Calcium 9.6  8.4 - 10.5 mg/dL   Total Protein 6.4  6.0 - 8.3 g/dL   Albumin 3.2 (*) 3.5 - 5.2 g/dL   AST 68 (*) 0 - 37 U/L   ALT 93 (*) 0 - 53 U/L   Alkaline Phosphatase 59  39 - 117 U/L   Total Bilirubin 0.2 (*) 0.3 - 1.2 mg/dL   GFR calc non Af Amer >90  >90 mL/min   GFR calc Af Amer >90  >90 mL/min   Comment: (NOTE)     The eGFR has been calculated using the CKD EPI equation.     This calculation has not been validated in all clinical situations.     eGFR's persistently <90 mL/min signify possible Chronic Kidney     Disease.  MAGNESIUM     Status: None   Collection Time    01/04/14  3:05 AM      Result Value Ref Range   Magnesium 1.8  1.5 - 2.5 mg/dL  VITAMIN B12     Status: None   Collection Time    01/04/14  3:05 AM      Result Value Ref Range   Vitamin B-12 437  211 - 911 pg/mL   Comment: Performed at [facility]  FOLATE     Status: None   Collection Time    01/04/14  3:05 AM      Result Value Ref Range   Folate 9.3     Comment: (NOTE)     Reference Ranges            Deficient:       0.4 - 3.3 ng/mL            Indeterminate:   3.4 - 5.4 ng/mL            Normal:              > 5.4 ng/mL     Performed at [place] [facility]     Status: None   Collection Time    01/04/14  3:05 AM      Result Value Ref Range   Iron 71  42 - 135 ug/dL   TIBC 280  215 - 435 ug/dL   Saturation Ratios 25  20  - 55 %   UIBC 209  125 - 400 ug/dL   Comment: Performed at [place]     Status: Abnormal   Collection Time    01/04/14  3:05 AM      Result Value Ref Range   Ferritin 1706 (*) 22 - 322 ng/mL   Comment: (NOTE)     Result repeated and verified.     Result confirmed by automatic dilution.     Performed at [place]     Status: Abnormal   Collection Time    01/04/14  3:05 AM      Result Value Ref Range   Retic Ct Pct 3.5 (*) 0.4 - 3.1 %   RBC. 4.12 (*) 4.22 - 5.81 MIL/uL   Retic Count, Manual 144.2  19.0 - 186.0 K/uL  AMMONIA     Status: None   Collection Time    01/04/14  3:05 AM      Result Value Ref Range   Ammonia 34  11 - 60 umol/L   Labs are reviewed and are pertinent for elevated LFT's.  Current Facility-Administered Medications  Medication Dose Route Frequency Provider Last Rate Last Dose  . acetaminophen (TYLENOL) tablet 650 mg  650 mg Oral Q6H PRN Rexene Alberts, MD   650 mg at 01/02/14 0915   Or  . acetaminophen (TYLENOL) suppository 650 mg  650 mg Rectal Q6H PRN Rexene Alberts, MD      . busPIRone (BUSPAR) tablet 15 mg  15 mg Oral BID Rexene Alberts, MD   15 mg at 01/04/14 1165  . Chlorhexidine Gluconate Cloth 2 % PADS 6 each  6 each Topical Q0600 Allie Bossier, MD   6 each at 01/03/14 [phone]  . diltiazem (CARDIZEM SR) 12 hr capsule 180 mg  180 mg Oral Q12H Allie Bossier, MD   180 mg at 01/04/14 8333  . feeding supplement (ENSURE COMPLETE) (ENSURE COMPLETE) liquid 237 mL  237 mL Oral BID BM Erlene Quan, RD   237 mL at 01/04/14 1000  . FLUoxetine (PROZAC) capsule 20 mg  20 mg Oral Daily Allie Bossier, MD   20 mg at 01/04/14 8329  . folic acid (FOLVITE) tablet 1 mg  1 mg Oral Daily Rexene Alberts, MD   1 mg at 01/04/14 1916  . levalbuterol (XOPENEX) nebulizer solution 0.63 mg  0.63 mg Nebulization Q6H PRN Rexene Alberts, MD      .  LORazepam (ATIVAN) tablet 1 mg  1 mg Oral Q4H PRN Cherene Altes, MD   1 mg at 01/03/14  2023   Or  . LORazepam (ATIVAN) injection 1 mg  1 mg Intravenous Q4H PRN Cherene Altes, MD      . metoprolol tartrate (LOPRESSOR) tablet 25 mg  25 mg Oral BID Evans Lance, MD   25 mg at 01/04/14 8590  . morphine 2 MG/ML injection 1-2 mg  1-2 mg Intravenous Q4H PRN Samella Parr, NP      . multivitamin with minerals tablet 1 tablet  1 tablet Oral Daily Rexene Alberts, MD   1 tablet at 01/04/14 [id number]  . ondansetron (ZOFRAN) tablet 4 mg  4 mg Oral Q6H PRN Rexene Alberts, MD       Or  . ondansetron [facility]) injection 4 mg  4 mg Intravenous Q6H PRN Rexene Alberts, MD      . oxyCODONE (Oxy IR/ROXICODONE) immediate release tablet 5 mg  5 mg Oral Q4H PRN Samella Parr, NP   5 mg at 12/31/13 1034  . pantoprazole (PROTONIX) EC tablet 40 mg  40 mg Oral BID Allie Bossier, MD   40 mg at 01/04/14 2162  . thiamine (VITAMIN B-1) tablet 100 mg  100 mg Oral Daily Rexene Alberts, MD   100 mg at 01/04/14 4469  . warfarin (COUMADIN) tablet 7.5 mg  7.5 mg Oral ONCE-1800 Darnell Level Mancheril, RPH      . Warfarin - Pharmacist Dosing Inpatient   Does not apply [place], [facility]        Psychiatric Specialty Exam: Physical Exam  Review of Systems  Psychiatric/Behavioral: Positive for depression and hallucinations. The patient is nervous/anxious and has insomnia.     Blood pressure 98/81, pulse 74, temperature 98.4 F (36.9 C), temperature source Oral, resp. rate 18, height 6' 3"  (1.905 m), weight 88.8 kg (195 lb 12.3 oz), SpO2 98.00%.Body mass index is 24.47 kg/(m^2).  General Appearance: Casual  Eye Contact::  Good  Speech:  Clear and Coherent  Volume:  Decreased  Mood:  Anxious and Depressed  Affect:  Appropriate and Congruent  Thought Process:  Coherent and Goal Directed  Orientation:  Full (Time, Place, and Person)  Thought Content:  WDL  Suicidal Thoughts:  No  Homicidal Thoughts:  No  Memory:  Immediate;   Good Recent;   Good  Judgement:  Good  Insight:  Good  Psychomotor  Activity:  Decreased  Concentration:  Good  Recall:  Good  Fund of Knowledge:Good  Language: Good  Akathisia:  NA  Handed:  Right  AIMS (if indicated):     Assets:  Communication Skills Desire for Improvement Financial Resources/Insurance Housing Leisure Time Resilience Social Support Talents/Skills Transportation Vocational/Educational  Sleep:      Musculoskeletal: Strength & Muscle Tone: within normal limits Gait & Station: normal Patient leans: N/A  Treatment Plan Summary: Daily contact with patient to assess and evaluate symptoms and progress in treatment Medication management  JONNALAGADDA,JANARDHAHA R. 01/04/2014 1:55 PM

## 2014-01-04 NOTE — Progress Notes (Signed)
[place] TEAM 1 - Stepdown/ICU TEAM Progress Note  Matthew [name] ERD:[id number] DOB: [birth year] DOA: 12/29/2013 PCP: Glo Herring., MD  Admit HPI / Brief Narrative: 38 y.o. WM PMHx depression, alcohol abuse, transposition of great vessels, chronic systolic CHF, HTN, pulmonary hypertension, HLD. Presented with nausea and vomiting; generalized weakness. 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 He presented with a 2 to three-day history of loose stools numbering approximately 3 daily, nausea and vomiting with 5-6 episodes of emesis daily and generalized weakness. He denied abdominal pain, pain with urination, fever, or chills. He did have black tarry stools twice, but no bright red blood per rectum. He denied coffee grounds emesis. He denied chest pain or shortness of breath. He has had shakiness as he has been trying to decrease his alcohol intake. Over the past several months, he has been drinking heavily because of recent stressors in his family. His wife has left him with their 3 children and his father recently died unexpectedly of a massive stroke. He admittted to depression, but denied suicidal ideation. His primary care provider had recently increased the dose of Prozac from 20 mg to 40 mg daily. He believes that the increase may have initiated the gastrointestinal symptoms. He had recently been treated with doxycycline approximately 3 weeks ago for treatment of [facility] spotted fever. He has been drinking 6-12 beers daily with shots of vodka or he drinks a pint of vodka or other hard liquor daily. He has been attempting to cut down but finds it very difficult to do so. He denied alcohol withdrawal seizures or previous DTs. He denies any history of diabetes mellitus.  In the ED, the patient was initially mildly tachycardic, but developed a heart rate in the 200-250 range. Per the ED  physician, it appeared that his rhythm may have been PSVT. The initial EKG revealed questionable atrial fibrillation and other abnormalities with a heart rate of 228 beats per minute. He was given diltiazem IV and a total of 18 mg of IV adenosine. The followup EKG revealed sinus tachycardia with a heart rate of 123 beats per minute, prolonged QT interval, and nonspecific ST and T wave abnormalities. His chest x-ray reveals no acute cardiopulmonary disease. His lab data are significant for a serum sodium of 124, potassium of 2.5, glucose of 185, magnesium of 1.1, normal lipase, ALT of 188, AST is 175, point-of-care troponin I 0, and lactic acid 2.10. He is being admitted for further evaluation and management.   HPI/Subjective: 6/23 patient resting comfortably in in bed, states understands the new plan is for him to become therapeutic on anti-coagulation and have heart rate controlled prior to discharge. Complains of hallucinations at night seeing people and objects which are not actually there, having broken sleep (getting up during the night several times).  Assessment/Plan: Complete transposition of great vessels  -Cards consulted given h/o acute atrial arrhythmia   Nausea vomiting and diarrhea/Melena  -Resolved  -6/18 MRSA by PCR positive  Atrial flutter  -EKG 6/18 revealed atrial flutter with 2:1 conduction pattern  -Chronic alcoholism also contributing factor  -Continue IV heparin and Coumadin  -Per cardiology patient will be anticoagulated for one month prior to DCCV -6/18 echocardiogram; chronic systolic CHF, pulmonary hypertension see results below  -6/23 continue Cardizem PO 180 mg BID, patient's HR still not fully controlled;BP remain borderline may have to consider adding amiodarone -Continue metoprolol 25 mg BID  Chronic systolic CHF  -Per cardiology  -Control atrial flutter see above -HTN currently controlled   Pulmonary hypertension  -Once atrial flutter controlled if  patient's BP will tolerate will add Imdur    Dehydration with hyponatremia/Metabolic acidosis  -Lactic acid had increased on 6/18 after admission and was likely related to recent tachycardia and inadequate perfusion related to same- today is down to 0.7  -6/20 resolved  Elevated transaminase level  -Likely from ongoing alcohol use but recent tachycardia and dehydration could explain his weight  -Abdominal ultrasound severe FLD but no cirrhosis  -6/21 continued to trend down    Hypokalemia/Hypomagnesemia/ Hyperglycemia  -Follow labs and replete as indicated   Alcohol abuse/Alcohol withdrawal  -Continue CIWA  -Once medically stable we'll ask psych social worker to evaluate the patient and provide outpatient program  -Patient apparently established with primary care physician who is managing medication for depression  -6/21 patient's CIWA protocol was inadvertently discontinued, which resulted in patients heart rate shooting into the high 120s and 130s, patient became diaphoretic. Restarted CIWA protocol patient's heart rate now between 80-100. DO NOT STOP CIWA PROTOCOL WITHOUT CONTACTING ADMITTING TEAM  HYPERLIPIDEMIA-MIXED  -lipid panel; within AHA guideline  HYPERTENSION, BENIGN  -Cozaar on hold 2/2 relative hypotension-BP remains soft   Depression  -6/20 restart Prozac 20 mg daily   -Continue BuSpar 15mg  BID  Hospital delivered vs hallucination -Consulted behavioral health, and they recommend adding Seroquel 50 mg QHS, and if patient does well discharge in 24-48 hours.     Code Status: FULL Family Communication: no family present at time of exam Disposition Plan: Therapeutic on Coumadin and heart rate controlled    Consultants: Dr. Crissie Sickles (cardiology) Dr.JONNALAGADDA,JANARDHAHA R (psychiatry)     Procedure/Significant Events: 6/17 PCXR;No active cardiopulmonary disease. 6/17 ultrasound abdomen complete; No acute findings, Extensive hepatic  steatosis   Culture -6/18 MRSA by PCR positive  Antibiotics: NA  DVT prophylaxis: Heparin and Coumadin   Devices NA   LINES / TUBES:  6/17 20ga right antecubital    Continuous Infusions:    Objective: VITAL SIGNS: Temp: 98.3 F (36.8 C) (06/23 1949) Temp src: Oral (06/23 1949) BP: 114/81 mmHg (06/23 1950) Pulse Rate: 126 (06/23 1950) SPO2; 100% on room air FIO2:   Intake/Output Summary (Last 24 hours) at 01/04/14 2114 Last data filed at 01/04/14 1200  Gross per 24 hour  Intake    240 ml  Output      0 ml  Net    240 ml     Exam: General: A./O. x4, NAD, No acute respiratory distress Lungs: Clear to auscultation bilaterally without wheezes or crackles Cardiovascular: Tachycardia arrhythmia, Regular rhythm without murmur gallop or rub normal S1 and S2 Abdomen: Nontender, nondistended, soft, bowel sounds positive, no rebound, no ascites, no appreciable mass Extremities: No significant cyanosis, clubbing, or edema bilateral lower extremities  Data Reviewed: Basic Metabolic Panel:  Recent Labs Lab 12/29/13 1839  12/30/13 1043 12/31/13 0444 01/01/14 0230 01/02/14 0255 01/03/14 0220 01/04/14 0305  NA 127*  < > 130* 132* 133* 135* 133* 137  K 3.0*  < > 4.7 3.8 4.6 4.6 4.0 4.3  CL 82*  < > 94* 93* 90* 96 96 98  CO2 23  --  23 26 28 26 25 22   GLUCOSE 186*  < > 91 99 107* 110* 137* 101*  BUN 10  < > 7 6 3* 4* 5* 5*  CREATININE 0.90  < > 0.71 0.71 0.70 0.79 0.83 0.74  CALCIUM  9.1  --  8.7 8.7 9.5 9.2 9.1 9.6  MG 1.1*  --  1.6  --   --  1.5 1.9 1.8  < > = values in this interval not displayed. Liver Function Tests:  Recent Labs Lab 12/30/13 1043 12/31/13 0444 01/02/14 0255 01/03/14 0220 01/04/14 0305  AST 124* 119* 64* 63* 68*  ALT 144* 137* 100* 93* 93*  ALKPHOS 58 63 57 60 59  BILITOT 0.9 0.6 0.3 0.2* 0.2*  PROT 5.5* 5.5* 5.8* 5.9* 6.4  ALBUMIN 3.0* 3.1* 3.0* 3.1* 3.2*    Recent Labs Lab 12/29/13 2043  LIPASE 46    Recent Labs Lab  01/04/14 0305  AMMONIA 34   CBC:  Recent Labs Lab 12/29/13 1839  12/31/13 0444 01/01/14 0230 01/02/14 0255 01/03/14 0220 01/04/14 0305  WBC 12.0*  < > 6.5 8.1 6.5 6.7 7.9  NEUTROABS 9.4*  --   --   --   --   --   --   HGB 17.4*  < > 13.7 14.6 13.4 13.5 14.0  HCT 47.1  < > 38.6* 42.1 38.9* 39.4 40.5  MCV 93.8  < > 96.7 98.1 98.7 98.5 98.3  PLT 233  < > 170 221 213 238 274  < > = values in this interval not displayed. Cardiac Enzymes:  Recent Labs Lab 12/29/13 2122 12/30/13 1043  TROPONINI <0.30 <0.30   BNP (last 3 results) No results found for this basename: PROBNP,  in the last 8760 hours CBG:  Recent Labs Lab 12/30/13 1150 12/30/13 1633 12/30/13 2015 12/30/13 2353 12/31/13 0411  GLUCAP 91 94 166* 107* 99    Recent Results (from the past 240 hour(s))  MRSA PCR SCREENING     Status: Abnormal   Collection Time    12/30/13 12:44 AM      Result Value Ref Range Status   MRSA by PCR POSITIVE (*) NEGATIVE Final   Comment:            The GeneXpert MRSA Assay (FDA     approved for NASAL specimens     only), is one component of a     comprehensive MRSA colonization     surveillance program. It is not     intended to diagnose MRSA     infection nor to guide or     monitor treatment for     MRSA infections.     Studies:  Recent x-ray studies have been reviewed in detail by the Attending Physician  Scheduled Meds:  Scheduled Meds: . busPIRone  15 mg Oral BID  . Chlorhexidine Gluconate Cloth  6 each Topical Q0600  . diltiazem  180 mg Oral Q12H  . feeding supplement (ENSURE COMPLETE)  237 mL Oral BID BM  . FLUoxetine  20 mg Oral Daily  . folic acid  1 mg Oral Daily  . metoprolol tartrate  25 mg Oral BID  . multivitamin with minerals  1 tablet Oral Daily  . pantoprazole  40 mg Oral BID  . QUEtiapine  50 mg Oral QHS  . thiamine  100 mg Oral Daily  . Warfarin - Pharmacist Dosing Inpatient   Does not apply q1800    Time spent on care of this patient: 40  mins   Allie Bossier , MD   [facility] Office  [phone] Pager [phone]  On-Call/Text Page:      [email or web address]      password [email or web address]  If 7PM-7AM, please contact night-coverage [email or web address] Password [email or web address]  01/04/2014, 9:14 PM   LOS: 6 days

## 2014-01-04 NOTE — Progress Notes (Signed)
[place] for Coumadin Indication: atrial fibrillation  Allergies  Allergen Reactions  . Aspirin     Patient Measurements: Height: 6\' 3"  (190.5 cm) Weight: 195 lb 12.3 oz (88.8 kg) IBW/kg (Calculated) : 84.5  Vital Signs: Temp: 98.9 F (37.2 C) (06/23 0753) Temp src: Oral (06/23 0753) BP: 102/51 mmHg (06/23 0753)  Labs:  Recent Labs  01/02/14 0255 01/03/14 0220 01/04/14 0305  HGB 13.4 13.5 14.0  HCT 38.9* 39.4 40.5  PLT 213 238 274  LABPROT 15.6* 20.1* 23.4*  INR 1.27 1.77* 2.16*  HEPARINUNFRC 0.41 0.36 0.82*  CREATININE 0.79 0.83 0.74    Estimated Creatinine Clearance: 154 ml/min (by C-G formula based on Cr of 0.74).   Medical History: Past Medical History  Diagnosis Date  . Complete transposition of great vessels   . Hypertension   . Dyslipidemia   . Depression   . Alcohol abuse   . [place] spotted fever 11/2013    Medications:  Prescriptions prior to admission  Medication Sig Dispense Refill  . ALPRAZolam (XANAX) 0.5 MG tablet Take 0.5 mg by mouth 3 (three) times daily as needed for anxiety.       . busPIRone (BUSPAR) 15 MG tablet Take 15 mg by mouth 2 (two) times daily.      Marland Kitchen FLUoxetine (PROZAC) 20 MG capsule Take 20 mg by mouth daily.      Marland Kitchen loperamide (IMODIUM) 2 MG capsule Take 1 capsule (2 mg total) by mouth 4 (four) times daily as needed for diarrhea or loose stools.  12 capsule  0  . losartan (COZAAR) 50 MG tablet Take 50 mg by mouth daily.       Scheduled:  . busPIRone  15 mg Oral BID  . Chlorhexidine Gluconate Cloth  6 each Topical Q0600  . diltiazem  180 mg Oral Q12H  . feeding supplement (ENSURE COMPLETE)  237 mL Oral BID BM  . FLUoxetine  20 mg Oral Daily  . folic acid  1 mg Oral Daily  . metoprolol tartrate  25 mg Oral BID  . multivitamin with minerals  1 tablet Oral Daily  . pantoprazole  40 mg Oral BID  . thiamine  100 mg Oral Daily  . Warfarin - Pharmacist Dosing Inpatient   Does not  apply q1800   Assessment: 38 yo who was admitted for afib and other issues. Patient is currently on Coumadin with Lovenox bridge. INR is therapeutic today.  No signs of bleeding. No complications noted.  H/H and plts are stable.   Goal of Therapy:  Monitor platelets by anticoagulation protocol: Yes INR 2-3   Plan:  1. Stop Lovenox since INR > 2  2. Coumadin 7.5 mg po x 1 tonight. 3. Continue daily PT/INR. 4. Consider discharge on Coumadin 7.5 mg daily and f/u INR in 3 days   Albertina Parr, PharmD.  Clinical Pharmacist Pager [phone]

## 2014-01-05 ENCOUNTER — Other Ambulatory Visit: Payer: Self-pay

## 2014-01-05 DIAGNOSIS — I4892 Unspecified atrial flutter: Secondary | ICD-10-CM

## 2014-01-05 LAB — COMPREHENSIVE METABOLIC PANEL
ALT: 84 U/L — ABNORMAL HIGH (ref 0–53)
AST: 49 U/L — ABNORMAL HIGH (ref 0–37)
Albumin: 3 g/dL — ABNORMAL LOW (ref 3.5–5.2)
Alkaline Phosphatase: 63 U/L (ref 39–117)
BUN: 10 mg/dL (ref 6–23)
CO2: 24 mEq/L (ref 19–32)
Calcium: 9.3 mg/dL (ref 8.4–10.5)
Chloride: 98 mEq/L (ref 96–112)
Creatinine, Ser: 0.97 mg/dL (ref 0.50–1.35)
GFR calc Af Amer: >90 mL/min (ref >90)
GFR calc non Af Amer: >90 mL/min (ref >90)
Glucose, Bld: 116 mg/dL — ABNORMAL HIGH (ref 70–99)
Potassium: 4.2 mEq/L (ref 3.7–5.3)
Sodium: 138 mEq/L (ref 137–147)
Total Bilirubin: <0.2 mg/dL — ABNORMAL LOW (ref 0.3–1.2)
Total Protein: 6.1 g/dL (ref 6.0–8.3)

## 2014-01-05 LAB — MAGNESIUM: Magnesium: 1.9 mg/dL (ref 1.5–2.5)

## 2014-01-05 LAB — PROTIME-INR
INR: 2.43 — ABNORMAL HIGH (ref 0.00–1.49)
Prothrombin Time: 25.6 seconds — ABNORMAL HIGH (ref 11.6–15.2)

## 2014-01-05 MED ORDER — WARFARIN SODIUM 7.5 MG PO TABS: 7.5000 mg | ORAL_TABLET | Freq: Every day | ORAL | Status: DC

## 2014-01-05 MED ORDER — METOPROLOL TARTRATE 50 MG PO TABS: 50.0000 mg | ORAL_TABLET | Freq: Two times a day (BID) | ORAL | Status: DC

## 2014-01-05 MED ORDER — ENSURE COMPLETE PO LIQD: 237.0000 mL | Freq: Two times a day (BID) | ORAL | Status: DC

## 2014-01-05 MED ORDER — QUETIAPINE FUMARATE ER 50 MG PO TB24: 50.0000 mg | ORAL_TABLET | Freq: Every day | ORAL | Status: DC

## 2014-01-05 MED ORDER — METOPROLOL TARTRATE 50 MG PO TABS
50.0000 mg | ORAL_TABLET | Freq: Two times a day (BID) | ORAL | Status: DC
  Administered 2014-01-05: 50 mg via ORAL
  Filled 2014-01-05 (×2): qty 1

## 2014-01-05 MED ORDER — DILTIAZEM HCL ER 90 MG PO CP12: 180.0000 mg | ORAL_CAPSULE | Freq: Two times a day (BID) | ORAL | Status: DC

## 2014-01-05 MED ORDER — FOLIC ACID 1 MG PO TABS: 1.0000 mg | ORAL_TABLET | Freq: Every day | ORAL | Status: DC

## 2014-01-05 MED ORDER — PANTOPRAZOLE SODIUM 40 MG PO TBEC: 40.0000 mg | DELAYED_RELEASE_TABLET | Freq: Two times a day (BID) | ORAL | Status: DC

## 2014-01-05 MED ORDER — ALPRAZOLAM 0.5 MG PO TABS: 0.5000 mg | ORAL_TABLET | Freq: Three times a day (TID) | ORAL | Status: DC | PRN

## 2014-01-05 MED ORDER — WARFARIN SODIUM 5 MG PO TABS
7.5000 mg | ORAL_TABLET | Freq: Every day | ORAL | Status: DC
  Filled 2014-01-05: qty 1.5

## 2014-01-05 NOTE — Progress Notes (Signed)
CSW checked in with patient today regarding his current etoh use. Patient states a Education officer, museum already spoke to him and provided him with resources. Patient states he already quit drinking and is determined not to go back to drinking. Patient thanked Education officer, museum for visiting. CSW signing off at this time. Please re consult if social work needs arise.  Jeanette Caprice, MSW, [place]

## 2014-01-05 NOTE — Discharge Summary (Signed)
Physician Discharge Summary  Matthew [name] MAU:[id number] DOB: 10/15/1975 DOA: 12/29/2013  PCP: Glo Herring., MD  Admit date: 12/29/2013 Discharge date: 01/05/2014  Time spent: 35 minutes  Recommendations for Outpatient Follow-up:  1. Pt/INR on Friday 2. Periodic CBC, BMP 3. Alcohol abstinence   Discharge Diagnoses:  Active Problems:   HYPERLIPIDEMIA-MIXED   HYPERTENSION, BENIGN   Complete transposition of great vessels   Nausea vomiting and diarrhea   Atrial flutter   Hypokalemia   Hypomagnesemia   Alcohol abuse   Alcohol withdrawal   Dehydration with hyponatremia   Depression   Hyperglycemia   Melena   Elevated transaminase level   Metabolic acidosis   Protein-calorie malnutrition, severe   Pulmonary hypertension   Transposition of great vessels   Chronic systolic CHF (congestive heart failure)   Hallucination   Discharge Condition: improved  Diet recommendation: cardiac  Filed Weights   01/03/14 0500 01/04/14 0442 01/05/14 0539  Weight: 90.4 kg (199 lb 4.7 oz) 88.8 kg (195 lb 12.3 oz) 89.086 kg (196 lb 6.4 oz)    History of present illness:  Matthew [name] is a 38 y.o. male Nausea and vomiting; generalized weakness. 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 He presents to the emergency department with a 2 to three-day history of loose stools numbering approximately 3 daily, nausea and vomiting with 5-6 episodes of emesis daily and generalized weakness. He denies abdominal pain, pain with urination, fever, or chills. He did have black tarry stools twice, but no bright red blood per rectum. He denies coffee grounds emesis. He denies chest pain or shortness of breath. He has had shakiness as he has been trying to decrease his alcohol intake. Over the past several months, he has been drinking heavily because of recent stressors in  his family. His wife has left him with their 3 children and his father recently died unexpectedly of a massive stroke. He admits to depression, but denies suicidal ideation. His primary care provider had recently increased the dose of Prozac from 20 mg to 40 mg daily. He believes that the increase may have initiated the gastrointestinal symptoms. He was also treated with doxycycline approximately 3 weeks ago for treatment of [facility] spotted fever. He has been drinking 6-12 beers daily with shots of vodka or he drinks a pint of vodka or other hard liquor daily. He has been attempting to cut down but finds it very difficult to do so. He denies alcohol withdrawal seizures or previous DTs. He denies any history of diabetes mellitus.  In the ED, the patient was initially mildly tachycardic, but developed a heart rate in the 200-250 range. Per the ED physician, it appeared that his rhythm may have been PSVT or some type of tachyarrhythmia. The initial EKG revealed questionable atrial fibrillation and other abnormalities with a heart rate of 228 beats per minute. He was given diltiazem IV and a total of 18 mg of IV adenosine. The followup EKG revealed sinus tachycardia with a heart rate of 123 beats per minute, prolonged QT interval, and nonspecific ST and T wave abnormalities. His chest x-ray reveals no acute cardiopulmonary disease. His lab data are significant for a serum sodium of 124, potassium of 2.5, glucose of 185, magnesium of 1.1, normal lipase, ALT of 188, AST is 175, point-of-care troponin I 0, and lactic acid 2.10. He is being admitted for further evaluation and management.  Hospital Course:  Complete transposition of great vessels  -Cards consulted given h/o acute atrial arrhythmia   Nausea vomiting and diarrhea/Melena  -Resolved    Atrial flutter  -EKG 6/18 revealed atrial flutter with 2:1 conduction pattern  -Chronic alcoholism also contributing factor  -Coumadin  -Per  cardiology patient will be anticoagulated for one month prior to DCCV  -6/18 echocardiogram; chronic systolic CHF, pulmonary hypertension see results below  -6/23 continue Cardizem and metoprolol  Chronic systolic CHF  -Per cardiology  -Control atrial flutter see above  -HTN currently controlled   Pulmonary hypertension   Dehydration with hyponatremia/Metabolic acidosis  -Lactic acid had increased on 6/18 after admission and was likely related to recent tachycardia and inadequate perfusion related to same- today is down to 0.7  -6/20 resolved   Elevated transaminase level  -Likely from ongoing alcohol use -Abdominal ultrasound severe FLD but no cirrhosis  -6/21 continued to trend down   Alcohol abuse/Alcohol withdrawal  -outpatient programs via [employer] [employer]  -Patient apparently established with primary care physician who is managing medication for depression    HYPERLIPIDEMIA-MIXED  -lipid panel; within AHA guideline   HYPERTENSION, BENIGN   Depression  -6/20 restart Prozac 20 mg daily  -Continue BuSpar 15mg  BID      Procedures:  abd u/s  Consultations:  Cards  psych  Discharge Exam: Filed Vitals:   01/05/14 0539  BP: 101/63  Pulse: 102  Temp: 98.3 F (36.8 C)  Resp: 18    General: A+Ox3, NAD- wanting to go home Cardiovascular: rrr Respiratory: clear  Discharge Instructions You were cared for by a hospitalist during your hospital stay. If you have any questions about your discharge medications or the care you received while you were in the hospital after you are discharged, you can call the unit and asked to speak with the hospitalist on call if the hospitalist that took care of you is not available. Once you are discharged, your primary care physician will handle any further medical issues. Please note that NO REFILLS for any discharge medications will be authorized once you are discharged, as it is imperative that you return to your primary care  physician (or establish a relationship with a primary care physician if you do not have one) for your aftercare needs so that they can reassess your need for medications and monitor your lab values.      Discharge Instructions   Diet - low sodium heart healthy    Complete by:  As directed      Discharge instructions    Complete by:  As directed   Friday for PT/INR and an ecg     Increase activity slowly    Complete by:  As directed             Medication List    STOP taking these medications       loperamide 2 MG capsule  Commonly known as:  IMODIUM     losartan 50 MG tablet  Commonly known as:  COZAAR      TAKE these medications       ALPRAZolam 0.5 MG tablet  Commonly known as:  XANAX  Take 1 tablet (0.5 mg total) by mouth 3 (three) times daily as needed for anxiety.     busPIRone 15 MG tablet  Commonly known as:  BUSPAR  Take 15 mg by mouth 2 (two) times daily.     diltiazem 90 MG 12 hr capsule  Commonly known as:  CARDIZEM SR  Take 2 capsules (180 mg total) by mouth every 12 (twelve) hours.     feeding supplement (ENSURE COMPLETE) Liqd  Take 237 mLs by mouth 2 (two) times daily between meals.     FLUoxetine 20 MG capsule  Commonly known as:  PROZAC  Take 20 mg by mouth daily.     folic acid 1 MG tablet  Commonly known as:  FOLVITE  Take 1 tablet (1 mg total) by mouth daily.     metoprolol 50 MG tablet  Commonly known as:  LOPRESSOR  Take 1 tablet (50 mg total) by mouth 2 (two) times daily.     pantoprazole 40 MG tablet  Commonly known as:  PROTONIX  Take 1 tablet (40 mg total) by mouth 2 (two) times daily.     QUEtiapine 50 MG Tb24 24 hr tablet  Commonly known as:  SEROQUEL XR  Take 1 tablet (50 mg total) by mouth at bedtime.     warfarin 7.5 MG tablet  Commonly known as:  COUMADIN  Take 1 tablet (7.5 mg total) by mouth daily at 6 PM.       Allergies  Allergen Reactions  . Aspirin       The results of significant diagnostics from this  hospitalization (including imaging, microbiology, ancillary and laboratory) are listed below for reference.    Significant Diagnostic Studies: US Abdomen Complete  12/30/2013   CLINICAL DATA:  Elevated liver function tests.  EXAM: ULTRASOUND ABDOMEN COMPLETE  COMPARISON:  None.  FINDINGS: Gallbladder:  No gallstones or wall thickening visualized. No sonographic Murphy sign noted.  Common bile duct:  Diameter: 3.4 mm  Liver:  Echogenic with decreased through transmission of the sound beam. No liver mass or focal lesion. Normal hepatopetal flow in the portal vein.  IVC:  No abnormality visualized.  Pancreas:  Visualized portion unremarkable.  Spleen:  Size and appearance within normal limits.  Right Kidney:  Length: 12 cm. Echogenicity within normal limits. No mass or hydronephrosis visualized.  Left Kidney:  Length: 12.3 cm. Echogenicity within normal limits. No mass or hydronephrosis visualized.  Abdominal aorta:  No aneurysm visualized.  Other findings:  None.  IMPRESSION: 1. No acute findings. 2. Extensive hepatic steatosis.   Electronically Signed   By: Lajean Manes M.D.   On: 12/30/2013 19:41   Dg Chest Portable 1 View  12/29/2013   CLINICAL DATA:  Generalized weakness. Nausea, vomiting, diarrhea. History of transposition of the great vessels.  EXAM: PORTABLE CHEST - 1 VIEW  COMPARISON:  10/01/2012.  FINDINGS: Cardiopericardial silhouette is within normal for size. Median sternotomy. 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 There is no airspace disease. No pleural effusion. Monitoring leads project over the chest.  IMPRESSION: No active cardiopulmonary disease.   Electronically Signed   By: Dereck Ligas M.D.   On: 12/29/2013 19:02    Microbiology: Recent Results (from the past 240 hour(s))  MRSA PCR SCREENING     Status: Abnormal   Collection Time    12/30/13 12:44 AM      Result Value Ref Range Status   MRSA by PCR POSITIVE (*) NEGATIVE Final    Comment:            The GeneXpert MRSA Assay (FDA     approved for NASAL specimens     only), is one component of a     comprehensive MRSA colonization     surveillance program. It is not     intended  to diagnose MRSA     infection nor to guide or     monitor treatment for     MRSA infections.     Labs: Basic Metabolic Panel:  Recent Labs Lab 12/30/13 1043  01/01/14 0230 01/02/14 0255 01/03/14 0220 01/04/14 0305 01/05/14 0325  NA 130*  < > 133* 135* 133* 137 138  K 4.7  < > 4.6 4.6 4.0 4.3 4.2  CL 94*  < > 90* 96 96 98 98  CO2 23  < > 28 26 25 22 24   GLUCOSE 91  < > 107* 110* 137* 101* 116*  BUN 7  < > 3* 4* 5* 5* 10  CREATININE 0.71  < > 0.70 0.79 0.83 0.74 0.97  CALCIUM 8.7  < > 9.5 9.2 9.1 9.6 9.3  MG 1.6  --   --  1.5 1.9 1.8 1.9  < > = values in this interval not displayed. Liver Function Tests:  Recent Labs Lab 12/31/13 0444 01/02/14 0255 01/03/14 0220 01/04/14 0305 01/05/14 0325  AST 119* 64* 63* 68* 49*  ALT 137* 100* 93* 93* 84*  ALKPHOS 63 57 60 59 63  BILITOT 0.6 0.3 0.2* 0.2* <0.2*  PROT 5.5* 5.8* 5.9* 6.4 6.1  ALBUMIN 3.1* 3.0* 3.1* 3.2* 3.0*    Recent Labs Lab 12/29/13 2043  LIPASE 46    Recent Labs Lab 01/04/14 0305  AMMONIA 34   CBC:  Recent Labs Lab 12/29/13 1839  12/31/13 0444 01/01/14 0230 01/02/14 0255 01/03/14 0220 01/04/14 0305  WBC 12.0*  < > 6.5 8.1 6.5 6.7 7.9  NEUTROABS 9.4*  --   --   --   --   --   --   HGB 17.4*  < > 13.7 14.6 13.4 13.5 14.0  HCT 47.1  < > 38.6* 42.1 38.9* 39.4 40.5  MCV 93.8  < > 96.7 98.1 98.7 98.5 98.3  PLT 233  < > 170 221 213 238 274  < > = values in this interval not displayed. Cardiac Enzymes:  Recent Labs Lab 12/29/13 2122 12/30/13 1043  TROPONINI <0.30 <0.30   BNP: BNP (last 3 results) No results found for this basename: PROBNP,  in the last 8760 hours CBG:  Recent Labs Lab 12/30/13 1150 12/30/13 1633 12/30/13 2015 12/30/13 2353 12/31/13 0411  GLUCAP 91 94 166*  107* 99       Signed:  VANN, JESSICA  [facility] 01/05/2014, 11:37 AM

## 2014-01-05 NOTE — Progress Notes (Signed)
[place] for Coumadin Indication: atrial fibrillation  Allergies  Allergen Reactions  . Aspirin     Patient Measurements: Height: 6\' 3"  (190.5 cm) Weight: 196 lb 6.4 oz (89.086 kg) IBW/kg (Calculated) : 84.5  Vital Signs: Temp: 98.3 F (36.8 C) (06/24 0539) Temp src: Oral (06/24 0539) BP: 101/63 mmHg (06/24 0539) Pulse Rate: 102 (06/24 0539)  Labs:  Recent Labs  01/03/14 0220 01/04/14 0305 01/05/14 0325  HGB 13.5 14.0  --   HCT 39.4 40.5  --   PLT 238 274  --   LABPROT 20.1* 23.4* 25.6*  INR 1.77* 2.16* 2.43*  HEPARINUNFRC 0.36 0.82*  --   CREATININE 0.83 0.74 0.97    Estimated Creatinine Clearance: 127 ml/min (by C-G formula based on Cr of 0.97).    Assessment: 38 yo who was admitted for afib and other issues. Patient is currently on Coumadin with Lovenox bridge stopped yesterday. INR is therapeutic today after 5 doses of coumadin 7.5 mg.  No signs of bleeding. No complications noted.  H/H and plts are stable.   Goal of Therapy:  Monitor platelets by anticoagulation protocol: Yes INR 2-3   Plan:   rec dc home on Coumadin 7.5 mg po daily  rec write script for 5 mg tablets, dose = 1.5 tablets daily ( I have ordered it with 5 mg tablets so this can be re-ordered on discharge) Per Dr. Tanna Furry note he has an appt for f/u visit on Friday for f/u INR and ECG.  Eudelia Bunch, Pharm.D. [phone] 01/05/2014 11:08 AM

## 2014-01-05 NOTE — Progress Notes (Addendum)
Patient ID: Matthew [name], male   DOB: 1976-06-14, 38 y.o.   MRN: [id number]   Patient Name: Matthew [name] Date of Encounter: 01/05/2014     Active Problems:   HYPERLIPIDEMIA-MIXED   HYPERTENSION, BENIGN   Complete transposition of great vessels   Nausea vomiting and diarrhea   Atrial flutter   Hypokalemia   Hypomagnesemia   Alcohol abuse   Alcohol withdrawal   Dehydration with hyponatremia   Depression   Hyperglycemia   Melena   Elevated transaminase level   Metabolic acidosis   Protein-calorie malnutrition, severe   Pulmonary hypertension   Transposition of great vessels   Chronic systolic CHF (congestive heart failure)   Hallucination    SUBJECTIVE No chest pain or sob. Denies hallucinations.  CURRENT MEDS . busPIRone  15 mg Oral BID  . diltiazem  180 mg Oral Q12H  . feeding supplement (ENSURE COMPLETE)  237 mL Oral BID BM  . FLUoxetine  20 mg Oral Daily  . folic acid  1 mg Oral Daily  . metoprolol tartrate  25 mg Oral BID  . multivitamin with minerals  1 tablet Oral Daily  . pantoprazole  40 mg Oral BID  . QUEtiapine  50 mg Oral QHS  . thiamine  100 mg Oral Daily  . Warfarin - Pharmacist Dosing Inpatient   Does not apply q1800    OBJECTIVE  Filed Vitals:   01/04/14 1949 01/04/14 1950 01/04/14 2211 01/05/14 0539  BP: 114/81 114/81 109/72 101/63  Pulse: 128 126  102  Temp: 98.3 F (36.8 C)   98.3 F (36.8 C)  TempSrc: Oral   Oral  Resp:    18  Height:      Weight:    196 lb 6.4 oz (89.086 kg)  SpO2: 96% 97%  96%    Intake/Output Summary (Last 24 hours) at 01/05/14 0853 Last data filed at 01/04/14 1200  Gross per 24 hour  Intake    240 ml  Output      0 ml  Net    240 ml   Filed Weights   01/03/14 0500 01/04/14 0442 01/05/14 0539  Weight: 199 lb 4.7 oz (90.4 kg) 195 lb 12.3 oz (88.8 kg) 196 lb 6.4 oz (89.086 kg)    PHYSICAL EXAM  General: Pleasant, NAD. He has minimal tremulousness Neuro: Alert and oriented X 3. Moves all  extremities spontaneously. Psych: Normal affect. Clear.  HEENT:  Normal  Neck: Supple without bruits or JVD. Lungs:  Resp regular and unlabored, CTA. Heart: RRR no s3, s4, or murmurs. Abdomen: Soft, non-tender, non-distended, BS + x 4.  Extremities: No clubbing, cyanosis or edema. DP/PT/Radials 2+ and equal bilaterally.  Accessory Clinical Findings  CBC  Recent Labs  01/03/14 0220 01/04/14 0305  WBC 6.7 7.9  HGB 13.5 14.0  HCT 39.4 40.5  MCV 98.5 98.3  PLT 238 150   Basic Metabolic Panel  Recent Labs  01/04/14 0305 01/05/14 0325  NA 137 138  K 4.3 4.2  CL 98 98  CO2 22 24  GLUCOSE 101* 116*  BUN 5* 10  CREATININE 0.74 0.97  CALCIUM 9.6 9.3  MG 1.8 1.9   Liver Function Tests  Recent Labs  01/04/14 0305 01/05/14 0325  AST 68* 49*  ALT 93* 84*  ALKPHOS 59 63  BILITOT 0.2* <0.2*  PROT 6.4 6.1  ALBUMIN 3.2* 3.0*   No results found for this basename: LIPASE, AMYLASE,  in the last 72 hours Cardiac Enzymes No results  found for this basename: CKTOTAL, CKMB, CKMBINDEX, TROPONINI,  in the last 72 hours BNP No components found with this basename: POCBNP,  D-Dimer No results found for this basename: DDIMER,  in the last 72 hours Hemoglobin A1C No results found for this basename: HGBA1C,  in the last 72 hours Fasting Lipid Panel No results found for this basename: CHOL, HDL, LDLCALC, TRIG, CHOLHDL, LDLDIRECT,  in the last 72 hours Thyroid Function Tests No results found for this basename: TSH, T4TOTAL, FREET3, T3FREE, THYROIDAB,  in the last 72 hours  TELE  Atrial flutter with a CVR/RVR  Radiology/Studies  US Abdomen Complete  12/30/2013   CLINICAL DATA:  Elevated liver function tests.  EXAM: ULTRASOUND ABDOMEN COMPLETE  COMPARISON:  None.  FINDINGS: Gallbladder:  No gallstones or wall thickening visualized. No sonographic Murphy sign noted.  Common bile duct:  Diameter: 3.4 mm  Liver:  Echogenic with decreased through transmission of the sound beam. No  liver mass or focal lesion. Normal hepatopetal flow in the portal vein.  IVC:  No abnormality visualized.  Pancreas:  Visualized portion unremarkable.  Spleen:  Size and appearance within normal limits.  Right Kidney:  Length: 12 cm. Echogenicity within normal limits. No mass or hydronephrosis visualized.  Left Kidney:  Length: 12.3 cm. Echogenicity within normal limits. No mass or hydronephrosis visualized.  Abdominal aorta:  No aneurysm visualized.  Other findings:  None.  IMPRESSION: 1. No acute findings. 2. Extensive hepatic steatosis.   Electronically Signed   By: Lajean Manes M.D.   On: 12/30/2013 19:41   Dg Chest Portable 1 View  12/29/2013   CLINICAL DATA:  Generalized weakness. Nausea, vomiting, diarrhea. History of transposition of the great vessels.  EXAM: PORTABLE CHEST - 1 VIEW  COMPARISON:  10/01/2012.  FINDINGS: Cardiopericardial silhouette is within normal for size. Median sternotomy. 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 There is no airspace disease. No pleural effusion. Monitoring leads project over the chest.  IMPRESSION: No active cardiopulmonary disease.   Electronically Signed   By: Dereck Ligas M.D.   On: 12/29/2013 19:02    ASSESSMENT AND PLAN 1.Atrial flutter with an RVR 2. Congenital heart disease with repaired transposition 3. ETOH abuse 4. DT's with agitation and hallucinations, now resolved 5. Newly initiated coumadin therapy Rec: The patient's hallucinations have resolved according to nurse caring for him. He also denies. His INR is therapeutic. His rate is at times controlled and at other times not. He needs more metoprolol. I will increase to 50 mg twice daily. He will go to our office on Friday for PT/INR and an ecg. He is going to the beach with his extended family on Saturday. I think this will be a safe place as mother, aunts/uncles cousins and children all together in one house. I will arrange followup. From my  perspective he is acceptable for discharge on metoprolol 50 twice a day, cardizem 180 twice a day, and coumadin along with other meds for anxiety and ETOH WD.   Gregg Taylor,M.D.  01/05/2014 8:53 AM

## 2014-01-05 NOTE — Progress Notes (Signed)
DC pt per MD orders; DC instructions reviewed with patient and family at bedside; prescriptions given to pt; no further questions.  Carollee Sires, RN

## 2014-01-07 ENCOUNTER — Ambulatory Visit (INDEPENDENT_AMBULATORY_CARE_PROVIDER_SITE_OTHER): Payer: BC Managed Care – PPO | Admitting: Pharmacist

## 2014-01-07 DIAGNOSIS — Z5181 Encounter for therapeutic drug level monitoring: Secondary | ICD-10-CM | POA: Insufficient documentation

## 2014-01-07 LAB — POCT INR: INR: 4.8

## 2014-01-07 NOTE — Patient Instructions (Signed)

## 2014-01-10 LAB — PROTIME-INR: INR: 1.7 — AB (ref 0.9–1.1)

## 2014-01-12 ENCOUNTER — Telehealth: Payer: Self-pay | Admitting: Internal Medicine

## 2014-01-12 ENCOUNTER — Ambulatory Visit (INDEPENDENT_AMBULATORY_CARE_PROVIDER_SITE_OTHER): Payer: BC Managed Care – PPO | Admitting: Internal Medicine

## 2014-01-12 DIAGNOSIS — Z5181 Encounter for therapeutic drug level monitoring: Secondary | ICD-10-CM

## 2014-01-12 NOTE — Telephone Encounter (Signed)
Follow up     Patient calling C/O ankle are swollen.

## 2014-01-12 NOTE — Telephone Encounter (Signed)
Returned call to patient he stated he is on vacation.Stated his ankles are swollen this morning.Stated he was out in the sun most of the day yesterday.No sob.Advised to stay out of sun today.Advised to decrease salt intake and elevated feet.Message sent to Dr.Ross for advice.

## 2014-01-12 NOTE — Telephone Encounter (Signed)
Spoke with pt, he went to an urgent care at [facility] for protime. There is a protime in his chart. The number to the [facility] he went to is [phone], if we need to confirm. Will forward to the [facility] clinic

## 2014-01-12 NOTE — Telephone Encounter (Signed)
°  Patient wants results of lab work, please call and advise.

## 2014-01-17 ENCOUNTER — Ambulatory Visit (INDEPENDENT_AMBULATORY_CARE_PROVIDER_SITE_OTHER): Payer: BC Managed Care – PPO | Admitting: *Deleted

## 2014-01-17 ENCOUNTER — Encounter: Payer: Self-pay | Admitting: Physician Assistant

## 2014-01-17 ENCOUNTER — Ambulatory Visit (INDEPENDENT_AMBULATORY_CARE_PROVIDER_SITE_OTHER): Payer: BC Managed Care – PPO | Admitting: Physician Assistant

## 2014-01-17 VITALS — BP 124/70 | HR 113 | Ht 75.0 in | Wt 215.0 lb

## 2014-01-17 DIAGNOSIS — I10 Essential (primary) hypertension: Secondary | ICD-10-CM

## 2014-01-17 DIAGNOSIS — R791 Abnormal coagulation profile: Secondary | ICD-10-CM

## 2014-01-17 DIAGNOSIS — I509 Heart failure, unspecified: Secondary | ICD-10-CM

## 2014-01-17 DIAGNOSIS — Z5181 Encounter for therapeutic drug level monitoring: Secondary | ICD-10-CM

## 2014-01-17 DIAGNOSIS — I4892 Unspecified atrial flutter: Secondary | ICD-10-CM

## 2014-01-17 DIAGNOSIS — F1010 Alcohol abuse, uncomplicated: Secondary | ICD-10-CM

## 2014-01-17 DIAGNOSIS — R0602 Shortness of breath: Secondary | ICD-10-CM

## 2014-01-17 DIAGNOSIS — I5022 Chronic systolic (congestive) heart failure: Secondary | ICD-10-CM

## 2014-01-17 LAB — CBC WITH DIFFERENTIAL/PLATELET
Basophils Absolute: 0 10*3/uL (ref 0.0–0.1)
Basophils Relative: 0.5 % (ref 0.0–3.0)
Eosinophils Absolute: 0.2 10*3/uL (ref 0.0–0.7)
Eosinophils Relative: 1.8 % (ref 0.0–5.0)
HCT: 40.8 % (ref 39.0–52.0)
Hemoglobin: 13.3 g/dL (ref 13.0–17.0)
Lymphocytes Relative: 9 % — ABNORMAL LOW (ref 12.0–46.0)
Lymphs Abs: 0.8 10*3/uL (ref 0.7–4.0)
MCHC: 32.5 g/dL (ref 30.0–36.0)
MCV: 101.2 fl — ABNORMAL HIGH (ref 78.0–100.0)
Monocytes Absolute: 0.8 10*3/uL (ref 0.1–1.0)
Monocytes Relative: 8.6 % (ref 3.0–12.0)
Neutro Abs: 7.2 10*3/uL (ref 1.4–7.7)
Neutrophils Relative %: 80.1 % — ABNORMAL HIGH (ref 43.0–77.0)
Platelets: 374 10*3/uL (ref 150.0–400.0)
RBC: 4.03 Mil/uL — ABNORMAL LOW (ref 4.22–5.81)
RDW: 15.4 % (ref 11.5–15.5)
WBC: 9 10*3/uL (ref 4.0–10.5)

## 2014-01-17 LAB — BASIC METABOLIC PANEL
BUN: 7 mg/dL (ref 6–23)
CO2: 25 mEq/L (ref 19–32)
Calcium: 9.1 mg/dL (ref 8.4–10.5)
Chloride: 107 mEq/L (ref 96–112)
Creatinine, Ser: 1 mg/dL (ref 0.4–1.5)
GFR: 94.32 mL/min (ref >60.00)
Glucose, Bld: 93 mg/dL (ref 70–99)
Potassium: 3.6 mEq/L (ref 3.5–5.1)
Sodium: 141 mEq/L (ref 135–145)

## 2014-01-17 LAB — PROTIME-INR
INR: 9.8 ratio (ref 0.8–1.0)
Prothrombin Time: 101.9 s (ref 9.6–13.1)

## 2014-01-17 LAB — POCT INR: INR: 8

## 2014-01-17 MED ORDER — POTASSIUM CHLORIDE CRYS ER 20 MEQ PO TBCR: 20.0000 meq | EXTENDED_RELEASE_TABLET | Freq: Every day | ORAL | Status: DC

## 2014-01-17 MED ORDER — METOPROLOL TARTRATE 100 MG PO TABS: 100.0000 mg | ORAL_TABLET | Freq: Two times a day (BID) | ORAL | Status: DC

## 2014-01-17 MED ORDER — FUROSEMIDE 20 MG PO TABS: 20.0000 mg | ORAL_TABLET | Freq: Every day | ORAL | Status: DC

## 2014-01-17 NOTE — Progress Notes (Signed)
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 He was admitted with atypical atrial flutter which was difficult to control as well as alcohol abuse and hallucinations and DTs due to alcohol withdrawal. He was followed by Dr. Crissie Sickles in the hospital who arranged for him to go to the [facility] for catheter ablation as an outpatient. He is being maintained on metoprolol 50 mg twice a day and diltiazem 180 mg twice a day for rate control. He was also started on Coumadin with counseling about not drinking alcohol while on Coumadin.   The patient went to the beach last week and says his heart rate stayed in the 115 range. His ankles were swollen and he is complaining of a cough. His INR is up to 8. He says he only drank 2 beers and had one shot of whiskey after being clean for 14 days.  Allergies -- Aspirin   Current Outpatient Prescriptions on File Prior to Visit: ALPRAZolam (XANAX) 0.5 MG tablet, Take 1 tablet (0.5 mg total) by mouth 3 (three) times daily as needed for anxiety., Disp: 15 tablet, Rfl: 0 busPIRone (BUSPAR) 15 MG tablet, Take 15 mg by mouth 2 (two) times daily., Disp: , Rfl:  diltiazem (CARDIZEM SR) 90 MG 12 hr capsule, Take 2 capsules (180 mg total) by mouth every 12 (twelve) hours., Disp: 60 capsule, Rfl: 0 feeding supplement, ENSURE COMPLETE, (ENSURE COMPLETE) LIQD, Take 237 mLs by mouth 2 (two) times daily between meals., Disp: , Rfl:  FLUoxetine (PROZAC) 20 MG capsule, Take 20 mg by mouth daily., Disp: , Rfl:   folic acid (FOLVITE) 1 MG tablet, Take 1 tablet (1 mg total) by mouth daily., Disp: , Rfl:  losartan (COZAAR) 50 MG tablet, , Disp: , Rfl:  metoprolol (LOPRESSOR) 50 MG tablet, Take 1 tablet (50 mg total) by mouth 2 (two) times daily., Disp: 60 tablet, Rfl: 0 pantoprazole (PROTONIX) 40 MG tablet, Take 1 tablet (40 mg total) by mouth 2 (two) times daily., Disp: 60 tablet, Rfl: 0 QUEtiapine  (SEROQUEL XR) 50 MG TB24 24 hr tablet, Take 1 tablet (50 mg total) by mouth at bedtime., Disp: 30 each, Rfl: 0 warfarin (COUMADIN) 7.5 MG tablet, Take 1 tablet (7.5 mg total) by mouth daily at 6 PM., Disp: 30 tablet, Rfl: 0  No current facility-administered medications on file prior to visit.   Past Medical History:   Complete transposition of great vessels                      Hypertension                                                 Dyslipidemia                                                 Depression                                                   Alcohol abuse  [place] spotted fever                    11/2013      Past Surgical History:   volar plate arthroplasty, right small finger p*               SEPTOSTOMY                                                      Comment:rashkind   mustard procedure                                            Review of patient's family history indicates:   Rheumatic fever                Father                   Hypertension                   Father                   Social History   Marital Status: Married             Spouse Name:                      Years of Education:                 Number of children:             Occupational History   None on file  Social History Main Topics   Smoking Status: Never Smoker                     Smokeless Status: Not on file                      Alcohol Use: Yes               Comment: daily   Drug Use: No             Sexual Activity: Not on file        Other Topics            Concern   None on file  Social History Narrative   None on file    ROS: See history of present illness otherwise negative   PHYSICAL EXAM: Well-nournished, in no acute distress. Neck: No JVD, HJR, Bruit, or thyroid enlargement  Lungs: Decreased breath sounds with crackles at bilateral bases   Cardiovascular: RRR at 114 beats per minute, 2/6 systolic murmur at  the left sternal border, no gallops, bruit, thrill, or heave.  Abdomen: BS normal. Soft without organomegaly, masses, lesions or tenderness.  Extremities: Trace of ankle edema otherwise lower extremities without cyanosis, clubbing. Good distal pulses bilateral  SKin: Warm, no lesions or rashes   Musculoskeletal: No deformities  Neuro: no focal signs  BP 124/70  Pulse 113  Ht 6\' 3"  (1.905 m)  Wt 215 lb (97.523 kg)  BMI 26.87 kg/m2   EKG: Sinus tachycardia(probably atrial flutter) at 114 beats per minute with incomplete right bundle branch  block nonspecific ST-T wave abnormality   2-D echo Impressions:  - Patient with D transposition of the great vessels s/p Mustard   procedure (atrial switch). 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 Moderate RAE. Moderate RVE (systemic ventricle) with   severely reduced function; moderate regurgitation throught the   systemic AV valve (TR); mild AI. Morphologic LV shows septal   hypokinesis with mildly reduced function (EF 45-50); mild MR.   Compared to 04/12/11, systemic ventricular function is worse.

## 2014-01-17 NOTE — Assessment & Plan Note (Signed)
Patient has evidence of heart failure and exam today. We'll begin low-dose Lasix 20 mg once daily potassium 20 mEq once daily. I suspect this is due to to his uncontrolled atrial flutter I discussed this patient in detail with Dr. Lovena Le who concurs. We will increase his metoprolol to 100 mg twice a day for better rate control

## 2014-01-17 NOTE — Assessment & Plan Note (Signed)
Patient was clean of alcohol for 14 days but resumed drinking last week while at the beach. Had along discussion with the patient concerning alcohol and Coumadin. His INR is at today. We are checking a lab stick to verify. Coumadin 2 monitor patient closely.

## 2014-01-17 NOTE — Assessment & Plan Note (Signed)
Patient has atypical atrial flutter that is difficult to control because of his transposition of great vessels surgery. Dr. Lovena Le range for him to be seen at [facility] for further evaluation and treatment with possible ablation. The patient was started on Coumadin but his INR is 8.0 today. We will check a laboratory test. He has been counseled extensively to not drink alcohol on Coumadin. He says he only had 2 beers and one shot of whiskey at the beach last week. He is to hold his Coumadin until further instructed by Coumadin clinic.

## 2014-01-17 NOTE — Patient Instructions (Signed)
Your physician recommends that you return for lab work in: TODAY (CBC, BMET, STAT PT-INR, BNP)  Your physician has recommended you make the following change in your medication:   INCREASE METOPROLOL 50 MG (2 TABLETS TWICE A DAY)  START LASIX 20 MG ONCE A DAY  START POTASSIUM 20 MEQ ONCE A DAY  YOUR PROVIDER WOULD LIKE FOR YOU TO STOP DRINKING ALCOHOL  Alcohol and Nutrition Nutrition serves two purposes. It provides energy. It also maintains body structure and function. Food supplies energy. It also provides the building blocks needed to replace worn or damaged cells. Alcoholics often eat poorly. This limits their supply of essential nutrients. This affects energy supply and structure maintenance. Alcohol also affects the body's nutrients in:  Digestion.  Storage.  Using and getting rid of waste products. IMPAIRMENT OF NUTRIENT DIGESTION AND UTILIZATION   Once ingested, food must be broken down into small components (digested). Then it is available for energy. It helps maintain body structure and function. Digestion begins in the mouth. It continues in the stomach and intestines, with help from the pancreas. The nutrients from digested food are absorbed from the intestines into the blood. Then they are carried to the liver. The liver prepares nutrients for:  Immediate use.  Storage and future use.  Alcohol inhibits the breakdown of nutrients into usable molecules.  It decreases secretion of digestive enzymes from the pancreas.  Alcohol impairs nutrient absorption by damaging the cells lining the stomach and intestines.  It also interferes with moving some nutrients into the blood.  In addition, nutritional deficiencies themselves may lead to further absorption problems.  For example, folate deficiency changes the cells that line the small intestine. This impairs how water is absorbed. It also affects absorbed nutrients. These include glucose, sodium, and additional  folate.  Even if nutrients are digested and absorbed, alcohol can prevent them from being fully used. It changes their transport, storage, and excretion. Impaired utilization of nutrients by alcoholics is indicated by:  Decreased liver stores of vitamins, such as vitamin A.  Increased excretion of nutrients such as fat. ALCOHOL AND ENERGY SUPPLY   Three basic nutritional components found in food are:  Carbohydrates.  Proteins.  Fats.  These are used as energy. Some alcoholics take in as much as 50% of their total daily calories from alcohol. They often neglect important foods.  Even when enough food is eaten, alcohol can impair the ways the body controls blood sugar (glucose) levels. It may either increase or decrease blood sugar.  In non-diabetic alcoholics, increased blood sugar (hyperglycemia) is caused by poor insulin secretion. It is usually temporary.  Decreased blood sugar (hypoglycemia) can cause serious injury even if this condition is short-lived. Low blood sugar can happen when a fasting or malnourished person drinks alcohol. When there is no food to supply energy, stored sugar is used up. The products of alcohol inhibit forming glucose from other compounds such as amino acids. As a result, alcohol causes the brain and other body tissue to lack glucose. It is needed for energy and function.  Alcohol is an energy source. But how the body processes and uses the energy from alcohol is complex. Also, when alcohol is substituted for carbohydrates, subjects tend to lose weight. This indicates that they get less energy from alcohol than from food. ALCOHOL - MAINTAINING CELL STRUCTURE AND FUNCTION  Structure Cells are made mostly of protein. So an adequate protein diet is important for maintaining cell structure. This is especially true if  cells are being damaged. Research indicates that alcohol affects protein nutrition by causing impaired:  Digestion of proteins to amino  acids.  Processing of amino acids by the small intestine and liver.  Synthesis of proteins from amino acids.  Protein secretion by the liver. Function Nutrients are essential for the body to function well. They provide the tools that the body needs to work well:   Proteins.  Vitamins.  Minerals. Alcohol can disrupt body function. It may cause nutrient deficiencies. And it may interfere with the way nutrients are processed. Vitamins  Vitamins are essential to maintain growth and normal metabolism. They regulate many of the body`s processes. Chronic heavy drinking causes deficiencies in many vitamins. This is caused by eating less. And, in some cases, vitamins may be poorly absorbed. For example, alcohol inhibits fat absorption. It impairs how the vitamins A, E, and D are normally absorbed along with dietary fats. Not enough vitamin A may cause night blindness. Not enough vitamin D may cause softening of the bones.  Some alcoholics lack vitamins A, C, D, E, K, and the B vitamins. These are all involved in wound healing and cell maintenance. In particular, because vitamin K is necessary for blood clotting, lacking that vitamin can cause delayed clotting. The result is excess bleeding. Lacking other vitamins involved in brain function may cause severe neurological damage. Minerals Deficiencies of minerals such as calcium, magnesium, iron, and zinc are common in alcoholics. The alcohol itself does not seem to affect how these minerals are absorbed. Rather, they seem to occur secondary to other alcohol-related problems, such as:  Less calcium absorbed.  Not enough magnesium.  More urinary excretion.  Vomiting.  Diarrhea.  Not enough iron due to gastrointestinal bleeding.  Not enough zinc or losses related to other nutrient deficiencies.  Mineral deficiencies can cause a variety of medical consequences. These range from calcium-related bone disease to zinc-related night blindness and  skin lesions. ALCOHOL, MALNUTRITION, AND MEDICAL COMPLICATIONS  Liver Disease   Alcoholic liver damage is caused primarily by alcohol itself. But poor nutrition may increase the risk of alcohol-related liver damage. For example, nutrients normally found in the liver are known to be affected by drinking alcohol. These include carotenoids, which are the major sources of vitamin A, and vitamin E compounds. Decreases in such nutrients may play some role in alcohol-related liver damage. Pancreatitis  Research suggests that malnutrition may increase the risk of developing alcoholic pancreatitis. Research suggests that a diet lacking in protein may increase alcohol's damaging effect on the pancreas. Brain  Nutritional deficiencies may have severe effects on brain function. These may be permanent. Specifically, thiamine deficiencies are often seen in alcoholics. They can cause severe neurological problems. These include:  Impaired movement.  Memory loss seen in Wernicke-Korsakoff syndrome. Pregnancy  Alcohol has toxic effects on fetal development. It causes alcohol-related birth defects. They include fetal alcohol syndrome. Alcohol itself is toxic to the fetus. Also, the nutritional deficiency can affect how the fetus develops. That may compound the risk of developmental damage.  Nutritional needs during pregnancy are 10% to 30% greater than normal. Food intake can increase by as much as 140% to cover the needs of both mother and fetus. An alcoholic mother`s nutritional problems may adversely affect the nutrition of the fetus. And alcohol itself can also restrict nutrition flow to the fetus. NUTRITIONAL STATUS OF ALCOHOLICS  Techniques for assessing nutritional status include:  Taking body measurements to estimate fat reserves. They include:  Weight.  Height.  Mass.  Skin fold thickness.  Performing blood analysis to provide measurements of  circulating:  Proteins.  Vitamins.  Minerals.  These techniques tend to be imprecise. For many nutrients, there is no clear "cut-off" point that would allow an accurate definition of deficiency. So assessing the nutritional status of alcoholics is limited by these techniques. Dietary status may provide information about the risk of developing nutritional problems. Dietary status is assessed by:  Taking patients' dietary histories.  Evaluating the amount and types of food they are eating.  It is difficult to determine what exact amount of alcohol begins to have damaging effects on nutrition. In general, moderate drinkers have 2 drinks or less per day. They seem to be at little risk for nutritional problems. Various medical disorders begin to appear at greater levels.  Research indicates that the majority of even the heaviest drinkers have few obvious nutritional deficiencies. Many alcoholics who are hospitalized for medical complications of their disease do have severe malnutrition. Alcoholics tend to eat poorly. Often they eat less than the amounts of food necessary to provide enough:  Carbohydrates.  Protein.  Fat.  Vitamins A and C.  B vitamins.  Minerals like calcium and iron. Of major concern is alcohol's effect on digesting food and use of nutrients. It may shift a mildly malnourished person toward severe malnutrition. Document Released: 04/25/2005 Document Revised: 09/23/2011 Document Reviewed: 10/09/2005 [facility] Patient Information 2015 [place], [place]. This information is not intended to replace advice given to you by your health care provider. Make sure you discuss any questions you have with your health care provider.

## 2014-01-19 ENCOUNTER — Ambulatory Visit: Payer: BC Managed Care – PPO | Admitting: Cardiology

## 2014-01-20 ENCOUNTER — Ambulatory Visit (INDEPENDENT_AMBULATORY_CARE_PROVIDER_SITE_OTHER): Payer: BC Managed Care – PPO | Admitting: Pharmacist

## 2014-01-20 ENCOUNTER — Other Ambulatory Visit: Payer: Self-pay | Admitting: *Deleted

## 2014-01-20 DIAGNOSIS — Z5181 Encounter for therapeutic drug level monitoring: Secondary | ICD-10-CM

## 2014-01-20 LAB — POCT INR: INR: 2.1

## 2014-01-20 MED ORDER — DILTIAZEM HCL ER 90 MG PO CP12: 180.0000 mg | ORAL_CAPSULE | Freq: Two times a day (BID) | ORAL | Status: DC

## 2014-01-21 ENCOUNTER — Telehealth: Payer: Self-pay | Admitting: Internal Medicine

## 2014-02-03 DIAGNOSIS — I4891 Unspecified atrial fibrillation: Secondary | ICD-10-CM | POA: Insufficient documentation

## 2014-02-04 DIAGNOSIS — Z8774 Personal history of (corrected) congenital malformations of heart and circulatory system: Secondary | ICD-10-CM | POA: Insufficient documentation

## 2014-02-16 ENCOUNTER — Encounter: Payer: Self-pay | Admitting: Internal Medicine

## 2014-03-04 ENCOUNTER — Ambulatory Visit: Payer: BC Managed Care – PPO | Admitting: Internal Medicine

## 2014-03-17 NOTE — Telephone Encounter (Signed)
error 

## 2014-04-18 ENCOUNTER — Other Ambulatory Visit: Payer: Self-pay | Admitting: Internal Medicine

## 2014-04-21 ENCOUNTER — Encounter (HOSPITAL_COMMUNITY): Payer: Self-pay | Admitting: Emergency Medicine

## 2014-04-21 ENCOUNTER — Inpatient Hospital Stay (HOSPITAL_COMMUNITY)
Admission: EM | Admit: 2014-04-21 | Discharge: 2014-04-23 | DRG: 308 | Disposition: A | Discharge status: Home / Self Care | Payer: BC Managed Care – PPO | Attending: Internal Medicine | Admitting: Internal Medicine

## 2014-04-21 ENCOUNTER — Emergency Department (HOSPITAL_COMMUNITY): Payer: BC Managed Care – PPO

## 2014-04-21 DIAGNOSIS — I959 Hypotension, unspecified: Secondary | ICD-10-CM | POA: Diagnosis present

## 2014-04-21 DIAGNOSIS — E785 Hyperlipidemia, unspecified: Secondary | ICD-10-CM

## 2014-04-21 DIAGNOSIS — R0902 Hypoxemia: Secondary | ICD-10-CM | POA: Diagnosis present

## 2014-04-21 DIAGNOSIS — I272 Other secondary pulmonary hypertension: Secondary | ICD-10-CM | POA: Diagnosis present

## 2014-04-21 DIAGNOSIS — E876 Hypokalemia: Secondary | ICD-10-CM | POA: Diagnosis present

## 2014-04-21 DIAGNOSIS — I484 Atypical atrial flutter: Secondary | ICD-10-CM

## 2014-04-21 DIAGNOSIS — I4891 Unspecified atrial fibrillation: Secondary | ICD-10-CM | POA: Diagnosis present

## 2014-04-21 DIAGNOSIS — F10230 Alcohol dependence with withdrawal, uncomplicated: Secondary | ICD-10-CM

## 2014-04-21 DIAGNOSIS — I4892 Unspecified atrial flutter: Secondary | ICD-10-CM | POA: Diagnosis present

## 2014-04-21 DIAGNOSIS — R791 Abnormal coagulation profile: Secondary | ICD-10-CM

## 2014-04-21 DIAGNOSIS — R002 Palpitations: Secondary | ICD-10-CM | POA: Diagnosis not present

## 2014-04-21 DIAGNOSIS — Z7901 Long term (current) use of anticoagulants: Secondary | ICD-10-CM

## 2014-04-21 DIAGNOSIS — R000 Tachycardia, unspecified: Secondary | ICD-10-CM

## 2014-04-21 DIAGNOSIS — Y900 Blood alcohol level of less than 20 mg/100 ml: Secondary | ICD-10-CM | POA: Diagnosis present

## 2014-04-21 DIAGNOSIS — I10 Essential (primary) hypertension: Secondary | ICD-10-CM | POA: Diagnosis present

## 2014-04-21 DIAGNOSIS — I483 Typical atrial flutter: Secondary | ICD-10-CM

## 2014-04-21 DIAGNOSIS — I5023 Acute on chronic systolic (congestive) heart failure: Secondary | ICD-10-CM

## 2014-04-21 DIAGNOSIS — F1010 Alcohol abuse, uncomplicated: Secondary | ICD-10-CM | POA: Diagnosis present

## 2014-04-21 DIAGNOSIS — Q203 Discordant ventriculoarterial connection: Secondary | ICD-10-CM

## 2014-04-21 DIAGNOSIS — I9589 Other hypotension: Secondary | ICD-10-CM

## 2014-04-21 DIAGNOSIS — D72829 Elevated white blood cell count, unspecified: Secondary | ICD-10-CM | POA: Diagnosis present

## 2014-04-21 DIAGNOSIS — Z8249 Family history of ischemic heart disease and other diseases of the circulatory system: Secondary | ICD-10-CM | POA: Diagnosis not present

## 2014-04-21 DIAGNOSIS — I5022 Chronic systolic (congestive) heart failure: Secondary | ICD-10-CM | POA: Diagnosis present

## 2014-04-21 DIAGNOSIS — F10930 Alcohol use, unspecified with withdrawal, uncomplicated: Secondary | ICD-10-CM

## 2014-04-21 DIAGNOSIS — Z9114 Patient's other noncompliance with medication regimen: Secondary | ICD-10-CM | POA: Diagnosis present

## 2014-04-21 LAB — BASIC METABOLIC PANEL
BUN: 8 mg/dL (ref 6–23)
CO2: 24 mEq/L (ref 19–32)
Calcium: 9.2 mg/dL (ref 8.4–10.5)
Chloride: 97 mEq/L (ref 96–112)
Creatinine, Ser: 0.83 mg/dL (ref 0.50–1.35)
GFR calc Af Amer: >90 mL/min (ref >90)
GFR calc non Af Amer: >90 mL/min (ref >90)
Glucose, Bld: 141 mg/dL — ABNORMAL HIGH (ref 70–99)
Potassium: 3.1 mEq/L — ABNORMAL LOW (ref 3.7–5.3)
Sodium: 142 mEq/L (ref 137–147)

## 2014-04-21 LAB — URINALYSIS, ROUTINE W REFLEX MICROSCOPIC
Bilirubin Urine: NEGATIVE
Glucose, UA: NEGATIVE mg/dL
Hgb urine dipstick: NEGATIVE
Ketones, ur: NEGATIVE mg/dL
Leukocytes, UA: NEGATIVE
Nitrite: NEGATIVE
Protein, ur: 30 mg/dL — AB
Specific Gravity, Urine: 1.01 (ref 1.005–1.030)
Urobilinogen, UA: 1 mg/dL (ref 0.0–1.0)
pH: 7.5 (ref 5.0–8.0)

## 2014-04-21 LAB — CBC WITH DIFFERENTIAL/PLATELET
Basophils Absolute: 0 10*3/uL (ref 0.0–0.1)
Basophils Relative: 0 % (ref 0–1)
Eosinophils Absolute: 0 10*3/uL (ref 0.0–0.7)
Eosinophils Relative: 0 % (ref 0–5)
HCT: 51.6 % (ref 39.0–52.0)
Hemoglobin: 18 g/dL — ABNORMAL HIGH (ref 13.0–17.0)
Lymphocytes Relative: 13 % (ref 12–46)
Lymphs Abs: 1.8 10*3/uL (ref 0.7–4.0)
MCH: 33.1 pg (ref 26.0–34.0)
MCHC: 34.9 g/dL (ref 30.0–36.0)
MCV: 94.9 fL (ref 78.0–100.0)
Monocytes Absolute: 1.8 10*3/uL — ABNORMAL HIGH (ref 0.1–1.0)
Monocytes Relative: 13 % — ABNORMAL HIGH (ref 3–12)
Neutro Abs: 10.5 10*3/uL — ABNORMAL HIGH (ref 1.7–7.7)
Neutrophils Relative %: 74 % (ref 43–77)
Platelets: 226 10*3/uL (ref 150–400)
RBC: 5.44 MIL/uL (ref 4.22–5.81)
RDW: 14.7 % (ref 11.5–15.5)
WBC: 14.6 10*3/uL — ABNORMAL HIGH (ref 4.0–10.5)

## 2014-04-21 LAB — URINE MICROSCOPIC-ADD ON

## 2014-04-21 LAB — PROTIME-INR
INR: 1 (ref 0.00–1.49)
Prothrombin Time: 13.2 seconds (ref 11.6–15.2)

## 2014-04-21 LAB — TROPONIN I: Troponin I: <0.3 ng/mL (ref <0.30)

## 2014-04-21 LAB — MAGNESIUM: Magnesium: 1.2 mg/dL — ABNORMAL LOW (ref 1.5–2.5)

## 2014-04-21 LAB — MRSA PCR SCREENING: MRSA by PCR: POSITIVE — AB

## 2014-04-21 LAB — ETHANOL: Alcohol, Ethyl (B): <11 mg/dL (ref 0–11)

## 2014-04-21 MED ORDER — LOSARTAN POTASSIUM 50 MG PO TABS
50.0000 mg | ORAL_TABLET | Freq: Every day | ORAL | Status: DC
  Administered 2014-04-21 – 2014-04-23 (×3): 50 mg via ORAL
  Filled 2014-04-21 (×3): qty 1

## 2014-04-21 MED ORDER — ENOXAPARIN SODIUM 40 MG/0.4ML ~~LOC~~ SOLN: 40.0000 mg | SUBCUTANEOUS | Status: DC

## 2014-04-21 MED ORDER — SODIUM CHLORIDE 0.9 % IJ SOLN
3.0000 mL | Freq: Two times a day (BID) | INTRAMUSCULAR | Status: DC
  Administered 2014-04-21 – 2014-04-23 (×3): 3 mL via INTRAVENOUS

## 2014-04-21 MED ORDER — POTASSIUM CHLORIDE CRYS ER 20 MEQ PO TBCR
40.0000 meq | EXTENDED_RELEASE_TABLET | ORAL | Status: AC
  Administered 2014-04-21 (×2): 40 meq via ORAL
  Filled 2014-04-21 (×2): qty 2

## 2014-04-21 MED ORDER — POTASSIUM CHLORIDE CRYS ER 20 MEQ PO TBCR
20.0000 meq | EXTENDED_RELEASE_TABLET | Freq: Every day | ORAL | Status: DC
  Administered 2014-04-22 – 2014-04-23 (×2): 20 meq via ORAL
  Filled 2014-04-21 (×2): qty 1

## 2014-04-21 MED ORDER — LORAZEPAM 2 MG/ML IJ SOLN
0.0000 mg | Freq: Two times a day (BID) | INTRAMUSCULAR | Status: DC
  Filled 2014-04-21: qty 1

## 2014-04-21 MED ORDER — DILTIAZEM HCL ER 90 MG PO CP12
180.0000 mg | ORAL_CAPSULE | Freq: Two times a day (BID) | ORAL | Status: DC
  Administered 2014-04-21 – 2014-04-23 (×4): 180 mg via ORAL
  Filled 2014-04-21 (×8): qty 2

## 2014-04-21 MED ORDER — ACETAMINOPHEN 650 MG RE SUPP: 650.0000 mg | Freq: Four times a day (QID) | RECTAL | Status: DC | PRN

## 2014-04-21 MED ORDER — ONDANSETRON HCL 4 MG/2ML IJ SOLN
4.0000 mg | Freq: Four times a day (QID) | INTRAMUSCULAR | Status: DC | PRN
Start: 2014-04-21 — End: 2014-04-23

## 2014-04-21 MED ORDER — SODIUM CHLORIDE 0.9 % IV SOLN
INTRAVENOUS | Status: DC
  Administered 2014-04-21 – 2014-04-22 (×3): via INTRAVENOUS

## 2014-04-21 MED ORDER — THIAMINE HCL 100 MG/ML IJ SOLN
100.0000 mg | Freq: Every day | INTRAMUSCULAR | Status: DC
  Administered 2014-04-21: 100 mg via INTRAVENOUS
  Filled 2014-04-21: qty 2

## 2014-04-21 MED ORDER — VITAMIN B-1 100 MG PO TABS: 100.0000 mg | ORAL_TABLET | Freq: Every day | ORAL | Status: DC

## 2014-04-21 MED ORDER — ACETAMINOPHEN 325 MG PO TABS
650.0000 mg | ORAL_TABLET | Freq: Four times a day (QID) | ORAL | Status: DC | PRN
  Administered 2014-04-21 – 2014-04-23 (×3): 650 mg via ORAL
  Filled 2014-04-21 (×3): qty 2

## 2014-04-21 MED ORDER — METOPROLOL TARTRATE 50 MG PO TABS
100.0000 mg | ORAL_TABLET | Freq: Two times a day (BID) | ORAL | Status: DC
  Administered 2014-04-21 – 2014-04-23 (×4): 100 mg via ORAL
  Filled 2014-04-21 (×5): qty 2

## 2014-04-21 MED ORDER — HYDROCODONE-ACETAMINOPHEN 5-325 MG PO TABS: 1.0000 | ORAL_TABLET | ORAL | Status: DC | PRN

## 2014-04-21 MED ORDER — QUETIAPINE FUMARATE ER 50 MG PO TB24
50.0000 mg | ORAL_TABLET | Freq: Every day | ORAL | Status: DC
  Administered 2014-04-21 – 2014-04-22 (×2): 50 mg via ORAL
  Filled 2014-04-21 (×4): qty 1

## 2014-04-21 MED ORDER — FOLIC ACID 1 MG PO TABS
1.0000 mg | ORAL_TABLET | Freq: Every day | ORAL | Status: DC
  Administered 2014-04-21 – 2014-04-23 (×3): 1 mg via ORAL
  Filled 2014-04-21 (×3): qty 1

## 2014-04-21 MED ORDER — POTASSIUM CHLORIDE 20 MEQ/15ML (10%) PO LIQD
40.0000 meq | Freq: Once | ORAL | Status: AC
  Administered 2014-04-21: 40 meq via ORAL
  Filled 2014-04-21: qty 30

## 2014-04-21 MED ORDER — LORAZEPAM 2 MG/ML IJ SOLN
0.0000 mg | Freq: Four times a day (QID) | INTRAMUSCULAR | Status: DC
  Administered 2014-04-21: 1 mg via INTRAVENOUS

## 2014-04-21 MED ORDER — FUROSEMIDE 20 MG PO TABS
20.0000 mg | ORAL_TABLET | Freq: Every day | ORAL | Status: DC
  Administered 2014-04-21 – 2014-04-23 (×3): 20 mg via ORAL
  Filled 2014-04-21 (×3): qty 1

## 2014-04-21 MED ORDER — LORAZEPAM 2 MG/ML IJ SOLN: 1.0000 mg | Freq: Four times a day (QID) | INTRAMUSCULAR | Status: DC | PRN

## 2014-04-21 MED ORDER — FLUOXETINE HCL 20 MG PO CAPS
20.0000 mg | ORAL_CAPSULE | Freq: Every day | ORAL | Status: DC
  Administered 2014-04-21 – 2014-04-23 (×3): 20 mg via ORAL
  Filled 2014-04-21 (×3): qty 1

## 2014-04-21 MED ORDER — DILTIAZEM HCL 25 MG/5ML IV SOLN
INTRAVENOUS | Status: AC
  Administered 2014-04-21: 12:00:00
  Filled 2014-04-21: qty 5

## 2014-04-21 MED ORDER — ALPRAZOLAM 0.5 MG PO TABS
0.5000 mg | ORAL_TABLET | Freq: Three times a day (TID) | ORAL | Status: DC | PRN
  Administered 2014-04-21 – 2014-04-23 (×4): 0.5 mg via ORAL
  Filled 2014-04-21 (×4): qty 1

## 2014-04-21 MED ORDER — DILTIAZEM HCL 100 MG IV SOLR
5.0000 mg/h | INTRAVENOUS | Status: DC
  Administered 2014-04-21: 10 mg/h via INTRAVENOUS
  Filled 2014-04-21: qty 100

## 2014-04-21 MED ORDER — ADENOSINE 6 MG/2ML IV SOLN
INTRAVENOUS | Status: AC
  Administered 2014-04-21: 6 mg
  Filled 2014-04-21: qty 2

## 2014-04-21 MED ORDER — LORAZEPAM 0.5 MG PO TABS
1.0000 mg | ORAL_TABLET | Freq: Four times a day (QID) | ORAL | Status: DC | PRN
  Filled 2014-04-21: qty 2

## 2014-04-21 MED ORDER — ONDANSETRON HCL 4 MG PO TABS: 4.0000 mg | ORAL_TABLET | Freq: Four times a day (QID) | ORAL | Status: DC | PRN

## 2014-04-21 MED ORDER — ADENOSINE 6 MG/2ML IV SOLN
INTRAVENOUS | Status: AC
  Administered 2014-04-21: 12 mg
  Filled 2014-04-21: qty 4

## 2014-04-21 MED ORDER — THIAMINE HCL 100 MG/ML IJ SOLN
Freq: Once | INTRAVENOUS | Status: AC
  Administered 2014-04-21: 13:00:00 via INTRAVENOUS
  Filled 2014-04-21: qty 1000

## 2014-04-21 MED ORDER — PANTOPRAZOLE SODIUM 40 MG PO TBEC
40.0000 mg | DELAYED_RELEASE_TABLET | Freq: Every day | ORAL | Status: DC
  Administered 2014-04-21 – 2014-04-23 (×3): 40 mg via ORAL
  Filled 2014-04-21 (×3): qty 1

## 2014-04-21 MED ORDER — BUSPIRONE HCL 5 MG PO TABS
15.0000 mg | ORAL_TABLET | Freq: Two times a day (BID) | ORAL | Status: DC
  Administered 2014-04-21 – 2014-04-23 (×5): 15 mg via ORAL
  Filled 2014-04-21 (×5): qty 3

## 2014-04-21 MED ORDER — VITAMIN B-1 100 MG PO TABS
100.0000 mg | ORAL_TABLET | Freq: Every day | ORAL | Status: DC
  Administered 2014-04-21 – 2014-04-23 (×3): 100 mg via ORAL
  Filled 2014-04-21 (×3): qty 1

## 2014-04-21 MED ORDER — WARFARIN - PHARMACIST DOSING INPATIENT: Freq: Every day | Status: DC

## 2014-04-21 MED ORDER — POTASSIUM CHLORIDE CRYS ER 20 MEQ PO TBCR: 40.0000 meq | EXTENDED_RELEASE_TABLET | ORAL | Status: DC

## 2014-04-21 MED ORDER — THIAMINE HCL 100 MG/ML IJ SOLN: 100.0000 mg | Freq: Every day | INTRAMUSCULAR | Status: DC

## 2014-04-21 MED ORDER — ALUM & MAG HYDROXIDE-SIMETH 200-200-20 MG/5ML PO SUSP: 30.0000 mL | Freq: Four times a day (QID) | ORAL | Status: DC | PRN

## 2014-04-21 MED ORDER — WARFARIN SODIUM 10 MG PO TABS
10.0000 mg | ORAL_TABLET | Freq: Once | ORAL | Status: AC
  Administered 2014-04-21: 10 mg via ORAL
  Filled 2014-04-21: qty 1

## 2014-04-21 MED ORDER — ADULT MULTIVITAMIN W/MINERALS CH
1.0000 | ORAL_TABLET | Freq: Every day | ORAL | Status: DC
  Administered 2014-04-21 – 2014-04-23 (×3): 1 via ORAL
  Filled 2014-04-21 (×3): qty 1

## 2014-04-21 MED ORDER — DILTIAZEM HCL 25 MG/5ML IV SOLN
15.0000 mg | Freq: Once | INTRAVENOUS | Status: AC
  Administered 2014-04-21: 15 mg via INTRAVENOUS

## 2014-04-21 NOTE — ED Notes (Addendum)
Notified by triage nurse [place]. That pt's HR was 228. Dr. Marisa Hua notified and to bedside along with myself and ED charge nurse Gem cart brought to room and pt connected to monitor.

## 2014-04-21 NOTE — H&P (Signed)
Patient seen and examined. Agree with above note with modifications as noted. 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 This is compounded by the fact that he is not compliant with his medications and continues to drink. He had been referred to [facility] for an ablation procedure which he did not comply with either. Is coming in today with atrial flutter with rates in the 200s. Has responded quickly to IV diltiazem bolus and drip. Cardiology was consulted who believed that he could be managed here for the time being. Rate control medications are being adjusted by cardiology. We'll continue to follow.  Domingo Mend, MD [facility] Pager: [phone]

## 2014-04-21 NOTE — ED Notes (Signed)
Chest pain with heart racing for 1 hour Hx of  At fibrilation .  Alert,

## 2014-04-21 NOTE — Progress Notes (Signed)
ANTICOAGULATION CONSULT NOTE - Initial Consult  Pharmacy Consult for [place] Indication: Atrial Flutter  Allergies  Allergen Reactions  . Aspirin     Patient Measurements: Height: 6\' 3"  (190.5 cm) Weight: 196 lb 10.4 oz (89.2 kg) IBW/kg (Calculated) : 84.5 Heparin Dosing Weight:   Vital Signs: Temp: 98 F (36.7 C) (10/08 1104) Temp Source: Oral (10/08 1536) BP: 143/115 mmHg (10/08 1536) Pulse Rate: 120 (10/08 1536)  Labs:  Recent Labs  04/21/14 1115 04/21/14 1320  HGB 18.0*  --   HCT 51.6  --   PLT 226  --   LABPROT  --  13.2  INR  --  1.00  CREATININE 0.83  --   TROPONINI <0.30  --     Estimated Creatinine Clearance: 147.1 ml/min (by C-G formula based on Cr of 0.83).   Medical History: Past Medical History  Diagnosis Date  . Complete transposition of great vessels   . Hypertension   . Dyslipidemia   . Depression   . Alcohol abuse   . [place] spotted fever 11/2013    Medications:  Scheduled:  . busPIRone  15 mg Oral BID  . diltiazem  180 mg Oral Q12H  . enoxaparin (LOVENOX) injection  40 mg Subcutaneous Q24H  . FLUoxetine  20 mg Oral Daily  . folic acid  1 mg Oral Daily  . furosemide  20 mg Oral Daily  . losartan  50 mg Oral Daily  . metoprolol  100 mg Oral BID  . multivitamin with minerals  1 tablet Oral Daily  . pantoprazole  40 mg Oral Daily  . [START ON 04/22/2014] potassium chloride SA  20 mEq Oral Daily  . potassium chloride  40 mEq Oral Q4H  . QUEtiapine  50 mg Oral QHS  . sodium chloride  3 mL Intravenous Q12H  . thiamine  100 mg Oral Daily   Or  . thiamine  100 mg Intravenous Daily  . warfarin  10 mg Oral Once  . Warfarin - Pharmacist Dosing Inpatient   Does not apply q1800    Assessment: PTA Warfarin continuation for Atrial Flutter INR subtherapeutic on admission, INR 1.0 PTA Warfarin 7.5 mg po daily    Goal of Therapy:  INR 2-3 Monitor platelets by anticoagulation protocol: Yes   Plan:  Coumadin 10 mg po x 1 dose  tonight INR/PT daily Monitor CBC, platelets   Gergory Biello Bennett 04/21/2014,4:10 PM

## 2014-04-21 NOTE — ED Notes (Signed)
Report given to [facility] for [place].

## 2014-04-21 NOTE — ED Provider Notes (Signed)
CSN: [id number]     Arrival date & time 04/21/14  1057 History  This chart was scribed for Merryl Hacker, MD by Zola Button, ED Scribe. This patient was seen in room APA11/APA11 and the patient's care was started at 11:11 AM.      Chief Complaint  Patient presents with  . Chest Pain      HPI HPI Comments: Matthew [name] is a 38 y.o. 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 He has associated palpitations that began 45 min ago and SOB. Last time he was seen in the ED, he was given some medications to slow down his heart. Patient denies having CP currently but does report palpitations. Patient states that he has continued drinking EtOH.  He drinks daily, last use was last night. He reports having at least 6 drinks of EtOH daily. He states he has not been to [facility] yet due to insurance problems but had been referred there for ablation. Patient says he has not been to rehab, but he plans on starting rehab for his EtOH problem. He has been taking all of his medications except for Diltiazem, which he ran out 1 day ago.  Level V caveat for acuity of condition  Dr. Harrington Challenger, Lovena Le  Past Medical History  Diagnosis Date  . Complete transposition of great vessels   . Hypertension   . Dyslipidemia   . Depression   . Alcohol abuse   . [facility] spotted fever 11/2013   Past Surgical History  Procedure Laterality Date  . Volar plate arthroplasty, right small finger proxima linterphalangeal joint    . Septostomy      rashkind  . Mustard procedure     Family History  Problem Relation Age of Onset  . Rheumatic fever Father   . Hypertension Father    History  Substance Use Topics  . Smoking status: Never Smoker   . Smokeless tobacco: Not on file  . Alcohol Use: Yes     Comment: daily     Review of Systems  Constitutional: Negative.  Negative for fever.  Respiratory: Positive for chest tightness. Negative for shortness of breath.   Cardiovascular: Positive for chest pain and palpitations. Negative for leg swelling.  Gastrointestinal: Negative.  Negative for nausea, vomiting and abdominal pain.  Genitourinary: Negative.  Negative for dysuria.  All other systems reviewed and are negative.     Allergies  Aspirin  Home Medications   Prior to Admission medications   Medication Sig Start Date End Date Taking? Authorizing Provider  ALPRAZolam Duanne Moron) 0.5 MG tablet Take 1 tablet (0.5 mg total) by mouth 3 (three) times daily as needed for anxiety. 01/05/14  Yes Geradine Girt, DO  busPIRone (BUSPAR) 15 MG tablet Take 15 mg by mouth 2 (two) times daily.   Yes Historical Provider, MD  diltiazem (CARDIZEM SR) 90 MG 12 hr capsule Take 180 mg by mouth every 12 (twelve) hours.   Yes Historical Provider, MD  FLUoxetine (PROZAC) 20 MG capsule Take 20 mg by mouth daily.   Yes Historical Provider, MD  folic acid (FOLVITE) 1 MG tablet Take 1 tablet (1 mg total) by mouth daily. 01/05/14  Yes Geradine Girt, DO  furosemide (LASIX) 20 MG tablet Take 1 tablet (20 mg total) by mouth daily. 01/17/14  Yes Selinda Eon  M Lenze, PA-C  losartan (COZAAR) 50 MG tablet Take 50 mg by mouth daily.  11/18/13  Yes Historical Provider, MD  metoprolol (LOPRESSOR) 100 MG tablet Take 1 tablet (100 mg total) by mouth 2 (two) times daily. 01/17/14  Yes Imogene Burn, PA-C  pantoprazole (PROTONIX) 40 MG tablet Take 40 mg by mouth daily.   Yes Historical Provider, MD  warfarin (COUMADIN) 7.5 MG tablet Take 1 tablet (7.5 mg total) by mouth daily at 6 PM. 01/05/14  Yes Geradine Girt, DO  feeding supplement, ENSURE COMPLETE, (ENSURE COMPLETE) LIQD Take 237 mLs by mouth 2 (two) times daily between meals. 01/05/14   Geradine Girt, DO  potassium chloride SA (KLOR-CON M20) 20 MEQ tablet Take 1 tablet (20 mEq total) by mouth  daily. 01/17/14   Imogene Burn, PA-C  QUEtiapine (SEROQUEL XR) 50 MG TB24 24 hr tablet Take 1 tablet (50 mg total) by mouth at bedtime. 01/05/14   Jessica U Vann, DO   BP 134/99  Pulse 112  Temp(Src) 98 F (36.7 C) (Oral)  Resp 22  Ht 6\' 3"  (1.905 m)  Wt 200 lb (90.719 kg)  BMI 25.00 kg/m2  SpO2 95% Physical Exam  Nursing note and vitals reviewed. Constitutional: He is oriented to person, place, and time.  Diaphoretic  HENT:  Head: Normocephalic and atraumatic.  Cardiovascular: Regular rhythm and normal heart sounds.   No murmur heard. Tachycardia  Pulmonary/Chest: Effort normal and breath sounds normal. No respiratory distress. He has no wheezes.  Abdominal: Soft. There is no tenderness.  Musculoskeletal: He exhibits no edema.  Neurological: He is alert and oriented to person, place, and time.  Skin: Skin is warm and dry.  Psychiatric:  Anxious    ED Course  Procedures   CRITICAL CARE Performed by: Thayer Jew, F   Total critical care time: 45 min  Critical care time was exclusive of separately billable procedures and treating other patients.  Critical care was necessary to treat or prevent imminent or life-threatening deterioration.  Critical care was time spent personally by me on the following activities: development of treatment plan with patient and/or surrogate as well as nursing, discussions with consultants, evaluation of patient's response to treatment, examination of patient, obtaining history from patient or surrogate, ordering and performing treatments and interventions, ordering and review of laboratory studies, ordering and review of radiographic studies, pulse oximetry and re-evaluation of patient's condition.  DIAGNOSTIC STUDIES: Oxygen Saturation is 98% on RA, nml by my interpretation.    COORDINATION OF CARE: 11:34 AM-Discussed treatment plan which includes diltiazem, EKG, and labs with pt at bedside and pt agreed to plan.   Labs Review Labs  Reviewed  CBC WITH DIFFERENTIAL - Abnormal; Notable for the following:    WBC 14.6 (*)    Hemoglobin 18.0 (*)    Neutro Abs 10.5 (*)    Monocytes Relative 13 (*)    Monocytes Absolute 1.8 (*)    All other components within normal limits  BASIC METABOLIC PANEL - Abnormal; Notable for the following:    Potassium 3.1 (*)    Glucose, Bld 141 (*)    All other components within normal limits  TROPONIN I  ETHANOL  PROTIME-INR  URINE RAPID DRUG SCREEN (HOSP PERFORMED)    Imaging Review Dg Chest Portable 1 View  04/21/2014   CLINICAL DATA:  Shortness of breath and tachycardia; history of atrial fibrillation and complete transposition of the great vessels treated with surgery as a child; history of rod  and spot fever in May of 2015  EXAM: PORTABLE CHEST - 1 VIEW  COMPARISON:  Portable chest x-ray of December 29, 2013  FINDINGS: The current exam is taken in a somewhat lordotic projection. The lungs are adequately inflated. There are no alveolar infiltrates. The interstitial markings remain coarse bilaterally. The cardiopericardial silhouette is top-normal in size. There is no pleural effusion.  IMPRESSION: Mild pulmonary interstitial edema is suspected. There is no alveolar edema nor pleural effusion. A PA and lateral chest x-ray would be useful when the patient can tolerate the procedure.   Electronically Signed   By: David  Martinique   On: 04/21/2014 12:14     EKG Interpretation   Date/Time:  Thursday April 21 2014 11:40:46 EDT Ventricular Rate:  110 PR Interval:  139 QRS Duration: 106 QT Interval:  395 QTC Calculation: 534 R Axis:   -28 Text Interpretation:  atrial tachycardia Probable posterior infarct, acute  Anterior ST elevation, probably due to LVH Lateral leads are also involved  Prolonged QT interval Similar to June 2015 Confirmed by Dina Rich  MD,  [place] ([id number]) on 04/21/2014 1:17:09 PM      Medications  LORazepam (ATIVAN) injection 0-4 mg (1 mg Intravenous Given 04/21/14 1235)   LORazepam (ATIVAN) injection 0-4 mg (0 mg Intravenous Not Given 04/21/14 1311)  thiamine (VITAMIN B-1) tablet 100 mg (100 mg Oral Not Given 04/21/14 1236)  thiamine (B-1) injection 100 mg (100 mg Intravenous Given 04/21/14 1235)  adenosine (ADENOCARD) 6 MG/2ML injection (6 mg  Given 04/21/14 1123)  adenosine (ADENOCARD) 6 MG/2ML injection (12 mg  Given 04/21/14 1128)  diltiazem (CARDIZEM) 25 MG/5ML injection (  New Bag/Given 04/21/14 1141)  diltiazem (CARDIZEM) injection 15 mg (0 mg Intravenous Stopped 04/21/14 1135)  sodium chloride 0.9 % 1,000 mL with thiamine 564 mg, folic acid 1 mg, multivitamins adult 10 mL infusion ( Intravenous New Bag/Given 04/21/14 1328)  potassium chloride 20 MEQ/15ML (10%) liquid 40 mEq (40 mEq Oral Given 04/21/14 1235)    MDM   Final diagnoses:  Atypical atrial flutter  Alcohol abuse    Patient presents with palpitations and tachycardia into the 220s. History of difficult to control a flutter. Unable to determine rhythm at this rate but suspect underlying atrial flutter. Patient was given 6 mg of adenosine followed by 12 which did not slow down his rhythm. Patient was subsequently given 15 mg of diltiazem with slowing of his rhythm into the 110s.  He was placed on a diltiazem drip and has maintained rate control. Lab work and chest x-ray are largely unremarkable.  Decompensation likely secondary to not taking his diltiazem and alcohol abuse. Potassium is 3.1. This was replaced. Patient was given a banana bag and place on CIWA protocol. Discussed with Dr. Bronson Ing.  Feels the patient has stabilized, would be appropriate for admission here. Discussed with Dr. Jerilee Hoh. Patient will be admitted to the step down unit.  I personally performed the services described in this documentation, which was scribed in my presence. The recorded information has been reviewed and is accurate.     Merryl Hacker, MD 04/21/14 [phone]

## 2014-04-21 NOTE — Consult Note (Signed)
CARDIOLOGY CONSULT NOTE  Patient ID: GOBLE FUDALA MRN: [id number] DOB/AGE: 36-Dec-1977 36 y.o.  Admit date: 04/21/2014 Primary Physician Glo Herring., MD  Reason for Consultation: tachycardia, congenital heart disease  HPI: The patient is a 38 yr old male with a PMH significant for transposition of the great vessels s/p Runkind septostomy s/p Mustard procedure at 19 months of age, atypical atrial flutter, HTN, and alcohol abuse. He has been evaluated by EP (Dr. Lovena Le) earlier this year who had arranged for him to undergo radiofrequency ablation at [facility]. Due to running out of insurance, he was initially unable to proceed with this plan. He has since gotten a job and has insurance again. He has been maintained on metoprolol, diltiazem, and warfarin, and had been instructed not to drink alcohol while on warfarin.  He ran out of diltiazem approximately 36-48 hours ago as Paediatric nurse did not have it. He drank a pint of liquor last night. He drinks anywhere from 2-6 beers daily and on other days drinks a pint of liquor with one beer. He does not smoke. He normally avoids caffeine use. He awoke this morning and drank 3 Cherry Cokes. He did not take metoprolol this morning either. He dropped his child off of school and went home and experienced nausea and vomiting. At approximately 10:30 am, he began experiencing palpitations with shortness of breath. He denies chest pain. Initial HR 218 bpm in ED. He was given adenosine which did not do anything and was then given IV diltiazem followed by continuous diltiazem infusion, with HR in 110 bpm range. ECG demonstrating atrial tachycardia (probable atypical atrial flutter).  Most recent echo in June 2015 showed mildly reduced systemic ventricular function, EF 45-50% (see below).  K 3.1, INR subtherapeutic at 1, leukocytosis and neutrophilia noted. CXR showed mild pulmonary interstitial edema, no infiltrates.  At the time of my  evaluation, he was feeling much better. HR 110-124 bpm. He had yet to receive oral metoprolol or diltiazem and was on an infusion 10 mg/hr.    Allergies  Allergen Reactions  . Aspirin     Current Facility-Administered Medications  Medication Dose Route Frequency Provider Last Rate Last Dose  . LORazepam (ATIVAN) injection 0-4 mg  0-4 mg Intravenous 4 times per day Merryl Hacker, MD   1 mg at 04/21/14 1235  . LORazepam (ATIVAN) injection 0-4 mg  0-4 mg Intravenous Q12H Barbette Hair Horton, MD      . thiamine (B-1) injection 100 mg  100 mg Intravenous Daily Merryl Hacker, MD   100 mg at 04/21/14 1235  . thiamine (VITAMIN B-1) tablet 100 mg  100 mg Oral Daily Merryl Hacker, MD       Current Outpatient Prescriptions  Medication Sig Dispense Refill  . ALPRAZolam (XANAX) 0.5 MG tablet Take 1 tablet (0.5 mg total) by mouth 3 (three) times daily as needed for anxiety.  15 tablet  0  . busPIRone (BUSPAR) 15 MG tablet Take 15 mg by mouth 2 (two) times daily.      Marland Kitchen diltiazem (CARDIZEM SR) 90 MG 12 hr capsule Take 180 mg by mouth every 12 (twelve) hours.      Marland Kitchen FLUoxetine (PROZAC) 20 MG capsule Take 20 mg by mouth daily.      . folic acid (FOLVITE) 1 MG tablet Take 1 tablet (1 mg total) by mouth daily.      . furosemide (LASIX) 20 MG tablet Take 1 tablet (20 mg total) by  mouth daily.  30 tablet  3  . losartan (COZAAR) 50 MG tablet Take 50 mg by mouth daily.       . metoprolol (LOPRESSOR) 100 MG tablet Take 1 tablet (100 mg total) by mouth 2 (two) times daily.  60 tablet  4  . pantoprazole (PROTONIX) 40 MG tablet Take 40 mg by mouth daily.      Marland Kitchen warfarin (COUMADIN) 7.5 MG tablet Take 1 tablet (7.5 mg total) by mouth daily at 6 PM.  30 tablet  0  . feeding supplement, ENSURE COMPLETE, (ENSURE COMPLETE) LIQD Take 237 mLs by mouth 2 (two) times daily between meals.      . potassium chloride SA (KLOR-CON M20) 20 MEQ tablet Take 1 tablet (20 mEq total) by mouth daily.  30 tablet  3  .  QUEtiapine (SEROQUEL XR) 50 MG TB24 24 hr tablet Take 1 tablet (50 mg total) by mouth at bedtime.  30 each  0    Past Medical History  Diagnosis Date  . Complete transposition of great vessels   . Hypertension   . Dyslipidemia   . Depression   . Alcohol abuse   . [facility] spotted fever 11/2013    Past Surgical History  Procedure Laterality Date  . Volar plate arthroplasty, right small finger proxima linterphalangeal joint    . Septostomy      rashkind  . Mustard procedure      History   Social History  . Marital Status: Married    Spouse Name: N/A    Number of Children: N/A  . Years of Education: N/A   Occupational History  . Not on file.   Social History Main Topics  . Smoking status: Never Smoker   . Smokeless tobacco: Not on file  . Alcohol Use: Yes     Comment: daily  . Drug Use: No  . Sexual Activity: Not on file   Other Topics Concern  . Not on file   Social History Narrative  . No narrative on file     No family history of premature CAD in 1st degree relatives.  Prior to Admission medications   Medication Sig Start Date End Date Taking? Authorizing Provider  ALPRAZolam Duanne Moron) 0.5 MG tablet Take 1 tablet (0.5 mg total) by mouth 3 (three) times daily as needed for anxiety. 01/05/14  Yes Geradine Girt, DO  busPIRone (BUSPAR) 15 MG tablet Take 15 mg by mouth 2 (two) times daily.   Yes Historical Provider, MD  diltiazem (CARDIZEM SR) 90 MG 12 hr capsule Take 180 mg by mouth every 12 (twelve) hours.   Yes Historical Provider, MD  FLUoxetine (PROZAC) 20 MG capsule Take 20 mg by mouth daily.   Yes Historical Provider, MD  folic acid (FOLVITE) 1 MG tablet Take 1 tablet (1 mg total) by mouth daily. 01/05/14  Yes Geradine Girt, DO  furosemide (LASIX) 20 MG tablet Take 1 tablet (20 mg total) by mouth daily. 01/17/14  Yes Imogene Burn, PA-C  losartan (COZAAR) 50 MG tablet Take 50 mg by mouth daily.  11/18/13  Yes Historical Provider, MD  metoprolol  (LOPRESSOR) 100 MG tablet Take 1 tablet (100 mg total) by mouth 2 (two) times daily. 01/17/14  Yes Imogene Burn, PA-C  pantoprazole (PROTONIX) 40 MG tablet Take 40 mg by mouth daily.   Yes Historical Provider, MD  warfarin (COUMADIN) 7.5 MG tablet Take 1 tablet (7.5 mg total) by mouth daily at 6 PM. 01/05/14  Yes  Geradine Girt, DO  feeding supplement, ENSURE COMPLETE, (ENSURE COMPLETE) LIQD Take 237 mLs by mouth 2 (two) times daily between meals. 01/05/14   Geradine Girt, DO  potassium chloride SA (KLOR-CON M20) 20 MEQ tablet Take 1 tablet (20 mEq total) by mouth daily. 01/17/14   Imogene Burn, PA-C  QUEtiapine (SEROQUEL XR) 50 MG TB24 24 hr tablet Take 1 tablet (50 mg total) by mouth at bedtime. 01/05/14   Geradine Girt, DO     Review of systems complete and found to be negative unless listed above in HPI     Physical exam Blood pressure 123/99, pulse 110, temperature 98 F (36.7 C), temperature source Oral, resp. rate 25, height 6\' 3"  (1.905 m), weight 200 lb (90.719 kg), SpO2 93.00%. General: NAD Neck: No JVD, no thyromegaly or thyroid nodule.  Lungs: Clear to auscultation bilaterally with normal respiratory effort. CV: Nondisplaced PMI. Tachycardic, normal S1/S2, no Z7/Q7,  2/6 pansystolic murmur along left sternal border.  No peripheral edema.  No carotid bruit.  Normal pedal pulses.  Abdomen: Soft, nontender, no hepatosplenomegaly, no distention.  Skin: Intact without lesions or rashes.  Neurologic: Alert and oriented x 3.  Psych: Normal affect. Extremities: No clubbing or cyanosis.  HEENT: Normal.   ECG: Most recent ECG reviewed.  Labs:   Lab Results  Component Value Date   WBC 14.6* 04/21/2014   HGB 18.0* 04/21/2014   HCT 51.6 04/21/2014   MCV 94.9 04/21/2014   PLT 226 04/21/2014    Recent Labs Lab 04/21/14 1115  NA 142  K 3.1*  CL 97  CO2 24  BUN 8  CREATININE 0.83  CALCIUM 9.2  GLUCOSE 141*   Lab Results  Component Value Date   TROPONINI <0.30 04/21/2014     Lab Results  Component Value Date   CHOL 148 01/02/2014   CHOL 226* 08/10/2012   CHOL 186 04/17/2011   Lab Results  Component Value Date   HDL 46 01/02/2014   HDL 47.10 08/10/2012   HDL 46.70 04/17/2011   Lab Results  Component Value Date   LDLCALC 87 01/02/2014   LDLCALC 130* 04/17/2011   LDLCALC 132* 01/24/2009   Lab Results  Component Value Date   TRIG 75 01/02/2014   TRIG 85.0 08/10/2012   TRIG 45.0 04/17/2011   Lab Results  Component Value Date   CHOLHDL 3.2 01/02/2014   CHOLHDL 5 08/10/2012   CHOLHDL 4 04/17/2011   Lab Results  Component Value Date   LDLDIRECT 162.4 08/10/2012         Studies: Dg Chest Portable 1 View  04/21/2014   CLINICAL DATA:  Shortness of breath and tachycardia; history of atrial fibrillation and complete transposition of the great vessels treated with surgery as a child; history of rod and spot fever in May of 2015  EXAM: PORTABLE CHEST - 1 VIEW  COMPARISON:  Portable chest x-ray of December 29, 2013  FINDINGS: The current exam is taken in a somewhat lordotic projection. The lungs are adequately inflated. There are no alveolar infiltrates. The interstitial markings remain coarse bilaterally. The cardiopericardial silhouette is top-normal in size. There is no pleural effusion.  IMPRESSION: Mild pulmonary interstitial edema is suspected. There is no alveolar edema nor pleural effusion. A PA and lateral chest x-ray would be useful when the patient can tolerate the procedure.   Electronically Signed   By: David  Martinique   On: 04/21/2014 12:14    Echo (12-30-13): - Patient with D transposition  of the great vessels s/p Mustard procedure (atrial switch). There is evidence of a baffle in left atrium. Moderate RAE. Moderate RVE (systemic ventricle) with severely reduced function; moderate regurgitation throught the systemic AV valve (TR); mild AI. Morphologic LV shows septal hypokinesis with mildly reduced function (EF 45-50); mild MR. Compared to 04/12/11, systemic  ventricular function is worse.    ASSESSMENT AND PLAN:  1. Atypical atrial flutter with RVR: Currently on diltiazem drip with improved rate control. I suspect his current exacerbation is a result of ETOH abuse and missing doses of medications. Will request pharmacy consult for warfarin administration. Will restart metoprolol 100 mg bid and diltiazem 180 mg bid (as per home regimen). He will need to f/u at [facility] for ablation which was previously arranged, now that he has insurance. 2. Acute on chronic systolic heart failure: Due to rapid atypical atrial flutter. Lungs now clear. Continue oral Lasix. 3. Essential HTN: Elevated DBP. Continue to monitor for now. 4. Alcohol abuse: Needs alcohol withdrawal protocol given h/o DT's in past. Cessation counseling provided.   Signed: Kate Sable, M.D., F.A.C.C.  04/21/2014, 3:05 PM

## 2014-04-21 NOTE — Progress Notes (Signed)
Patient admits to being alcoholic. CIWA protocol initiated in ED. Patient states that he wishes to stop drinking. Schedule for [place] meetings given to patient.

## 2014-04-21 NOTE — H&P (Signed)
[facility] History and Physical  Matthew [name]:[id number] DOB: 1976/06/25 DOA: 04/21/2014  Referring physician:  PCP: Glo Herring., MD   Chief Complaint: palpitations  HPI: Matthew [name] is a 38 y.o. 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 Initial evaluation in the emergency department revealed fibrillation with rapid ventricular response rate of 188, hypoxia with oxygen saturation level 85% on room air.   Patient reports that this morning he awakened and initially felt palpitations. Associated symptoms include some nausea without vomiting and brief shortness of breath. He denied chest pain abdominal pain vomiting. He denied headache visual disturbances syncope or near-syncope. He reports compliance with his medications that include metoprolol diltiazem and Coumadin. He reports daily EtOH consumption with 6 beers consumed last night.  Lab work in the emergency department includes a complete metabolic panel significant for potassium level of 3.1 serum glucose 141. Complete blood count significant for WBCs of 14.6. Initial troponin negative. Alcohol level less than 11. INR 1.0. Chest x-ray mild pulmonary interstitial edema is suspected. There is no alveolar edema nor pleural effusion. A PA and lateral chest x-ray would be  useful when the patient can tolerate the procedure. EKG with atrial tachycardia.   In the emergency department he was initially given adenosine and with no effect and was given a diltiazem bolus followed by a diltiazem drip. At the time of my exam his heart rate was between 110 and 120. He is afebrile with a stable blood pressure and not hypoxic.    Review of Systems:  10 point review of systems completed all systems are negative except as indicated in history of present illness     Past Medical History  Diagnosis Date  . Complete transposition of great vessels   . Hypertension   . Dyslipidemia   . Depression   . Alcohol abuse   . [facility] spotted fever 11/2013   Past Surgical History  Procedure Laterality Date  . Volar plate arthroplasty, right small finger proxima linterphalangeal joint    . Septostomy      rashkind  . Mustard procedure     Social History:  reports that he has never smoked. He does not have any smokeless tobacco history on file. He reports that he drinks alcohol. He reports that he does not use illicit drugs. He is single and lives alone. 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 He drinks daily at least 6 beers a day. Independent with ADLs. Allergies  Allergen Reactions  . Aspirin     Family History  Problem Relation Age of Onset  . Rheumatic fever Father   . Hypertension Father      Prior to Admission medications   Medication Sig Start Date End Date Taking? Authorizing Provider  ALPRAZolam Duanne Moron) 0.5 MG tablet Take 1 tablet (0.5 mg total) by mouth 3 (three) times daily as needed for anxiety. 01/05/14  Yes Geradine Girt, DO  busPIRone (BUSPAR) 15 MG tablet Take 15 mg by mouth 2 (two) times daily.   Yes Historical Provider, MD  diltiazem (CARDIZEM SR) 90 MG 12 hr capsule Take 180 mg by mouth every 12 (twelve) hours.   Yes Historical Provider, MD  FLUoxetine (PROZAC) 20 MG capsule Take 20 mg by mouth daily.   Yes Historical Provider, MD  folic acid (FOLVITE) 1 MG tablet Take 1 tablet (1 mg  total) by mouth daily. 01/05/14  Yes Geradine Girt, DO  furosemide (LASIX) 20 MG tablet Take 1 tablet (20 mg total) by mouth daily. 01/17/14  Yes Imogene Burn, PA-C  losartan (COZAAR) 50 MG tablet Take 50 mg by mouth daily.  11/18/13  Yes Historical Provider, MD  metoprolol (LOPRESSOR) 100 MG tablet Take 1 tablet (100 mg total) by mouth 2 (two) times daily. 01/17/14  Yes Imogene Burn, PA-C  pantoprazole (PROTONIX) 40 MG tablet  Take 40 mg by mouth daily.   Yes Historical Provider, MD  warfarin (COUMADIN) 7.5 MG tablet Take 1 tablet (7.5 mg total) by mouth daily at 6 PM. 01/05/14  Yes Geradine Girt, DO  feeding supplement, ENSURE COMPLETE, (ENSURE COMPLETE) LIQD Take 237 mLs by mouth 2 (two) times daily between meals. 01/05/14   Geradine Girt, DO  potassium chloride SA (KLOR-CON M20) 20 MEQ tablet Take 1 tablet (20 mEq total) by mouth daily. 01/17/14   Imogene Burn, PA-C  QUEtiapine (SEROQUEL XR) 50 MG TB24 24 hr tablet Take 1 tablet (50 mg total) by mouth at bedtime. 01/05/14   Geradine Girt, DO   Physical Exam: Filed Vitals:   04/21/14 1430 04/21/14 1500 04/21/14 1515 04/21/14 1536  BP: 134/99 123/99  143/115  Pulse: 112 110 115 120  Temp:      TempSrc:    Oral  Resp: 22 25 25    Height:    6\' 3"  (1.905 m)  Weight:    89.2 kg (196 lb 10.4 oz)  SpO2: 95% 93% 98% 97%    Wt Readings from Last 3 Encounters:  04/21/14 89.2 kg (196 lb 10.4 oz)  01/17/14 97.523 kg (215 lb)  01/05/14 89.086 kg (196 lb 6.4 oz)    General:  Appears somewhat anxious but no acute distress Eyes: PERRL, normal lids, irises & conjunctiva ENT: grossly normal hearing, lips & tongue Neck: no LAD, masses or thyromegaly Cardiovascular: .tachycardic but regular. i hear no murmur no gallup no LE edema  Respiratory: CTA bilaterally, no w/r/r. Normal respiratory effort. Abdomen: soft, ntnd +BS  Skin: no rash or induration seen on limited exam Musculoskeletal: grossly normal tone BUE/BLE Psychiatric: anxious mood and affect, speech fluent and appropriate Neurologic: grossly non-focal. Speech clear           Labs on Admission:  Basic Metabolic Panel:  Recent Labs Lab 04/21/14 1115  NA 142  K 3.1*  CL 97  CO2 24  GLUCOSE 141*  BUN 8  CREATININE 0.83  CALCIUM 9.2   Liver Function Tests: No results found for this basename: AST, ALT, ALKPHOS, BILITOT, PROT, ALBUMIN,  in the last 168 hours No results found for this basename:  LIPASE, AMYLASE,  in the last 168 hours No results found for this basename: AMMONIA,  in the last 168 hours CBC:  Recent Labs Lab 04/21/14 1115  WBC 14.6*  NEUTROABS 10.5*  HGB 18.0*  HCT 51.6  MCV 94.9  PLT 226   Cardiac Enzymes:  Recent Labs Lab 04/21/14 1115  TROPONINI <0.30    BNP (last 3 results) No results found for this basename: PROBNP,  in the last 8760 hours CBG: No results found for this basename: GLUCAP,  in the last 168 hours  Radiological Exams on Admission: Dg Chest Portable 1 View  04/21/2014   CLINICAL DATA:  Shortness of breath and tachycardia; history of atrial fibrillation and complete transposition of the great vessels treated with surgery as a child; history of  rod and spot fever in May of 2015  EXAM: PORTABLE CHEST - 1 VIEW  COMPARISON:  Portable chest x-ray of December 29, 2013  FINDINGS: The current exam is taken in a somewhat lordotic projection. The lungs are adequately inflated. There are no alveolar infiltrates. The interstitial markings remain coarse bilaterally. The cardiopericardial silhouette is top-normal in size. There is no pleural effusion.  IMPRESSION: Mild pulmonary interstitial edema is suspected. There is no alveolar edema nor pleural effusion. A PA and lateral chest x-ray would be useful when the patient can tolerate the procedure.   Electronically Signed   By: David  Martinique   On: 04/21/2014 12:14    EKG: Independently reviewed.atrial tachycadia  Assessment/Plan Principal Problem:   Atrial flutter: admit to SD. Continue diltiazem drip to be titrated. Defer po meds to cardiology. HR controlled. Etiology unclear. Chart review indicates hx of difficult to control flutter presumably because of his transposition of great vessels surgery per cards note dated 7/15. In the setting of ETOH abuse and non-compliance with medications. Coumadin per pharmacy. Appreciate Dr Jacinta Shoe assistance.   Active Problems:  Chronic systolic CHF (congestive heart  failure): echo 6/15 of the great vessels s/p Mustard procedure (atrial switch). There is evidence of a baffle in left atrium. Moderate RAE. Moderate RVE (systemic ventricle) with severely reduced function; moderate regurgitation throught the systemic AV valve (TR); mild AI. Morphologic LV shows septal hypokinesis with mildly reduced function (EF 45-50); mild MR. Compared to 04/12/11, systemic ventricular function is worse. Continue lasix per cards. Monitor intake and output daily weights. Continue home meds      HYPERTENSION, BENIGN: higher end of normal. Continue BB hold cardizem. Monitor    Hypokalemia: likely related to ETOH. repleat and recheck    Alcohol abuse: CIWA protocol. Reports hx of withdrawal      Code Status: full DVT Prophylaxis: Family Communication: mother at bedside Disposition Plan: home when ready  Time spent: 74 minutes  [facility] Pager [phone]

## 2014-04-22 DIAGNOSIS — E785 Hyperlipidemia, unspecified: Secondary | ICD-10-CM

## 2014-04-22 DIAGNOSIS — I9589 Other hypotension: Secondary | ICD-10-CM

## 2014-04-22 LAB — BASIC METABOLIC PANEL
Anion gap: 12 (ref 5–15)
BUN: 7 mg/dL (ref 6–23)
CO2: 24 mEq/L (ref 19–32)
Calcium: 8.1 mg/dL — ABNORMAL LOW (ref 8.4–10.5)
Chloride: 103 mEq/L (ref 96–112)
Creatinine, Ser: 0.69 mg/dL (ref 0.50–1.35)
GFR calc Af Amer: >90 mL/min (ref >90)
GFR calc non Af Amer: >90 mL/min (ref >90)
Glucose, Bld: 85 mg/dL (ref 70–99)
Potassium: 4.3 mEq/L (ref 3.7–5.3)
Sodium: 139 mEq/L (ref 137–147)

## 2014-04-22 LAB — CBC
HCT: 45.4 % (ref 39.0–52.0)
Hemoglobin: 15.5 g/dL (ref 13.0–17.0)
MCH: 32.7 pg (ref 26.0–34.0)
MCHC: 34.1 g/dL (ref 30.0–36.0)
MCV: 95.8 fL (ref 78.0–100.0)
Platelets: 144 10*3/uL — ABNORMAL LOW (ref 150–400)
RBC: 4.74 MIL/uL (ref 4.22–5.81)
RDW: 14.9 % (ref 11.5–15.5)
WBC: 6.5 10*3/uL (ref 4.0–10.5)

## 2014-04-22 LAB — PROTIME-INR
INR: 1.17 (ref 0.00–1.49)
Prothrombin Time: 14.9 seconds (ref 11.6–15.2)

## 2014-04-22 LAB — TSH: TSH: 0.639 u[IU]/mL (ref 0.350–4.500)

## 2014-04-22 MED ORDER — CETYLPYRIDINIUM CHLORIDE 0.05 % MT LIQD
7.0000 mL | Freq: Two times a day (BID) | OROMUCOSAL | Status: DC
  Administered 2014-04-22 – 2014-04-23 (×3): 7 mL via OROMUCOSAL

## 2014-04-22 MED ORDER — MUPIROCIN 2 % EX OINT
1.0000 "application " | TOPICAL_OINTMENT | Freq: Two times a day (BID) | CUTANEOUS | Status: DC
  Administered 2014-04-22 – 2014-04-23 (×3): 1 via NASAL
  Filled 2014-04-22 (×2): qty 22

## 2014-04-22 MED ORDER — WARFARIN SODIUM 10 MG PO TABS
10.0000 mg | ORAL_TABLET | Freq: Once | ORAL | Status: AC
  Administered 2014-04-22: 10 mg via ORAL
  Filled 2014-04-22: qty 1

## 2014-04-22 MED ORDER — CHLORHEXIDINE GLUCONATE CLOTH 2 % EX PADS
6.0000 | MEDICATED_PAD | Freq: Every day | CUTANEOUS | Status: DC
  Administered 2014-04-22 – 2014-04-23 (×2): 6 via TOPICAL

## 2014-04-22 NOTE — Progress Notes (Signed)
Cardizem drip stopped around 2300 and Lopressor 100 mg bedtime tablet held due to BP's 80's/50's and MAP of 60's.  Patient had one episode of increased HR >100 during which he was voiding in his urinal and standing at the time.  Patient slept for most of the night without incident.  On call MD informed of his night time BP's and HR and informed of his next scheduled blood pressure medications at 1000.  Informed it is okay to continue to hold the patient's Cardizem drip at this time.

## 2014-04-22 NOTE — Progress Notes (Signed)
Consulting cardiologist: Kate Sable MD Primary Cardiologist: Dorris Carnes  Subjective:    Feeling "a lot better." No complaints of pain or dyspnea   Objective:   Temp:  [97.6 F (36.4 C)-98.9 F (37.2 C)] 97.6 F (36.4 C) (10/09 0400) Pulse Rate:  [25-227] 65 (10/09 0430) Resp:  [14-32] 17 (10/09 0600) BP: (74-143)/(40-115) 89/69 mmHg (10/09 0600) SpO2:  [83 %-98 %] 95 % (10/09 0530) Weight:  [196 lb 10.4 oz (89.2 kg)-200 lb (90.719 kg)] 196 lb 10.4 oz (89.2 kg) (10/09 0500) Last BM Date: 04/21/14  Filed Weights   04/21/14 1104 04/21/14 1536 04/22/14 0500  Weight: 200 lb (90.719 kg) 196 lb 10.4 oz (89.2 kg) 196 lb 10.4 oz (89.2 kg)    Intake/Output Summary (Last 24 hours) at 04/22/14 0811 Last data filed at 04/22/14 0600  Gross per 24 hour  Intake 1913.16 ml  Output    600 ml  Net 1313.16 ml    Telemetry:Atrial Flutter, with Blocked PAC's HR-'s in the 90's to 100's.   Exam:  General: No acute distress.  HEENT: Conjunctiva and lids normal, oropharynx clear.  Lungs: Clear to auscultation, nonlabored.  Cardiac: No elevated JVP or bruits. IRRR, tachycardic, no gallop or rub.   Abdomen: Normoactive bowel sounds, nontender, nondistended.  Extremities: No pitting edema, distal pulses full.  Neuropsychiatric: Alert and oriented x3, affect appropriate.   Lab Results:  Basic Metabolic Panel:  Recent Labs Lab 04/21/14 1115 04/21/14 1627 04/22/14 0549  NA 142  --  139  K 3.1*  --  4.3  CL 97  --  103  CO2 24  --  24  GLUCOSE 141*  --  85  BUN 8  --  7  CREATININE 0.83  --  0.69  CALCIUM 9.2  --  8.1*  MG  --  1.2*  --     CBC:  Recent Labs Lab 04/21/14 1115 04/22/14 0549  WBC 14.6* 6.5  HGB 18.0* 15.5  HCT 51.6 45.4  MCV 94.9 95.8  PLT 226 144*    Cardiac Enzymes:  Recent Labs Lab 04/21/14 1115  TROPONINI <0.30    Coagulation:  Recent Labs Lab 04/21/14 1320 04/22/14 0549  INR 1.00 1.17    Radiology: Dg Chest  Portable 1 View  04/21/2014   CLINICAL DATA:  Shortness of breath and tachycardia; history of atrial fibrillation and complete transposition of the great vessels treated with surgery as a child; history of rod and spot fever in May of 2015  EXAM: PORTABLE CHEST - 1 VIEW  COMPARISON:  Portable chest x-ray of December 29, 2013  FINDINGS: The current exam is taken in a somewhat lordotic projection. The lungs are adequately inflated. There are no alveolar infiltrates. The interstitial markings remain coarse bilaterally. The cardiopericardial silhouette is top-normal in size. There is no pleural effusion.  IMPRESSION: Mild pulmonary interstitial edema is suspected. There is no alveolar edema nor pleural effusion. A PA and lateral chest x-ray would be useful when the patient can tolerate the procedure.   Electronically Signed   By: David  Martinique   On: 04/21/2014 12:14      Medications:   Scheduled Medications: . antiseptic oral rinse  7 mL Mouth Rinse BID  . busPIRone  15 mg Oral BID  . Chlorhexidine Gluconate Cloth  6 each Topical Q0600  . diltiazem  180 mg Oral Q12H  . FLUoxetine  20 mg Oral Daily  . folic acid  1 mg Oral Daily  . furosemide  20 mg Oral Daily  . losartan  50 mg Oral Daily  . metoprolol  100 mg Oral BID  . multivitamin with minerals  1 tablet Oral Daily  . mupirocin ointment  1 application Nasal BID  . pantoprazole  40 mg Oral Daily  . potassium chloride SA  20 mEq Oral Daily  . QUEtiapine  50 mg Oral QHS  . sodium chloride  3 mL Intravenous Q12H  . thiamine  100 mg Oral Daily   Or  . thiamine  100 mg Intravenous Daily  . Warfarin - Pharmacist Dosing Inpatient   Does not apply q1800    Infusions: . sodium chloride 75 mL/hr at 04/22/14 0500  . diltiazem (CARDIZEM) infusion Stopped (04/21/14 2314)    PRN Medications: acetaminophen, acetaminophen, ALPRAZolam, alum & mag hydroxide-simeth, HYDROcodone-acetaminophen, LORazepam, LORazepam, ondansetron (ZOFRAN) IV,  ondansetron   Assessment and Plan:   1.Atrial flutter with RVR: Continues on diltiazem gtt, with transition to PO yesterday. O metoprolol 100 mg BID. Will d/c diltiazem gtt. HR is still not adequately controlled running in the low 90's to low 100's. Currently diltiazem at 180 mg BID. May need further titration. BP stable. Now back on coumadin. Uncertain if this is best choice with hx of non-compliance and ETOH. Consider changing to NOAC instead. INR  1.17.   He wished to pursue RA at [facility] and wants [place] to set that up. I think we should have him follow up with Dr. Lovena Le as OP first and then begin the process.   2. Hypertension: BP is controlled.  3. Acute on Chronic Systolic CHF: Diuresed 5.726 since admission. Continues on lasix 20 mg daily.  4.ETOH: No evidence of DT's currently.   Phill Myron. Viktor Philipp NP  04/22/2014, 8:11 AM

## 2014-04-22 NOTE — Progress Notes (Signed)
UR chart review completed.  

## 2014-04-22 NOTE — Progress Notes (Signed)
[place] for [place] Indication: Atrial Flutter  Allergies  Allergen Reactions  . Aspirin     Patient Measurements: Height: 6\' 3"  (190.5 cm) Weight: 196 lb 10.4 oz (89.2 kg) IBW/kg (Calculated) : 84.5  Vital Signs: Temp: 97.6 F (36.4 C) (10/09 0400) Temp Source: Oral (10/09 0400) BP: 89/69 mmHg (10/09 0600) Pulse Rate: 65 (10/09 0430)  Labs:  Recent Labs  04/21/14 1115 04/21/14 1320 04/22/14 0549  HGB 18.0*  --  15.5  HCT 51.6  --  45.4  PLT 226  --  144*  LABPROT  --  13.2 14.9  INR  --  1.00 1.17  CREATININE 0.83  --  0.69  TROPONINI <0.30  --   --     Estimated Creatinine Clearance: 152.6 ml/min (by C-G formula based on Cr of 0.69).   Medical History: Past Medical History  Diagnosis Date  . Complete transposition of great vessels   . Hypertension   . Dyslipidemia   . Depression   . Alcohol abuse   . [place] fever 11/2013    Medications:  Scheduled:  . antiseptic oral rinse  7 mL Mouth Rinse BID  . busPIRone  15 mg Oral BID  . Chlorhexidine Gluconate Cloth  6 each Topical Q0600  . diltiazem  180 mg Oral Q12H  . FLUoxetine  20 mg Oral Daily  . folic acid  1 mg Oral Daily  . furosemide  20 mg Oral Daily  . losartan  50 mg Oral Daily  . metoprolol  100 mg Oral BID  . multivitamin with minerals  1 tablet Oral Daily  . mupirocin ointment  1 application Nasal BID  . pantoprazole  40 mg Oral Daily  . potassium chloride SA  20 mEq Oral Daily  . QUEtiapine  50 mg Oral QHS  . sodium chloride  3 mL Intravenous Q12H  . thiamine  100 mg Oral Daily   Or  . thiamine  100 mg Intravenous Daily  . Warfarin - Pharmacist Dosing Inpatient   Does not apply q1800    Assessment: 17 yoM on chronic Coumadin for hx Afib.   INR at baseline on admission.  Patient states he had not taken Coumadin in >1week due to drinking binge.  He has 7.5mg  tablets at home.  He expresses his INR has been difficult to regulate due  to his drinking and his dose changes "on a week to week basis."   No bleeding noted.   Goal of Therapy:  INR 2-3   Plan:  Coumadin 10 mg po x 1 dose tonight INR daily  Laterria [place], Lavonia Drafts 04/22/2014,10:27 AM

## 2014-04-22 NOTE — Progress Notes (Signed)
[facility] PROGRESS NOTE  GUY SEESE OZD:[id number] DOB: 12-24-1975 DOA: 04/21/2014 PCP: Glo Herring., MD  Assessment/Plan: Atrial flutter: rate controlled. Diltiazem drip discontinued during night due to low BP. HR controlled. BB and diltiazem on hold for now due to soft BP. Will resume once BP improves. Providing IV fluids.  Pt hx of ETOH abuse and non-compliance with medications.  Coumadin per pharmacy. Appreciate Dr Jacinta Shoe assistance.  Active Problems:  Chronic systolic CHF (congestive heart failure): compensated. Continue home lasix  HYPERTENSION, BENIGN: Soft. Holding BB and  Cardizem. IV fluids.  Monitor   Hypokalemia: resolved   Alcohol abuse: CIWA protocol. Reports hx of withdrawal. No s/sx withdrawal  Code Status: full Family Communication: none present Disposition Plan: home hopefully 24-48 hours   Consultants:  cardiology  Procedures:  none  Antibiotics:  none  HPI/Subjective: Awake alert denies pain/discomfort. Denies palpitation or sob.   Objective: Filed Vitals:   04/22/14 1039  BP: 102/80  Pulse: 110  Temp:   Resp:     Intake/Output Summary (Last 24 hours) at 04/22/14 1056 Last data filed at 04/22/14 0600  Gross per 24 hour  Intake 1913.16 ml  Output    600 ml  Net 1313.16 ml   Filed Weights   04/21/14 1104 04/21/14 1536 04/22/14 0500  Weight: 90.719 kg (200 lb) 89.2 kg (196 lb 10.4 oz) 89.2 kg (196 lb 10.4 oz)    Exam:   General:  Well nourished, calm comfortable  Cardiovascular: irregular, tachycardic, no m/g/r no LE edema  Respiratory: normal effort BS clear bilaterally no wheeze no rhonchi  Abdomen: soft non-distended +BS no guarding   Musculoskeletal: joints without swelling/erythema/ good muscle tone   Data Reviewed: Basic Metabolic Panel:  Recent Labs Lab 04/21/14 1115 04/21/14 1627 04/22/14 0549  NA 142  --  139  K 3.1*  --  4.3  CL 97  --  103  CO2 24  --  24  GLUCOSE 141*  --  85  BUN  8  --  7  CREATININE 0.83  --  0.69  CALCIUM 9.2  --  8.1*  MG  --  1.2*  --    Liver Function Tests: No results found for this basename: AST, ALT, ALKPHOS, BILITOT, PROT, ALBUMIN,  in the last 168 hours No results found for this basename: LIPASE, AMYLASE,  in the last 168 hours No results found for this basename: AMMONIA,  in the last 168 hours CBC:  Recent Labs Lab 04/21/14 1115 04/22/14 0549  WBC 14.6* 6.5  NEUTROABS 10.5*  --   HGB 18.0* 15.5  HCT 51.6 45.4  MCV 94.9 95.8  PLT 226 144*   Cardiac Enzymes:  Recent Labs Lab 04/21/14 1115  TROPONINI <0.30   BNP (last 3 results) No results found for this basename: PROBNP,  in the last 8760 hours CBG: No results found for this basename: GLUCAP,  in the last 168 hours  Recent Results (from the past 240 hour(s))  MRSA PCR SCREENING     Status: Abnormal   Collection Time    04/21/14  3:42 PM      Result Value Ref Range Status   MRSA by PCR POSITIVE (*) NEGATIVE Final   Comment:            The GeneXpert MRSA Assay (FDA     approved for NASAL specimens     only), is one component of a     comprehensive MRSA colonization     surveillance program.  It is not     intended to diagnose MRSA     infection nor to guide or     monitor treatment for     MRSA infections.     RESULT CALLED TO, READ BACK BY AND VERIFIED WITH:     [place] ON 04/21/2014 BY BAUGHAM,M.     Studies: Dg Chest Portable 1 View  04/21/2014   CLINICAL DATA:  Shortness of breath and tachycardia; history of atrial fibrillation and complete transposition of the great vessels treated with surgery as a child; history of rod and spot fever in May of 2015  EXAM: PORTABLE CHEST - 1 VIEW  COMPARISON:  Portable chest x-ray of December 29, 2013  FINDINGS: The current exam is taken in a somewhat lordotic projection. The lungs are adequately inflated. There are no alveolar infiltrates. The interstitial markings remain coarse bilaterally. The cardiopericardial  silhouette is top-normal in size. There is no pleural effusion.  IMPRESSION: Mild pulmonary interstitial edema is suspected. There is no alveolar edema nor pleural effusion. A PA and lateral chest x-ray would be useful when the patient can tolerate the procedure.   Electronically Signed   By: David  Martinique   On: 04/21/2014 12:14    Scheduled Meds: . antiseptic oral rinse  7 mL Mouth Rinse BID  . busPIRone  15 mg Oral BID  . Chlorhexidine Gluconate Cloth  6 each Topical Q0600  . diltiazem  180 mg Oral Q12H  . FLUoxetine  20 mg Oral Daily  . folic acid  1 mg Oral Daily  . furosemide  20 mg Oral Daily  . losartan  50 mg Oral Daily  . metoprolol  100 mg Oral BID  . multivitamin with minerals  1 tablet Oral Daily  . mupirocin ointment  1 application Nasal BID  . pantoprazole  40 mg Oral Daily  . potassium chloride SA  20 mEq Oral Daily  . QUEtiapine  50 mg Oral QHS  . sodium chloride  3 mL Intravenous Q12H  . thiamine  100 mg Oral Daily   Or  . thiamine  100 mg Intravenous Daily  . warfarin  10 mg Oral Once  . Warfarin - Pharmacist Dosing Inpatient   Does not apply q1800   Continuous Infusions: . sodium chloride 75 mL/hr at 04/22/14 0500    Principal Problem:   Atrial flutter Active Problems:   HYPERTENSION, BENIGN   Hypokalemia   Alcohol abuse   Pulmonary hypertension   Chronic systolic CHF (congestive heart failure)   A-fib    Time spent: 35 minutes    [facility] Pager [phone]. If 7PM-7AM, please contact night-coverage at [email or web address], password [facility] 04/22/2014, 10:56 AM  LOS: 1 day

## 2014-04-22 NOTE — Care Management Note (Signed)
    Page 1 of 1   04/22/2014     3:28:06 PM CARE MANAGEMENT NOTE 04/22/2014  Patient:  Matthew [name], Matthew [name]   Account Number:  [id number]  Date Initiated:  04/22/2014  Documentation initiated by:  Theophilus Kinds  Subjective/Objective Assessment:   Pt admitted from home with a flutter and etoh abuse. Pt lives with his children and will return home at discharge. Pt is independent with ADL's.     Action/Plan:   No CM needs noted.  CSW is aware of etoh abuse.   Anticipated DC Date:  04/24/2014   Anticipated DC Plan:  HOME/SELF CARE  In-house referral  Clinical Social Worker      DC Planning Services  CM consult      Choice offered to / List presented to:             Status of service:  Completed, signed off Medicare Important Message given?   (If response is "NO", the following Medicare IM given date fields will be blank) Date Medicare IM given:   Medicare IM given by:   Date Additional Medicare IM given:   Additional Medicare IM given by:    Discharge Disposition:  HOME/SELF CARE  Per UR Regulation:    If discussed at Long Length of Stay Meetings, dates discussed:    Comments:  04/22/14 [place], RN BSN CM

## 2014-04-22 NOTE — Progress Notes (Signed)
Patient seen and examined. Agree with above note. Diltiazem drip was discontinued due to low blood pressure. Heart rate remains controlled. Remains on an oral beta blocker and diltiazem. Appreciate cardiology recommendations can likely discharge home tomorrow as long as heart rate and blood pressure remain within adequate limits. Will need to followup with him for his ablation procedure.  Domingo Mend, MD [facility] Pager: [phone]

## 2014-04-22 NOTE — Progress Notes (Signed)
The patient was seen and examined, and I agree with the assessment and plan as documented above, with modifications as noted below. Pt feeling much better with improved appetite. No complaints of dizziness.  Assessment and plan: 1. Atypical atrial flutter with RVR: Diltiazem drip held due to low BP, as was last night's dose of metoprolol. Appetite has improved and receiving IV fluids. I suspect his current exacerbation is a result of ETOH abuse and missing doses of medications. See Dr. Olin Pia note dated 12/30/13 for rationale regarding warfarin over NOAC (no data to support NOAC use in this context). Would continue metoprolol 100 mg bid and diltiazem 180 mg bid (as per home regimen) as BP tolerates. BP should improve with adequate fluid repletion.  He will need to f/u at [facility] for ablation which was previously arranged, now that he has insurance.  2. Acute on chronic systolic heart failure: Due to rapid atypical atrial flutter. Lungs now clear. Continue oral Lasix.  3. Essential HTN: Hypotensive overnight and this morning. Continue to monitor for now.  4. Alcohol abuse: On alcohol withdrawal protocol given h/o DT's in past. Cessation counseling provided.

## 2014-04-23 LAB — BASIC METABOLIC PANEL
Anion gap: 12 (ref 5–15)
BUN: 9 mg/dL (ref 6–23)
CO2: 24 mEq/L (ref 19–32)
Calcium: 8.2 mg/dL — ABNORMAL LOW (ref 8.4–10.5)
Chloride: 102 mEq/L (ref 96–112)
Creatinine, Ser: 0.76 mg/dL (ref 0.50–1.35)
GFR calc Af Amer: >90 mL/min (ref >90)
GFR calc non Af Amer: >90 mL/min (ref >90)
Glucose, Bld: 107 mg/dL — ABNORMAL HIGH (ref 70–99)
Potassium: 4 mEq/L (ref 3.7–5.3)
Sodium: 138 mEq/L (ref 137–147)

## 2014-04-23 LAB — PROTIME-INR
INR: 1.47 (ref 0.00–1.49)
Prothrombin Time: 17.8 seconds — ABNORMAL HIGH (ref 11.6–15.2)

## 2014-04-23 LAB — CBC
HCT: 44.3 % (ref 39.0–52.0)
Hemoglobin: 15.2 g/dL (ref 13.0–17.0)
MCH: 32.8 pg (ref 26.0–34.0)
MCHC: 34.3 g/dL (ref 30.0–36.0)
MCV: 95.7 fL (ref 78.0–100.0)
Platelets: 144 10*3/uL — ABNORMAL LOW (ref 150–400)
RBC: 4.63 MIL/uL (ref 4.22–5.81)
RDW: 14.4 % (ref 11.5–15.5)
WBC: 8.4 10*3/uL (ref 4.0–10.5)

## 2014-04-23 MED ORDER — WARFARIN SODIUM 7.5 MG PO TABS: 7.5000 mg | ORAL_TABLET | Freq: Every day | ORAL | Status: DC

## 2014-04-23 MED ORDER — WARFARIN SODIUM 10 MG PO TABS: 10.0000 mg | ORAL_TABLET | Freq: Once | ORAL | Status: DC

## 2014-04-23 MED ORDER — THIAMINE HCL 100 MG PO TABS
100.0000 mg | ORAL_TABLET | Freq: Every day | ORAL | Status: DC
Start: 2014-04-23 — End: 2015-06-14

## 2014-04-23 MED ORDER — ADULT MULTIVITAMIN W/MINERALS CH: 1.0000 | ORAL_TABLET | Freq: Every day | ORAL | Status: DC

## 2014-04-23 MED ORDER — DILTIAZEM HCL ER 90 MG PO CP12: 180.0000 mg | ORAL_CAPSULE | Freq: Two times a day (BID) | ORAL | Status: DC

## 2014-04-23 MED ORDER — LOSARTAN POTASSIUM 50 MG PO TABS
50.0000 mg | ORAL_TABLET | Freq: Every day | ORAL | Status: DC
Start: 2014-04-23 — End: 2014-08-05

## 2014-04-23 MED ORDER — METOPROLOL TARTRATE 100 MG PO TABS: 100.0000 mg | ORAL_TABLET | Freq: Two times a day (BID) | ORAL | Status: DC

## 2014-04-23 NOTE — Progress Notes (Signed)
Pt discharged to home. Left floor with NT via wheelchair. Belongings sent with patient.

## 2014-04-23 NOTE — Discharge Summary (Signed)
Physician Discharge Summary  Matthew [name] QAS:[id number] DOB: 1976-03-02 DOA: 04/21/2014  PCP: Matthew Herring., MD  Admit date: 04/21/2014 Discharge date: 04/23/2014  Time spent: 45 minutes  Recommendations for Outpatient Follow-up:  -Will be discharged home today. -Has an appointment in 4 days with his PCP for a coumadin check. -Advised to follow up with his cardiologist as scheduled and to reschedule his ablation procedure at [facility].   Discharge Diagnoses:  Principal Problem:   Atrial flutter Active Problems:   HYPERTENSION, BENIGN   Hypokalemia   Alcohol abuse   Pulmonary hypertension   Chronic systolic CHF (congestive heart failure)   A-fib   Discharge Condition: Stable and improved  Filed Weights   04/21/14 1104 04/21/14 1536 04/22/14 0500  Weight: 90.719 kg (200 lb) 89.2 kg (196 lb 10.4 oz) 89.2 kg (196 lb 10.4 oz)    History of present illness:  Matthew [name] is a 38 y.o. 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 Initial evaluation in the emergency department revealed fibrillation with rapid ventricular response rate of 188, hypoxia with oxygen saturation level 85% on room air.  Patient reports that this morning he awakened and initially felt palpitations. Associated symptoms include some nausea without vomiting and brief shortness of breath. He denied chest pain abdominal pain vomiting. He denied headache visual disturbances syncope or near-syncope. He reports compliance with his medications that include metoprolol diltiazem and Coumadin. He reports daily EtOH consumption with 6 beers consumed last night.  Lab work in the emergency department includes a complete metabolic panel significant for potassium level of 3.1 serum glucose 141. Complete blood count significant for WBCs of 14.6.  Initial troponin negative. Alcohol level less than 11. INR 1.0. Chest x-ray mild pulmonary interstitial edema is suspected. There is no alveolar edema nor pleural effusion. A PA and lateral chest x-ray would be  useful when the patient can tolerate the procedure. EKG with atrial tachycardia.  In the emergency department he was initially given adenosine and with no effect and was given a diltiazem bolus followed by a diltiazem drip. At the time of my exam his heart rate was between 110 and 120.  He is afebrile with a stable blood pressure and not hypoxic.      Hospital Course:   Atrial Flutter with RVR -Has been seen by EP, Dr. Caryl Comes, who recommended an ablation procedure at [facility] that he cancelled because of lack of insurance. He will reschedule now that his insurance has been reinstated. -HR remains around 90-110 on metoprolol 100 mg BID and diltiazem 180 mg BID. -BP stable. -Has been seen by cardiology. -Had been noncompliant with his medications due to finances and ETOH abuse, this probably caused his issues. -Have restarted warfarin for anticoagulation and he will follow up on Tuesday (4 days) for an INR check.  HTN -Was hypotensive while on diltiazem drip. -Now normotensive.  ETOH Abuse -No signs of withdrawal. -Counseled on cessation. -He seems ready to quit and states he will go to [employer].  Acute on Chronic Systolic CHF -Due to rapid a. Flutter. 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 -Lungs now clear. -Continue PO lasix.  Hypokalemia -Repleted.  Procedures:  None  Consultations: Cardiology Discharge Instructions  Discharge Instructions   Diet - low sodium heart healthy    Complete by:  As  directed      Discontinue IV    Complete by:  As directed      Increase activity slowly    Complete by:  As directed             Medication List         ALPRAZolam 0.5 MG tablet  Commonly known as:  XANAX    Take 1 tablet (0.5 mg total) by mouth 3 (three) times daily as needed for anxiety.     busPIRone 15 MG tablet  Commonly known as:  BUSPAR  Take 15 mg by mouth 2 (two) times daily.     diltiazem 90 MG 12 hr capsule  Commonly known as:  CARDIZEM SR  Take 2 capsules (180 mg total) by mouth every 12 (twelve) hours.     feeding supplement (ENSURE COMPLETE) Liqd  Take 237 mLs by mouth 2 (two) times daily between meals.     FLUoxetine 20 MG capsule  Commonly known as:  PROZAC  Take 20 mg by mouth daily.     folic acid 1 MG tablet  Commonly known as:  FOLVITE  Take 1 tablet (1 mg total) by mouth daily.     furosemide 20 MG tablet  Commonly known as:  LASIX  Take 1 tablet (20 mg total) by mouth daily.     losartan 50 MG tablet  Commonly known as:  COZAAR  Take 50 mg by mouth daily.     metoprolol 100 MG tablet  Commonly known as:  LOPRESSOR  Take 1 tablet (100 mg total) by mouth 2 (two) times daily.     multivitamin with minerals Tabs tablet  Take 1 tablet by mouth daily.     pantoprazole 40 MG tablet  Commonly known as:  PROTONIX  Take 40 mg by mouth daily.     potassium chloride SA 20 MEQ tablet  Commonly known as:  KLOR-CON M20  Take 1 tablet (20 mEq total) by mouth daily.     QUEtiapine 50 MG Tb24 24 hr tablet  Commonly known as:  SEROQUEL XR  Take 1 tablet (50 mg total) by mouth at bedtime.     thiamine 100 MG tablet  Take 1 tablet (100 mg total) by mouth daily.     warfarin 7.5 MG tablet  Commonly known as:  COUMADIN  Take 1 tablet (7.5 mg total) by mouth daily at 6 PM.       Allergies  Allergen Reactions  . Aspirin        Follow-up Information   Follow up with Matthew Herring., MD. Schedule an appointment as soon as possible for a visit in 2 weeks.   Specialty:  Internal Medicine   Contact information:   [address] [place] [address] [phone]        The results of significant diagnostics from this hospitalization (including  imaging, microbiology, ancillary and laboratory) are listed below for reference.    Significant Diagnostic Studies: Dg Chest Portable 1 View  04/21/2014   CLINICAL DATA:  Shortness of breath and tachycardia; history of atrial fibrillation and complete transposition of the great vessels treated with surgery as a child; history of rod and spot fever in May of 2015  EXAM: PORTABLE CHEST - 1 VIEW  COMPARISON:  Portable chest x-ray of December 29, 2013  FINDINGS: The current exam is taken in a somewhat lordotic projection. The lungs are adequately inflated. There are no alveolar infiltrates. The interstitial markings remain coarse bilaterally. The cardiopericardial  silhouette is top-normal in size. There is no pleural effusion.  IMPRESSION: Mild pulmonary interstitial edema is suspected. There is no alveolar edema nor pleural effusion. A PA and lateral chest x-ray would be useful when the patient can tolerate the procedure.   Electronically Signed   By: David  Martinique   On: 04/21/2014 12:14    Microbiology: Recent Results (from the past 240 hour(s))  MRSA PCR SCREENING     Status: Abnormal   Collection Time    04/21/14  3:42 PM      Result Value Ref Range Status   MRSA by PCR POSITIVE (*) NEGATIVE Final   Comment:            The GeneXpert MRSA Assay (FDA     approved for NASAL specimens     only), is one component of a     comprehensive MRSA colonization     surveillance program. It is not     intended to diagnose MRSA     infection nor to guide or     monitor treatment for     MRSA infections.     RESULT CALLED TO, READ BACK BY AND VERIFIED WITH:     [place] ON 04/21/2014 BY BAUGHAM,M.     Labs: Basic Metabolic Panel:  Recent Labs Lab 04/21/14 1115 04/21/14 1627 04/22/14 0549 04/23/14 0633  NA 142  --  139 138  K 3.1*  --  4.3 4.0  CL 97  --  103 102  CO2 24  --  24 24  GLUCOSE 141*  --  85 107*  BUN 8  --  7 9  CREATININE 0.83  --  0.69 0.76  CALCIUM 9.2  --  8.1* 8.2*   MG  --  1.2*  --   --    Liver Function Tests: No results found for this basename: AST, ALT, ALKPHOS, BILITOT, PROT, ALBUMIN,  in the last 168 hours No results found for this basename: LIPASE, AMYLASE,  in the last 168 hours No results found for this basename: AMMONIA,  in the last 168 hours CBC:  Recent Labs Lab 04/21/14 1115 04/22/14 0549 04/23/14 0633  WBC 14.6* 6.5 8.4  NEUTROABS 10.5*  --   --   HGB 18.0* 15.5 15.2  HCT 51.6 45.4 44.3  MCV 94.9 95.8 95.7  PLT 226 144* 144*   Cardiac Enzymes:  Recent Labs Lab 04/21/14 1115  TROPONINI <0.30   BNP: BNP (last 3 results) No results found for this basename: PROBNP,  in the last 8760 hours CBG: No results found for this basename: GLUCAP,  in the last 168 hours     Signed:  Lelon Frohlich  [facility] Pager: [phone] 04/23/2014, 10:13 AM

## 2014-04-23 NOTE — Progress Notes (Signed)
[place] [facility] [place] Indication: Atrial Flutter  Allergies  Allergen Reactions  . Aspirin     Patient Measurements: Height: 6\' 3"  (190.5 cm) Weight: 196 lb 10.4 oz (89.2 kg) IBW/kg (Calculated) : 84.5  Vital Signs: Temp: 98.4 F (36.9 C) (10/10 0800) Temp Source: Oral (10/10 0800) BP: 102/66 mmHg (10/10 0800) Pulse Rate: 111 (10/10 0800)  Labs:  Recent Labs  04/21/14 1115 04/21/14 1320 04/22/14 0549 04/23/14 0633  HGB 18.0*  --  15.5 15.2  HCT 51.6  --  45.4 44.3  PLT 226  --  144* 144*  LABPROT  --  13.2 14.9 17.8*  INR  --  1.00 1.17 1.47  CREATININE 0.83  --  0.69 0.76  TROPONINI <0.30  --   --   --     Estimated Creatinine Clearance: 152.6 ml/min (by C-G formula based on Cr of 0.76).   Medical History: Past Medical History  Diagnosis Date  . Complete transposition of great vessels   . Hypertension   . Dyslipidemia   . Depression   . Alcohol abuse   . [place] spotted fever 11/2013    Medications:  Scheduled:  . antiseptic oral rinse  7 mL Mouth Rinse BID  . busPIRone  15 mg Oral BID  . Chlorhexidine Gluconate Cloth  6 each Topical Q0600  . diltiazem  180 mg Oral Q12H  . FLUoxetine  20 mg Oral Daily  . folic acid  1 mg Oral Daily  . furosemide  20 mg Oral Daily  . losartan  50 mg Oral Daily  . metoprolol  100 mg Oral BID  . multivitamin with minerals  1 tablet Oral Daily  . mupirocin ointment  1 application Nasal BID  . pantoprazole  40 mg Oral Daily  . potassium chloride SA  20 mEq Oral Daily  . QUEtiapine  50 mg Oral QHS  . sodium chloride  3 mL Intravenous Q12H  . thiamine  100 mg Oral Daily   Or  . thiamine  100 mg Intravenous Daily  . Warfarin - Pharmacist Dosing Inpatient   Does not apply q1800    Assessment: 28 yoM on chronic Coumadin for hx Afib.   INR at baseline on admission.  Patient states he had not taken Coumadin in >1week due to drinking binge.  He has 7.5mg  tablets at home.  He  expresses his INR has been difficult to regulate due to his drinking and his dose changes "on a week to week basis."   No bleeding noted.   Goal of Therapy:  INR 2-3   Plan:  Coumadin 10 mg po x 1 dose tonight INR daily  Nalaya Wojdyla, Lavonia Drafts 04/23/2014,9:06 AM

## 2014-06-06 ENCOUNTER — Other Ambulatory Visit: Payer: Self-pay | Admitting: Internal Medicine

## 2014-07-25 ENCOUNTER — Other Ambulatory Visit: Payer: Self-pay | Admitting: Internal Medicine

## 2014-07-28 ENCOUNTER — Inpatient Hospital Stay (HOSPITAL_COMMUNITY)
Admission: EM | Admit: 2014-07-28 | Discharge: 2014-08-05 | DRG: 309 | Disposition: A | Discharge status: Home / Self Care | Payer: Self-pay | Attending: Family Medicine | Admitting: Family Medicine

## 2014-07-28 ENCOUNTER — Emergency Department (HOSPITAL_COMMUNITY): Payer: Self-pay

## 2014-07-28 ENCOUNTER — Encounter (HOSPITAL_COMMUNITY): Payer: Self-pay | Admitting: Emergency Medicine

## 2014-07-28 DIAGNOSIS — I429 Cardiomyopathy, unspecified: Secondary | ICD-10-CM | POA: Diagnosis present

## 2014-07-28 DIAGNOSIS — F10239 Alcohol dependence with withdrawal, unspecified: Secondary | ICD-10-CM | POA: Diagnosis present

## 2014-07-28 DIAGNOSIS — I10 Essential (primary) hypertension: Secondary | ICD-10-CM | POA: Diagnosis present

## 2014-07-28 DIAGNOSIS — F10231 Alcohol dependence with withdrawal delirium: Secondary | ICD-10-CM | POA: Diagnosis not present

## 2014-07-28 DIAGNOSIS — R740 Nonspecific elevation of levels of transaminase and lactic acid dehydrogenase [LDH]: Secondary | ICD-10-CM

## 2014-07-28 DIAGNOSIS — R7401 Elevation of levels of liver transaminase levels: Secondary | ICD-10-CM | POA: Insufficient documentation

## 2014-07-28 DIAGNOSIS — F329 Major depressive disorder, single episode, unspecified: Secondary | ICD-10-CM | POA: Diagnosis present

## 2014-07-28 DIAGNOSIS — F419 Anxiety disorder, unspecified: Secondary | ICD-10-CM | POA: Diagnosis present

## 2014-07-28 DIAGNOSIS — R0902 Hypoxemia: Secondary | ICD-10-CM

## 2014-07-28 DIAGNOSIS — R197 Diarrhea, unspecified: Secondary | ICD-10-CM | POA: Diagnosis present

## 2014-07-28 DIAGNOSIS — F418 Other specified anxiety disorders: Secondary | ICD-10-CM | POA: Diagnosis present

## 2014-07-28 DIAGNOSIS — Q203 Discordant ventriculoarterial connection: Secondary | ICD-10-CM

## 2014-07-28 DIAGNOSIS — Z8774 Personal history of (corrected) congenital malformations of heart and circulatory system: Secondary | ICD-10-CM

## 2014-07-28 DIAGNOSIS — E440 Moderate protein-calorie malnutrition: Secondary | ICD-10-CM | POA: Diagnosis present

## 2014-07-28 DIAGNOSIS — I471 Supraventricular tachycardia, unspecified: Secondary | ICD-10-CM

## 2014-07-28 DIAGNOSIS — D539 Nutritional anemia, unspecified: Secondary | ICD-10-CM | POA: Diagnosis present

## 2014-07-28 DIAGNOSIS — Z139 Encounter for screening, unspecified: Secondary | ICD-10-CM

## 2014-07-28 DIAGNOSIS — Z6824 Body mass index (BMI) 24.0-24.9, adult: Secondary | ICD-10-CM

## 2014-07-28 DIAGNOSIS — R000 Tachycardia, unspecified: Secondary | ICD-10-CM | POA: Diagnosis present

## 2014-07-28 DIAGNOSIS — I484 Atypical atrial flutter: Principal | ICD-10-CM | POA: Diagnosis present

## 2014-07-28 DIAGNOSIS — I5022 Chronic systolic (congestive) heart failure: Secondary | ICD-10-CM | POA: Diagnosis present

## 2014-07-28 DIAGNOSIS — I272 Other secondary pulmonary hypertension: Secondary | ICD-10-CM | POA: Diagnosis present

## 2014-07-28 DIAGNOSIS — G4733 Obstructive sleep apnea (adult) (pediatric): Secondary | ICD-10-CM | POA: Diagnosis present

## 2014-07-28 DIAGNOSIS — D696 Thrombocytopenia, unspecified: Secondary | ICD-10-CM | POA: Diagnosis present

## 2014-07-28 DIAGNOSIS — Z7901 Long term (current) use of anticoagulants: Secondary | ICD-10-CM

## 2014-07-28 DIAGNOSIS — E860 Dehydration: Secondary | ICD-10-CM | POA: Diagnosis present

## 2014-07-28 DIAGNOSIS — Y901 Blood alcohol level of 20-39 mg/100 ml: Secondary | ICD-10-CM | POA: Diagnosis present

## 2014-07-28 DIAGNOSIS — E785 Hyperlipidemia, unspecified: Secondary | ICD-10-CM | POA: Diagnosis present

## 2014-07-28 DIAGNOSIS — Z9114 Patient's other noncompliance with medication regimen: Secondary | ICD-10-CM | POA: Diagnosis present

## 2014-07-28 DIAGNOSIS — D7589 Other specified diseases of blood and blood-forming organs: Secondary | ICD-10-CM | POA: Diagnosis present

## 2014-07-28 DIAGNOSIS — F10939 Alcohol use, unspecified with withdrawal, unspecified: Secondary | ICD-10-CM

## 2014-07-28 DIAGNOSIS — R509 Fever, unspecified: Secondary | ICD-10-CM

## 2014-07-28 DIAGNOSIS — E876 Hypokalemia: Secondary | ICD-10-CM | POA: Diagnosis present

## 2014-07-28 DIAGNOSIS — K7010 Alcoholic hepatitis without ascites: Secondary | ICD-10-CM | POA: Diagnosis present

## 2014-07-28 LAB — ETHANOL: Alcohol, Ethyl (B): 23 mg/dL — ABNORMAL HIGH (ref 0–9)

## 2014-07-28 LAB — RAPID URINE DRUG SCREEN, HOSP PERFORMED
Amphetamines: NOT DETECTED
Barbiturates: NOT DETECTED
Benzodiazepines: POSITIVE — AB
Cocaine: NOT DETECTED
Opiates: NOT DETECTED
Tetrahydrocannabinol: NOT DETECTED

## 2014-07-28 LAB — CBC WITH DIFFERENTIAL/PLATELET
Basophils Absolute: 0 K/uL (ref 0.0–0.1)
Basophils Relative: 0 % (ref 0–1)
Eosinophils Absolute: 0 K/uL (ref 0.0–0.7)
Eosinophils Relative: 0 % (ref 0–5)
HCT: 39 % (ref 39.0–52.0)
Hemoglobin: 13.8 g/dL (ref 13.0–17.0)
Lymphocytes Relative: 14 % (ref 12–46)
Lymphs Abs: 0.9 K/uL (ref 0.7–4.0)
MCH: 36.3 pg — ABNORMAL HIGH (ref 26.0–34.0)
MCHC: 35.4 g/dL (ref 30.0–36.0)
MCV: 102.6 fL — ABNORMAL HIGH (ref 78.0–100.0)
Monocytes Absolute: 0.6 K/uL (ref 0.1–1.0)
Monocytes Relative: 10 % (ref 3–12)
Neutro Abs: 4.9 K/uL (ref 1.7–7.7)
Neutrophils Relative %: 76 % (ref 43–77)
Platelets: 173 K/uL (ref 150–400)
RBC: 3.8 MIL/uL — ABNORMAL LOW (ref 4.22–5.81)
RDW: 16.3 % — ABNORMAL HIGH (ref 11.5–15.5)
WBC: 6.4 K/uL (ref 4.0–10.5)

## 2014-07-28 LAB — HEPATIC FUNCTION PANEL
ALT: 107 U/L — ABNORMAL HIGH (ref 0–53)
AST: 211 U/L — ABNORMAL HIGH (ref 0–37)
Albumin: 2.9 g/dL — ABNORMAL LOW (ref 3.5–5.2)
Alkaline Phosphatase: 85 U/L (ref 39–117)
Bilirubin, Direct: 0.5 mg/dL — ABNORMAL HIGH (ref 0.0–0.3)
Indirect Bilirubin: 0.8 mg/dL (ref 0.3–0.9)
Total Bilirubin: 1.3 mg/dL — ABNORMAL HIGH (ref 0.3–1.2)
Total Protein: 6.2 g/dL (ref 6.0–8.3)

## 2014-07-28 LAB — BASIC METABOLIC PANEL WITH GFR
Anion gap: 12 (ref 5–15)
BUN: <5 mg/dL — ABNORMAL LOW (ref 6–23)
CO2: 25 mmol/L (ref 19–32)
Calcium: 6.2 mg/dL — CL (ref 8.4–10.5)
Chloride: 105 meq/L (ref 96–112)
Creatinine, Ser: 0.92 mg/dL (ref 0.50–1.35)
GFR calc Af Amer: >90 mL/min (ref >90)
GFR calc non Af Amer: >90 mL/min (ref >90)
Glucose, Bld: 117 mg/dL — ABNORMAL HIGH (ref 70–99)
Potassium: 3 mmol/L — ABNORMAL LOW (ref 3.5–5.1)
Sodium: 142 mmol/L (ref 135–145)

## 2014-07-28 LAB — MAGNESIUM: Magnesium: 0.6 mg/dL — CL (ref 1.5–2.5)

## 2014-07-28 LAB — I-STAT TROPONIN, ED: Troponin i, poc: 0 ng/mL (ref 0.00–0.08)

## 2014-07-28 LAB — PROTIME-INR
INR: 1.04 (ref 0.00–1.49)
Prothrombin Time: 13.7 seconds (ref 11.6–15.2)

## 2014-07-28 MED ORDER — MAGNESIUM SULFATE 2 GM/50ML IV SOLN
2.0000 g | INTRAVENOUS | Status: AC
  Administered 2014-07-28: 2 g via INTRAVENOUS
  Filled 2014-07-28: qty 50

## 2014-07-28 MED ORDER — LORAZEPAM 1 MG PO TABS
0.0000 mg | ORAL_TABLET | Freq: Four times a day (QID) | ORAL | Status: DC
  Administered 2014-07-28 (×2): 1 mg via ORAL
  Filled 2014-07-28 (×2): qty 1

## 2014-07-28 MED ORDER — FLUOXETINE HCL 20 MG PO CAPS
20.0000 mg | ORAL_CAPSULE | Freq: Every day | ORAL | Status: DC
  Administered 2014-07-28 – 2014-07-31 (×4): 20 mg via ORAL
  Filled 2014-07-28 (×5): qty 1

## 2014-07-28 MED ORDER — INFLUENZA VAC SPLIT QUAD 0.5 ML IM SUSY
0.5000 mL | PREFILLED_SYRINGE | INTRAMUSCULAR | Status: AC
  Administered 2014-07-29: 0.5 mL via INTRAMUSCULAR
  Filled 2014-07-28: qty 0.5

## 2014-07-28 MED ORDER — SODIUM CHLORIDE 0.9 % IJ SOLN
3.0000 mL | Freq: Two times a day (BID) | INTRAMUSCULAR | Status: DC
  Administered 2014-07-29 – 2014-08-05 (×14): 3 mL via INTRAVENOUS

## 2014-07-28 MED ORDER — LORAZEPAM 2 MG/ML IJ SOLN
2.0000 mg | INTRAMUSCULAR | Status: DC | PRN
  Administered 2014-07-29 (×2): 2 mg via INTRAVENOUS
  Administered 2014-07-29: 3 mg via INTRAVENOUS
  Administered 2014-07-29: 2 mg via INTRAVENOUS
  Administered 2014-07-29: 3 mg via INTRAVENOUS
  Administered 2014-07-29 – 2014-07-30 (×6): 2 mg via INTRAVENOUS
  Administered 2014-08-01: 3 mg via INTRAVENOUS
  Filled 2014-07-28: qty 1
  Filled 2014-07-28 (×2): qty 2
  Filled 2014-07-28 (×2): qty 1
  Filled 2014-07-28: qty 2
  Filled 2014-07-28: qty 1
  Filled 2014-07-28: qty 2
  Filled 2014-07-28 (×5): qty 1

## 2014-07-28 MED ORDER — ADULT MULTIVITAMIN W/MINERALS CH
1.0000 | ORAL_TABLET | Freq: Every day | ORAL | Status: DC
  Administered 2014-07-28 – 2014-07-31 (×4): 1 via ORAL
  Filled 2014-07-28 (×5): qty 1

## 2014-07-28 MED ORDER — QUETIAPINE FUMARATE ER 50 MG PO TB24
50.0000 mg | ORAL_TABLET | Freq: Every day | ORAL | Status: DC
  Administered 2014-07-28 – 2014-07-31 (×4): 50 mg via ORAL
  Filled 2014-07-28 (×5): qty 1

## 2014-07-28 MED ORDER — WARFARIN SODIUM 7.5 MG PO TABS
7.5000 mg | ORAL_TABLET | Freq: Once | ORAL | Status: AC
  Administered 2014-07-28: 7.5 mg via ORAL
  Filled 2014-07-28: qty 1

## 2014-07-28 MED ORDER — LORAZEPAM 1 MG PO TABS: 0.0000 mg | ORAL_TABLET | Freq: Two times a day (BID) | ORAL | Status: DC

## 2014-07-28 MED ORDER — DILTIAZEM HCL 100 MG IV SOLR
5.0000 mg/h | INTRAVENOUS | Status: DC
  Administered 2014-07-28: 5 mg/h via INTRAVENOUS
  Filled 2014-07-28: qty 100

## 2014-07-28 MED ORDER — ONDANSETRON HCL 4 MG PO TABS: 4.0000 mg | ORAL_TABLET | Freq: Four times a day (QID) | ORAL | Status: DC | PRN

## 2014-07-28 MED ORDER — PANTOPRAZOLE SODIUM 40 MG PO TBEC
40.0000 mg | DELAYED_RELEASE_TABLET | Freq: Every day | ORAL | Status: DC
  Administered 2014-07-28 – 2014-07-31 (×4): 40 mg via ORAL
  Filled 2014-07-28 (×4): qty 1

## 2014-07-28 MED ORDER — SODIUM CHLORIDE 0.9 % IV SOLN
INTRAVENOUS | Status: DC
  Administered 2014-07-28: 21:00:00 via INTRAVENOUS

## 2014-07-28 MED ORDER — VITAMIN B-1 100 MG PO TABS
100.0000 mg | ORAL_TABLET | Freq: Every day | ORAL | Status: DC
  Administered 2014-07-28: 100 mg via ORAL
  Filled 2014-07-28: qty 1

## 2014-07-28 MED ORDER — POTASSIUM CHLORIDE 10 MEQ/100ML IV SOLN
10.0000 meq | INTRAVENOUS | Status: AC
  Administered 2014-07-28 (×2): 10 meq via INTRAVENOUS
  Filled 2014-07-28 (×2): qty 100

## 2014-07-28 MED ORDER — ONDANSETRON HCL 4 MG/2ML IJ SOLN
4.0000 mg | Freq: Four times a day (QID) | INTRAMUSCULAR | Status: DC | PRN
Start: 2014-07-28 — End: 2014-08-05

## 2014-07-28 MED ORDER — FOLIC ACID 1 MG PO TABS
1.0000 mg | ORAL_TABLET | Freq: Every day | ORAL | Status: DC
  Administered 2014-07-28 – 2014-07-31 (×4): 1 mg via ORAL
  Filled 2014-07-28 (×5): qty 1

## 2014-07-28 MED ORDER — HEPARIN SODIUM (PORCINE) 5000 UNIT/ML IJ SOLN
5000.0000 [IU] | Freq: Three times a day (TID) | INTRAMUSCULAR | Status: DC
  Administered 2014-07-28 – 2014-07-31 (×8): 5000 [IU] via SUBCUTANEOUS
  Filled 2014-07-28 (×13): qty 1

## 2014-07-28 MED ORDER — CALCIUM CARBONATE ANTACID 500 MG PO CHEW
1.0000 | CHEWABLE_TABLET | Freq: Two times a day (BID) | ORAL | Status: DC
  Administered 2014-07-28 – 2014-07-31 (×7): 200 mg via ORAL
  Filled 2014-07-28 (×9): qty 1

## 2014-07-28 MED ORDER — THIAMINE HCL 100 MG/ML IJ SOLN: 100.0000 mg | Freq: Every day | INTRAMUSCULAR | Status: DC

## 2014-07-28 MED ORDER — VITAMIN B-1 100 MG PO TABS
100.0000 mg | ORAL_TABLET | Freq: Every day | ORAL | Status: DC
  Administered 2014-07-28 – 2014-07-31 (×4): 100 mg via ORAL
  Filled 2014-07-28 (×5): qty 1

## 2014-07-28 MED ORDER — SODIUM CHLORIDE 0.9 % IV SOLN
2.0000 g | Freq: Once | INTRAVENOUS | Status: AC
  Administered 2014-07-28: 2 g via INTRAVENOUS
  Filled 2014-07-28 (×2): qty 20

## 2014-07-28 MED ORDER — POTASSIUM CHLORIDE 10 MEQ/100ML IV SOLN
10.0000 meq | INTRAVENOUS | Status: AC
  Administered 2014-07-28 – 2014-07-29 (×6): 10 meq via INTRAVENOUS
  Filled 2014-07-28 (×2): qty 100

## 2014-07-28 MED ORDER — BUSPIRONE HCL 15 MG PO TABS
15.0000 mg | ORAL_TABLET | Freq: Two times a day (BID) | ORAL | Status: DC
  Administered 2014-07-28 – 2014-07-31 (×7): 15 mg via ORAL
  Filled 2014-07-28 (×9): qty 1

## 2014-07-28 MED ORDER — SODIUM CHLORIDE 0.9 % IV BOLUS (SEPSIS)
1000.0000 mL | Freq: Once | INTRAVENOUS | Status: AC
  Administered 2014-07-28: 1000 mL via INTRAVENOUS

## 2014-07-28 NOTE — H&P (Signed)
[facility] Admission History and Physical Service Pager: [phone]  Patient name: Matthew [name] Medical record number: [id number] Date of birth: 04-08-1976 Age: 39 y.o. Gender: male  Primary Care Provider: Glo Herring., MD Consultants: Cardiology Code Status: Full  Chief Complaint: fast heart rate  Assessment and Plan: Matthew [name] is a 39 y.o. male presenting with Sinus tachycardia s/p SVT en route. PMH is significant for alcohol abuse, chronic systolic CHF,   Sinus tachycardia, s/p SVT: Reported to have been in SVT per EMS with HR 237.  S/p Adenosine 6mg  x2 and Diltiazem 20mg  x1.  Admits to missing a few days of home diltiazem. Currently in sinus tachycardia with HR in 110s. Denies CP/SOB, but some ischemic changes on EKG. Initial troponin neg.  CXR clear.  Likely related to missing medications and alcohol intake. - Admit to SDU under [place], attending Mingo Amber - Start Diltiazem drip O/N - Monitor on telemetry - Holding home metoprolol and diltiazem - will likely be able to resume after transitioning off dilt drip - Cycle troponins x2 - Repeat EKG in AM - Cardiology consulted, appreciate recs  Alcohol abuse, concern for withdrawal: h/o hallucinations with withdrawal in the past. Last drink around 4am today per patient report.  Patient endorses a recent 2-3 day alcohol binge with no PO intake. - Ethanol level pending - SDU CIWA protocol - continue home thiamine and MVI - SW consult for substance abuse - UDS pending - LFTs pending  Hypokalemia: K 3.0 on admission. Likely related to diarrhea and decreased PO intake in setting of alcohol abuse. - Potassium 42mEq IV q1h x6 - Repeat BMET at 2300  Hypocalcemia: Trousseaus sign on PE. Ca 6.2 on admission. Likely related to diarrhea and decreased PO intake in setting of alcohol abuse. - Check albumin for correction - Replete with 2g IV calcium gluconate and tums BID - Repeat BMET @  2300  Hypomagnesemia: Magnesium 0.6 on admission. Likely related to diarrhea and decreased PO intake in setting of alcohol abuse. - Replete with 2g of IV Mag - Repeat Mag level at 2300  Diarrhea: Likely related to alcohol use. No fevers. - c diff pcr - enteric precautions  Macrocytosis: MCV 102.6, no anemia. Likely related to alcoholism. - Can consider B12 and folate levels - will get b12 and folate supplementation while on CIWA  Chronic systolic CHF, h/o transposition of great vessels s/p Mustard procedure: Last Echo in 12/2013 showed moderate RVE (systemic ventricle) with severely reduced function, LVEF 45-50%. Followed by cardiology as OP. No signs of volume overload on exam. - Holding home Lasix in setting of dehydration - daily weights, strict Is&Os - Monitor fluid status carefully - Not currently on statin, likely related to elevated transaminases - risk stratification with A1c and lipid panel tomorrow AM  HTN: BPs soft in ED.  - Holding home metoprolol, losartan - Monitor BP closely  A flutter, on chronic anticoagulation: Missed several weeks of coumadin. INR 1.04. - Coumadin per pharm  Psych: Depression and anxiety - continue home Prozac, buspar, seroquel  FEN/GI: Heart healthy diet, NS @ 165mL/hr Prophylaxis: subq heparin will INR subtherapeutic  Disposition: Admit to SDU, FPTS, attending Mingo Amber. Dispo pending control of tachycardia and cardiology w/u.  History of Present Illness: Matthew [name] is a 39 y.o. male presenting with fast heart beat.  Felt heart rate was fast earlier this morning around 1030am. He has had no chest pain, some brief shortness of breath that has gotten better. Family called  EMS because he did not look well.   He says he has a history of aflutter. Hx of transposition of the great vessels repaired after birth.   Trying to quit drinking, "hitting it hard" over the past few days. States this is the second time he has had this happen.  Recently  lost his father and divorced wife. Last drink was around 4am today. Has had hallucinations in the past with withdrawals. Hasn't been taking his medications as he should.On coumadin since June, but missed 2-3 weeks recently.  Missed 2-3 doses of Diltiazem.  When EMS arrived, HR 237 in SVT, given adenosine 6mg  x2 and 20mg  of diltiazem.  Rate in 110s since then.  ED started K and Mag repletion.  Review Of Systems: Per HPI with the following additions: no changes in vision, no abdominal pain, no leg swelling, no melena/BRBPR/hematemesis. Otherwise 12 point review of systems was performed and was unremarkable.  Patient Active Problem List   Diagnosis Date Noted  . A-fib 04/21/2014  . Encounter for therapeutic drug monitoring 01/07/2014  . Hallucination 01/04/2014  . Transposition of great vessels 01/01/2014  . Chronic systolic CHF (congestive heart failure) 01/01/2014  . Pulmonary hypertension 12/31/2013  . Metabolic acidosis 09/40/7680  . Protein-calorie malnutrition, severe 12/30/2013  . Nausea vomiting and diarrhea 12/29/2013  . Atrial flutter 12/29/2013  . Hypokalemia 12/29/2013  . Hypomagnesemia 12/29/2013  . Alcohol abuse 12/29/2013  . Alcohol withdrawal 12/29/2013  . Dehydration with hyponatremia 12/29/2013  . Depression 12/29/2013  . Hyperglycemia 12/29/2013  . Melena 12/29/2013  . Elevated transaminase level 12/29/2013  . HYPERLIPIDEMIA-MIXED 04/21/2009  . HYPERTENSION, BENIGN 04/21/2009  . OTHER DISORDERS PAPILLARY MUSCLE 01/24/2009  . 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 Complete transposition of great vessels   . Hypertension   . Dyslipidemia   . Depression   . Alcohol abuse   . [facility] spotted fever 11/2013   Past Surgical History: Past Surgical History  Procedure Laterality Date  . Volar plate arthroplasty, right small finger proxima linterphalangeal joint    . Septostomy       rashkind  . Mustard procedure     Social History: History  Substance Use Topics  . Smoking status: Never Smoker   . Smokeless tobacco: Not on file  . Alcohol Use: Yes     Comment: daily   Additional social history: alcohol use during 1 week about 1 gallon, 2 5ths and pint of liquor a week. Drank 1 pint yesterday. No tob, drugs Please also refer to relevant sections of EMR.  Family History: Family History  Problem Relation Age of Onset  . Rheumatic fever Father   . Hypertension Father    Allergies and Medications: Allergies  Allergen Reactions  . Aspirin Other (See Comments)    Unknown   No current facility-administered medications on file prior to encounter.   Current Outpatient Prescriptions on File Prior to Encounter  Medication Sig Dispense Refill  . ALPRAZolam (XANAX) 0.5 MG tablet Take 1 tablet (0.5 mg total) by mouth 3 (three) times daily as needed for anxiety. (Patient taking differently: Take 0.5 mg by mouth 2 (two) times daily as needed for anxiety. ) 15 tablet 0  . busPIRone (BUSPAR) 15 MG tablet Take 15 mg by mouth 2 (two) times daily.    Marland Kitchen diltiazem (CARDIZEM SR) 90 MG 12 hr capsule TAKE TWO CAPSULES BY MOUTH EVERY 12 HOURS **NEEDS  APPOINTMENT** (Patient taking  differently: Take 180 mg by mouth every 12 hours) 120 capsule 0  . FLUoxetine (PROZAC) 20 MG capsule Take 20 mg by mouth daily.    . folic acid (FOLVITE) 1 MG tablet Take 1 tablet (1 mg total) by mouth daily.    Marland Kitchen losartan (COZAAR) 50 MG tablet Take 1 tablet (50 mg total) by mouth daily. 30 tablet 3  . metoprolol (LOPRESSOR) 100 MG tablet Take 1 tablet (100 mg total) by mouth 2 (two) times daily. 60 tablet 4  . Multiple Vitamin (MULTIVITAMIN WITH MINERALS) TABS tablet Take 1 tablet by mouth daily.    . pantoprazole (PROTONIX) 40 MG tablet Take 40 mg by mouth daily.    . potassium chloride SA (KLOR-CON M20) 20 MEQ tablet Take 1 tablet (20 mEq total) by mouth daily. 30 tablet 3  . QUEtiapine (SEROQUEL XR) 50  MG TB24 24 hr tablet Take 1 tablet (50 mg total) by mouth at bedtime. 30 each 0  . thiamine 100 MG tablet Take 1 tablet (100 mg total) by mouth daily. 30 tablet 11  . warfarin (COUMADIN) 7.5 MG tablet Take 1 tablet (7.5 mg total) by mouth daily at 6 PM. 30 tablet 1  . feeding supplement, ENSURE COMPLETE, (ENSURE COMPLETE) LIQD Take 237 mLs by mouth 2 (two) times daily between meals. (Patient not taking: Reported on 07/28/2014)    . furosemide (LASIX) 20 MG tablet Take 1 tablet (20 mg total) by mouth daily. (Patient not taking: Reported on 07/28/2014) 30 tablet 3    Objective: BP 90/71 mmHg  Pulse 113  Temp(Src) 98.4 F (36.9 C) (Oral)  Resp 22  SpO2 98% Exam: General: Tremulous male laying in bed, NAD HEENT: NCAT, slightly dry MM, PERRL, EOMI, OP clear, tremor of cheeks noted while opening mouth Cardiovascular: Tachycardic, regular rhythm, 2/6 systolic murmur at LUSB, no g/r. 2+ DP pulses b/l Respiratory: Normal WOB, CTAB, no w/r/c. Abdomen: Soft, NTND, no masses, rebound, guarding. +BS Extremities: WWP, no edema, cyanosis, clubbing. +trousseau's sign while BP cuff inflating Skin: No rashes or lesions Neuro: A&O x3, CN 2-12 grossly intact, no focal deficits, moves all extremities. Upper and lower extremity tremors noted  Labs and Imaging: CBC BMET   Recent Labs Lab 07/28/14 1428  WBC 6.4  HGB 13.8  HCT 39.0  PLT 173    Recent Labs Lab 07/28/14 1428  NA 142  K 3.0*  CL 105  CO2 25  BUN <5*  CREATININE 0.92  GLUCOSE 117*  CALCIUM 6.2*     Magnesium:  0.6  Troponin 0.00  INR 1.04   CXR (1/14): No active disease.  EKG (1/14): Sinus tachycardia, left axis deviation, RVH with repol abnormality, T wave inversion and ST depression in lateral leads   Lavon Paganini, MD 07/28/2014, 4:11 PM PGY-1, [place] Intern pager: [phone], text pages welcome  I have seen and examined the patient. I have read and agree with the above note. My changes  are noted in blue.  Tawanna Sat, MD 07/28/2014, 7:12 PM PGY-2, [place] [facility] Pager: [phone], text pages welcome

## 2014-07-28 NOTE — ED Provider Notes (Signed)
CSN: [id number]     Arrival date & time 07/28/14  1343 History   First MD Initiated Contact with Patient 07/28/14 1348     Chief Complaint  Patient presents with  . Tachycardia     (Consider location/radiation/quality/duration/timing/severity/associated sxs/prior Treatment) HPI Comments: Patient with past medical history of hypertension, alcohol abuse, hyperlipidemia, and transposition of the great vessels, presents to the emergency department with SVT. Patient states that he has been treated for atrial flutter in the past, and is supposed take diltiazem and Coumadin daily. He states that he has been noncompliant. Today he felt his heart fluttering and call EMS. EMS reports initial heart rate of 237. Patient was given two 6 mg doses of adenosine with no response, and then given 20 mg of Cardizem which slowed the patient to the 110s.  Patient complains of feeling weak and a little jittery. He states that he has significant alcohol problems, and has not had anything to eat or drink other than alcohol for the past couple of days. He states that his last drink was this morning. He states that in addition to having his heart checked out, he would like to undergo alcohol detox. States that he would like to get control of his life for his kids. No other complaints at this time.  The history is provided by the patient. No language interpreter was used.    Past Medical History  Diagnosis Date  . Complete transposition of great vessels   . Hypertension   . Dyslipidemia   . Depression   . Alcohol abuse   . [facility] spotted fever 11/2013   Past Surgical History  Procedure Laterality Date  . Volar plate arthroplasty, right small finger proxima linterphalangeal joint    . Septostomy      rashkind  . Mustard procedure     Family History  Problem Relation Age of Onset  . Rheumatic fever Father   . Hypertension Father    History  Substance Use Topics  . Smoking status: Never Smoker   .  Smokeless tobacco: Not on file  . Alcohol Use: Yes     Comment: daily    Review of Systems  Constitutional: Negative for fever and chills.  Respiratory: Negative for shortness of breath.   Cardiovascular: Positive for palpitations. Negative for chest pain.  Gastrointestinal: Negative for nausea, vomiting, diarrhea and constipation.  Genitourinary: Negative for dysuria.  Neurological: Positive for weakness.  All other systems reviewed and are negative.     Allergies  Aspirin  Home Medications   Prior to Admission medications   Medication Sig Start Date End Date Taking? Authorizing Provider  ALPRAZolam Duanne Moron) 0.5 MG tablet Take 1 tablet (0.5 mg total) by mouth 3 (three) times daily as needed for anxiety. 01/05/14   Geradine Girt, DO  busPIRone (BUSPAR) 15 MG tablet Take 15 mg by mouth 2 (two) times daily.    Historical Provider, MD  diltiazem (CARDIZEM SR) 90 MG 12 hr capsule TAKE TWO CAPSULES BY MOUTH EVERY 12 HOURS **NEEDS  APPOINTMENT** 07/26/14   Fay Records, MD  feeding supplement, ENSURE COMPLETE, (ENSURE COMPLETE) LIQD Take 237 mLs by mouth 2 (two) times daily between meals. 01/05/14   Geradine Girt, DO  FLUoxetine (PROZAC) 20 MG capsule Take 20 mg by mouth daily.    Historical Provider, MD  folic acid (FOLVITE) 1 MG tablet Take 1 tablet (1 mg total) by mouth daily. 01/05/14   Geradine Girt, DO  furosemide (LASIX) 20  MG tablet Take 1 tablet (20 mg total) by mouth daily. 01/17/14   Imogene Burn, PA-C  losartan (COZAAR) 50 MG tablet Take 1 tablet (50 mg total) by mouth daily. 04/23/14   Erline Hau, MD  metoprolol (LOPRESSOR) 100 MG tablet Take 1 tablet (100 mg total) by mouth 2 (two) times daily. 04/23/14   Erline Hau, MD  Multiple Vitamin (MULTIVITAMIN WITH MINERALS) TABS tablet Take 1 tablet by mouth daily. 04/23/14   Erline Hau, MD  pantoprazole (PROTONIX) 40 MG tablet Take 40 mg by mouth daily.    Historical Provider, MD   potassium chloride SA (KLOR-CON M20) 20 MEQ tablet Take 1 tablet (20 mEq total) by mouth daily. 01/17/14   Imogene Burn, PA-C  QUEtiapine (SEROQUEL XR) 50 MG TB24 24 hr tablet Take 1 tablet (50 mg total) by mouth at bedtime. 01/05/14   Geradine Girt, DO  thiamine 100 MG tablet Take 1 tablet (100 mg total) by mouth daily. 04/23/14   Erline Hau, MD  warfarin (COUMADIN) 7.5 MG tablet Take 1 tablet (7.5 mg total) by mouth daily at 6 PM. 04/23/14   Erline Hau, MD   BP 123/85 mmHg  Pulse 115  Temp(Src) 98.4 F (36.9 C) (Oral)  Resp 23  SpO2 99% Physical Exam  Constitutional: He is oriented to person, place, and time. He appears well-developed and well-nourished.  Somewhat tremulous on exam  HENT:  Head: Normocephalic and atraumatic.  Eyes: Conjunctivae and EOM are normal. Pupils are equal, round, and reactive to light. Right eye exhibits no discharge. Left eye exhibits no discharge. No scleral icterus.  Neck: Normal range of motion. Neck supple. No JVD present.  Cardiovascular: Regular rhythm and normal heart sounds.  Exam reveals no gallop and no friction rub.   No murmur heard. Tachycardic  Pulmonary/Chest: Effort normal and breath sounds normal. No respiratory distress. He has no wheezes. He has no rales. He exhibits no tenderness.  Abdominal: Soft. He exhibits no distension and no mass. There is no tenderness. There is no rebound and no guarding.  Musculoskeletal: Normal range of motion. He exhibits no edema or tenderness.  Neurological: He is alert and oriented to person, place, and time.  Skin: Skin is warm and dry.  Psychiatric: He has a normal mood and affect. His behavior is normal. Judgment and thought content normal.  Nursing note and vitals reviewed.   ED Course  Procedures (including critical care time) Results for orders placed or performed during the hospital encounter of 07/28/14  CBC with Differential  Result Value Ref Range   WBC 6.4 4.0  - 10.5 K/uL   RBC 3.80 (L) 4.22 - 5.81 MIL/uL   Hemoglobin 13.8 13.0 - 17.0 g/dL   HCT 39.0 39.0 - 52.0 %   MCV 102.6 (H) 78.0 - 100.0 fL   MCH 36.3 (H) 26.0 - 34.0 pg   MCHC 35.4 30.0 - 36.0 g/dL   RDW 16.3 (H) 11.5 - 15.5 %   Platelets 173 150 - 400 K/uL   Neutrophils Relative % 76 43 - 77 %   Neutro Abs 4.9 1.7 - 7.7 K/uL   Lymphocytes Relative 14 12 - 46 %   Lymphs Abs 0.9 0.7 - 4.0 K/uL   Monocytes Relative 10 3 - 12 %   Monocytes Absolute 0.6 0.1 - 1.0 K/uL   Eosinophils Relative 0 0 - 5 %   Eosinophils Absolute 0.0 0.0 - 0.7 K/uL  Basophils Relative 0 0 - 1 %   Basophils Absolute 0.0 0.0 - 0.1 K/uL  Basic metabolic panel  Result Value Ref Range   Sodium 142 135 - 145 mmol/L   Potassium 3.0 (L) 3.5 - 5.1 mmol/L   Chloride 105 96 - 112 mEq/L   CO2 25 19 - 32 mmol/L   Glucose, Bld 117 (H) 70 - 99 mg/dL   BUN <5 (L) 6 - 23 mg/dL   Creatinine, Ser 0.92 0.50 - 1.35 mg/dL   Calcium 6.2 (LL) 8.4 - 10.5 mg/dL   GFR calc non Af Amer >90 >90 mL/min   GFR calc Af Amer >90 >90 mL/min   Anion gap 12 5 - 15  Protime-INR  Result Value Ref Range   Prothrombin Time 13.7 11.6 - 15.2 seconds   INR 1.04 0.00 - 1.49  I-stat troponin, ED  Result Value Ref Range   Troponin i, poc 0.00 0.00 - 0.08 ng/mL   Comment 3           Dg Chest Port 1 View  07/28/2014   CLINICAL DATA:  Medical screening examination encounter. Atrial flutter. 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  EXAM: PORTABLE CHEST - 1 VIEW  COMPARISON:  04/21/2014  FINDINGS: Cardiac silhouette remains mildly prominent in size with mild prominence of the ascending aorta unchanged and likely related to prior cardiac surgery. The patient has taken a greater inspiration than on the prior study. The lungs are clear. No pleural effusion or pneumothorax is identified. No acute osseous abnormality is seen.  IMPRESSION: No active disease.   Electronically Signed   By: Logan Bores   On: 07/28/2014 14:28      Imaging Review No results found.   EKG Interpretation   Date/Time:  Thursday July 28 2014 13:54:48 EST Ventricular Rate:  115 PR Interval:  102 QRS Duration: 101 QT Interval:  411 QTC Calculation: 569 R Axis:   -35 Text Interpretation:  Sinus tachycardia Left axis deviation RVH with  secondary repolarization abnrm Repol abnrm suggests ischemia, diffuse  leads Prolonged QT interval Confirmed by ZACKOWSKI  MD, [place] [phone]) on  07/28/2014 1:58:45 PM      MDM   Final diagnoses:  Encounter for medical screening examination  SVT (supraventricular tachycardia)  Alcohol withdrawal, with unspecified complication   Patient with extreme sinus tachycardia to 237, rate controlled with Cardizem by EMS. Heart rate is now 110s.  Patient denies any chest pain or shortness breath. He does complain of generalized weakness. Will check basic labs, plan for fluids after chest x-ray, and will reassess. Patient also requesting detox from alcohol.  Patient discussed with Dr. Rogene Houston, who agrees with the plan.  Patient now feeling quite tremulous.  Concern for withdrawal symptoms.  Will start CIWA protocol.  Notified that patient has critically low Calcium of 6.2.  Discussed with Dr. Rogene Houston, who recommends admission to the hospital for alcohol withdrawal and SVT.  Patient also noted to have a K of 3.0.  Will supplement and check a magnesium level.  Ethanol pending.   Patient discussed with family medicine team, who will admit the patient to stepdown.  Medications  LORazepam (ATIVAN) tablet 0-4 mg (1 mg Oral Given 07/28/14 1502)    Followed by  LORazepam (ATIVAN) tablet 0-4 mg (not administered)  thiamine (VITAMIN B-1) tablet 100 mg (100 mg Oral Given 07/28/14 1501)    Or  thiamine (B-1) injection 100 mg ( Intravenous See Alternative 07/28/14 1501)  potassium chloride 10 mEq in 100 mL IVPB (not administered)  sodium chloride 0.9 % bolus 1,000 mL (1,000 mLs Intravenous New Bag/Given  07/28/14 1448)    Filed Vitals:   07/28/14 1500  BP: 101/77  Pulse: 109  Temp:   Resp: [address], PA-C 07/28/14 1609  Fredia Sorrow, MD 07/28/14 1642

## 2014-07-28 NOTE — Progress Notes (Signed)
Compatibility checked on potassium and Cardizem. Meds compatible and running together d/t pt complaining of burning.

## 2014-07-28 NOTE — Progress Notes (Signed)
ANTICOAGULATION CONSULT NOTE - Initial Consult  Pharmacy Consult for Warfarin Indication: atrial flutter  Allergies  Allergen Reactions  . Aspirin Other (See Comments)    Unknown    Patient Measurements:  Vital Signs: Temp: 98.4 F (36.9 C) (01/14 1356) Temp Source: Oral (01/14 1356) BP: 100/63 mmHg (01/14 1830) Pulse Rate: 109 (01/14 1830)  Labs:  Recent Labs  07/28/14 1428  HGB 13.8  HCT 39.0  PLT 173  LABPROT 13.7  INR 1.04  CREATININE 0.92    CrCl cannot be calculated (Unknown ideal weight.).   Medical History: Past Medical History  Diagnosis Date  . Complete transposition of great vessels   . Hypertension   . Dyslipidemia   . Depression   . Alcohol abuse   . [place] fever 11/2013    Medications:  Scheduled:  . LORazepam  0-4 mg Oral 4 times per day   Followed by  . [START ON 07/30/2014] LORazepam  0-4 mg Oral Q12H  . thiamine  100 mg Oral Daily   Or  . thiamine  100 mg Intravenous Daily   Assessment: 55 YOM w/ PMH of HTN, EtOH abuse, HLD, transposition of the great vessels.  Patient is on PTA chronic coumadin and diltiazem for atrial flutter.  Patient reports he has been noncompliant with his medications.  Pharmacy has been consulted to dose warfarin for his atrial flutter.  INR on admit 1.04 (likely has not taken coumadin in quite some time), H/H & plt wnl.  No reports of bleeding at this time.    Goal of Therapy:  INR 2-3   Plan:  - Warfarin 7.5 mg x 1 tonight - Monitor for signs and symptoms of bleeding  Hassie Bruce, Pharm. D. Clinical Pharmacy Resident Pager: [phone] Ph: [phone] 07/28/2014 6:57 PM

## 2014-07-28 NOTE — ED Notes (Signed)
Critical value reported to [place], [place]: Magnesium 0.6

## 2014-07-28 NOTE — Progress Notes (Signed)
Pt admitted to 2C15. Pt is alert and orientated currently having severe tremors. Pt states that he drinks "alot of alcohol a day" and would like to see someone to help. Family is currently present and stated that this is the worst they have ever seen his tremors and that typically by day 2-3 he is delirious from past hospital experiences. Bed alarm is on Pt and CDIF sample sent down. Pt is currently having diarrhea.

## 2014-07-28 NOTE — ED Notes (Signed)
hospitalist at the bedside 

## 2014-07-28 NOTE — Consult Note (Signed)
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 He was noted to be in narrow complex tachycardia with a heart rate of 237. He subsequently received adenosine and diltiazem with improvement in his heart rate 210. Most recent EKG shows sinus tachycardia. Not having access to the admission telemetry's, it's unclear to me what rhythm patient was in. Certainly recurrence of atrial flutter with rapid ventricular response is a possibility as are AV nodal dependent tachycardia as or even ventricular tachycardia; however, latter being unlikely due to hemodynamic stability noted with tachycardia.  On my examination currently, patient appears to have some signs of alcohol withdrawal.  For now we recommend: -- Reviewing his previous transthoracic echocardiogram from June 2015, his systemic ventricle appears to have severe contractile performance. Would recommend being very cautious with calcium channel blockers and would recommend oral beta blockers for now. -- Okay to discontinue diltiazem drip. Start metoprolol tartrate 50 mg twice a day ( Home dose of metoprolol 100 by mouth twice a day. Diltiazem 180 mg twice a day). We'll slowly uptitrate other AV nodal blocking agents. -- recommend restarting Coumadin. -- In the past, patient was recommended to have atrial flutter ablation. Certainly, consideration of this can be made at the time of discharge.  Thank you very much for this very interesting consult. We'll continue to follow along. Please do not hesitate to call the on-call cardiology M.D. with any questions.  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 Matthew [name] has history of long-standing alcohol abuse and for past few days has didn't drinking very heavily. This morning, patient noticed symptoms of palpitation around 10:30. Initially, he felt better after taking some long deep breaths. However, due to persistent palpitations and unwell appearance, family contacted emergency medicine services who noted his heart rate to be in 230s. Reportedly patient was given adenosine 2 and diltiazem 20 mg with gradual improvement in the heart rate to 110s. He was started on diltiazem drip in the emergency department. Unfortunately, I do not have any other rhythm strips available to me. Besides extensive alcohol abuse, patient endorses medication noncompliance. He has been noncompliant with his anticoagulation and states that he has not taken any Coumadin in over 3 weeks.  Of note, patient presented with similar presentation in June 2015 with narrow complex tachycardia and was diagnosed with atypical atrial flutter with rapid ventricular response. He was at that point started on diltiazem and metoprolol and was asked to follow-up with [facility] for possible atrial flutter ablation. He of note has not followed up with [facility] since discharge.  Currently on my interview, patient endorses improvement in his palpitations. He endorses no shortness of breath but endorses generalized shakes.  Cardiac history:  Transposition of great vessels status post run came septostomy and status post mustard at 18 months  Atypical atrial flutter with rapid ventricular rate likely one-to-one conduction in 12/2013 and 07/2014 Previous cardiac imaging  TTE on 12/2013: D transposition of great vessels status post mustard (atrial switch). 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 Moderate right atrial enlargement. Moderate systemic ventricular enlargement with  severely reduced contractile performance, moderate regurgitation off systemic AV valve, morphologic LV EF 45-50%. Systemic ventricular function is worse compared to September 2012.  Past Medical History Past Medical History  Diagnosis Date  . Complete transposition of great vessels   . Hypertension   . Dyslipidemia   . Depression   . Alcohol abuse   . [facility] spotted fever 11/2013    Allergies: Allergies  Allergen Reactions  . Aspirin Other (See Comments)    Unknown    Social History History   Social History  . Marital Status: Married    Spouse Name: N/A    Number of Children: N/A  . Years of Education: N/A   Occupational History  . Not on file.   Social History Main Topics  . Smoking status: Never Smoker   . Smokeless tobacco: Not on file  . Alcohol Use: Yes     Comment: daily  . Drug Use: No  . Sexual Activity: Not on file   Other Topics Concern  . Not on file   Social History Narrative    Family History Family History  Problem Relation Age of Onset  . Rheumatic fever Father   . Hypertension Father     Physical Exam Filed Vitals:   07/28/14 2107  BP: 112/86  Pulse: 110  Temp:   Resp: 19     Gen: appears to have generalized tremors  HEENT: DRY MUCOUS MEMBRANES, AND DIFFICULT TO EVALUATE JVD DUE TO beard Neck: Supple  CV: TACHYCARDIC, NORMAL S1, SLIGHTLY LOUD S2, loudest 6 holosystolic murmur best heard at the apex  Pulm: clear to auscultation bilaterally anteriorly  Abdomen: soft nontender nondistended, obese  Ext: no edema   Labs:  Results for orders placed or performed during the hospital encounter of 07/28/14 (from the past 24 hour(s))  CBC with Differential     Status: Abnormal   Collection Time: 07/28/14  2:28 PM  Result Value Ref Range   WBC 6.4 4.0 - 10.5 K/uL   RBC 3.80 (L) 4.22 - 5.81 MIL/uL   Hemoglobin 13.8 13.0 - 17.0 g/dL   HCT 39.0 39.0 - 52.0 %   MCV 102.6 (H) 78.0 - 100.0 fL   MCH 36.3 (H) 26.0 - 34.0 pg   MCHC  35.4 30.0 - 36.0 g/dL   RDW 16.3 (H) 11.5 - 15.5 %   Platelets 173 150 - 400 K/uL   Neutrophils Relative % 76 43 - 77 %   Neutro Abs 4.9 1.7 - 7.7 K/uL   Lymphocytes Relative 14 12 - 46 %   Lymphs Abs 0.9 0.7 - 4.0 K/uL   Monocytes Relative 10 3 - 12 %   Monocytes Absolute 0.6 0.1 - 1.0 K/uL   Eosinophils Relative 0 0 - 5 %   Eosinophils Absolute 0.0 0.0 - 0.7 K/uL   Basophils Relative 0 0 - 1 %   Basophils Absolute 0.0 0.0 - 0.1 K/uL  Basic metabolic panel     Status: Abnormal   Collection Time: 07/28/14  2:28 PM  Result Value Ref Range   Sodium 142 135 - 145 mmol/L   Potassium 3.0 (L) 3.5 - 5.1 mmol/L   Chloride 105 96 - 112 mEq/L   CO2 25 19 - 32 mmol/L   Glucose, Bld 117 (H) 70 - 99 mg/dL   BUN <5 (L) 6 - 23 mg/dL   Creatinine, Ser 0.92 0.50 - 1.35 mg/dL   Calcium 6.2 (LL) 8.4 - 10.5 mg/dL   GFR calc non  Af Amer >90 >90 mL/min   GFR calc Af Amer >90 >90 mL/min   Anion gap 12 5 - 15  Protime-INR     Status: None   Collection Time: 07/28/14  2:28 PM  Result Value Ref Range   Prothrombin Time 13.7 11.6 - 15.2 seconds   INR 1.04 0.00 - 1.49  Magnesium     Status: Abnormal   Collection Time: 07/28/14  2:28 PM  Result Value Ref Range   Magnesium 0.6 (LL) 1.5 - 2.5 mg/dL  Ethanol     Status: Abnormal   Collection Time: 07/28/14  2:29 PM  Result Value Ref Range   Alcohol, Ethyl (B) 23 (H) 0 - 9 mg/dL  I-stat troponin, ED     Status: None   Collection Time: 07/28/14  2:36 PM  Result Value Ref Range   Troponin i, poc 0.00 0.00 - 0.08 ng/mL   Comment 3          Hepatic function panel     Status: Abnormal   Collection Time: 07/28/14  4:00 PM  Result Value Ref Range   Total Protein 6.2 6.0 - 8.3 g/dL   Albumin 2.9 (L) 3.5 - 5.2 g/dL   AST 211 (H) 0 - 37 U/L   ALT 107 (H) 0 - 53 U/L   Alkaline Phosphatase 85 39 - 117 U/L   Total Bilirubin 1.3 (H) 0.3 - 1.2 mg/dL   Bilirubin, Direct 0.5 (H) 0.0 - 0.3 mg/dL   Indirect Bilirubin 0.8 0.3 - 0.9 mg/dL  Urine rapid drug screen  (hosp performed)     Status: Abnormal   Collection Time: 07/28/14  8:35 PM  Result Value Ref Range   Opiates NONE DETECTED NONE DETECTED   Cocaine NONE DETECTED NONE DETECTED   Benzodiazepines POSITIVE (A) NONE DETECTED   Amphetamines NONE DETECTED NONE DETECTED   Tetrahydrocannabinol NONE DETECTED NONE DETECTED   Barbiturates NONE DETECTED NONE DETECTED

## 2014-07-28 NOTE — ED Notes (Signed)
Called EMS for heart racing; HR 237 on their arrival. Given 6mg , 12mg , and 12mg  of Adenosine with decrease to 180-190's. Given 20mg  of Cardizem with decrease in HR to 114. Also given 4mg  Zofran for nausea. Patient reports history of AFlutter, missed cardizem yesterday. Reports heavy ETOH use, vascillating between trying to "dry out" and giving in and drinking a pint of liquor a day. Reports last drink this morning.

## 2014-07-28 NOTE — ED Notes (Signed)
Patient was given a cup of water. 

## 2014-07-28 NOTE — ED Notes (Signed)
Critical value reported to [place], [place]: Calcium 6.2

## 2014-07-29 DIAGNOSIS — R000 Tachycardia, unspecified: Secondary | ICD-10-CM

## 2014-07-29 DIAGNOSIS — I471 Supraventricular tachycardia: Secondary | ICD-10-CM

## 2014-07-29 DIAGNOSIS — F10230 Alcohol dependence with withdrawal, uncomplicated: Secondary | ICD-10-CM

## 2014-07-29 DIAGNOSIS — I5022 Chronic systolic (congestive) heart failure: Secondary | ICD-10-CM | POA: Diagnosis present

## 2014-07-29 LAB — BASIC METABOLIC PANEL
Anion gap: 10 (ref 5–15)
BUN: <5 mg/dL — ABNORMAL LOW (ref 6–23)
CO2: 24 mmol/L (ref 19–32)
Calcium: 6 mg/dL — CL (ref 8.4–10.5)
Chloride: 104 mEq/L (ref 96–112)
Creatinine, Ser: 0.92 mg/dL (ref 0.50–1.35)
GFR calc Af Amer: >90 mL/min (ref >90)
GFR calc non Af Amer: >90 mL/min (ref >90)
Glucose, Bld: 136 mg/dL — ABNORMAL HIGH (ref 70–99)
Potassium: 3.1 mmol/L — ABNORMAL LOW (ref 3.5–5.1)
Sodium: 138 mmol/L (ref 135–145)

## 2014-07-29 LAB — LIPID PANEL
Cholesterol: 150 mg/dL (ref 0–200)
HDL: 38 mg/dL — ABNORMAL LOW (ref >39)
LDL Cholesterol: 95 mg/dL (ref 0–99)
Total CHOL/HDL Ratio: 3.9 RATIO
Triglycerides: 87 mg/dL (ref <150)
VLDL: 17 mg/dL (ref 0–40)

## 2014-07-29 LAB — PROTIME-INR
INR: 1.11 (ref 0.00–1.49)
Prothrombin Time: 14.5 seconds (ref 11.6–15.2)

## 2014-07-29 LAB — CBC
HCT: 34.9 % — ABNORMAL LOW (ref 39.0–52.0)
Hemoglobin: 12.1 g/dL — ABNORMAL LOW (ref 13.0–17.0)
MCH: 35.4 pg — ABNORMAL HIGH (ref 26.0–34.0)
MCHC: 34.7 g/dL (ref 30.0–36.0)
MCV: 102 fL — ABNORMAL HIGH (ref 78.0–100.0)
Platelets: 137 10*3/uL — ABNORMAL LOW (ref 150–400)
RBC: 3.42 MIL/uL — ABNORMAL LOW (ref 4.22–5.81)
RDW: 16 % — ABNORMAL HIGH (ref 11.5–15.5)
WBC: 5.3 10*3/uL (ref 4.0–10.5)

## 2014-07-29 LAB — TROPONIN I
Troponin I: <0.03 ng/mL (ref <0.031)
Troponin I: <0.03 ng/mL (ref <0.031)

## 2014-07-29 LAB — COMPREHENSIVE METABOLIC PANEL
ALT: 84 U/L — ABNORMAL HIGH (ref 0–53)
AST: 143 U/L — ABNORMAL HIGH (ref 0–37)
Albumin: 2.6 g/dL — ABNORMAL LOW (ref 3.5–5.2)
Alkaline Phosphatase: 75 U/L (ref 39–117)
Anion gap: 4 — ABNORMAL LOW (ref 5–15)
BUN: <5 mg/dL — ABNORMAL LOW (ref 6–23)
CO2: 30 mmol/L (ref 19–32)
Calcium: 6.7 mg/dL — ABNORMAL LOW (ref 8.4–10.5)
Chloride: 105 mEq/L (ref 96–112)
Creatinine, Ser: 0.85 mg/dL (ref 0.50–1.35)
GFR calc Af Amer: >90 mL/min (ref >90)
GFR calc non Af Amer: >90 mL/min (ref >90)
Glucose, Bld: 108 mg/dL — ABNORMAL HIGH (ref 70–99)
Potassium: 3.6 mmol/L (ref 3.5–5.1)
Sodium: 139 mmol/L (ref 135–145)
Total Bilirubin: 1.6 mg/dL — ABNORMAL HIGH (ref 0.3–1.2)
Total Protein: 5.2 g/dL — ABNORMAL LOW (ref 6.0–8.3)

## 2014-07-29 LAB — MRSA PCR SCREENING: MRSA by PCR: NEGATIVE

## 2014-07-29 LAB — CLOSTRIDIUM DIFFICILE BY PCR: Toxigenic C. Difficile by PCR: NEGATIVE

## 2014-07-29 LAB — MAGNESIUM
Magnesium: 1 mg/dL — ABNORMAL LOW (ref 1.5–2.5)
Magnesium: 1 mg/dL — ABNORMAL LOW (ref 1.5–2.5)

## 2014-07-29 LAB — PHOSPHORUS: Phosphorus: 2.3 mg/dL (ref 2.3–4.6)

## 2014-07-29 LAB — HEMOGLOBIN A1C
Hgb A1c MFr Bld: 5.1 % (ref <5.7)
Mean Plasma Glucose: 100 mg/dL (ref <117)

## 2014-07-29 MED ORDER — METOPROLOL TARTRATE 50 MG PO TABS
50.0000 mg | ORAL_TABLET | Freq: Two times a day (BID) | ORAL | Status: DC
  Administered 2014-07-29 – 2014-07-30 (×4): 50 mg via ORAL
  Filled 2014-07-29 (×6): qty 1

## 2014-07-29 MED ORDER — LORAZEPAM 2 MG/ML IJ SOLN
1.0000 mg | Freq: Three times a day (TID) | INTRAMUSCULAR | Status: DC
  Administered 2014-07-29 – 2014-07-30 (×2): 1 mg via INTRAVENOUS
  Filled 2014-07-29 (×2): qty 1

## 2014-07-29 MED ORDER — WARFARIN SODIUM 10 MG PO TABS
10.0000 mg | ORAL_TABLET | Freq: Once | ORAL | Status: AC
  Administered 2014-07-29: 10 mg via ORAL
  Filled 2014-07-29: qty 1

## 2014-07-29 MED ORDER — SODIUM CHLORIDE 0.9 % IV SOLN
2.0000 g | Freq: Once | INTRAVENOUS | Status: AC
  Administered 2014-07-29: 2 g via INTRAVENOUS
  Filled 2014-07-29: qty 20

## 2014-07-29 MED ORDER — DILTIAZEM HCL 100 MG IV SOLR
5.0000 mg/h | INTRAVENOUS | Status: DC
  Filled 2014-07-29: qty 100

## 2014-07-29 MED ORDER — WARFARIN - PHARMACIST DOSING INPATIENT
Freq: Every day | Status: DC
  Administered 2014-07-31: 18:00:00

## 2014-07-29 MED ORDER — MAGNESIUM SULFATE 2 GM/50ML IV SOLN
2.0000 g | Freq: Once | INTRAVENOUS | Status: AC
  Administered 2014-07-29: 2 g via INTRAVENOUS
  Filled 2014-07-29: qty 50

## 2014-07-29 MED ORDER — LORAZEPAM 2 MG/ML IJ SOLN: 1.0000 mg | Freq: Four times a day (QID) | INTRAMUSCULAR | Status: DC

## 2014-07-29 NOTE — Progress Notes (Signed)
CRITICAL VALUE ALERT  Critical value received:  Calcium 6  Date of notification:  07/29/2014   Time of notification:  12:29 AM   Critical value read back:Yes.    Nurse who received alert:  Monico Hoar, RN   MD notified (1st page):  Gerarda Fraction, MD  Time of first page:  12:31 AM   MD notified (2nd page):  Time of second page:  Responding MD:  MD Gerarda Fraction  Time MD responded:  12:32 AM

## 2014-07-29 NOTE — Progress Notes (Signed)
Subjective:  No complaints.   Objective:  Vital Signs in the last 24 hours: Temp:  [98.4 F (36.9 C)-98.8 F (37.1 C)] 98.8 F (37.1 C) (01/15 0736) Pulse Rate:  [102-116] 116 (01/15 0736) Resp:  [16-24] 20 (01/15 0736) BP: (90-123)/(63-94) 102/86 mmHg (01/15 0736) SpO2:  [96 %-100 %] 97 % (01/15 0736) Weight:  [189 lb 6 oz (85.9 kg)-189 lb 9.5 oz (86 kg)] 189 lb 9.5 oz (86 kg) (01/15 0500)  Intake/Output from previous day: 01/14 0701 - 01/15 0700 In: 800 [I.V.:800] Out: 1200 [Urine:1200]   Physical Exam: General: Well developed, well nourished, in no acute distress. Head:  Normocephalic and atraumatic. Lungs: Clear to auscultation and percussion. Heart: Tachy Reg, loud S2, HSM apex.  Abdomen: soft, non-tender, positive bowel sounds. Extremities: No clubbing or cyanosis. No edema. Neurologic: Alert and oriented x 3.    Lab Results:  Recent Labs  07/28/14 1428 07/29/14 0407  WBC 6.4 5.3  HGB 13.8 12.1*  PLT 173 137*    Recent Labs  07/28/14 2307 07/29/14 0407  NA 138 139  K 3.1* 3.6  CL 104 105  CO2 24 30  GLUCOSE 136* 108*  BUN <5* <5*  CREATININE 0.92 0.85    Recent Labs  07/28/14 2307 07/29/14 0407  TROPONINI <0.03 <0.03   Hepatic Function Panel  Recent Labs  07/28/14 1600 07/29/14 0407  PROT 6.2 5.2*  ALBUMIN 2.9* 2.6*  AST 211* 143*  ALT 107* 84*  ALKPHOS 85 75  BILITOT 1.3* 1.6*  BILIDIR 0.5*  --   IBILI 0.8  --     Recent Labs  07/29/14 0407  CHOL 150   No results for input(s): PROTIME in the last 72 hours.  Imaging: Dg Chest Port 1 View  07/28/2014   CLINICAL DATA:  Medical screening examination encounter. Atrial flutter. 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  EXAM: PORTABLE CHEST - 1 VIEW  COMPARISON:  04/21/2014  FINDINGS: Cardiac silhouette remains mildly prominent in size with mild prominence of the ascending aorta unchanged and likely related to prior cardiac surgery. The patient  has taken a greater inspiration than on the prior study. The lungs are clear. No pleural effusion or pneumothorax is identified. No acute osseous abnormality is seen.  IMPRESSION: No active disease.   Electronically Signed   By: Logan Bores   On: 07/28/2014 14:28   Personally viewed.   Telemetry: Atypical flutter with variable conduction.  Personally viewed.   EKG:  Atypical flutter with variable conduction.   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 There is evidence of a baffle in left atrium. Moderate RAE. Moderate RVE (systemic ventricle) with severely reduced function; moderate regurgitation throught the systemic AV valve (TR); mild AI. Morphologic LV shows septal hypokinesis with mildly reduced function (EF 45-50); mild MR. Compared to 04/12/11, systemic ventricular function is worse.  Scheduled Meds: . busPIRone  15 mg Oral BID  . calcium carbonate  1 tablet Oral BID  . FLUoxetine  20 mg Oral Daily  . folic acid  1 mg Oral Daily  . heparin  5,000 Units Subcutaneous 3 times per day  . metoprolol tartrate  50 mg Oral BID  . multivitamin with minerals  1 tablet Oral Daily  . pantoprazole  40 mg Oral Daily  . QUEtiapine  50 mg Oral QHS  . sodium chloride  3 mL Intravenous Q12H  . thiamine  100 mg Oral Daily  . warfarin  10 mg Oral ONCE-1800  . Warfarin - Pharmacist Dosing Inpatient   Does not apply q1800   Continuous Infusions: . diltiazem (CARDIZEM) infusion     PRN Meds:.LORazepam, ondansetron **OR** ondansetron (ZOFRAN) IV   Assessment/Plan:  Active Problems:   Alcohol withdrawal   Tachycardia   Chronic systolic congestive heart failure  Aflutter  - challenging to differentiate sinus tach or atrial flutter with variable conduction on tele and ecg. I believe that his underlying rhythm is atrial flutter.   - Metoprolol seems to be helping out. Continue  -  Encourage follow up in future at [facility] for possible ablation  - Last night was probably Aflutter with 1:1 conduction.   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   ETOH withdrawal.   - primary team.   Marlou Porch, [place] 07/29/2014, 12:11 PM

## 2014-07-29 NOTE — Progress Notes (Signed)
INITIAL NUTRITION ASSESSMENT  DOCUMENTATION CODES Per approved criteria  -Not Applicable   INTERVENTION:  Continue current diet and daily MVI.  NUTRITION DIAGNOSIS: Unintentional weight loss related to poor appetite and ETOH abuse as evidenced by 12% weight loss in 6 months.   Goal: Intake to meet >90% of estimated nutrition needs.  Monitor:  PO intake, labs, weight trend.  Reason for Assessment: Malnutrition Screening Tool  39 y.o. male  Admitting Dx: Sinus Tachycardia  ASSESSMENT: Patient admitted on 1/14 with abnormal heart rate. History of congenital heart defect, alcoholism, and 50 lb weight loss in one year.  Unable to speak with patient at this time. Per discussion with RN, he is eating very well, consuming 100% of meals. Patient is at nutrition risk, given ETOH abuse and unintentional weight loss.  Height: Ht Readings from Last 1 Encounters:  07/28/14 6\' 3"  (1.905 m)    Weight: Wt Readings from Last 1 Encounters:  07/29/14 189 lb 9.5 oz (86 kg)    Ideal Body Weight: 89.1 kg  % Ideal Body Weight: 97%  Wt Readings from Last 10 Encounters:  07/29/14 189 lb 9.5 oz (86 kg)  04/22/14 196 lb 10.4 oz (89.2 kg)  01/17/14 215 lb (97.523 kg)  11/23/13 208 lb (94.348 kg)  08/10/12 232 lb (105.235 kg)  04/12/11 214 lb (97.07 kg)  04/26/10 219 lb (99.338 kg)  04/21/09 219 lb (99.338 kg)  01/26/09 213 lb (96.616 kg)    Usual Body Weight: 215 lbs (6 months ago)  % Usual Body Weight: 88%  BMI:  Body mass index is 23.7 kg/(m^2).  Estimated Nutritional Needs: Kcal: 2300-2500 Protein: 110-125 gm Fluid: 2.3-2.5 L  Skin: WDL  Diet Order: Diet Heart  EDUCATION NEEDS: -No education needs identified at this time   Intake/Output Summary (Last 24 hours) at 07/29/14 0840 Last data filed at 07/29/14 0600  Gross per 24 hour  Intake    800 ml  Output   1200 ml  Net   -400 ml    Last BM: 1/14   Labs:   Recent Labs Lab 07/28/14 1428 07/28/14 2307  07/29/14 0407  NA 142 138 139  K 3.0* 3.1* 3.6  CL 105 104 105  CO2 25 24 30   BUN <5* <5* <5*  CREATININE 0.92 0.92 0.85  CALCIUM 6.2* 6.0* 6.7*  MG 0.6* 1.0* 1.0*  GLUCOSE 117* 136* 108*    CBG (last 3)  No results for input(s): GLUCAP in the last 72 hours.  Scheduled Meds: . busPIRone  15 mg Oral BID  . calcium carbonate  1 tablet Oral BID  . FLUoxetine  20 mg Oral Daily  . folic acid  1 mg Oral Daily  . heparin  5,000 Units Subcutaneous 3 times per day  . Influenza vac split quadrivalent PF  0.5 mL Intramuscular Tomorrow-1000  . multivitamin with minerals  1 tablet Oral Daily  . pantoprazole  40 mg Oral Daily  . QUEtiapine  50 mg Oral QHS  . sodium chloride  3 mL Intravenous Q12H  . thiamine  100 mg Oral Daily    Continuous Infusions: . sodium chloride 100 mL/hr at 07/28/14 2044  . diltiazem (CARDIZEM) infusion 5 mg/hr (07/28/14 2053)    Past Medical History  Diagnosis Date  . Complete transposition of great vessels   . Hypertension   . Dyslipidemia   . Depression   . Alcohol abuse   . [facility] spotted fever 11/2013    Past Surgical History  Procedure  Laterality Date  . Volar plate arthroplasty, right small finger proxima linterphalangeal joint    . Septostomy      rashkind  . Mustard procedure      Molli Barrows, RD, LDN, [place] Pager [phone] After Hours Pager [phone]

## 2014-07-29 NOTE — Progress Notes (Signed)
Pt stated that he is started to hear things and wants more medication to help because he does not want to "get crazy" like his last admission. Nurse witnessed pt talking to someone in the room.

## 2014-07-29 NOTE — Progress Notes (Signed)
Utilization Review Completed.  

## 2014-07-29 NOTE — Progress Notes (Signed)
Family Medicine Teaching Service Daily Progress Note Intern Pager: [phone]  Patient name: Matthew [name] Medical record number: [id number] Date of birth: Nov 23, 1975 Age: 39 y.o. Gender: male  Primary Care Provider: Glo Herring., MD Consultants: Cardiology Code Status: full  Pt Overview and Major Events to Date:  1/14: Admit for narrow complex tachycardia  Assessment and Plan:  Matthew [name] is a 39 y.o. male presenting with Sinus tachycardia s/p SVT en route. PMH is significant for alcohol abuse, chronic systolic CHF,   Narrow complex tachycardia: Reported to have been in SVT per EMS with HR 237. S/p Adenosine 6mg  x2 and Diltiazem 20mg  x1. Admits to missing a few days of home diltiazem. Currently in sinus tachycardia with HR in 110s. Denies CP/SOB, but some ischemic changes on EKG. Initial troponin neg. CXR clear. Likely related to missing medications and alcohol intake. - Diltiazem drip O/N - will transition to Metoprolol 50mg  BID - Monitor on telemetry - Holding home metoprolol and diltiazem  - Cycle troponins x2 - negative - Repeat EKG unchanged - Cardiology consulted, appreciate recs  Alcohol abuse, concern for withdrawal: h/o hallucinations with withdrawal in the past. Last drink around 4am today per patient report. Patient endorses a recent 2-3 day alcohol binge with no PO intake. - Ethanol level - 23 - SDU CIWA protocol - required 9mg  of ativan O/N, scores 7-9 - continue home thiamine and MVI - SW consult for substance abuse - UDS pos for benzos - LFTs improving 211/107 -> 143/84  Electrolyte abnormalities: Hypokalemia, Hypocalcemia, Hypomagnesemia: Likely related to diarrhea and decreased PO intake in setting of alcohol abuse. K 3.0 on admission >> 3.6. Ca 6.2 on admission >> 6.7 (7.8 corrected for albumin). Magnesium 0.6 on admission >> 1.0. - s/p Potassium 45mEq IV q1h x8, s/p 2g IV calcium gluconate, s/p 2g of IV Mag - Continue Tums BID - Check  phosphorous - watch for refeeding syndrome - Consider repeating IV Ca and Mag - Continue to monitor - repeat labs this PM  Diarrhea: Likely related to alcohol use. No fevers. - c diff pcr pending - enteric precautions  Macrocytosis: MCV 102.6, no anemia. Likely related to alcoholism. - Can consider B12 and folate levels - will get b12 and folate supplementation while on CIWA  Chronic systolic CHF, h/o transposition of great vessels s/p Mustard procedure: Last Echo in 12/2013 showed moderate RVE (systemic ventricle) with severely reduced function, LVEF 45-50%. Followed by cardiology as OP. No signs of volume overload on exam. - Holding home Lasix in setting of dehydration - daily weights, strict Is&Os - Monitor fluid status carefully - Not currently on statin, likely related to elevated transaminases  HTN: BPs soft O/N. - Holding home metoprolol, losartan - Monitor BP closely  A flutter, on chronic anticoagulation: Missed several weeks of coumadin. INR 1.04. - Coumadin per pharm - Consider NOAC  Psych: Depression and anxiety - continue home Prozac, buspar, seroquel  FEN/GI: Heart healthy diet, SLIV Prophylaxis: subq heparin while INR subtherapeutic  Disposition: Pending improvement in arrhythmia, withdrawal, and electrolytes.  Subjective:  Feelign somewhat better.  Eatign better than previously. Interested in alcohol rehab.  Objective: Temp:  [98.4 F (36.9 C)-98.8 F (37.1 C)] 98.8 F (37.1 C) (01/15 0736) Pulse Rate:  [102-116] 116 (01/15 0736) Resp:  [16-24] 20 (01/15 0736) BP: (90-123)/(63-94) 102/86 mmHg (01/15 0736) SpO2:  [96 %-100 %] 97 % (01/15 0736) Weight:  [189 lb 6 oz (85.9 kg)-189 lb 9.5 oz (86 kg)] 189 lb 9.5 oz (86  kg) (01/15 0500) Physical Exam: General: Somewhat tremulous male laying in bed, NAD HEENT: NCAT, MMM, PERRL, EOMI Cardiovascular: Tachycardic, regular rhythm, 2/6 systolic murmur at LUSB, no g/r. 2+ DP pulses b/l Respiratory: Normal WOB,  CTAB, no w/r/c. Abdomen: Soft, NTND, no masses, rebound, guarding. +BS Extremities: WWP, no edema Neuro: A&O x3, CN 2-12 grossly intact, no focal deficits, moves all extremities. Upper and lower extremity tremors noted  Laboratory:  Recent Labs Lab 07/28/14 1428 07/29/14 0407  WBC 6.4 5.3  HGB 13.8 12.1*  HCT 39.0 34.9*  PLT 173 137*    Recent Labs Lab 07/28/14 1428 07/28/14 1600 07/28/14 2307 07/29/14 0407  NA 142  --  138 139  K 3.0*  --  3.1* 3.6  CL 105  --  104 105  CO2 25  --  24 30  BUN <5*  --  <5* <5*  CREATININE 0.92  --  0.92 0.85  CALCIUM 6.2*  --  6.0* 6.7*  PROT  --  6.2  --  5.2*  BILITOT  --  1.3*  --  1.6*  ALKPHOS  --  85  --  75  ALT  --  107*  --  84*  AST  --  211*  --  143*  GLUCOSE 117*  --  136* 108*    Magnesium: 0.6  Troponin 0.00  INR 1.04  Imaging/Diagnostic Tests:  CXR (1/14): No active disease.  EKG (1/14): Sinus tachycardia, left axis deviation, RVH with repol abnormality, T wave inversion and ST depression in lateral leads  Lavon Paganini, MD 07/29/2014, 8:18 AM PGY-1, [place] Intern pager: [phone], text pages welcome

## 2014-07-29 NOTE — Progress Notes (Signed)
ANTICOAGULATION CONSULT NOTE - Follow-up  Pharmacy Consult for Warfarin Indication: atrial flutter  Allergies  Allergen Reactions  . Aspirin Other (See Comments)    Unknown    Patient Measurements:  Vital Signs: Temp: 98.8 F (37.1 C) (01/15 0736) Temp Source: Oral (01/15 0736) BP: 102/86 mmHg (01/15 0736) Pulse Rate: 116 (01/15 0736)  Labs:  Recent Labs  07/28/14 1428 07/28/14 2307 07/29/14 0407 07/29/14 1100  HGB 13.8  --  12.1*  --   HCT 39.0  --  34.9*  --   PLT 173  --  137*  --   LABPROT 13.7  --   --  14.5  INR 1.04  --   --  1.11  CREATININE 0.92 0.92 0.85  --   TROPONINI  --  <0.03 <0.03  --     Estimated Creatinine Clearance: 143.6 mL/min (by C-G formula based on Cr of 0.85).  Assessment: 55 YOM w/ PMH of HTN, EtOH abuse, HLD, transposition of the great vessels. Patient is on PTA chronic coumadin and diltiazem for atrial flutter. Patient reports he has been noncompliant with his medications. INR remains low today at 1.11. No bleeding noted.  Goal of Therapy:  INR 2-3   Plan:  1. Warfarin 10mg  PO x 1 tonight 2. F/u AM INR  Salome Arnt, PharmD, BCPS Pager # [phone] 07/29/2014 12:11 PM

## 2014-07-30 DIAGNOSIS — Q203 Discordant ventriculoarterial connection: Secondary | ICD-10-CM

## 2014-07-30 DIAGNOSIS — I4892 Unspecified atrial flutter: Secondary | ICD-10-CM

## 2014-07-30 LAB — BASIC METABOLIC PANEL
Anion gap: 6 (ref 5–15)
BUN: 7 mg/dL (ref 6–23)
CO2: 27 mmol/L (ref 19–32)
Calcium: 7.5 mg/dL — ABNORMAL LOW (ref 8.4–10.5)
Chloride: 105 mEq/L (ref 96–112)
Creatinine, Ser: 0.91 mg/dL (ref 0.50–1.35)
GFR calc Af Amer: >90 mL/min (ref >90)
GFR calc non Af Amer: >90 mL/min (ref >90)
Glucose, Bld: 111 mg/dL — ABNORMAL HIGH (ref 70–99)
Potassium: 3.5 mmol/L (ref 3.5–5.1)
Sodium: 138 mmol/L (ref 135–145)

## 2014-07-30 LAB — COMPREHENSIVE METABOLIC PANEL
ALT: 74 U/L — ABNORMAL HIGH (ref 0–53)
ALT: 93 U/L — ABNORMAL HIGH (ref 0–53)
AST: 120 U/L — ABNORMAL HIGH (ref 0–37)
AST: 210 U/L — ABNORMAL HIGH (ref 0–37)
Albumin: 2.6 g/dL — ABNORMAL LOW (ref 3.5–5.2)
Albumin: 2.7 g/dL — ABNORMAL LOW (ref 3.5–5.2)
Alkaline Phosphatase: 71 U/L (ref 39–117)
Alkaline Phosphatase: 64 U/L (ref 39–117)
Anion gap: 4 — ABNORMAL LOW (ref 5–15)
Anion gap: 8 (ref 5–15)
BUN: 7 mg/dL (ref 6–23)
BUN: 7 mg/dL (ref 6–23)
CO2: 29 mmol/L (ref 19–32)
CO2: 27 mmol/L (ref 19–32)
Calcium: 7.5 mg/dL — ABNORMAL LOW (ref 8.4–10.5)
Calcium: 7.4 mg/dL — ABNORMAL LOW (ref 8.4–10.5)
Chloride: 107 mEq/L (ref 96–112)
Chloride: 103 mEq/L (ref 96–112)
Creatinine, Ser: 0.88 mg/dL (ref 0.50–1.35)
Creatinine, Ser: 0.82 mg/dL (ref 0.50–1.35)
GFR calc Af Amer: >90 mL/min (ref >90)
GFR calc Af Amer: >90 mL/min (ref >90)
GFR calc non Af Amer: >90 mL/min (ref >90)
GFR calc non Af Amer: >90 mL/min (ref >90)
Glucose, Bld: 115 mg/dL — ABNORMAL HIGH (ref 70–99)
Glucose, Bld: 110 mg/dL — ABNORMAL HIGH (ref 70–99)
Potassium: 3.6 mmol/L (ref 3.5–5.1)
Potassium: 4.4 mmol/L (ref 3.5–5.1)
Sodium: 140 mmol/L (ref 135–145)
Sodium: 138 mmol/L (ref 135–145)
Total Bilirubin: 1 mg/dL (ref 0.3–1.2)
Total Bilirubin: 2.3 mg/dL — ABNORMAL HIGH (ref 0.3–1.2)
Total Protein: 5.3 g/dL — ABNORMAL LOW (ref 6.0–8.3)
Total Protein: 5.4 g/dL — ABNORMAL LOW (ref 6.0–8.3)

## 2014-07-30 LAB — CBC
HCT: 32.4 % — ABNORMAL LOW (ref 39.0–52.0)
Hemoglobin: 11.2 g/dL — ABNORMAL LOW (ref 13.0–17.0)
MCH: 36.4 pg — ABNORMAL HIGH (ref 26.0–34.0)
MCHC: 34.6 g/dL (ref 30.0–36.0)
MCV: 105.2 fL — ABNORMAL HIGH (ref 78.0–100.0)
Platelets: 105 10*3/uL — ABNORMAL LOW (ref 150–400)
RBC: 3.08 MIL/uL — ABNORMAL LOW (ref 4.22–5.81)
RDW: 16.3 % — ABNORMAL HIGH (ref 11.5–15.5)
WBC: 3.6 10*3/uL — ABNORMAL LOW (ref 4.0–10.5)

## 2014-07-30 LAB — MAGNESIUM
Magnesium: 0.7 mg/dL — CL (ref 1.5–2.5)
Magnesium: 1.3 mg/dL — ABNORMAL LOW (ref 1.5–2.5)
Magnesium: 1.3 mg/dL — ABNORMAL LOW (ref 1.5–2.5)

## 2014-07-30 LAB — PROTIME-INR
INR: 1.17 (ref 0.00–1.49)
Prothrombin Time: 15 seconds (ref 11.6–15.2)

## 2014-07-30 LAB — PHOSPHORUS: Phosphorus: 2.6 mg/dL (ref 2.3–4.6)

## 2014-07-30 MED ORDER — MAGNESIUM SULFATE 2 GM/50ML IV SOLN
2.0000 g | Freq: Once | INTRAVENOUS | Status: AC
  Administered 2014-07-30: 2 g via INTRAVENOUS
  Filled 2014-07-30 (×2): qty 50

## 2014-07-30 MED ORDER — LORAZEPAM 2 MG/ML IJ SOLN
2.0000 mg | Freq: Four times a day (QID) | INTRAMUSCULAR | Status: DC
  Administered 2014-07-30 – 2014-08-04 (×17): 2 mg via INTRAVENOUS
  Filled 2014-07-30 (×17): qty 1

## 2014-07-30 MED ORDER — LORAZEPAM 2 MG/ML IJ SOLN: 1.0000 mg | Freq: Four times a day (QID) | INTRAMUSCULAR | Status: DC

## 2014-07-30 MED ORDER — WARFARIN SODIUM 10 MG PO TABS
10.0000 mg | ORAL_TABLET | Freq: Once | ORAL | Status: AC
  Administered 2014-07-30: 10 mg via ORAL
  Filled 2014-07-30: qty 1

## 2014-07-30 NOTE — Progress Notes (Signed)
Family Medicine Teaching Service Daily Progress Note Intern Pager: [phone]  Patient name: Matthew [name] Medical record number: [id number] Date of birth: Sep 09, 1975 Age: 39 y.o. Gender: male  Primary Care Provider: Glo [name]., MD Consultants: Cardiology Code Status: full  Pt Overview and Major Events to Date:  1/14: Admit for narrow complex tachycardia  Assessment and Plan:  Matthew [name] is a 39 y.o. male presenting with Sinus tachycardia s/p SVT en route. PMH is significant for alcohol abuse, chronic systolic CHF,   Narrow complex tachycardia: Reported to have been in SVT per EMS with HR 237. S/p Adenosine 6mg  x2 and Diltiazem 20mg  x1. Admits to missing a few days of home diltiazem. Currently in sinus tachycardia with HR in 110s. Denies CP/SOB, but some ischemic changes on EKG. Initial troponin neg. CXR clear. Likely related to missing medications and alcohol intake. - Diltiazem drip off >>Metoprolol 50mg  BID>> HR mostly ~115 throughout night, easily raises with stimuli to 140's. - Monitor on telemetry - Cycle troponins x2 - negative - Repeat EKG unchanged - Cardiology consulted, appreciate recs  Alcohol abuse, concern for withdrawal: h/o hallucinations with withdrawal in the past. Last drink around 4am 1/14 per patient report. Patient endorses a recent 2-3 day alcohol binge with no PO intake.Ethanol level - 23 on admission.  Prior h/o of withdrawal needing SDU CIWA protocol - required 9mg  of ativan O/N, scores 7-9 - schedule ativan 1 mg q6, watch CIWA closely and add additional scheduled if necessary - continue home thiamine and MVI - SW consult for substance abuse - UDS pos for benzos - LFTs improving 211/107 -> 143/84 >> 120/74  Electrolyte abnormalities: Hypokalemia, Hypocalcemia, Hypomagnesemia: Likely related to diarrhea and decreased PO intake in setting of alcohol abuse. K 3.0 on admission >> 3.6. Ca 6.2 on admission >> 6.7 (7.8 corrected for albumin).  Magnesium 0.6 on admission >> 1.0. - s/p Potassium 31mEq IV q1h x8, s/p 2g IV calcium gluconate, s/p (2) 2g of IV Mag >> electrolytes improving. Mag remains low at 1.3, will recheck level at this afternoon and replete if necessary - Continue Tums BID - Check phosphorous - watch for refeeding syndrome - Consider repeating IV Ca and Mag  Diarrhea: Likely related to alcohol use. No fevers. - c diff pcr negative  Macrocytosis: MCV 102.6, no anemia. Likely related to alcoholism. - Can consider B12 and folate levels - will get b12 and folate supplementation while on CIWA  Chronic systolic CHF, h/o transposition of great vessels s/p Mustard procedure: Last Echo in 12/2013 showed moderate RVE (systemic ventricle) with severely reduced function, LVEF 45-50%. Followed by cardiology as OP. No signs of volume overload on exam. - Holding home Lasix in setting of dehydration; appears euvolumic today - daily weights, strict Is&Os - Monitor fluid status carefully - Not currently on statin, likely related to elevated transaminases  HTN: BPs soft O/N. - Holding home metoprolol, losartan - Monitor BP closely >> have been stable  A flutter, on chronic anticoagulation: Missed several weeks of coumadin. INR 1.04. - Coumadin per pharm - Consider NOAC  Psych: Depression and anxiety - continue home Prozac, buspar, seroquel  FEN/GI: Heart healthy diet, SLIV Prophylaxis: subq heparin while INR subtherapeutic  Disposition: Pending improvement in arrhythmia, withdrawal, and electrolytes.  Subjective:  Patient states he is feeling ok today. He complained of some bad dreams last night that made him anxious. He is eating well. He denies chest pain, shortness of breath or palpitiations.   Objective: Temp:  [97.9 F (  36.6 C)-98.8 F (37.1 C)] 98.1 F (36.7 C) (01/16 0359) Pulse Rate:  [116-118] 118 (01/16 0359) Resp:  [13-27] 23 (01/15 2318) BP: (85-114)/(64-88) 107/75 mmHg (01/16 0359) SpO2:  [94 %-97  %] 97 % (01/16 0359) Weight:  [189 lb 9.5 oz (86 kg)-196 lb (88.905 kg)] 196 lb (88.905 kg) (01/16 0359) Physical Exam: General: NAD, well developed, nontoxic in appearance. Sitting in bed playing with new tablet.  HEENT: NCAT, MMM, PERRL, EOMI Cardiovascular: Tachycardic, regular rhythm, 2/6 systolic murmur at LUSB, no g/r. 2+ DP pulses b/l Respiratory: Normal WOB, CTAB, no w/r/c. Abdomen: Soft, NTND, no masses, rebound, guarding. +BS Extremities: WWP, no edema Neuro: A&O x3, CN 2-12 grossly intact, no focal deficits, moves all extremities. Upper and lower extremity tremors noted  Laboratory:  Recent Labs Lab 07/28/14 1428 07/29/14 0407  WBC 6.4 5.3  HGB 13.8 12.1*  HCT 39.0 34.9*  PLT 173 137*    Recent Labs Lab 07/28/14 1600 07/28/14 2307 07/29/14 0407 07/29/14 2320  NA  --  138 139 138  K  --  3.1* 3.6 3.5  CL  --  104 105 105  CO2  --  24 30 27   BUN  --  <5* <5* 7  CREATININE  --  0.92 0.85 0.91  CALCIUM  --  6.0* 6.7* 7.5*  PROT 6.2  --  5.2*  --   BILITOT 1.3*  --  1.6*  --   ALKPHOS 85  --  75  --   ALT 107*  --  84*  --   AST 211*  --  143*  --   GLUCOSE  --  136* 108* 111*    Magnesium: 0.6>>1.3  Troponin 0.00  INR 1.04  Imaging/Diagnostic Tests:  CXR (1/14): No active disease.  EKG (1/14): Sinus tachycardia, left axis deviation, RVH with repol abnormality, T wave inversion and ST depression in lateral leads  Ma Hillock, DO 07/30/2014, 4:13 AM PGY-3, [place] Intern pager: [phone], text pages welcome

## 2014-07-30 NOTE — Progress Notes (Signed)
ANTICOAGULATION CONSULT NOTE - Follow Up Consult  Pharmacy Consult for Coumadin Indication: Hx of Aflutter  Allergies  Allergen Reactions  . Aspirin Other (See Comments)    Unknown    Patient Measurements: Height: 6\' 3"  (190.5 cm) Weight: 196 lb (88.905 kg) IBW/kg (Calculated) : 84.5  Vital Signs: Temp: 97.6 F (36.4 C) (01/16 0730) Temp Source: Oral (01/16 0730) BP: 131/104 mmHg (01/16 0730) Pulse Rate: 115 (01/16 0730)  Labs:  Recent Labs  07/28/14 1428 07/28/14 2307 07/29/14 0407 07/29/14 1100 07/29/14 2320 07/30/14 0332  HGB 13.8  --  12.1*  --   --  11.2*  HCT 39.0  --  34.9*  --   --  32.4*  PLT 173  --  137*  --   --  105*  LABPROT 13.7  --   --  14.5  --  15.0  INR 1.04  --   --  1.11  --  1.17  CREATININE 0.92 0.92 0.85  --  0.91 0.88  TROPONINI  --  <0.03 <0.03  --   --   --     Estimated Creatinine Clearance: 138.7 mL/min (by C-G formula based on Cr of 0.88).   Assessment: 67 YOM w/ PMH of HTN, EtOH abuse, HLD, transposition of the great vessels. Patient is on PTA chronic coumadin and diltiazem for atrial flutter. Patient reports he has been noncompliant with his medications. INR remains low today at 1.17. Hgb 11.2 and plts 105. No bleeding noted. Medical team considering NOAC, but continuing coumadin for now.  Goal of Therapy:  INR 2-3 Monitor platelets by anticoagulation protocol: Yes   Plan:  Coumadin 10mg  PO x 1 tonight Monitor daily INR, CBC, s/s of bleed D/C heparin SQ when INR therapeutic Hold off on education until decision made about which drug is going to be used.  Matthew [name] J 07/30/2014,8:51 AM

## 2014-07-30 NOTE — Progress Notes (Signed)
Patient at his best at 12:30. A X 3, appropriate. Will continue to monitor for returnnig hallucinations and maintain safety.

## 2014-07-30 NOTE — Progress Notes (Signed)
Subjective:  No complaints.   Objective:  Vital Signs in the last 24 hours: Temp:  [97.6 F (36.4 C)-98.4 F (36.9 C)] 97.6 F (36.4 C) (01/16 0730) Pulse Rate:  [115-118] 115 (01/16 0730) Resp:  [13-27] 26 (01/16 0730) BP: (85-131)/(64-104) 131/104 mmHg (01/16 0730) SpO2:  [94 %-97 %] 96 % (01/16 0730) Weight:  [196 lb (88.905 kg)] 196 lb (88.905 kg) (01/16 0359)  Intake/Output from previous day: 01/15 0701 - 01/16 0700 In: 371.7 [I.V.:371.7] Out: 1400 [Urine:1400]   Physical Exam: General: Well developed, well nourished, in no acute distress. Head:  Normocephalic and atraumatic. Lungs: Clear to auscultation and percussion. Heart: Tachy Reg, loud S2, HSM apex.  Abdomen: soft, non-tender, positive bowel sounds. Extremities: No clubbing or cyanosis. No edema. Neurologic: Alert and oriented x 3.    Lab Results:  Recent Labs  07/29/14 0407 07/30/14 0332  WBC 5.3 3.6*  HGB 12.1* 11.2*  PLT 137* 105*    Recent Labs  07/29/14 2320 07/30/14 0332  NA 138 140  K 3.5 3.6  CL 105 107  CO2 27 29  GLUCOSE 111* 115*  BUN 7 7  CREATININE 0.91 0.88    Recent Labs  07/28/14 2307 07/29/14 0407  TROPONINI <0.03 <0.03   Hepatic Function Panel  Recent Labs  07/28/14 1600  07/30/14 0332  PROT 6.2  < > 5.3*  ALBUMIN 2.9*  < > 2.6*  AST 211*  < > 120*  ALT 107*  < > 74*  ALKPHOS 85  < > 71  BILITOT 1.3*  < > 1.0  BILIDIR 0.5*  --   --   IBILI 0.8  --   --   < > = values in this interval not displayed.  Recent Labs  07/29/14 0407  CHOL 150   No results for input(s): PROTIME in the last 72 hours.  Imaging: Dg Chest Port 1 View  07/28/2014   CLINICAL DATA:  Medical screening examination encounter. Atrial flutter. 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  EXAM: PORTABLE CHEST - 1 VIEW  COMPARISON:  04/21/2014  FINDINGS: Cardiac silhouette remains mildly prominent in size with mild prominence of the ascending aorta  unchanged and likely related to prior cardiac surgery. The patient has taken a greater inspiration than on the prior study. The lungs are clear. No pleural effusion or pneumothorax is identified. No acute osseous abnormality is seen.  IMPRESSION: No active disease.   Electronically Signed   By: Logan Bores   On: 07/28/2014 14:28   Personally viewed.   Telemetry: Atypical flutter with variable conduction.  Personally viewed.   EKG:  Atypical flutter with variable conduction.   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 There is evidence of a baffle in left atrium. Moderate RAE. Moderate RVE (systemic ventricle) with severely reduced function; moderate regurgitation throught the systemic AV valve (TR); mild AI. Morphologic LV shows septal hypokinesis with mildly reduced function (EF 45-50); mild MR. Compared to 04/12/11, systemic ventricular function is worse.  Scheduled Meds: . busPIRone  15 mg Oral BID  . calcium carbonate  1 tablet Oral BID  . FLUoxetine  20 mg Oral Daily  . folic acid  1 mg Oral Daily  . heparin  5,000 Units Subcutaneous 3 times per day  . LORazepam  1 mg Intravenous 3 times per day  . metoprolol tartrate  50 mg Oral BID  . multivitamin with minerals  1 tablet Oral Daily  . pantoprazole  40 mg Oral Daily  . QUEtiapine  50 mg Oral QHS  . sodium chloride  3 mL Intravenous Q12H  . thiamine  100 mg Oral Daily  . Warfarin - Pharmacist Dosing Inpatient   Does not apply q1800   Continuous Infusions: . diltiazem (CARDIZEM) infusion     PRN Meds:.LORazepam, ondansetron **OR** ondansetron (ZOFRAN) IV   Assessment/Plan:  Active Problems:   Complete transposition of great vessels   Alcohol withdrawal   Tachycardia   Chronic systolic congestive heart failure  Aflutter  - challenging to differentiate sinus tach or atrial flutter with variable conduction on  tele and ecg however his underlying rhythm is atrial flutter with 2:1 conduction.  - On admit HR of 237 equals 1:1 conduction (current HR multiply x 2)   - Metoprolol seems to be working. Continue  - Encourage follow up in future at [facility] for possible ablation  - Last night was probably Aflutter with 1:1 conduction.   - Adenosine did not break (no reentrant rhythm)  - Coumadin. Non compliant.   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   ETOH withdrawal.   - primary team.   Stable from cardiac perspective. Heart rate at home is always 117 since he remains in flutter. OK to DC from heart perspective. Could allow INR to increase as outpatient. Follow up at [facility]. Will sign off.   SKAINS, [place] 07/30/2014, 7:41 AM

## 2014-07-31 ENCOUNTER — Inpatient Hospital Stay (HOSPITAL_COMMUNITY): Payer: Self-pay

## 2014-07-31 LAB — URINE MICROSCOPIC-ADD ON

## 2014-07-31 LAB — COMPREHENSIVE METABOLIC PANEL
ALT: 124 U/L — ABNORMAL HIGH (ref 0–53)
AST: 232 U/L — ABNORMAL HIGH (ref 0–37)
Albumin: 2.7 g/dL — ABNORMAL LOW (ref 3.5–5.2)
Alkaline Phosphatase: 73 U/L (ref 39–117)
Anion gap: 3 — ABNORMAL LOW (ref 5–15)
BUN: 7 mg/dL (ref 6–23)
CO2: 32 mmol/L (ref 19–32)
Calcium: 7.8 mg/dL — ABNORMAL LOW (ref 8.4–10.5)
Chloride: 102 mEq/L (ref 96–112)
Creatinine, Ser: 0.96 mg/dL (ref 0.50–1.35)
GFR calc Af Amer: >90 mL/min (ref >90)
GFR calc non Af Amer: >90 mL/min (ref >90)
Glucose, Bld: 95 mg/dL (ref 70–99)
Potassium: 3.6 mmol/L (ref 3.5–5.1)
Sodium: 137 mmol/L (ref 135–145)
Total Bilirubin: 1.4 mg/dL — ABNORMAL HIGH (ref 0.3–1.2)
Total Protein: 5.7 g/dL — ABNORMAL LOW (ref 6.0–8.3)

## 2014-07-31 LAB — MAGNESIUM: Magnesium: 1.4 mg/dL — ABNORMAL LOW (ref 1.5–2.5)

## 2014-07-31 LAB — CBC
HCT: 34.1 % — ABNORMAL LOW (ref 39.0–52.0)
Hemoglobin: 11.5 g/dL — ABNORMAL LOW (ref 13.0–17.0)
MCH: 35.1 pg — ABNORMAL HIGH (ref 26.0–34.0)
MCHC: 33.7 g/dL (ref 30.0–36.0)
MCV: 104 fL — ABNORMAL HIGH (ref 78.0–100.0)
Platelets: 119 10*3/uL — ABNORMAL LOW (ref 150–400)
RBC: 3.28 MIL/uL — ABNORMAL LOW (ref 4.22–5.81)
RDW: 16.1 % — ABNORMAL HIGH (ref 11.5–15.5)
WBC: 6 10*3/uL (ref 4.0–10.5)

## 2014-07-31 LAB — PROTIME-INR
INR: 1.57 — ABNORMAL HIGH (ref 0.00–1.49)
Prothrombin Time: 18.9 seconds — ABNORMAL HIGH (ref 11.6–15.2)

## 2014-07-31 LAB — URINALYSIS, ROUTINE W REFLEX MICROSCOPIC
Glucose, UA: NEGATIVE mg/dL
Hgb urine dipstick: NEGATIVE
Ketones, ur: NEGATIVE mg/dL
Leukocytes, UA: NEGATIVE
Nitrite: NEGATIVE
Protein, ur: 100 mg/dL — AB
Specific Gravity, Urine: 1.015 (ref 1.005–1.030)
Urobilinogen, UA: 1 mg/dL (ref 0.0–1.0)
pH: 7 (ref 5.0–8.0)

## 2014-07-31 LAB — PHOSPHORUS: Phosphorus: 3.3 mg/dL (ref 2.3–4.6)

## 2014-07-31 MED ORDER — WARFARIN SODIUM 7.5 MG PO TABS
7.5000 mg | ORAL_TABLET | Freq: Once | ORAL | Status: AC
  Administered 2014-07-31: 7.5 mg via ORAL
  Filled 2014-07-31 (×2): qty 1

## 2014-07-31 MED ORDER — MAGNESIUM SULFATE 2 GM/50ML IV SOLN
2.0000 g | Freq: Once | INTRAVENOUS | Status: AC
  Administered 2014-07-31: 2 g via INTRAVENOUS
  Filled 2014-07-31: qty 50

## 2014-07-31 MED ORDER — METOPROLOL TARTRATE 100 MG PO TABS
100.0000 mg | ORAL_TABLET | Freq: Two times a day (BID) | ORAL | Status: DC
  Administered 2014-07-31 – 2014-08-01 (×2): 100 mg via ORAL
  Filled 2014-07-31 (×5): qty 1

## 2014-07-31 MED ORDER — ACETAMINOPHEN 500 MG PO TABS
500.0000 mg | ORAL_TABLET | Freq: Once | ORAL | Status: AC
  Administered 2014-07-31: 500 mg via ORAL
  Filled 2014-07-31: qty 1

## 2014-07-31 MED ORDER — DILTIAZEM HCL ER 90 MG PO CP12
90.0000 mg | ORAL_CAPSULE | Freq: Two times a day (BID) | ORAL | Status: DC
  Administered 2014-07-31 (×2): 90 mg via ORAL
  Filled 2014-07-31 (×6): qty 1

## 2014-07-31 NOTE — Progress Notes (Signed)
Family Practice Teaching Service Interval Progress Note  Received call by RN to report that pt febrile to 101.3. Went to evaluate patient. He was lying in bed comfortably without any concerns. States he is not in pain. Oriented to person and year, thinks it is February 18, thinks he is at an elementary school. Able to sit up and flex legs. Full ROM neck without any nuchal ridigity or meningeal signs. Requests water to drink. abd soft and nontender to palpation, with normoactive bowel sounds.  A/P: 39 yo M in acute alcohol withdrawal, requiring scheduled ativan, now with fever of 101.3. Recent CIWA scores in 6-7 range. Pt has been afebrile thus far during his admission. He does have some altered orientation (thinks he is in an elementary school) but we have a much more obvious reason to explain this (acute alcohol withdrawal) rather than CNS infection. He has no neck stiffness whatsoever and thus I do not feel he needs an LP at this time. Will obtain CXR, blood cx x2, and urinalysis now. Will give one time dose of tylenol 500mg  PO (being cautious with tylenol due to known transaminitis). Discussed with attending Dr. Ree Kida by phone, who agrees with this plan. Will continue to monitor vitals closely in stepdown unit.  Chrisandra Netters, MD Family Medicine PGY-3 Service Pager [phone]

## 2014-07-31 NOTE — Progress Notes (Signed)
Offered bath, Pt refused. Tech will retry later.

## 2014-07-31 NOTE — Progress Notes (Signed)
ANTICOAGULATION CONSULT NOTE - Follow Up Consult  Pharmacy Consult for Coumadin Indication: Hx of Aflutter  Allergies  Allergen Reactions  . Aspirin Other (See Comments)    Unknown    Patient Measurements: Height: 6\' 3"  (190.5 cm) Weight: 192 lb 8 oz (87.317 kg) IBW/kg (Calculated) : 84.5  Vital Signs: Temp: 99 F (37.2 C) (01/17 0807) Temp Source: Oral (01/17 0807) BP: 106/79 mmHg (01/17 0807) Pulse Rate: 142 (01/17 0807)  Labs:  Recent Labs  07/28/14 2307 07/29/14 0407 07/29/14 1100  07/30/14 0332 07/30/14 1155 07/31/14 0310  HGB  --  12.1*  --   --  11.2*  --  11.5*  HCT  --  34.9*  --   --  32.4*  --  34.1*  PLT  --  137*  --   --  105*  --  119*  LABPROT  --   --  14.5  --  15.0  --  18.9*  INR  --   --  1.11  --  1.17  --  1.57*  CREATININE 0.92 0.85  --   < > 0.88 0.82 0.96  TROPONINI <0.03 <0.03  --   --   --   --   --   < > = values in this interval not displayed.  Estimated Creatinine Clearance: 127.1 mL/min (by C-G formula based on Cr of 0.96).  Assessment: 74 YOM w/ PMH of HTN, EtOH abuse, HLD, transposition of the great vessels. Patient is on PTA chronic coumadin and diltiazem for atrial flutter. Patient reports he has been noncompliant with his medications. INR remains low today at 1.57 but starting to trend up. CBC stable. No bleeding noted. Medical team considering NOAC, but continuing coumadin for now.  Goal of Therapy:  INR 2-3 Monitor platelets by anticoagulation protocol: Yes   Plan:  Coumadin 7.5 mg PO x 1 tonight Monitor daily INR, CBC, s/s of bleed D/C heparin SQ when INR therapeutic Hold off on education until decision made about which drug is going to be used.  Kynzlee Hucker J 07/31/2014,9:05 AM

## 2014-07-31 NOTE — Progress Notes (Addendum)
MD on call notified of HR going into the 200's when moving around in bed. MD also aware of pt HR between 114 to 130's resting. Will continue to monitor.

## 2014-07-31 NOTE — Progress Notes (Signed)
Family Medicine Teaching Service Daily Progress Note Intern Pager: [phone]  Patient name: Matthew [name] Medical record number: [id number] Date of birth: [birth year] Age: 39 y.o. Gender: male  Primary Care Provider: Glo Herring., MD Consultants: Cardiology Code Status: full  Pt Overview and Major Events to Date:  1/14: Admit for narrow complex tachycardia  Assessment and Plan:  Matthew [name] is a 39 y.o. male presenting with Sinus tachycardia s/p SVT en route. PMH is significant for alcohol abuse, chronic systolic CHF,   Narrow complex tachycardia: Reported to have been in SVT per EMS with HR 237. S/p Adenosine 6mg  x2 and Diltiazem 20mg  x1. Admits to missing a few days of home diltiazem. Denies CP/SOB, but some ischemic changes on EKG. Troponin neg x3. CXR clear. Likely related to missing medications and alcohol intake. - Diltiazem drip off >>Metoprolol 50mg  BID>> HR currently 110's with occasional jumps to 140's - increase metoprolol to home dose 100mg  BID - add PO diltiazem. Home dose is 180mg  SR BID, will restart 90mg  SR BID, titrate up as able - Monitor on telemetry - Repeat EKG unchanged - Cardiology consulted and signed off 1/16  Alcohol abuse, concern for withdrawal: h/o hallucinations with withdrawal in the past. Last drink around 4am 1/14 per patient report. Patient endorses a recent 2-3 day alcohol binge with no PO intake.Ethanol level - 23 on admission. Prior h/o of withdrawal needing SDU CIWA protocol - currently on scheduled ativan 2mg  q6h with CIWA scores of 5-13 in last 24 hours, requiring 16 mg total of ativan during this time - watch CIWA closely and add additional scheduled or prn if necessary, continue on current regimen now - continue home thiamine and MVI - SW consult for substance abuse - UDS pos for benzos - transaminitis in 2:1 ratio noted with LFT's uptrending, AST 232 and ALT 124 today. Monitor closely, especially in setting of receiving  scheduled ativan. Repeat LFT's tomorrow.  Electrolyte abnormalities: Hypokalemia, Hypocalcemia, Hypomagnesemia: Likely related to diarrhea and decreased PO intake in setting of alcohol abuse. K 3.0 on admission >> 3.6. Ca 6.2 on admission >> 6.7 (7.8 corrected for albumin). Magnesium 0.6 on admission >> 1.0. - s/p multiple repletion doses for Mg, K, Ca.  - Currently Mg 1.4, Ca 8.8 corrected, K 3.6 - replete Mg IV today, repeat labs in AM - add on phos to morning labs, watch for refeeding syndrome but all phos's have been normal thus far - Continue Tums BID  Diarrhea: Likely related to alcohol use. No fevers. - c diff pcr negative  Macrocytosis: MCV 102.6, no anemia. Likely related to alcoholism. - Can consider B12 and folate levels - will get b12 and folate supplementation while on CIWA  Chronic systolic CHF, h/o transposition of great vessels s/p Mustard procedure: Last Echo in 12/2013 showed moderate RVE (systemic ventricle) with severely reduced function, LVEF 45-50%. Followed by cardiology as OP. No signs of volume overload on exam. - Holding home Lasix in setting of dehydration; appears euvolumic today (although note that lasix no longer listed on home meds?) - daily weights, strict Is&Os - Monitor fluid status carefully - Not currently on statin, likely related to elevated transaminases, consider as outpatient if transaminitis improves  HTN: BPs soft O/N but all systolics >229 - Holding home losartan since BP soft - Monitor BP closely >> have been stable  A flutter, on chronic anticoagulation: Missed several weeks of coumadin. INR 1.04 on admit - Coumadin per pharm, INR 1.57 today - Consider NOAC but defer  this decision to outpatient setting  Psych: Depression and anxiety - continue home Prozac, buspar, seroquel  FEN/GI: Heart healthy diet, SLIV Prophylaxis: subq heparin while INR subtherapeutic  Disposition: Pending improvement in arrhythmia, withdrawal, and  electrolytes.  Subjective:  Pt feeling well today, states he's eating well. His heart feels good to him, no complaints.   Objective: Temp:  [97.8 F (36.6 C)-99.2 F (37.3 C)] 99 F (37.2 C) (01/17 0807) Pulse Rate:  [115-159] 142 (01/17 0807) Resp:  [24-31] 29 (01/17 0807) BP: (106-121)/(73-96) 106/79 mmHg (01/17 0807) SpO2:  [97 %-98 %] 97 % (01/17 0807) Weight:  [192 lb 8 oz (87.317 kg)] 192 lb 8 oz (87.317 kg) (01/17 0348) Physical Exam: General: NAD, well developed, lying in bed.  HEENT: NCAT, large beard Cardiovascular: Tachycardic, regular rhythm, 2/6 systolic murmur at LUSB, no g/r. HR 117 on monitor, goes up to 122 when sits up Respiratory: Normal WOB, CTAB, no w/r/c. Abdomen: Soft, NTND, no masses, rebound, guarding.  Extremities: WWP, no edema Neuro:  no tremors noted, speech normal  Laboratory:  Recent Labs Lab 07/29/14 0407 07/30/14 0332 07/31/14 0310  WBC 5.3 3.6* 6.0  HGB 12.1* 11.2* 11.5*  HCT 34.9* 32.4* 34.1*  PLT 137* 105* 119*    Recent Labs Lab 07/30/14 0332 07/30/14 1155 07/31/14 0310  NA 140 138 137  K 3.6 4.4 3.6  CL 107 103 102  CO2 29 27 32  BUN 7 7 7   CREATININE 0.88 0.82 0.96  CALCIUM 7.5* 7.4* 7.8*  PROT 5.3* 5.4* 5.7*  BILITOT 1.0 2.3* 1.4*  ALKPHOS 71 64 73  ALT 74* 93* 124*  AST 120* 210* 232*  GLUCOSE 115* 110* 95    Magnesium: 0.6>>1.3  Troponin 0.00  INR 1.04  Imaging/Diagnostic Tests:  CXR (1/14): No active disease.  EKG (1/14): Sinus tachycardia, left axis deviation, RVH with repol abnormality, T wave inversion and ST depression in lateral leads  Leeanne Rio, MD 07/31/2014, 9:22 AM PGY-3, [place] Intern pager: [phone], text pages welcome

## 2014-08-01 DIAGNOSIS — I270 Primary pulmonary hypertension: Secondary | ICD-10-CM

## 2014-08-01 DIAGNOSIS — F10231 Alcohol dependence with withdrawal delirium: Secondary | ICD-10-CM

## 2014-08-01 DIAGNOSIS — K7010 Alcoholic hepatitis without ascites: Secondary | ICD-10-CM

## 2014-08-01 LAB — COMPREHENSIVE METABOLIC PANEL
ALT: 104 U/L — ABNORMAL HIGH (ref 0–53)
AST: 132 U/L — ABNORMAL HIGH (ref 0–37)
Albumin: 2.9 g/dL — ABNORMAL LOW (ref 3.5–5.2)
Alkaline Phosphatase: 70 U/L (ref 39–117)
Anion gap: 12 (ref 5–15)
BUN: 9 mg/dL (ref 6–23)
CO2: 24 mmol/L (ref 19–32)
Calcium: 7.7 mg/dL — ABNORMAL LOW (ref 8.4–10.5)
Chloride: 102 mEq/L (ref 96–112)
Creatinine, Ser: 1.03 mg/dL (ref 0.50–1.35)
GFR calc Af Amer: >90 mL/min (ref >90)
GFR calc non Af Amer: >90 mL/min (ref >90)
Glucose, Bld: 88 mg/dL (ref 70–99)
Potassium: 3.6 mmol/L (ref 3.5–5.1)
Sodium: 138 mmol/L (ref 135–145)
Total Bilirubin: 1.3 mg/dL — ABNORMAL HIGH (ref 0.3–1.2)
Total Protein: 6 g/dL (ref 6.0–8.3)

## 2014-08-01 LAB — GLUCOSE, CAPILLARY
Glucose-Capillary: 85 mg/dL (ref 70–99)
Glucose-Capillary: 87 mg/dL (ref 70–99)
Glucose-Capillary: 88 mg/dL (ref 70–99)

## 2014-08-01 LAB — MAGNESIUM: Magnesium: 1.3 mg/dL — ABNORMAL LOW (ref 1.5–2.5)

## 2014-08-01 LAB — CBC
HCT: 34.2 % — ABNORMAL LOW (ref 39.0–52.0)
Hemoglobin: 11.5 g/dL — ABNORMAL LOW (ref 13.0–17.0)
MCH: 35.2 pg — ABNORMAL HIGH (ref 26.0–34.0)
MCHC: 33.6 g/dL (ref 30.0–36.0)
MCV: 104.6 fL — ABNORMAL HIGH (ref 78.0–100.0)
Platelets: 126 10*3/uL — ABNORMAL LOW (ref 150–400)
RBC: 3.27 MIL/uL — ABNORMAL LOW (ref 4.22–5.81)
RDW: 16 % — ABNORMAL HIGH (ref 11.5–15.5)
WBC: 7.3 10*3/uL (ref 4.0–10.5)

## 2014-08-01 LAB — PHOSPHORUS: Phosphorus: 2.3 mg/dL (ref 2.3–4.6)

## 2014-08-01 LAB — MRSA PCR SCREENING: MRSA by PCR: NEGATIVE

## 2014-08-01 LAB — PROTIME-INR
INR: 1.65 — ABNORMAL HIGH (ref 0.00–1.49)
Prothrombin Time: 19.7 seconds — ABNORMAL HIGH (ref 11.6–15.2)

## 2014-08-01 MED ORDER — LORAZEPAM 2 MG/ML IJ SOLN
1.0000 mg | INTRAMUSCULAR | Status: DC | PRN
  Administered 2014-08-01 – 2014-08-02 (×4): 2 mg via INTRAVENOUS
  Administered 2014-08-02: 1 mg via INTRAVENOUS
  Administered 2014-08-02: 2 mg via INTRAVENOUS
  Filled 2014-08-01 (×6): qty 1

## 2014-08-01 MED ORDER — DEXMEDETOMIDINE HCL IN NACL 400 MCG/100ML IV SOLN
0.2000 ug/kg/h | INTRAVENOUS | Status: AC
  Administered 2014-08-01: 0.4 ug/kg/h via INTRAVENOUS
  Administered 2014-08-01: 1.1 ug/kg/h via INTRAVENOUS
  Administered 2014-08-01: 0.7 ug/kg/h via INTRAVENOUS
  Administered 2014-08-02: 0.6 ug/kg/h via INTRAVENOUS
  Filled 2014-08-01: qty 100
  Filled 2014-08-01 (×4): qty 50

## 2014-08-01 MED ORDER — MAGNESIUM SULFATE 2 GM/50ML IV SOLN
2.0000 g | Freq: Once | INTRAVENOUS | Status: AC
  Administered 2014-08-01: 2 g via INTRAVENOUS
  Filled 2014-08-01: qty 50

## 2014-08-01 MED ORDER — FENTANYL CITRATE 0.05 MG/ML IJ SOLN: 25.0000 ug | INTRAMUSCULAR | Status: DC | PRN

## 2014-08-01 MED ORDER — FOLIC ACID 1 MG PO TABS
1.0000 mg | ORAL_TABLET | Freq: Every day | ORAL | Status: DC
  Filled 2014-08-01: qty 1

## 2014-08-01 MED ORDER — WARFARIN SODIUM 10 MG PO TABS
10.0000 mg | ORAL_TABLET | Freq: Once | ORAL | Status: DC
  Filled 2014-08-01: qty 1

## 2014-08-01 MED ORDER — METOPROLOL TARTRATE 1 MG/ML IV SOLN: 5.0000 mg | Freq: Three times a day (TID) | INTRAVENOUS | Status: DC | PRN

## 2014-08-01 MED ORDER — ENOXAPARIN SODIUM 100 MG/ML ~~LOC~~ SOLN
85.0000 mg | Freq: Two times a day (BID) | SUBCUTANEOUS | Status: DC
  Administered 2014-08-01 – 2014-08-02 (×3): 85 mg via SUBCUTANEOUS
  Filled 2014-08-01 (×4): qty 1

## 2014-08-01 MED ORDER — VITAMIN B-1 100 MG PO TABS
100.0000 mg | ORAL_TABLET | Freq: Every day | ORAL | Status: DC
  Filled 2014-08-01: qty 1

## 2014-08-01 MED ORDER — POTASSIUM CHLORIDE CRYS ER 20 MEQ PO TBCR
40.0000 meq | EXTENDED_RELEASE_TABLET | Freq: Once | ORAL | Status: AC
  Administered 2014-08-01: 40 meq via ORAL
  Filled 2014-08-01: qty 2

## 2014-08-01 MED ORDER — THIAMINE HCL 100 MG/ML IJ SOLN
100.0000 mg | Freq: Every day | INTRAMUSCULAR | Status: DC
  Administered 2014-08-01 – 2014-08-02 (×2): 100 mg via INTRAVENOUS
  Filled 2014-08-01 (×2): qty 1

## 2014-08-01 MED ORDER — ADULT MULTIVITAMIN W/MINERALS CH
1.0000 | ORAL_TABLET | Freq: Every day | ORAL | Status: DC
  Administered 2014-08-02 – 2014-08-05 (×4): 1 via ORAL
  Filled 2014-08-01 (×5): qty 1

## 2014-08-01 MED ORDER — FOLIC ACID 5 MG/ML IJ SOLN
1.0000 mg | Freq: Every day | INTRAMUSCULAR | Status: DC
  Administered 2014-08-01 – 2014-08-02 (×2): 1 mg via INTRAVENOUS
  Filled 2014-08-01 (×2): qty 0.2

## 2014-08-01 MED ORDER — PANTOPRAZOLE SODIUM 40 MG IV SOLR
40.0000 mg | INTRAVENOUS | Status: DC
  Administered 2014-08-01: 40 mg via INTRAVENOUS
  Filled 2014-08-01: qty 40

## 2014-08-01 NOTE — Progress Notes (Addendum)
Call from resident Dr Chrisandra Netters  Patient has received signifcant amount of ativan - currently tachycardic and high CIWA score of 27 Patient is in non camera bed of SDU  A Severe DT  P Agree with her rec for ICU transfer PCCM now primary STart precedex gtt PCCM bedside MD to evaluate NPO   Dr. Brand Males, M.D., [facility].C.P Pulmonary and Critical Care Medicine Staff Physician [place] [facility] Pager: [phone], If no answer or between  15:00h - 7:00h: call [phone]  08/01/2014 6:22 AM

## 2014-08-01 NOTE — Progress Notes (Addendum)
Patient is disoriented, restless, at times combative, getting out of bed and pulling off telemetry leads; demanding that he go home.  He has obvious hallucinations claiming people are in his room. Have notified MD of situation.  Will continue to monitor and reorient.

## 2014-08-01 NOTE — Progress Notes (Addendum)
ANTICOAGULATION CONSULT NOTE - Follow Up Consult  Pharmacy Consult for Coumadin->Lovenox in case procedures needed Indication: Hx of Aflutter  Allergies  Allergen Reactions  . Aspirin Other (See Comments)    Unknown    Patient Measurements: Height: 6\' 3"  (190.5 cm) Weight: 192 lb 8 oz (87.317 kg) IBW/kg (Calculated) : 84.5  Vital Signs: Temp: 98.3 F (36.8 C) (01/18 0325) Temp Source: Oral (01/18 0325) BP: 115/92 mmHg (01/18 0800) Pulse Rate: 116 (01/18 0325)  Labs:  Recent Labs  07/30/14 0332 07/30/14 1155 07/31/14 0310 08/01/14 0345  HGB 11.2*  --  11.5* 11.5*  HCT 32.4*  --  34.1* 34.2*  PLT 105*  --  119* 126*  LABPROT 15.0  --  18.9* 19.7*  INR 1.17  --  1.57* 1.65*  CREATININE 0.88 0.82 0.96 1.03    Estimated Creatinine Clearance: 118.5 mL/min (by C-G formula based on Cr of 1.03).  Assessment: 38 YOM w/ PMH of HTN, EtOH abuse, HLD, transposition of the great vessels. Patient is on PTA chronic coumadin and diltiazem for atrial flutter. Patient reports he has been noncompliant with his medications. INR remains low today at 1.65 but trending up. Plan to hold coumadin and change to full-dose Lovenox in case pt needs procedures. CBC stable. No bleeding noted. Medical team considering NOAC, but continuing coumadin for now.  Goal of Therapy:  INR 2-3 Monitor platelets by anticoagulation protocol: Yes   Plan:  Lovenox 85 mg SQ q12h Monitor daily INR, CBC, s/s of bleed Hold off on education until decision made about which drug is going to be used.  Sherlon Handing, PharmD, BCPS Clinical pharmacist, pager [phone] 08/01/2014,8:02 AM

## 2014-08-01 NOTE — H&P (Signed)
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  He drinks 2 gallons of vodka or whiskey in 1 week last drink Weds prior to admission transferred to [place] with alcohol withdrawal despite a signficant amount of Ativan and ST.    STUDIES:  1/17 CXR 1. Mild bronchial wall thickening as described which could reflect acute bronchitis or be chronic. Is mildly increased from prior studies. No evidence of lobar pneumonia. Minimal left effusion. Findings could be due to mild interstitial edema.  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  He drinks 2 gallons of vodka or whiskey in 1 week last drink Weds prior to admission transferred to [place] with alcohol withdrawal despite a signficant amount of Ativan and ST.    PAST MEDICAL HISTORY :   has a past medical history of Complete transposition of great vessels; Hypertension; Dyslipidemia; Depression; Alcohol abuse; and [facility] spotted fever (11/2013).  has past surgical history that includes volar plate arthroplasty, right small finger proxima linterphalangeal joint; Septostomy; and mustard procedure. Prior to Admission medications   Medication Sig Start Date End Date Taking? Authorizing Provider  acetaminophen (TYLENOL) 500 MG tablet Take 1,000 mg by mouth every 6 (six) hours as needed for headache.   Yes Historical Provider, MD  ALPRAZolam Duanne Moron) 0.5 MG tablet Take 1  tablet (0.5 mg total) by mouth 3 (three) times daily as needed for anxiety. Patient taking differently: Take 0.5 mg by mouth 2 (two) times daily as needed for anxiety.  01/05/14  Yes Geradine Girt, DO  busPIRone (BUSPAR) 15 MG tablet Take 15 mg by mouth 2 (two) times daily.   Yes Historical Provider, MD  diltiazem (CARDIZEM SR) 90 MG 12 hr capsule TAKE TWO CAPSULES BY MOUTH EVERY 12 HOURS **NEEDS  APPOINTMENT** Patient taking differently: Take 180 mg by mouth every 12 hours 07/26/14  Yes Fay Records, MD  FLUoxetine (PROZAC) 20 MG capsule Take 20 mg by mouth daily.   Yes Historical Provider, MD  folic acid (FOLVITE) 1 MG tablet Take 1 tablet (1 mg total) by mouth daily. 01/05/14  Yes Geradine Girt, DO  losartan (COZAAR) 50 MG tablet Take 1 tablet (50 mg total) by mouth daily. 04/23/14  Yes Erline Hau, MD  metoprolol (LOPRESSOR) 100 MG tablet Take 1 tablet (100 mg total) by mouth 2 (two) times daily. 04/23/14  Yes Erline Hau, MD  Multiple Vitamin (MULTIVITAMIN WITH MINERALS) TABS tablet Take 1 tablet by mouth daily. 04/23/14  Yes Erline Hau, MD  pantoprazole (PROTONIX) 40 MG tablet Take 40 mg by mouth daily.   Yes Historical Provider, MD  potassium chloride SA (KLOR-CON M20) 20 MEQ tablet Take 1 tablet (20 mEq total) by mouth daily. 01/17/14  Yes Imogene Burn, PA-C  QUEtiapine (SEROQUEL XR) 50 MG TB24 24 hr tablet Take 1 tablet (50 mg total) by mouth at bedtime. 01/05/14  Yes Geradine Girt, DO  thiamine 100 MG tablet Take  1 tablet (100 mg total) by mouth daily. 04/23/14  Yes Erline Hau, MD  warfarin (COUMADIN) 7.5 MG tablet Take 1 tablet (7.5 mg total) by mouth daily at 6 PM. 04/23/14  Yes Estela Leonie Green, MD  feeding supplement, ENSURE COMPLETE, (ENSURE COMPLETE) LIQD Take 237 mLs by mouth 2 (two) times daily between meals. Patient not taking: Reported on 07/28/2014 01/05/14   Geradine Girt, DO  furosemide (LASIX) 20 MG tablet Take  1 tablet (20 mg total) by mouth daily. Patient not taking: Reported on 07/28/2014 01/17/14   Imogene Burn, PA-C   Allergies  Allergen Reactions  . Aspirin Other (See Comments)    Unknown    FAMILY HISTORY:  indicated that his mother is alive. He indicated that his father is alive.  SOCIAL HISTORY:  reports that he has never smoked. He does not have any smokeless tobacco history on file. He reports that he drinks alcohol. He reports that he does not use illicit drugs.  REVIEW OF SYSTEMS:   HEENT: +nasal drainage  Cardiac: +palpitatations  Pulm: denies sob Abd: denies ab pain Ext: denies leg edema  Neuro: +visual hallucinations of animals yesterday  SUBJECTIVE:  Denies issues this am   VITAL SIGNS: Temp:  [98.3 F (36.8 C)-101.3 F (38.5 C)] 98.3 F (36.8 C) (01/18 0325) Pulse Rate:  [116-142] 116 (01/18 0325) Resp:  [23-91] 37 (01/18 0650) BP: (100-122)/(66-84) 104/80 mmHg (01/18 0650) SpO2:  [92 %-100 %] 92 % (01/18 0650) HEMODYNAMICS:   VENTILATOR SETTINGS:   INTAKE / OUTPUT:  Intake/Output Summary (Last 24 hours) at 08/01/14 0716 Last data filed at 07/31/14 2040  Gross per 24 hour  Intake    893 ml  Output    900 ml  Net     -7 ml    PHYSICAL EXAMINATION: General:  Lying in bed, nad Neuro:  Horizontal nystagmus  HEENT:  Dilated pupils reactive to light Cardiovascular:  Sinus tachycardia, no murmurs  Lungs:  ctab Abdomen:  Soft, obese, ntnd, nl bs  Musculoskeletal: nl tone Skin:  Intact   LABS:  CBC  Recent Labs Lab 07/30/14 0332 07/31/14 0310 08/01/14 0345  WBC 3.6* 6.0 7.3  HGB 11.2* 11.5* 11.5*  HCT 32.4* 34.1* 34.2*  PLT 105* 119* 126*   Coag's  Recent Labs Lab 07/30/14 0332 07/31/14 0310 08/01/14 0345  INR 1.17 1.57* 1.65*   BMET  Recent Labs Lab 07/30/14 1155 07/31/14 0310 08/01/14 0345  NA 138 137 138  K 4.4 3.6 3.6  CL 103 102 102  CO2 27 32 24  BUN 7 7 9   CREATININE 0.82 0.96 1.03  GLUCOSE 110* 95 88    Electrolytes  Recent Labs Lab 07/30/14 1155 07/31/14 0310 07/31/14 1000 08/01/14 0345  CALCIUM 7.4* 7.8*  --  7.7*  MG 1.3* 1.4*  --  1.3*  PHOS 2.6  --  3.3 2.3   Sepsis Markers No results for input(s): LATICACIDVEN, PROCALCITON, O2SATVEN in the last 168 hours. ABG No results for input(s): PHART, PCO2ART, PO2ART in the last 168 hours. Liver Enzymes  Recent Labs Lab 07/30/14 1155 07/31/14 0310 08/01/14 0345  AST 210* 232* 132*  ALT 93* 124* 104*  ALKPHOS 64 73 70  BILITOT 2.3* 1.4* 1.3*  ALBUMIN 2.7* 2.7* 2.9*   Cardiac Enzymes  Recent Labs Lab 07/28/14 2307 07/29/14 0407  TROPONINI <0.03 <0.03   Glucose  Recent Labs Lab 08/01/14 0707  GLUCAP 85    Imaging Dg Chest 2 View  07/31/2014   CLINICAL DATA:  Altered mental status. Patient and called fall. History hypertension. Fever.  EXAM: CHEST  2 VIEW  COMPARISON:  07/28/2014  FINDINGS: Changes from median sternotomy are stable. Cardiac silhouette is normal in size. No mediastinal or hilar masses or evidence of adenopathy.  There is central and medial lower lobe bronchial wall thickening as well as mild interstitial thickening. This appears increased when compared to prior exams. No focal lung consolidation. Minimal left effusion. No pneumothorax.  IMPRESSION: 1. Mild bronchial wall thickening as described which could reflect acute bronchitis or be chronic. Is mildly increased from prior studies. No evidence of lobar pneumonia. Minimal left effusion. Findings could be due to mild interstitial edema.   Electronically Signed   By: Lajean Manes M.D.   On: 07/31/2014 22:27     ASSESSMENT / PLAN:  PULMONARY OETT none A: No issues  Severe pulmonary HTN with hypoxia (h/o PAP 111) P:   Prn O2 sats >90 On [place] 6L to venti mask to bipap currently  CXR in am  CARDIOVASCULAR CVL none A:  Sinus tachycardia  H/o SVT H/o transposition s/p surgery  H/o Atrial flutter previously on Coumadin INR subtherapeutic   Chronic systolic HF (EF 65-68% 07/2749) P:  Dilt 90 bid (will d/c unable to tolerate po), Lopressor 100 bid  (will hold oral and change to Lopressor 5 mg tid prn HR> 110) Coumadin per pharm will d/c and change to Lovenox therapeutic  Monitor INR  Needs to f/u with cardiology outpatient   RENAL A:   K 3.6  Mag 1.8  P:   Monitor electrolytes, replete prn  Mag 2 g x , Kdur 40 x 1  GASTROINTESTINAL A:   GI px  Transaminitis acute alcoholic hepatitis  Mod Malnutrition NPO  P:   Prn Zofran  Protonix 40 mg qd po will change to iv (unable to tolerate po) Trend CMET   HEMATOLOGIC A:   Thrombocytopenia likely 2/2 alcohol  Macrocytic anemia DVT px  P:  Heparin 5K sq will dc change to Lovenox, monitor plts   INFECTIOUS A:   Fever, resolved   P:   BCx2  Pending 1/17 UC not obtained  Sputum not obtained  MRSA negative  1/14 C.diff negative  Abx: none   Prn Tylenol   ENDOCRINE A:   No issues   P:   None   NEUROLOGIC A:   Alcohol w/d H/o Alcoholism  Anxiety/Depression  P:   RASS goal: none Prn Tylenol, Fentanyl 25 mcg iv q2 prn  Will d/c  Prozac 20 mg qd (will d/c unable to tolerate po) Folic acid, mvt, thiamine  Buspar 15 mg bid (will d/c unable to tolerate po) Seroquel XR 50 mg qd (will d/c unable to tolerate po) Precedex gtt (try to titrate off and not max >0.8), Ativan 1-4 mg q1 prn (use as 1st line), Ativan 2 mg q6  Monitor ciwa      FAMILY  - Updates: will update when available   - Inter-disciplinary family meet or Palliative Care meeting due by:  08/08/14     TODAY'S SUMMARY: Alcohol w/d with Precedex and Ativan, H/o Atrial flutter on Coumadin per pharmacy will d/c and put on Lovenox for 24 hours until able to better tolerate po, will monitor for signs of alcohol withdrawal until stable   ATTENDING NOTE: I have personally reviewed patient's available data, including medical history, events of note, physical examination and test results as part  of my evaluation. I  have discussed with resident/NP and other careteam providers such as pharmacist, RN and RRT & co-ordinated with consultants. In addition, I personally evaluated patient and elicited key history of drinks a gallon of vodka per week, history of prior withdrawal, congenital heart defect status post surgery with residual severe pulmonary hypertension-RVSP 111 on last echo, exam findings of confused mildly agitated, oriented to place and time, mild hypoxia initially requiring nasal cannula, then C Pap once he was sedated with Ativan- labs showing  no significant leukocytosis, mild elevated LFTs, elevated INR due to Coumadin.  Recommend-titrate Precedex to a maximum 0.8, continue to use Ativan 1-4 mg when necessary, Use C Pap as tolerated during sleep May need cardiology follow-up-for pulmonary hypertension Elevated LFTs probably due to alcoholic hepatitis-not a good candidate for endothelin antagonists Rest per medical resident whose note is outlined above and that I agree with and edited in full.   Care during the described time interval was provided by me and/or other providers on the critical care team.  I have reviewed this patient's available data, including medical history, events of note, physical examination and test results as part of my evaluation  CC time x 12m  ALVA,RAKESH V. MD   08/01/2014, 7:16 AM

## 2014-08-01 NOTE — Progress Notes (Signed)
Patient is very agitated and more confused.  Scheduled Ativan was given prior to all this confusion.  He has been trying to get out of bed.  Patient's mother was asked to leave because patient started yelling at her.  Mother left and went home.  Later this evening, patient tried to get out of bed stating that he needs to check on his children.  He has made several attempts to leave the hospital.  He is still aware that he is in the hospital.  He smells urine.  Refused to take off his pajama but he did wipe his private parts.  Report given to oncoming shift.

## 2014-08-02 ENCOUNTER — Inpatient Hospital Stay (HOSPITAL_COMMUNITY): Payer: MEDICAID

## 2014-08-02 LAB — COMPREHENSIVE METABOLIC PANEL
ALT: 87 U/L — ABNORMAL HIGH (ref 0–53)
AST: 85 U/L — ABNORMAL HIGH (ref 0–37)
Albumin: 2.7 g/dL — ABNORMAL LOW (ref 3.5–5.2)
Alkaline Phosphatase: 78 U/L (ref 39–117)
Anion gap: 12 (ref 5–15)
BUN: 6 mg/dL (ref 6–23)
CO2: 25 mmol/L (ref 19–32)
Calcium: 7.8 mg/dL — ABNORMAL LOW (ref 8.4–10.5)
Chloride: 103 mEq/L (ref 96–112)
Creatinine, Ser: 0.97 mg/dL (ref 0.50–1.35)
GFR calc Af Amer: >90 mL/min (ref >90)
GFR calc non Af Amer: >90 mL/min (ref >90)
Glucose, Bld: 87 mg/dL (ref 70–99)
Potassium: 3.4 mmol/L — ABNORMAL LOW (ref 3.5–5.1)
Sodium: 140 mmol/L (ref 135–145)
Total Bilirubin: 1.7 mg/dL — ABNORMAL HIGH (ref 0.3–1.2)
Total Protein: 5.7 g/dL — ABNORMAL LOW (ref 6.0–8.3)

## 2014-08-02 LAB — CBC
HCT: 35.2 % — ABNORMAL LOW (ref 39.0–52.0)
Hemoglobin: 12.1 g/dL — ABNORMAL LOW (ref 13.0–17.0)
MCH: 35.9 pg — ABNORMAL HIGH (ref 26.0–34.0)
MCHC: 34.4 g/dL (ref 30.0–36.0)
MCV: 104.5 fL — ABNORMAL HIGH (ref 78.0–100.0)
Platelets: 144 10*3/uL — ABNORMAL LOW (ref 150–400)
RBC: 3.37 MIL/uL — ABNORMAL LOW (ref 4.22–5.81)
RDW: 15.7 % — ABNORMAL HIGH (ref 11.5–15.5)
WBC: 6.6 10*3/uL (ref 4.0–10.5)

## 2014-08-02 LAB — GLUCOSE, CAPILLARY: Glucose-Capillary: 75 mg/dL (ref 70–99)

## 2014-08-02 LAB — PROTIME-INR
INR: 2.07 — ABNORMAL HIGH (ref 0.00–1.49)
Prothrombin Time: 23.5 seconds — ABNORMAL HIGH (ref 11.6–15.2)

## 2014-08-02 LAB — MAGNESIUM: Magnesium: 1.6 mg/dL (ref 1.5–2.5)

## 2014-08-02 MED ORDER — WARFARIN SODIUM 7.5 MG PO TABS
7.5000 mg | ORAL_TABLET | Freq: Once | ORAL | Status: AC
  Administered 2014-08-02: 7.5 mg via ORAL
  Filled 2014-08-02: qty 1

## 2014-08-02 MED ORDER — WARFARIN - PHARMACIST DOSING INPATIENT: Freq: Every day | Status: DC

## 2014-08-02 MED ORDER — PHENOL 1.4 % MT LIQD
1.0000 | OROMUCOSAL | Status: DC | PRN
  Administered 2014-08-02: 1 via OROMUCOSAL
  Filled 2014-08-02: qty 177

## 2014-08-02 MED ORDER — BUSPIRONE HCL 15 MG PO TABS
15.0000 mg | ORAL_TABLET | Freq: Two times a day (BID) | ORAL | Status: DC
  Administered 2014-08-02 – 2014-08-05 (×7): 15 mg via ORAL
  Filled 2014-08-02 (×8): qty 1

## 2014-08-02 MED ORDER — PANTOPRAZOLE SODIUM 40 MG PO TBEC
40.0000 mg | DELAYED_RELEASE_TABLET | Freq: Every day | ORAL | Status: DC
  Administered 2014-08-02 – 2014-08-05 (×4): 40 mg via ORAL
  Filled 2014-08-02 (×4): qty 1

## 2014-08-02 MED ORDER — FLUOXETINE HCL 20 MG PO CAPS
20.0000 mg | ORAL_CAPSULE | Freq: Every day | ORAL | Status: DC
  Administered 2014-08-02 – 2014-08-05 (×4): 20 mg via ORAL
  Filled 2014-08-02 (×4): qty 1

## 2014-08-02 MED ORDER — QUETIAPINE FUMARATE ER 50 MG PO TB24
50.0000 mg | ORAL_TABLET | Freq: Every day | ORAL | Status: DC
  Administered 2014-08-02 – 2014-08-04 (×3): 50 mg via ORAL
  Filled 2014-08-02 (×4): qty 1

## 2014-08-02 MED ORDER — POTASSIUM CHLORIDE 10 MEQ/100ML IV SOLN
10.0000 meq | INTRAVENOUS | Status: AC
  Administered 2014-08-02 (×4): 10 meq via INTRAVENOUS
  Filled 2014-08-02 (×4): qty 100

## 2014-08-02 MED ORDER — VITAMIN B-1 100 MG PO TABS
100.0000 mg | ORAL_TABLET | Freq: Every day | ORAL | Status: DC
  Administered 2014-08-03 – 2014-08-05 (×3): 100 mg via ORAL
  Filled 2014-08-02 (×3): qty 1

## 2014-08-02 MED ORDER — MAGNESIUM SULFATE 2 GM/50ML IV SOLN
2.0000 g | Freq: Once | INTRAVENOUS | Status: AC
  Administered 2014-08-02: 2 g via INTRAVENOUS
  Filled 2014-08-02: qty 50

## 2014-08-02 MED ORDER — DILTIAZEM HCL 60 MG PO TABS
60.0000 mg | ORAL_TABLET | Freq: Three times a day (TID) | ORAL | Status: DC
  Administered 2014-08-02 – 2014-08-04 (×6): 60 mg via ORAL
  Filled 2014-08-02 (×9): qty 1

## 2014-08-02 MED ORDER — FOLIC ACID 1 MG PO TABS
1.0000 mg | ORAL_TABLET | Freq: Every day | ORAL | Status: DC
  Administered 2014-08-03 – 2014-08-05 (×3): 1 mg via ORAL
  Filled 2014-08-02 (×3): qty 1

## 2014-08-02 MED ORDER — METOPROLOL TARTRATE 50 MG PO TABS
50.0000 mg | ORAL_TABLET | Freq: Two times a day (BID) | ORAL | Status: DC
  Administered 2014-08-02 – 2014-08-05 (×6): 50 mg via ORAL
  Filled 2014-08-02 (×8): qty 1

## 2014-08-02 MED ORDER — POTASSIUM CHLORIDE CRYS ER 20 MEQ PO TBCR
40.0000 meq | EXTENDED_RELEASE_TABLET | Freq: Once | ORAL | Status: AC
  Administered 2014-08-02: 40 meq via ORAL
  Filled 2014-08-02: qty 2

## 2014-08-02 NOTE — Progress Notes (Signed)
**Note De-Identified  Obfuscation** Patient removed from BIPAP and placed on 2L [place].  RT to continue to monitor

## 2014-08-02 NOTE — Progress Notes (Addendum)
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 He drinks 2 gallons of vodka or whiskey in 1 week last drink Weds prior to admission transferred to [place] with alcohol withdrawal despite a signficant amount of Ativan and ST.   STUDIES:  1/17 CXR 1. Mild bronchial wall thickening as described which could reflect acute bronchitis or be chronic. Is mildly increased from prior studies. No evidence of lobar pneumonia. Minimal left effusion. Findings could be due to mild interstitial edema.  1/19  CXR 1. Cardiomegaly with mild interstitial prominence. Congestive heart failure cannot be excluded. Small left pleural effusion. 2. Left lower lobe atelectasis and/or infiltrate.  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 He drinks 2 gallons of vodka or whiskey in 1 week last drink Weds prior to admission transferred to [place] with alcohol withdrawal despite a signficant amount of Ativan and ST.   PAST MEDICAL HISTORY :   has a past medical history of Complete transposition of great vessels; Hypertension; Dyslipidemia; Depression; Alcohol abuse; and [facility] spotted fever (11/2013).  has past surgical history that includes volar plate arthroplasty, right small finger proxima linterphalangeal joint; Septostomy; and mustard procedure. Prior to Admission medications   Medication Sig Start Date End Date Taking?  Authorizing Provider  acetaminophen (TYLENOL) 500 MG tablet Take 1,000 mg by mouth every 6 (six) hours as needed for headache.   Yes Historical Provider, MD  ALPRAZolam Duanne Moron) 0.5 MG tablet Take 1 tablet (0.5 mg total) by mouth 3 (three) times daily as needed for anxiety. Patient taking differently: Take 0.5 mg by mouth 2 (two) times daily as needed for anxiety.  01/05/14  Yes Geradine Girt, DO  busPIRone (BUSPAR) 15 MG tablet Take 15 mg by mouth 2 (two) times daily.   Yes Historical Provider, MD  diltiazem (CARDIZEM SR) 90 MG 12 hr capsule TAKE TWO CAPSULES BY MOUTH EVERY 12 HOURS **NEEDS  APPOINTMENT** Patient taking differently: Take 180 mg by mouth every 12 hours 07/26/14  Yes Fay Records, MD  FLUoxetine (PROZAC) 20 MG capsule Take 20 mg by mouth daily.   Yes Historical Provider, MD  folic acid (FOLVITE) 1 MG tablet Take 1 tablet (1 mg total) by mouth daily. 01/05/14  Yes Geradine Girt, DO  losartan (COZAAR) 50 MG tablet Take 1 tablet (50 mg total) by mouth daily. 04/23/14  Yes Erline Hau, MD  metoprolol (LOPRESSOR) 100 MG tablet Take 1 tablet (100 mg total) by mouth 2 (two) times daily. 04/23/14  Yes Erline Hau, MD  Multiple Vitamin (MULTIVITAMIN WITH MINERALS) TABS tablet Take 1 tablet by mouth daily. 04/23/14  Yes Erline Hau, MD  pantoprazole (PROTONIX) 40 MG tablet Take 40 mg by mouth daily.   Yes Historical Provider, MD  potassium chloride SA (KLOR-CON M20) 20 MEQ tablet Take 1 tablet (20 mEq total) by mouth daily. 01/17/14  Yes Imogene Burn, PA-C  QUEtiapine (SEROQUEL XR) 50 MG TB24 24 hr tablet  Take 1 tablet (50 mg total) by mouth at bedtime. 01/05/14  Yes Geradine Girt, DO  thiamine 100 MG tablet Take 1 tablet (100 mg total) by mouth daily. 04/23/14  Yes Erline Hau, MD  warfarin (COUMADIN) 7.5 MG tablet Take 1 tablet (7.5 mg total) by mouth daily at 6 PM. 04/23/14  Yes Estela Leonie Green, MD  feeding supplement, ENSURE  COMPLETE, (ENSURE COMPLETE) LIQD Take 237 mLs by mouth 2 (two) times daily between meals. Patient not taking: Reported on 07/28/2014 01/05/14   Geradine Girt, DO  furosemide (LASIX) 20 MG tablet Take 1 tablet (20 mg total) by mouth daily. Patient not taking: Reported on 07/28/2014 01/17/14   Imogene Burn, PA-C   Allergies  Allergen Reactions  . Aspirin Other (See Comments)    Unknown    FAMILY HISTORY:  indicated that his mother is alive. He indicated that his father is alive.  SOCIAL HISTORY:  reports that he has never smoked. He does not have any smokeless tobacco history on file. He reports that he drinks alcohol. He reports that he does not use illicit drugs.  REVIEW OF SYSTEMS:   HEENT:+dry mouth Abd: denies ab pain  Ext: denies leg edema  Neuro: denies hallucinations   SUBJECTIVE:  +dry mouth and sore throat  RN: incontinent overnight refused condom cath, urinating ok, bipap overnight with sats 90-93% at one time, no n/v, following commands oriented to place but does not know why here.   VITAL SIGNS: Temp:  [97.6 F (36.4 C)-99.6 F (37.6 C)] 98 F (36.7 C) (01/19 0421) Pulse Rate:  [109-118] 117 (01/19 0600) Resp:  [18-41] 22 (01/19 0600) BP: (104-131)/(80-101) 131/101 mmHg (01/19 0600) SpO2:  [79 %-100 %] 98 % (01/19 0600) FiO2 (%):  [40 %-55 %] 40 % (01/19 0600) Weight:  [184 lb 8.4 oz (83.7 kg)] 184 lb 8.4 oz (83.7 kg) (01/19 0500) HEMODYNAMICS:   VENTILATOR SETTINGS: Vent Mode:  [-]  FiO2 (%):  [40 %-55 %] 40 % INTAKE / OUTPUT:  Intake/Output Summary (Last 24 hours) at 08/02/14 0625 Last data filed at 08/02/14 0605  Gross per 24 hour  Intake 490.13 ml  Output     35 ml  Net 455.13 ml    PHYSICAL EXAMINATION: General:  Lying in bed, nad  Neuro:  Awake, interactive, eyes open, follows commands  HEENT:  [place]/at Cardiovascular:  ST, no murmurs Lungs:  ctab Abdomen:  bs +, ntnd, soft  Musculoskeletal:  Intact  Skin:  Intact   LABS:  CBC  Recent  Labs Lab 07/31/14 0310 08/01/14 0345 08/02/14 0217  WBC 6.0 7.3 6.6  HGB 11.5* 11.5* 12.1*  HCT 34.1* 34.2* 35.2*  PLT 119* 126* 144*   Coag's  Recent Labs Lab 07/31/14 0310 08/01/14 0345 08/02/14 0217  INR 1.57* 1.65* 2.07*   BMET  Recent Labs Lab 07/31/14 0310 08/01/14 0345 08/02/14 0217  NA 137 138 140  K 3.6 3.6 3.4*  CL 102 102 103  CO2 32 24 25  BUN 7 9 6   CREATININE 0.96 1.03 0.97  GLUCOSE 95 88 87   Electrolytes  Recent Labs Lab 07/30/14 1155 07/31/14 0310 07/31/14 1000 08/01/14 0345 08/02/14 0217  CALCIUM 7.4* 7.8*  --  7.7* 7.8*  MG 1.3* 1.4*  --  1.3* 1.6  PHOS 2.6  --  3.3 2.3  --    Sepsis Markers No results for input(s): LATICACIDVEN, PROCALCITON, O2SATVEN in the last 168 hours. ABG No results for  input(s): PHART, PCO2ART, PO2ART in the last 168 hours. Liver Enzymes  Recent Labs Lab 07/31/14 0310 08/01/14 0345 08/02/14 0217  AST 232* 132* 85*  ALT 124* 104* 87*  ALKPHOS 73 70 78  BILITOT 1.4* 1.3* 1.7*  ALBUMIN 2.7* 2.9* 2.7*   Cardiac Enzymes  Recent Labs Lab 07/28/14 2307 07/29/14 0407  TROPONINI <0.03 <0.03   Glucose  Recent Labs Lab 08/01/14 0707 08/01/14 1602 08/01/14 1918  GLUCAP 85 87 88    Imaging No results found.   ASSESSMENT / PLAN:  PULMONARY OETT none A: No clinical signs of pna Severe pulmonary HTN with hypoxia (h/o PAP 111) likely idopathic 2/2 congential heart disease  ?OSA P:   Prn O2 sats >90 on bipap will try off with cpap qhs  Will need to f/u with cardiology oupt, will need outpt sleep study  Until more compliant with meds with avoid meds for pulm HTN currently.  Transfer to [facility] service in am   CARDIOVASCULAR CVL none A:  Sinus tachycardia  H/o SVT 1/14 H/o transposition s/p surgery  H/o Atrial flutter previously on Coumadin INR subtherapeutic and noncompliant with meds  Chronic systolic HF (EF 16-10% 03/6044) HTN hx with soft bps P:  Held Dilt 90 bid and Lopressor 100 bid  (home meds), will start Dilt 60 tid and Lopressor 50 mg bid, d/c iv lopressor hold if sbp<90 Lovenox therapeutic will d/c and resume Coumadin today Monitor INR  Needs to f/u with cardiology outpatient   RENAL A:   K 3.4 Mag 1.6  P:   Monitor electrolytes and replete prn K 4 runs and 40 po x1, Mag 2 gx 1   GASTROINTESTINAL A:   GI px  Transaminitis acute alcoholic hepatitis, lfts downtrending Elevated T bili Mod Malnutrition NPO P:   Prn Zofran  Protonix 40 mg qd po Trend CMET  Add soft diet today  HEMATOLOGIC A:   Thrombocytopenia likely 2/2 alcohol  Macrocytic anemia DVT px  P:  Lovenox d/c change to coumadin, monitor plts    INFECTIOUS A:   Fever resolved  P:   BCx2  1/17 pending  UC not obtained  Sputum not obtained MRSA neg  Abx: none  Monitory clinically. No indication Abx   ENDOCRINE A:   No issues  P:   none  NEUROLOGIC A:   Alcohol w/d H/o Alcoholism and dependence Anxiety/Depression P:   RASS goal: 0 now 0 to -1 Resume Prozac 20 mg qd, Buspar 15 mg bid, Seroquel XR 50 mg qd Folic acid, mvt, thiamine  Precedex gtt (try to titrate off and <0.8), Ativan 1-4 mg q1 prn (use as 1st line), Ativan 2 mg q6  Monitor ciwa  Social work consulted   FAMILY  - Updates: mother Crystal 1/18  - Inter-disciplinary family meet or Palliative Care meeting due by:  08/08/14     TODAY'S SUMMARY: Alcohol withdrawal monitoring try to wean off Precedex, continue Ativan, alcoholic hepatitis monitor lfts.  Monitor VS. Consult social work for substance    08/02/2014, 6:25 AM  ATTENDING NOTE: I have personally reviewed patient's available data, including medical history, events of note, physical examination and test results as part of my evaluation. I have discussed with resident/NP and other careteam providers such as pharmacist, RN and RRT & co-ordinated with consultants. In addition, I personally evaluated patient and elicited key history of heavy  alcohol abuse, alcohol withdrawal with delirium and tremors, severe pulmonary hypertension-due to congenital heart disease, exam findings of more awake and  alert, interactive, clear lungs & labs showing hypomagnesemia-corrected, elevated LFTs-improving  Impression-severe alcohol withdrawal Severe pulmonary hypertension -Eisenmenger Likely OSA   Recommend- wean off Precedex, converted to Ativan as needed Cardiology evaluation- once he is sober, and acute issues resolved. Not a candidate for endothelin antagonists due to elevated LFTs. Probably, need sleep evaluation as outpatient  Rest per medical resident whose note is outlined above and that I agree with and edited in full.   Care during the described time interval was provided by me and/or other providers on the critical care team.  I have reviewed this patient's available data, including medical history, events of note, physical examination and test results as part of my evaluation  CC time x 40 m   Rigoberto Noel. MD

## 2014-08-02 NOTE — Progress Notes (Signed)
ANTICOAGULATION CONSULT NOTE - Follow Up Consult  Pharmacy Consult for Coumadin Indication: Hx of Aflutter  Allergies  Allergen Reactions  . Aspirin Other (See Comments)    Unknown    Patient Measurements: Height: 6\' 3"  (190.5 cm) Weight: 184 lb 8.4 oz (83.7 kg) IBW/kg (Calculated) : 84.5  Vital Signs: Temp: 97.8 F (36.6 C) (01/19 0803) Temp Source: Oral (01/19 0803) BP: 115/88 mmHg (01/19 1000) Pulse Rate: 115 (01/19 1000)  Labs:  Recent Labs  07/31/14 0310 08/01/14 0345 08/02/14 0217  HGB 11.5* 11.5* 12.1*  HCT 34.1* 34.2* 35.2*  PLT 119* 126* 144*  LABPROT 18.9* 19.7* 23.5*  INR 1.57* 1.65* 2.07*  CREATININE 0.96 1.03 0.97    Estimated Creatinine Clearance: 124.6 mL/min (by C-G formula based on Cr of 0.97).  Assessment: 71 YOM w/ PMH of HTN, EtOH abuse, HLD, transposition of the great vessels. Patient is on PTA chronic coumadin and diltiazem for atrial flutter. Patient reports he has been noncompliant with his medications. Continued coumadin for the first few days until admitted to the ICU. Then switched to treatment dose lovenox. Pt did not receive coumadin on 1/18. INR is therapeutic and has been trending up to 2.07. I would expect trend to slow down due to no dose on 1/18. Plan to restart coumadin and d/c lovenox. CBC stable. No bleeding noted.  Goal of Therapy:  INR 2-3 Monitor platelets by anticoagulation protocol: Yes   Plan:  Give coumadin 7.5mg  PO x 1  Monitor INR, CBC, s/s of bleed  Petrita Blunck J 08/02/2014,11:25 AM

## 2014-08-02 NOTE — Progress Notes (Signed)
FMTS Social Note  Matthew [name] is a 39 y.o. male here for narrow complex tachycardia and alcohol withdrawal.  He was transferred to ICU 1/18 AM for complex alcohol withdrawal.  Still requiring precedex drip.  We will continue to follow along peripherally, and appreciate the great care from [facility] team.  Plan to resume as primary team when patient stable and out of ICU.  Lavon Paganini, MD, MPH PGY-1,  [facility] 08/02/2014 7:15 AM

## 2014-08-02 NOTE — Clinical Social Work Note (Signed)
Clinical Social Worker has assessed pt and pt's family. Full psychosocial to follow.   Glendon Axe, MSW, LCSWA [phone] 08/02/2014 2:52 PM

## 2014-08-02 NOTE — Clinical Social Work Psychosocial (Signed)
Clinical Social Work Department BRIEF PSYCHOSOCIAL ASSESSMENT 08/02/2014  Patient:  Jansson,Jullian C     Account Number:  [id number]     Admit date:  07/28/2014  Clinical Social Worker:  ,, CLINICAL SOCIAL WORKER  Date/Time:  08/02/2014 03:31 PM  Referred by:  Physician  Date Referred:  08/02/2014 Referred for  Substance Abuse   Other Referral:   Interview type:  Other - See comment Other interview type:   CSW met with patient at bedside and spoke to pt's mother, Chris via telephone.    PSYCHOSOCIAL DATA Living Status:  FAMILY Admitted from facility:  n/a Level of care:  n/a Primary support name:  Chris McKinney Primary support relationship to patient:  PARENT Degree of support available:   Strong    CURRENT CONCERNS Current Concerns  Substance Abuse   Other Concerns:    SOCIAL WORK ASSESSMENT / PLAN Clinical Social Worker met with pt at bedside in reference to concerns of current subtance abuse. CSW introduced CSW role. Pt stated his last drink was last Tuesday however before he would have about 6 drinks per day. Pt stated what he had to drink depended on what his day consisted of. CSW asked pt what initated change and pt replied "I want to get clean for my family. I want to support and just take care of my family". Pt further reported he has 3 children and he wants to be there for them. CSW reviewed several treatment resources and left resource packet at bedside. Pt drifted to sleep several times during interview, stating he was tired. CSW contacted pt's mother as requested. Pt's mother asked several questions in regards to substance abuse treatment and treatment cost. CSW advised pt's mother to review resource list with pt at bedside and contact desired facilities for additional information. Pt's mother futher reported this is the first time pt has made an attempt towards treatment and that she hopes he is serious. Pt's mother further stated pt is going through a  divorce. CSW reviewed substance abuse treatment list with pt's mother and encouraged her to contact this CSW with further questions/concerns. No further intervention required CSW signing off. Please consult this CSW again if new needs arises.   Assessment/plan status:  No Further Intervention Required Other assessment/ plan:  SBIRT  Information/referral to community resources:   Substance Abuse/ Counseling resources provided.    PATIENT'S/FAMILY'S RESPONSE TO PLAN OF CARE: Pt lying in bed and reported he is tired. Pt participated in assessment however drifted off to sleep throughout interview. Pt stated he is committment to change for his family and plans to take advantage of substance abuse resources. Pt's mother pleaseant, very supportive and appreciated social work intervention.     , MSW, LCSWA [phone] 08/02/2014 3:59 PM  

## 2014-08-03 LAB — PROTIME-INR
INR: 1.97 — ABNORMAL HIGH (ref 0.00–1.49)
Prothrombin Time: 22.6 seconds — ABNORMAL HIGH (ref 11.6–15.2)

## 2014-08-03 LAB — CBC
HCT: 37.1 % — ABNORMAL LOW (ref 39.0–52.0)
Hemoglobin: 12.7 g/dL — ABNORMAL LOW (ref 13.0–17.0)
MCH: 35.6 pg — ABNORMAL HIGH (ref 26.0–34.0)
MCHC: 34.2 g/dL (ref 30.0–36.0)
MCV: 103.9 fL — ABNORMAL HIGH (ref 78.0–100.0)
Platelets: 181 10*3/uL (ref 150–400)
RBC: 3.57 MIL/uL — ABNORMAL LOW (ref 4.22–5.81)
RDW: 15.5 % (ref 11.5–15.5)
WBC: 4.8 10*3/uL (ref 4.0–10.5)

## 2014-08-03 LAB — COMPREHENSIVE METABOLIC PANEL
ALT: 67 U/L — ABNORMAL HIGH (ref 0–53)
AST: 54 U/L — ABNORMAL HIGH (ref 0–37)
Albumin: 2.6 g/dL — ABNORMAL LOW (ref 3.5–5.2)
Alkaline Phosphatase: 69 U/L (ref 39–117)
Anion gap: 5 (ref 5–15)
BUN: <5 mg/dL — ABNORMAL LOW (ref 6–23)
CO2: 32 mmol/L (ref 19–32)
Calcium: 8.4 mg/dL (ref 8.4–10.5)
Chloride: 103 mEq/L (ref 96–112)
Creatinine, Ser: 0.78 mg/dL (ref 0.50–1.35)
GFR calc Af Amer: >90 mL/min (ref >90)
GFR calc non Af Amer: >90 mL/min (ref >90)
Glucose, Bld: 93 mg/dL (ref 70–99)
Potassium: 3.8 mmol/L (ref 3.5–5.1)
Sodium: 140 mmol/L (ref 135–145)
Total Bilirubin: 1.2 mg/dL (ref 0.3–1.2)
Total Protein: 6 g/dL (ref 6.0–8.3)

## 2014-08-03 LAB — MAGNESIUM: Magnesium: 1.5 mg/dL (ref 1.5–2.5)

## 2014-08-03 MED ORDER — POTASSIUM CHLORIDE CRYS ER 20 MEQ PO TBCR
40.0000 meq | EXTENDED_RELEASE_TABLET | Freq: Once | ORAL | Status: AC
  Administered 2014-08-03: 40 meq via ORAL
  Filled 2014-08-03: qty 2

## 2014-08-03 MED ORDER — OXYCODONE HCL 5 MG PO TABS
5.0000 mg | ORAL_TABLET | Freq: Four times a day (QID) | ORAL | Status: DC | PRN
  Administered 2014-08-03: 5 mg via ORAL
  Filled 2014-08-03: qty 1

## 2014-08-03 MED ORDER — MAGNESIUM SULFATE 2 GM/50ML IV SOLN
2.0000 g | Freq: Once | INTRAVENOUS | Status: AC
  Administered 2014-08-03: 2 g via INTRAVENOUS
  Filled 2014-08-03: qty 50

## 2014-08-03 MED ORDER — WARFARIN SODIUM 7.5 MG PO TABS
7.5000 mg | ORAL_TABLET | Freq: Once | ORAL | Status: AC
  Administered 2014-08-03: 7.5 mg via ORAL
  Filled 2014-08-03: qty 1

## 2014-08-03 NOTE — Consult Note (Signed)
Reason for Consult: A-Flutter Referring Physician:   BHAVYA GRAND is an 39 y.o. male.  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  He is on coumadin.  His last echo was 12/2013, EF 45-50% Mod RAE, RVE, mod regurge through the systemic AV.   He was seen in June 2015 for similar issues and was seen by EP who recommended ablation of the intraatrial reentrant tachycardia .  At that time he was discharged on diltiazem 160m Q12 and metoprolol 50 bid with thoughts that he may need short term amio at some point.     He presented on 07/28/14 with a fast HR, Hypokalemia, hypomagnesemia, hypocalcemia, diarrhea, transaminitis, fever.   He had missed some doses of diltiazem prior to admission.  He was started on IV diltiazem and transitioned to po 60 Q8 with metoprolol 570mBID.  The patient reports, prior to admission, his HR is always between 110-120.  In fact, the last 36 hrs have been very steady around 115-120.  He denies , SOB, dizziness, CP, LEE, orthopnea.    Past Medical History  Diagnosis Date  . Complete transposition of great vessels   . Hypertension   . Dyslipidemia   . Depression   . Alcohol abuse   [facility]    Past Surgical History  Procedure Laterality Date  . Volar plate arthroplasty, right small finger proxima linterphalangeal joint    . Septostomy      rashkind  . Mustard procedure      Family History  Problem Relation Age of Onset  . Rheumatic fever Father   . Hypertension Father     Social History:  reports that he has never smoked. He does not have any smokeless tobacco history on file. He reports that he drinks alcohol. He reports that he does not use illicit drugs.  Allergies:  Allergies  Allergen Reactions  . Aspirin Other (See Comments)    Unknown    Medications:  Scheduled Meds: . busPIRone  15 mg  Oral BID  . diltiazem  60 mg Oral 3 times per day  . FLUoxetine  20 mg Oral Daily  . folic acid  1 mg Oral Daily  . LORazepam  2 mg Intravenous Q6H  . metoprolol tartrate  50 mg Oral BID  . multivitamin with minerals  1 tablet Oral Daily  . pantoprazole  40 mg Oral Daily  . QUEtiapine  50 mg Oral QHS  . sodium chloride  3 mL Intravenous Q12H  . thiamine  100 mg Oral Daily  . warfarin  7.5 mg Oral ONCE-1800  . Warfarin - Pharmacist Dosing Inpatient   Does not apply q1800   Continuous Infusions:  PRN Meds:.LORazepam, ondansetron **OR** ondansetron (ZOFRAN) IV, phenol   Results for orders placed or performed during the hospital encounter of 07/28/14 (from the past 48 hour(s))  Glucose, capillary     Status: None   Collection Time: 08/01/14  4:02 PM  Result Value Ref Range   Glucose-Capillary 87 70 - 99 mg/dL  Glucose, capillary     Status: None   Collection Time: 08/01/14  7:18 PM  Result Value Ref Range   Glucose-Capillary 88 70 - 99 mg/dL  Protime-INR     Status: Abnormal   Collection Time: 08/02/14  2:17 AM  Result Value Ref Range   Prothrombin Time 23.5 (H)  11.6 - 15.2 seconds   INR 2.07 (H) 0.00 - 1.49  CBC     Status: Abnormal   Collection Time: 08/02/14  2:17 AM  Result Value Ref Range   WBC 6.6 4.0 - 10.5 K/uL   RBC 3.37 (L) 4.22 - 5.81 MIL/uL   Hemoglobin 12.1 (L) 13.0 - 17.0 g/dL   HCT 35.2 (L) 39.0 - 52.0 %   MCV 104.5 (H) 78.0 - 100.0 fL   MCH 35.9 (H) 26.0 - 34.0 pg   MCHC 34.4 30.0 - 36.0 g/dL   RDW 15.7 (H) 11.5 - 15.5 %   Platelets 144 (L) 150 - 400 K/uL  Comprehensive metabolic panel     Status: Abnormal   Collection Time: 08/02/14  2:17 AM  Result Value Ref Range   Sodium 140 135 - 145 mmol/L    Comment: Please note change in reference range.   Potassium 3.4 (L) 3.5 - 5.1 mmol/L    Comment: Please note change in reference range.   Chloride 103 96 - 112 mEq/L   CO2 25 19 - 32 mmol/L   Glucose, Bld 87 70 - 99 mg/dL   BUN 6 6 - 23 mg/dL   Creatinine,  Ser 0.97 0.50 - 1.35 mg/dL   Calcium 7.8 (L) 8.4 - 10.5 mg/dL   Total Protein 5.7 (L) 6.0 - 8.3 g/dL   Albumin 2.7 (L) 3.5 - 5.2 g/dL   AST 85 (H) 0 - 37 U/L   ALT 87 (H) 0 - 53 U/L   Alkaline Phosphatase 78 39 - 117 U/L   Total Bilirubin 1.7 (H) 0.3 - 1.2 mg/dL   GFR calc non Af Amer >90 >90 mL/min   GFR calc Af Amer >90 >90 mL/min    Comment: (NOTE) The eGFR has been calculated using the CKD EPI equation. This calculation has not been validated in all clinical situations. eGFR's persistently <90 mL/min signify possible Chronic Kidney Disease.    Anion gap 12 5 - 15  Magnesium     Status: None   Collection Time: 08/02/14  2:17 AM  Result Value Ref Range   Magnesium 1.6 1.5 - 2.5 mg/dL  Glucose, capillary     Status: None   Collection Time: 08/02/14  3:58 PM  Result Value Ref Range   Glucose-Capillary 75 70 - 99 mg/dL  Protime-INR     Status: Abnormal   Collection Time: 08/03/14  2:37 AM  Result Value Ref Range   Prothrombin Time 22.6 (H) 11.6 - 15.2 seconds   INR 1.97 (H) 0.00 - 1.49  CBC     Status: Abnormal   Collection Time: 08/03/14  2:37 AM  Result Value Ref Range   WBC 4.8 4.0 - 10.5 K/uL   RBC 3.57 (L) 4.22 - 5.81 MIL/uL   Hemoglobin 12.7 (L) 13.0 - 17.0 g/dL   HCT 37.1 (L) 39.0 - 52.0 %   MCV 103.9 (H) 78.0 - 100.0 fL   MCH 35.6 (H) 26.0 - 34.0 pg   MCHC 34.2 30.0 - 36.0 g/dL   RDW 15.5 11.5 - 15.5 %   Platelets 181 150 - 400 K/uL  Comprehensive metabolic panel     Status: Abnormal   Collection Time: 08/03/14  2:37 AM  Result Value Ref Range   Sodium 140 135 - 145 mmol/L    Comment: Please note change in reference range.   Potassium 3.8 3.5 - 5.1 mmol/L    Comment: Please note change in reference range.   Chloride  103 96 - 112 mEq/L   CO2 32 19 - 32 mmol/L   Glucose, Bld 93 70 - 99 mg/dL   BUN <5 (L) 6 - 23 mg/dL    Comment: REPEATED TO VERIFY   Creatinine, Ser 0.78 0.50 - 1.35 mg/dL   Calcium 8.4 8.4 - 10.5 mg/dL   Total Protein 6.0 6.0 - 8.3 g/dL    Albumin 2.6 (L) 3.5 - 5.2 g/dL   AST 54 (H) 0 - 37 U/L   ALT 67 (H) 0 - 53 U/L   Alkaline Phosphatase 69 39 - 117 U/L   Total Bilirubin 1.2 0.3 - 1.2 mg/dL   GFR calc non Af Amer >90 >90 mL/min   GFR calc Af Amer >90 >90 mL/min    Comment: (NOTE) The eGFR has been calculated using the CKD EPI equation. This calculation has not been validated in all clinical situations. eGFR's persistently <90 mL/min signify possible Chronic Kidney Disease.    Anion gap 5 5 - 15  Magnesium     Status: None   Collection Time: 08/03/14  2:37 AM  Result Value Ref Range   Magnesium 1.5 1.5 - 2.5 mg/dL    Dg Chest Port 1 View  08/02/2014   CLINICAL DATA:  Hypoxia.  Shortness of breath.  EXAM: PORTABLE CHEST - 1 VIEW  COMPARISON:  07/31/2014 .  FINDINGS: Mediastinum and hilar structures are normal. Prior CABG. Cardiomegaly with bilateral mild interstitial prominence. Congestive heart failure could present this fashion. Left lower lobe atelectasis and or infiltrate. Tiny left pleural effusion cannot be excluded. No pneumothorax.  IMPRESSION: 1. Cardiomegaly with mild interstitial prominence. Congestive heart failure cannot be excluded. Small left pleural effusion. 2. Left lower lobe atelectasis and/or infiltrate.   Electronically Signed   By: Marcello Moores  Register   On: 08/02/2014 07:07    Review of Systems  Constitutional: Negative for fever and diaphoresis.  HENT: Negative for congestion and sore throat.   Respiratory: Negative for cough and shortness of breath.   Cardiovascular: Positive for palpitations. Negative for chest pain, orthopnea, leg swelling and PND.  Gastrointestinal: Negative for nausea, vomiting and abdominal pain.  Musculoskeletal: Negative for myalgias.  Neurological: Negative for dizziness.  All other systems reviewed and are negative.  Blood pressure 89/60, pulse 121, temperature 98.2 F (36.8 C), temperature source Oral, resp. rate 18, height 6' 3"  (1.905 m), weight 189 lb 9.5 oz (86 kg),  SpO2 98 %. Physical Exam  Nursing note and vitals reviewed. Constitutional: He is oriented to person, place, and time. He appears well-developed and well-nourished. No distress.  HENT:  Head: Normocephalic and atraumatic.  Eyes: EOM are normal. Pupils are equal, round, and reactive to light.  Neck: Normal range of motion. Neck supple.  Cardiovascular: Regular rhythm, S1 normal and S2 normal.  Tachycardia present.   No murmur heard. Pulses:      Radial pulses are 2+ on the right side, and 2+ on the left side.       Dorsalis pedis pulses are 1+ on the right side, and 1+ on the left side.  Respiratory: Effort normal and breath sounds normal. He has no wheezes. He has no rales.  GI: Soft. Bowel sounds are normal. He exhibits no distension. There is no tenderness.  Musculoskeletal: He exhibits no edema.  Lymphadenopathy:    He has no cervical adenopathy.  Neurological: He is alert and oriented to person, place, and time. He exhibits normal muscle tone.  Skin: Skin is warm and dry.  Psychiatric: He has a normal mood and affect.    Assessment/Plan: Active Problems:   Complete transposition of great vessels   Alcohol withdrawal   Tachycardia   Chronic systolic congestive heart failure   A-Flutter   Hypokalemia,    Hypomagnesemia as low as 0.7   Hypocalcemia   Diarrhea   Transaminitis   Fever-resolved  Plan:  Potassium and mag are now WNL.  LFTs improving.  Rate is elevated but very steady the last 36 hrs.  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  We will arrange follow up with Dr. Harrington Challenger.  He needs to follow up with EP at [facility], however, he lost his insurance.  Continue dilt and lopressor.  Monitor BP which is a little low at last check.  INR 1.97.  Needs better nutrition.   Tarri Fuller, [place] 08/03/2014, 1:59 PM     Personally seen and examined. Agree with above. Saw him last week in hospital No changes on exam Incessant Aflutter 2:1 conduction.  Complex  congenital heart disease Continue diltiazem and metoprolol and uptitrate back to home dose when comfortable from BP perspective.  Etoh cessation.   Please call if any questions.  Will have follow up with Dr. Harrington Challenger.  Candee Furbish, MD

## 2014-08-03 NOTE — Progress Notes (Signed)
Family Medicine Teaching Service Daily Progress Note Intern Pager: [phone]  Patient name: Matthew [name] Medical record number: [id number] Date of birth: Dec 05, 1975 Age: 39 y.o. Gender: male  Primary Care Provider: Glo Herring., MD Consultants: Cardiology Code Status: full  Pt Overview and Major Events to Date:  1/14: Admit for narrow complex tachycardia 1/18 AM: Transfer to ICU for CIWA score of 27 - needed precedex drip 1/19: Off of precedex 1/20: Transfer out of ICU and back to FPTS  Assessment and Plan:  Matthew [name] is a 39 y.o. male presenting with Sinus tachycardia s/p SVT en route. PMH is significant for alcohol abuse, chronic systolic CHF,   Narrow complex tachycardia: Reported to have been in SVT per EMS with HR 237. S/p Adenosine 6mg  x2 and Diltiazem 20mg  x1. Admits to missing a few days of home diltiazem. Denies CP/SOB, but some ischemic changes on EKG. Troponin neg x3. CXR clear. Likely related to missing medications and alcohol intake. - HR 103-124 O/N - s/p Diltiazem drip - now on Metoprolol 50mg  BID and Diltiazem 60mg  TID (home dose 180mg  SR BID) - BP will not tolerate increase in rate controlling agents currently - Monitor on telemetry - Repeat EKG unchanged - Cardiology consulted and signed off 1/16  Alcohol abuse, withdrawal, DTs: h/o hallucinations with withdrawal in the past. Last drink around 4am 1/14 per patient report. Patient endorses a recent 2-3 day alcohol binge with no PO intake.Ethanol level 23 on admission. S/p precedex drip in ICU 1/18-1/19.  - currently on Ativan 2mg  - watch CIWA closely and add additional scheduled or prn if necessary - continue home thiamine and MVI - SW consult for substance abuse - UDS pos for benzos  Transaminitis 2/2 acute alcoholic hepatitis: LFTs improving. AST 54 and ALT 67 today. - Continue to monitor LFTs   Fever: Febrile to 101.3 on 1/17. Resolved. WBC 4.8 - BCx (1/17) pending - Monitor  clinically, no indication for antibiotics currently  Electrolyte abnormalities: Hypokalemia, Hypocalcemia, Hypomagnesemia: Likely related to diarrhea and decreased PO intake in setting of alcohol abuse. K 3.0 on admission >> 3.8. Ca 6.2 on admission >> 8.4 (9.5 corrected for albumin). Magnesium 0.6 on admission >> 1.5. - s/p multiple repletion doses for Mg, K, Ca.  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 No fevers. - c diff pcr negative  Macrocytosis, Thrombocytopenia: MCV 102.6, no anemia. Plts improving - 181 this AM. Likely related to alcoholism. 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 Followed by cardiology as OP. No signs of volume overload on exam. - Holding home Lasix in setting of dehydration; appears euvolumic today (although note that lasix no longer listed on home meds?) - daily weights, strict Is&Os - Monitor fluid status carefully - Not currently on statin, likely related to elevated transaminases, consider as outpatient if transaminitis improves  HTN: BPs soft O/N - sBPs 80s-130s. - Holding home losartan since BP soft - Monitor BP closely  A flutter, on chronic anticoagulation: Missed several weeks of coumadin. INR 1.04 on admit -  Coumadin per pharm, INR 1.97 today - Consider NOAC but defer this decision to outpatient setting  Psych: Depression and anxiety - continue home Prozac, buspar, seroquel  FEN/GI: Heart healthy diet, SLIV Prophylaxis: coumadin per pharm  Disposition: Pending  improvement in arrhythmia, withdrawal, and electrolytes. Transfer from ICU to SDU today.  Subjective:  Pt feeling well today, states he's eating well. No CP, feeling less tremulous.  Objective: Temp:  [97.6 F (36.4 C)-98.5 F (36.9 C)] 97.6 F (36.4 C) (01/20 0700) Pulse Rate:  [103-124] 118 (01/20 0700) Resp:  [11-37] 15 (01/20 0700) BP: (80-120)/(58-92) 99/86 mmHg (01/20 0700) SpO2:  [92 %-99 %] 96 % (01/20 0700) Weight:  [189 lb 9.5 oz (86 kg)] 189 lb 9.5 oz (86 kg) (01/20 0500) Physical Exam: General: NAD, well developed, lying in bed.  Cardiovascular: Tachycardic, regular rhythm, 2/6 systolic murmur at LUSB, no g/r. Respiratory: Normal WOB, CTAB, no w/r/c. 2L [place] in place Abdomen: Soft, NTND, no masses, rebound, guarding.  Extremities: WWP, no edema Neuro:  no tremors noted, speech normal  Laboratory:  Recent Labs Lab 08/01/14 0345 08/02/14 0217 08/03/14 0237  WBC 7.3 6.6 4.8  HGB 11.5* 12.1* 12.7*  HCT 34.2* 35.2* 37.1*  PLT 126* 144* 181    Recent Labs Lab 08/01/14 0345 08/02/14 0217 08/03/14 0237  NA 138 140 140  K 3.6 3.4* 3.8  CL 102 103 103  CO2 24 25 32  BUN 9 6 <5*  CREATININE 1.03 0.97 0.78  CALCIUM 7.7* 7.8* 8.4  PROT 6.0 5.7* 6.0  BILITOT 1.3* 1.7* 1.2  ALKPHOS 70 78 69  ALT 104* 87* 67*  AST 132* 85* 54*  GLUCOSE 88 87 93    Magnesium: 0.6>>1.3  Troponin 0.00  INR 1.97  Imaging/Diagnostic Tests:  CXR (1/14): No active disease.  EKG (1/14): Sinus tachycardia, left axis deviation, RVH with repol abnormality, T wave inversion and ST depression in lateral leads  Lavon Paganini, MD 08/03/2014, 9:22 AM PGY-1, [place] Intern pager: [phone], text pages welcome

## 2014-08-03 NOTE — Progress Notes (Signed)
Pt transferred to 2W10 via wheelchair on telemetry monitoring. Pt's VSS and pt tolerated tx well. Pt assisted to chair and received by RN.

## 2014-08-03 NOTE — Progress Notes (Signed)
ANTICOAGULATION CONSULT NOTE - Follow Up Consult  Pharmacy Consult for Coumadin Indication: Hx of Aflutter  Allergies  Allergen Reactions  . Aspirin Other (See Comments)    Unknown    Patient Measurements: Height: 6\' 3"  (190.5 cm) Weight: 189 lb 9.5 oz (86 kg) IBW/kg (Calculated) : 84.5  Vital Signs: Temp: 97.6 F (36.4 C) (01/20 0700) Temp Source: Oral (01/20 0700) BP: 98/65 mmHg (01/20 1000) Pulse Rate: 115 (01/20 1000)  Labs:  Recent Labs  08/01/14 0345 08/02/14 0217 08/03/14 0237  HGB 11.5* 12.1* 12.7*  HCT 34.2* 35.2* 37.1*  PLT 126* 144* 181  LABPROT 19.7* 23.5* 22.6*  INR 1.65* 2.07* 1.97*  CREATININE 1.03 0.97 0.78    Estimated Creatinine Clearance: 152.6 mL/min (by C-G formula based on Cr of 0.78).   Assessment: 7 YOM w/ PMH of HTN, EtOH abuse, HLD, transposition of the great vessels. Patient is on PTA chronic coumadin and diltiazem for atrial flutter. Patient reports he has been noncompliant with his medications. Continued coumadin for the first few days until admitted to the ICU. Then switched to treatment dose lovenox. Pt did not receive coumadin on 1/18. INR is now slightly subtherapeutic at 1.97, which was expected with missed dose on 1/18. CBC stable. No bleeding noted.  Goal of Therapy:  INR 2-3 Monitor platelets by anticoagulation protocol: Yes   Plan:  Give coumadin 7.5mg  PO x 1  Monitor INR, CBC, s/s of bleed   Shrihan Putt J 08/03/2014,10:51 AM

## 2014-08-03 NOTE — Progress Notes (Signed)
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 He drinks 2 gallons of vodka or whiskey in 1 week last drink Weds prior to admission transferred to [place] with alcohol withdrawal despite a signficant amount of Ativan and ST.   STUDIES:  1/17 CXR 1. Mild bronchial wall thickening as described which could reflect acute bronchitis or be chronic. Is mildly increased from prior studies. No evidence of lobar pneumonia. Minimal left effusion. Findings could be due to mild interstitial edema.  1/19  CXR 1. Cardiomegaly with mild interstitial prominence. Congestive heart failure cannot be excluded. Small left pleural effusion. 2. Left lower lobe atelectasis and/or infiltrate.  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 He drinks 2 gallons of vodka or whiskey in 1 week last drink Weds prior to admission transferred to [place] with alcohol withdrawal despite a signficant amount of Ativan and ST.   PAST MEDICAL HISTORY :   has a past medical history of Complete transposition of great vessels; Hypertension; Dyslipidemia; Depression; Alcohol abuse; and [facility] spotted fever (11/2013).  has past surgical history that includes volar plate arthroplasty, right small finger proxima linterphalangeal joint; Septostomy; and mustard procedure. Prior to Admission medications   Medication Sig Start Date End Date Taking?  Authorizing Provider  acetaminophen (TYLENOL) 500 MG tablet Take 1,000 mg by mouth every 6 (six) hours as needed for headache.   Yes Historical Provider, MD  ALPRAZolam Duanne Moron) 0.5 MG tablet Take 1 tablet (0.5 mg total) by mouth 3 (three) times daily as needed for anxiety. Patient taking differently: Take 0.5 mg by mouth 2 (two) times daily as needed for anxiety.  01/05/14  Yes Geradine Girt, DO  busPIRone (BUSPAR) 15 MG tablet Take 15 mg by mouth 2 (two) times daily.   Yes Historical Provider, MD  diltiazem (CARDIZEM SR) 90 MG 12 hr capsule TAKE TWO CAPSULES BY MOUTH EVERY 12 HOURS **NEEDS  APPOINTMENT** Patient taking differently: Take 180 mg by mouth every 12 hours 07/26/14  Yes Fay Records, MD  FLUoxetine (PROZAC) 20 MG capsule Take 20 mg by mouth daily.   Yes Historical Provider, MD  folic acid (FOLVITE) 1 MG tablet Take 1 tablet (1 mg total) by mouth daily. 01/05/14  Yes Geradine Girt, DO  losartan (COZAAR) 50 MG tablet Take 1 tablet (50 mg total) by mouth daily. 04/23/14  Yes Erline Hau, MD  metoprolol (LOPRESSOR) 100 MG tablet Take 1 tablet (100 mg total) by mouth 2 (two) times daily. 04/23/14  Yes Erline Hau, MD  Multiple Vitamin (MULTIVITAMIN WITH MINERALS) TABS tablet Take 1 tablet by mouth daily. 04/23/14  Yes Erline Hau, MD  pantoprazole (PROTONIX) 40 MG tablet Take 40 mg by mouth daily.   Yes Historical Provider, MD  potassium chloride SA (KLOR-CON M20) 20 MEQ tablet Take 1 tablet (20 mEq total) by mouth daily. 01/17/14  Yes Imogene Burn, PA-C  QUEtiapine (SEROQUEL XR) 50 MG TB24 24 hr tablet  Take 1 tablet (50 mg total) by mouth at bedtime. 01/05/14  Yes Geradine Girt, DO  thiamine 100 MG tablet Take 1 tablet (100 mg total) by mouth daily. 04/23/14  Yes Erline Hau, MD  warfarin (COUMADIN) 7.5 MG tablet Take 1 tablet (7.5 mg total) by mouth daily at 6 PM. 04/23/14  Yes Estela Leonie Green, MD  feeding supplement, ENSURE  COMPLETE, (ENSURE COMPLETE) LIQD Take 237 mLs by mouth 2 (two) times daily between meals. Patient not taking: Reported on 07/28/2014 01/05/14   Geradine Girt, DO  furosemide (LASIX) 20 MG tablet Take 1 tablet (20 mg total) by mouth daily. Patient not taking: Reported on 07/28/2014 01/17/14   Imogene Burn, PA-C   Allergies  Allergen Reactions  . Aspirin Other (See Comments)    Unknown    FAMILY HISTORY:  indicated that his mother is alive. He indicated that his father is alive.  SOCIAL HISTORY:  reports that he has never smoked. He does not have any smokeless tobacco history on file. He reports that he drinks alcohol. He reports that he does not use illicit drugs.  REVIEW OF SYSTEMS:   HEENT:+sore throat improved  Abd: denies ab pain  Ext: denies leg edema  Neuro: denies hallucinations   SUBJECTIVE:  Sore throat improved wants to know when going home.    RN diarrhea resolved, nausea 5 am when asked said hungry. Did have AF 3:1 been in [place] this am in the 1 teens, mental clarity. Good night only given 1 of prn Ativan, CIWA 7  VITAL SIGNS: Temp:  [97.8 F (36.6 C)-98.5 F (36.9 C)] 98.1 F (36.7 C) (01/20 0424) Pulse Rate:  [103-124] 117 (01/20 0640) Resp:  [11-37] 30 (01/20 0640) BP: (80-131)/(58-99) 94/76 mmHg (01/20 0640) SpO2:  [92 %-99 %] 96 % (01/20 0640) FiO2 (%):  [40 %] 40 % (01/19 0800) Weight:  [189 lb 9.5 oz (86 kg)] 189 lb 9.5 oz (86 kg) (01/20 0500) HEMODYNAMICS:   VENTILATOR SETTINGS: Vent Mode:  [-]  FiO2 (%):  [40 %] 40 % INTAKE / OUTPUT:  Intake/Output Summary (Last 24 hours) at 08/03/14 0658 Last data filed at 08/03/14 0433  Gross per 24 hour  Intake 2132.75 ml  Output   1970 ml  Net 162.75 ml    PHYSICAL EXAMINATION: General:  Lying in bed, nad  Neuro:  Awake, interactive, eyes open, follows commands  HEENT:  [place]/at Cardiovascular:  ST, no murmurs Lungs:  ctab Abdomen:  bs +, ntnd, soft  Musculoskeletal:  Intact  Skin:  Intact    LABS:  CBC  Recent Labs Lab 08/01/14 0345 08/02/14 0217 08/03/14 0237  WBC 7.3 6.6 4.8  HGB 11.5* 12.1* 12.7*  HCT 34.2* 35.2* 37.1*  PLT 126* 144* 181   Coag's  Recent Labs Lab 08/01/14 0345 08/02/14 0217 08/03/14 0237  INR 1.65* 2.07* 1.97*   BMET  Recent Labs Lab 08/01/14 0345 08/02/14 0217 08/03/14 0237  NA 138 140 140  K 3.6 3.4* 3.8  CL 102 103 103  CO2 24 25 32  BUN 9 6 <5*  CREATININE 1.03 0.97 0.78  GLUCOSE 88 87 93   Electrolytes  Recent Labs Lab 07/30/14 1155  07/31/14 1000 08/01/14 0345 08/02/14 0217 08/03/14 0237  CALCIUM 7.4*  < >  --  7.7* 7.8* 8.4  MG 1.3*  < >  --  1.3* 1.6 1.5  PHOS 2.6  --  3.3 2.3  --   --   < > =  values in this interval not displayed. Sepsis Markers No results for input(s): LATICACIDVEN, PROCALCITON, O2SATVEN in the last 168 hours. ABG No results for input(s): PHART, PCO2ART, PO2ART in the last 168 hours. Liver Enzymes  Recent Labs Lab 08/01/14 0345 08/02/14 0217 08/03/14 0237  AST 132* 85* 54*  ALT 104* 87* 67*  ALKPHOS 70 78 69  BILITOT 1.3* 1.7* 1.2  ALBUMIN 2.9* 2.7* 2.6*   Cardiac Enzymes  Recent Labs Lab 07/28/14 2307 07/29/14 0407  TROPONINI <0.03 <0.03   Glucose  Recent Labs Lab 08/01/14 0707 08/01/14 1602 08/01/14 1918 08/02/14 1558  GLUCAP 85 87 88 75    Imaging Dg Chest Port 1 View  08/02/2014   CLINICAL DATA:  Hypoxia.  Shortness of breath.  EXAM: PORTABLE CHEST - 1 VIEW  COMPARISON:  07/31/2014 .  FINDINGS: Mediastinum and hilar structures are normal. Prior CABG. Cardiomegaly with bilateral mild interstitial prominence. Congestive heart failure could present this fashion. Left lower lobe atelectasis and or infiltrate. Tiny left pleural effusion cannot be excluded. No pneumothorax.  IMPRESSION: 1. Cardiomegaly with mild interstitial prominence. Congestive heart failure cannot be excluded. Small left pleural effusion. 2. Left lower lobe atelectasis and/or infiltrate.    Electronically Signed   By: Marcello Moores  Register   On: 08/02/2014 07:07     ASSESSMENT / PLAN: PULMONARY OETT none A: No clinical signs of pna Severe pulmonary HTN with hypoxia (h/o PAP 111) likely idopathic 2/2 congential heart disease  [place] ?OSA P:   Prn O2 sats >90 on [place] doing well, cpap qhs  Will need to f/u with cardiology oupt, will need outpt sleep study outpt  Until more compliant with meds and avoiding alcohol with avoid meds for pulm HTN currently (endothelin antagonists due to elevated lfts).  [facility] today tele bed   CARDIOVASCULAR CVL none A:  Now in atrial flutter  H/o SVT 1/14 H/o transposition s/p surgery  H/o Atrial flutter previously on Coumadin INR subtherapeutic and noncompliant with meds  Chronic systolic HF (EF 69-67% 02/9380) HTN hx with soft bps P:  Dilt 60 tid and Lopressor 50 mg bid (1/2 home dose) due to soft bp Coumadin, will need to f/u with Dr. Harrington Challenger cardiology outpatient and maybe good idea to call cardiology cards inpatient since pt misses outpt clinic visits  Monitor INR   RENAL A:   K 3.8 Mag 1.5 P:   Monitor electrolytes and replete prn K 40 x 1, Mag 2 gx 1   GASTROINTESTINAL A:   GI px  Transaminitis acute alcoholic hepatitis, lfts downtrending Elevated T bili Mod Malnutrition Nutrition  P:   Prn Zofran  Protonix 40 mg qd po soft diet can advance if tolerated   HEMATOLOGIC A:   Thrombocytopenia likely 2/2 alcohol  Macrocytic anemia DVT px  P:  coumadin, monitor plts    INFECTIOUS A:   Fever resolved  P:   BCx2  1/17 NTD UC not obtained  Sputum not obtained MRSA neg  Abx: none  Monitory clinically. No indication Abx   ENDOCRINE A:   No issues  P:   none  NEUROLOGIC A:   Severe Alcohol w/d with DT H/o Alcoholism and dependence Anxiety/Depression P:   RASS goal: 0 now 0  Resume Prozac 20 mg qd, Buspar 15 mg bid, Seroquel XR 50 mg qd Folic acid, mvt, thiamine  Precedex gtt  off,  Ativan 1-4 mg q1 prn (use as 1st line), Ativan 2 mg q6  Monitor ciwa  Social work  consulted   FAMILY  - Updates: none today - Inter-disciplinary family meet or Palliative Care meeting due by:  08/08/14     TODAY'S SUMMARY: Alcohol withdrawal weaned off Precedex, continue Ativan sch if needed and prn, alcoholic hepatitis  lfts down.  Monitor VS and tele. Consulted social work for substance. Will transfer to tele and family medicine service today   08/03/2014, 6:58 AM  Cresenciano Genre MD  ATTENDING NOTE: I have personally reviewed patient's available data, including medical history, events of note, physical examination and test results as part of my evaluation. I have discussed with resident/NP and other careteam providers such as pharmacist, RN and RRT & co-ordinated with consultants. In addition, I personally evaluated patient and elicited key history of alcohol withdrawal requiring Precedex, congenital heart disease with severe pulmonary hypertension, witnessed hypoxia during sleep, exam findings of much improved, off Precedex more than 12 hours, clear lungs & labs showing near therapeutic INR, no leukocytosis.  He has improved significantly Has some breakthrough tachyarrhythmias-in spite of resuming Lopressor and Cardizem. We'll increase Lopressor back to home dose Cardiology will be recalled- I really do not think he is a candidate for treatment of pulmonary hypertension, unless he remains sober for a period of time.  Rest per medical resident whose note is outlined above and that I agree with and edited in full.   Rigoberto Noel MD

## 2014-08-03 NOTE — Progress Notes (Addendum)
Paged [name] to inquire if want to hold metoprolol. BP 92/67 and patient states feels lightheaded when he gets up. Awaiting return page for orders.   Patient instructed to call if he needs to get up and verbalized understanding. CIWA scale level 0. Patient alert and oriented x3.  Will continue to monitor.   [name]  Dr. Brita Romp called and gave orders to go ahead and give metoprolol to help control heart rate. [name] aware of lower BP.   Will continue to monitor.   [name]

## 2014-08-04 ENCOUNTER — Other Ambulatory Visit: Payer: Self-pay

## 2014-08-04 DIAGNOSIS — F10239 Alcohol dependence with withdrawal, unspecified: Secondary | ICD-10-CM

## 2014-08-04 LAB — COMPREHENSIVE METABOLIC PANEL
ALT: 58 U/L — ABNORMAL HIGH (ref 0–53)
AST: 56 U/L — ABNORMAL HIGH (ref 0–37)
Albumin: 2.5 g/dL — ABNORMAL LOW (ref 3.5–5.2)
Alkaline Phosphatase: 63 U/L (ref 39–117)
Anion gap: 8 (ref 5–15)
BUN: <5 mg/dL — ABNORMAL LOW (ref 6–23)
CO2: 26 mmol/L (ref 19–32)
Calcium: 8.5 mg/dL (ref 8.4–10.5)
Chloride: 106 mEq/L (ref 96–112)
Creatinine, Ser: 0.68 mg/dL (ref 0.50–1.35)
GFR calc Af Amer: >90 mL/min (ref >90)
GFR calc non Af Amer: >90 mL/min (ref >90)
Glucose, Bld: 92 mg/dL (ref 70–99)
Potassium: 3.7 mmol/L (ref 3.5–5.1)
Sodium: 140 mmol/L (ref 135–145)
Total Bilirubin: 0.6 mg/dL (ref 0.3–1.2)
Total Protein: 5.4 g/dL — ABNORMAL LOW (ref 6.0–8.3)

## 2014-08-04 LAB — PROTIME-INR
INR: 2.11 — ABNORMAL HIGH (ref 0.00–1.49)
Prothrombin Time: 23.8 seconds — ABNORMAL HIGH (ref 11.6–15.2)

## 2014-08-04 LAB — CBC
HCT: 38.7 % — ABNORMAL LOW (ref 39.0–52.0)
Hemoglobin: 13 g/dL (ref 13.0–17.0)
MCH: 36.1 pg — ABNORMAL HIGH (ref 26.0–34.0)
MCHC: 33.6 g/dL (ref 30.0–36.0)
MCV: 107.5 fL — ABNORMAL HIGH (ref 78.0–100.0)
Platelets: 207 10*3/uL (ref 150–400)
RBC: 3.6 MIL/uL — ABNORMAL LOW (ref 4.22–5.81)
RDW: 15.9 % — ABNORMAL HIGH (ref 11.5–15.5)
WBC: 3.8 10*3/uL — ABNORMAL LOW (ref 4.0–10.5)

## 2014-08-04 LAB — MAGNESIUM: Magnesium: 1.4 mg/dL — ABNORMAL LOW (ref 1.5–2.5)

## 2014-08-04 MED ORDER — DILTIAZEM HCL 60 MG PO TABS
60.0000 mg | ORAL_TABLET | Freq: Four times a day (QID) | ORAL | Status: DC
  Administered 2014-08-04 – 2014-08-05 (×5): 60 mg via ORAL
  Filled 2014-08-04 (×8): qty 1

## 2014-08-04 MED ORDER — LORAZEPAM 2 MG/ML IJ SOLN
1.0000 mg | Freq: Four times a day (QID) | INTRAMUSCULAR | Status: DC
  Administered 2014-08-04: 1 mg via INTRAVENOUS
  Filled 2014-08-04: qty 1

## 2014-08-04 MED ORDER — MAGNESIUM OXIDE 400 (241.3 MG) MG PO TABS
400.0000 mg | ORAL_TABLET | Freq: Once | ORAL | Status: AC
  Administered 2014-08-04: 400 mg via ORAL
  Filled 2014-08-04: qty 1

## 2014-08-04 MED ORDER — WARFARIN SODIUM 7.5 MG PO TABS
7.5000 mg | ORAL_TABLET | Freq: Once | ORAL | Status: AC
  Administered 2014-08-04: 7.5 mg via ORAL
  Filled 2014-08-04: qty 1

## 2014-08-04 NOTE — Progress Notes (Signed)
Matthew [name] for Coumadin Indication: Hx of Aflutter  Allergies  Allergen Reactions  . Aspirin Other (See Comments)    Unknown    Patient Measurements: Height: 6\' 3"  (190.5 cm) Weight: 182 lb 1.6 oz (82.6 kg) IBW/kg (Calculated) : 84.5  Vital Signs: Temp: 97.8 F (36.6 C) (01/21 0400) Temp Source: Oral (01/21 0400) BP: 90/68 mmHg (01/21 0400) Pulse Rate: 117 (01/21 0400)  Labs:  Recent Labs  08/02/14 0217 08/03/14 0237 08/04/14 0445  HGB 12.1* 12.7* 13.0  HCT 35.2* 37.1* 38.7*  PLT 144* 181 207  LABPROT 23.5* 22.6* 23.8*  INR 2.07* 1.97* 2.11*  CREATININE 0.97 0.78 0.68    Estimated Creatinine Clearance: 149.1 mL/min (by C-G formula based on Cr of 0.68).   Assessment: 55 YOM w/ PMH of HTN, EtOH abuse, HLD, transposition of the great vessels. Patient is on PTA chronic coumadin and diltiazem for atrial flutter. Patient reports he has been noncompliant with his medications. Continued coumadin for the first few days until admitted to the ICU. Then switched to treatment dose lovenox. Pt did not receive coumadin on 1/18.   INR now therapeutic at 2.1. CBC stable. No bleeding noted.  Per MD notes may transition to [place] but this would be done as an outpatient.  Goal of Therapy:  INR 2-3 Monitor platelets by anticoagulation protocol: Yes   Plan:  Give coumadin 7.5mg  PO x 1  Monitor INR, CBC, s/s of bleed  Erin Hearing PharmD., BCPS Clinical Pharmacist Pager [phone] 08/04/2014 9:46 AM

## 2014-08-04 NOTE — Progress Notes (Signed)
Family Medicine Teaching Service Daily Progress Note Intern Pager: [phone]  Patient name: Matthew [name] Medical record number: [id number] Date of birth: Nov 07, 1975 Age: 39 y.o. Gender: male  Primary Care Provider: Glo Herring., MD Consultants: Cardiology, [facility] Code Status: full  Pt Overview and Major Events to Date:  1/14: Admit for narrow complex tachycardia 1/18 AM: Transfer to ICU for CIWA score of 27 - needed precedex drip 1/19: Off of precedex 1/20: Transfer out of ICU and back to FPTS  Assessment and Plan:  Matthew [name] is a 39 y.o. male presenting with Sinus tachycardia s/p SVT en route. PMH is significant for alcohol abuse, chronic systolic CHF,   Narrow complex tachycardia: Reported to have been in SVT per EMS with HR 237. S/p Adenosine 6mg  x2 and Diltiazem 20mg  x1. Admits to missing a few days of home diltiazem. Denies CP/SOB, but some ischemic changes on EKG. Troponin neg x3. CXR clear. Likely related to missing medications and alcohol intake. - HR 115-122 O/N - s/p Diltiazem drip - now on Metoprolol 50mg  BID (home dose 100mg  BID) and Diltiazem 60mg  TID (home dose 180mg  SR BID) - will increase Diltiazem to QID dosing today - BP will not tolerate increase in rate controlling agents currently - Monitor on telemetry - Repeat EKG unchanged - Cardiology consulted and signed off 1/16 - re-called by [facility] 1/20 and agree with current plan to increase Metoprolol and Diltiazem as permitted by BP  Alcohol abuse, withdrawal, DTs: h/o hallucinations with withdrawal in the past. Last drink around 4am 1/14 per patient report. Patient endorses a recent 2-3 day alcohol binge with no PO intake.Ethanol level 23 on admission. S/p precedex drip in ICU 1/18-1/19.  - currently on Ativan 2mg  q6h - can titrate down today as CIWA scores improved - watch CIWA closely - scores 0 O/N - continue home thiamine and MVI - SW consult for substance abuse - UDS pos for  benzos  Transaminitis 2/2 acute alcoholic hepatitis: LFTs improving. AST 56 and ALT 58 today. - Continue to monitor LFTs   Fever: Febrile to 101.3 on 1/17. Resolved. WBC 4.8 - BCx (1/17) pending - Monitor clinically, no indication for antibiotics currently  Electrolyte abnormalities: Hypokalemia, Hypocalcemia, Hypomagnesemia: Likely related to diarrhea and decreased PO intake in setting of alcohol abuse. K 3.0 on admission >> 3.7. Ca 6.2 on admission >> 8.5. Magnesium 0.6 on admission >> 1.4. - s/p multiple repletion doses for Mg, K, Ca.  - Replete with PO Magnesium today - Continue to monitor qAM  Severe pulmonary HTN with hypoxia likely idiopathic 2/2 congenital heart disease:  - Plan for OP f/u with cardiology - Not on any pulm HTN meds currently, in setting of transaminitis and h/o med noncompliance - Consider OP sleep study - O2 prn, CPAP qhs  Diarrhea: Resolved. Likely related to alcohol use. No fevers. - c diff pcr negative  Macrocytosis, Thrombocytopenia: MCV 102.6, no anemia. Plts improving - 181 this AM. Likely related to alcoholism. 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 Followed by cardiology as OP. No signs of volume overload on exam. - Holding home Lasix in setting of dehydration; appears euvolumic today (although note that lasix no longer listed on home meds?)  - daily weights, strict Is&Os - Monitor fluid status carefully - Not currently on statin, likely related to  elevated transaminases, consider as outpatient if transaminitis improves  HTN: BPs soft O/N - sBPs 87-100.  - Holding home losartan since BP soft - Monitor BP closely  A flutter, on chronic anticoagulation: Missed several weeks of coumadin. INR 1.04 on admit - Coumadin per pharm, INR 2.11 today -  Consider NOAC but defer this decision to outpatient setting  Psych: Depression and anxiety - continue home Prozac, buspar, seroquel  FEN/GI: Heart healthy diet, SLIV Prophylaxis: coumadin per pharm  Disposition: Pending improvement in arrhythmia, withdrawal, and electrolytes.   Subjective:  Pt feeling well today, states he's eating well. No CP, feeling less tremulous. Wants to go home and then to IP alcohol rehab.  Objective: Temp:  [97.8 F (36.6 C)-98.2 F (36.8 C)] 97.8 F (36.6 C) (01/21 0400) Pulse Rate:  [115-122] 117 (01/21 0400) Resp:  [17-35] 17 (01/21 0400) BP: (87-98)/(58-70) 90/68 mmHg (01/21 0400) SpO2:  [94 %-98 %] 94 % (01/21 0400) Weight:  [182 lb 1.6 oz (82.6 kg)] 182 lb 1.6 oz (82.6 kg) (01/21 0500) Physical Exam: General: NAD, well developed, lying in bed.  Cardiovascular: Tachycardic, regular rhythm, 2/6 systolic murmur at LUSB, no g/r. Respiratory: Normal WOB, CTAB, no w/r/c, on RA Abdomen: Soft, NTND, no masses, rebound, guarding.  Extremities: WWP, no edema Neuro:  no tremors noted, speech normal  Laboratory:  Recent Labs Lab 08/02/14 0217 08/03/14 0237 08/04/14 0445  WBC 6.6 4.8 3.8*  HGB 12.1* 12.7* 13.0  HCT 35.2* 37.1* 38.7*  PLT 144* 181 207    Recent Labs Lab 08/02/14 0217 08/03/14 0237 08/04/14 0445  NA 140 140 140  K 3.4* 3.8 3.7  CL 103 103 106  CO2 25 32 26  BUN 6 <5* <5*  CREATININE 0.97 0.78 0.68  CALCIUM 7.8* 8.4 8.5  PROT 5.7* 6.0 5.4*  BILITOT 1.7* 1.2 0.6  ALKPHOS 78 69 63  ALT 87* 67* 58*  AST 85* 54* 56*  GLUCOSE 87 93 92    Magnesium: 0.6>>1.4  Troponin 0.00  INR 2.11  Imaging/Diagnostic Tests:  CXR (1/14): No active disease.  EKG (1/14): Sinus tachycardia, left axis deviation, RVH with repol abnormality, T wave inversion and ST depression in lateral leads  Lavon Paganini, MD 08/04/2014, 8:57 AM PGY-1, [place] Intern pager: [phone], text pages welcome

## 2014-08-04 NOTE — Discharge Instructions (Addendum)
You were admitted for fast heart rate and alcohol withdrawal. You required the intensive care unit for your withdrawal. IT IS ABSOLUTELY IMPORTANT FOR YOUR HEALTH THAT YOU STOP DRINKING ALCOHOL. You were very sick on arrival to the hospital and your life was threatened from your medical conditions and alcohol abuse.  For your Atrial flutter, you will go home with: Diltiazem 120mg  taken every 12 hours Metoprolol 50mg  taken every 12 hours Coumadin: your INR was therapeutic for the past several days, continue 7.5mg  daily and follow up for repeat INR checks this week  Discuss with your cardiology appt or primary doctor if you need to continue taking furosemide (lasix). You were not given this during the hospital because of your low blood pressure.  For alcohol withdrawal: Continue taking 1mg  ativan every 8 hours today Starting tomorrow (1/23), take 1mg  every 12 hours On 1/24, continue 1mg  every 12 hours On 1/25, decrease to 0.5mg  every 12 hours On 1/26 continue 0.5mg  every 12 hours On 1/27 decrease to 0.5mg  once a day, continue this on 1/28, then use only as needed  Information on my medicine - Coumadin   (Warfarin) Why was Coumadin prescribed for you? Coumadin was prescribed for you because you have a blood clot or a medical condition that can cause an increased risk of forming blood clots. Blood clots can cause serious health problems by blocking the flow of blood to the heart, lung, or brain. Coumadin can prevent harmful blood clots from forming. As a reminder your indication for Coumadin is:   Stroke Prevention Because Of Atrial Fibrillation  What test will check on my response to Coumadin? While on Coumadin (warfarin) you will need to have an INR test regularly to ensure that your dose is keeping you in the desired range. The INR (international normalized ratio) number is calculated from the result of the laboratory test called prothrombin time (PT).  If an INR APPOINTMENT HAS NOT ALREADY  BEEN MADE FOR YOU please schedule an appointment to have this lab work done by your health care provider within 7 days. Your INR goal is usually a number between:  2 to 3 or your provider may give you a more narrow range like 2-2.5.  Ask your health care provider during an office visit what your goal INR is.  What  do you need to  know  About  COUMADIN? Take Coumadin (warfarin) exactly as prescribed by your healthcare provider about the same time each day.  DO NOT stop taking without talking to the doctor who prescribed the medication.  Stopping without other blood clot prevention medication to take the place of Coumadin may increase your risk of developing a new clot or stroke.  Get refills before you run out.  What do you do if you miss a dose? If you miss a dose, take it as soon as you remember on the same day then continue your regularly scheduled regimen the next day.  Do not take two doses of Coumadin at the same time.  Important Safety Information A possible side effect of Coumadin (Warfarin) is an increased risk of bleeding. You should call your healthcare provider right away if you experience any of the following: ? Bleeding from an injury or your nose that does not stop. ? Unusual colored urine (red or dark brown) or unusual colored stools (red or black). ? Unusual bruising for unknown reasons. ? A serious fall or if you hit your head (even if there is no bleeding).  Some foods  or medicines interact with Coumadin (warfarin) and might alter your response to warfarin. To help avoid this: ? Eat a balanced diet, maintaining a consistent amount of Vitamin K. ? Notify your provider about major diet changes you plan to make. ? Avoid alcohol or limit your intake to 1 drink for women and 2 drinks for men per day. (1 drink is 5 oz. wine, 12 oz. beer, or 1.5 oz. liquor.)  Make sure that ANY health care provider who prescribes medication for you knows that you are taking Coumadin (warfarin).   Also make sure the healthcare provider who is monitoring your Coumadin knows when you have started a new medication including herbals and non-prescription products.  Coumadin (Warfarin)  Major Drug Interactions  Increased Warfarin Effect Decreased Warfarin Effect  Alcohol (large quantities) Antibiotics (esp. Septra/Bactrim, Flagyl, Cipro) Amiodarone (Cordarone) Aspirin (ASA) Cimetidine (Tagamet) Megestrol (Megace) NSAIDs (ibuprofen, naproxen, etc.) Piroxicam (Feldene) Propafenone (Rythmol SR) Propranolol (Inderal) Isoniazid (INH) Posaconazole (Noxafil) Barbiturates (Phenobarbital) Carbamazepine (Tegretol) Chlordiazepoxide (Librium) Cholestyramine (Questran) Griseofulvin Oral Contraceptives Rifampin Sucralfate (Carafate) Vitamin K   Coumadin (Warfarin) Major Herbal Interactions  Increased Warfarin Effect Decreased Warfarin Effect  Garlic Ginseng Ginkgo biloba Coenzyme Q10 Green tea St. Johns wort    Coumadin (Warfarin) FOOD Interactions  Eat a consistent number of servings per week of foods HIGH in Vitamin K (1 serving =  cup)  Collards (cooked, or boiled & drained) Kale (cooked, or boiled & drained) Mustard greens (cooked, or boiled & drained) Parsley *serving size only =  cup Spinach (cooked, or boiled & drained) Swiss chard (cooked, or boiled & drained) Turnip greens (cooked, or boiled & drained)  Eat a consistent number of servings per week of foods MEDIUM-HIGH in Vitamin K (1 serving = 1 cup)  Asparagus (cooked, or boiled & drained) Broccoli (cooked, boiled & drained, or raw & chopped) Brussel sprouts (cooked, or boiled & drained) *serving size only =  cup Lettuce, raw (green leaf, endive, romaine) Spinach, raw Turnip greens, raw & chopped   These websites have more information on Coumadin (warfarin):  FailFactory.se; VeganReport.com.au;

## 2014-08-04 NOTE — Progress Notes (Signed)
Utilization review completed.  

## 2014-08-05 LAB — CBC
HCT: 38 % — ABNORMAL LOW (ref 39.0–52.0)
Hemoglobin: 12.6 g/dL — ABNORMAL LOW (ref 13.0–17.0)
MCH: 35.6 pg — ABNORMAL HIGH (ref 26.0–34.0)
MCHC: 33.2 g/dL (ref 30.0–36.0)
MCV: 107.3 fL — ABNORMAL HIGH (ref 78.0–100.0)
Platelets: 256 10*3/uL (ref 150–400)
RBC: 3.54 MIL/uL — ABNORMAL LOW (ref 4.22–5.81)
RDW: 15.7 % — ABNORMAL HIGH (ref 11.5–15.5)
WBC: 5 10*3/uL (ref 4.0–10.5)

## 2014-08-05 LAB — COMPREHENSIVE METABOLIC PANEL
ALT: 62 U/L — ABNORMAL HIGH (ref 0–53)
AST: 63 U/L — ABNORMAL HIGH (ref 0–37)
Albumin: 2.8 g/dL — ABNORMAL LOW (ref 3.5–5.2)
Alkaline Phosphatase: 63 U/L (ref 39–117)
Anion gap: 10 (ref 5–15)
BUN: <5 mg/dL — ABNORMAL LOW (ref 6–23)
CO2: 26 mmol/L (ref 19–32)
Calcium: 8.7 mg/dL (ref 8.4–10.5)
Chloride: 102 mEq/L (ref 96–112)
Creatinine, Ser: 0.69 mg/dL (ref 0.50–1.35)
GFR calc Af Amer: >90 mL/min (ref >90)
GFR calc non Af Amer: >90 mL/min (ref >90)
Glucose, Bld: 82 mg/dL (ref 70–99)
Potassium: 3.7 mmol/L (ref 3.5–5.1)
Sodium: 138 mmol/L (ref 135–145)
Total Bilirubin: 0.6 mg/dL (ref 0.3–1.2)
Total Protein: 5.9 g/dL — ABNORMAL LOW (ref 6.0–8.3)

## 2014-08-05 LAB — PROTIME-INR
INR: 2.17 — ABNORMAL HIGH (ref 0.00–1.49)
Prothrombin Time: 24.4 seconds — ABNORMAL HIGH (ref 11.6–15.2)

## 2014-08-05 LAB — MAGNESIUM: Magnesium: 1.3 mg/dL — ABNORMAL LOW (ref 1.5–2.5)

## 2014-08-05 MED ORDER — MAGNESIUM OXIDE 400 (241.3 MG) MG PO TABS
800.0000 mg | ORAL_TABLET | Freq: Once | ORAL | Status: AC
  Administered 2014-08-05: 800 mg via ORAL
  Filled 2014-08-05: qty 2

## 2014-08-05 MED ORDER — DILTIAZEM HCL ER 120 MG PO CP12: 120.0000 mg | ORAL_CAPSULE | Freq: Two times a day (BID) | ORAL | Status: DC

## 2014-08-05 MED ORDER — LORAZEPAM 1 MG PO TABS
1.0000 mg | ORAL_TABLET | Freq: Once | ORAL | Status: AC
  Administered 2014-08-05: 1 mg via ORAL
  Filled 2014-08-05: qty 1

## 2014-08-05 MED ORDER — WARFARIN SODIUM 7.5 MG PO TABS
7.5000 mg | ORAL_TABLET | Freq: Once | ORAL | Status: DC
  Filled 2014-08-05: qty 1

## 2014-08-05 MED ORDER — LORAZEPAM 1 MG PO TABS: ORAL_TABLET | ORAL | Status: DC

## 2014-08-05 MED ORDER — METOPROLOL TARTRATE 100 MG PO TABS: 50.0000 mg | ORAL_TABLET | Freq: Two times a day (BID) | ORAL | Status: DC

## 2014-08-05 MED ORDER — SALINE SPRAY 0.65 % NA SOLN
1.0000 | NASAL | Status: DC | PRN
  Filled 2014-08-05: qty 44

## 2014-08-05 NOTE — Progress Notes (Signed)
Discharge instructions completed with patient. Patient verbalizes understanding. Medications reviewed with patient.

## 2014-08-05 NOTE — Progress Notes (Signed)
Pt alert and oriented. Cooperative. Very pleasant. Denies pain and discomfort. Matthew [name] stated that he walked in the hallway this morning. He ambulates to the bathroom when he needs to use.

## 2014-08-05 NOTE — Discharge Summary (Signed)
[facility] Discharge Summary  Patient name: Matthew [name] Medical record number: [id number] Date of birth: 13-Jul-1976 Age: 39 y.o. 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 male presenting with Sinus tachycardia s/p SVT en route. 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  Narrow complex tachycardia: Reported to have been in SVT per EMS with HR 237 prior to presentation. Was given Adenosine 59m x2 and Diltiazem 273mx1 en route. Thought to be related to patient missing doses of home diltiazem and recent alcohol binge.  Patient denied any chest pain or dyspnea, but some possible ischemic changes noted on initial EKG. Troponins neg x3 and repeat EKG unchanged.  Cardiology consulted on admission and followed throughout hospitalization.  Tachycardia initially managed with diltiazem drip, but transitioned to PO Dltiazem and Metoprolol.  Metoprolol and Diltiazem were increased as BP tolerated for better rate control through  remainder of hospitalization and patient discharged on Cardizem SR 1209mID and Metoprolol 61m13mD.  Alcohol abuse, withdrawal, DTs: On admission, patient reported alcohol binge for previous 2-3 days, drinking ~1 gallon of liquor per day.  He was initially monitored on SDU CIWA protocol with scheduled ativan, but had score of 27 on morning of 1/18.  He was transferred to ICU under CCM management for precedex drip.  Withdrawal symptoms stabilized, precedex drip stopped on 1/19, and patient transferred to floor on 1/20.  Scheduled ativan was slowly tapered as patient monitor per CIWA protocol.  Patient discharged on ativan taper (see med rec below).  SW was consulted on admission and provided substance abuse resources to patient and family.  Transaminitis 2/2 acute alcoholic hepatitis: AST 211 099 ALT 107 on admission.  Monitored closely and showed improvement while detoxing from alcohol (AST 63 and ALT 62 on day of discharge).  Electrolyte abnormalities: Hypokalemia, Hypocalcemia, Hypomagnesemia: K 3.0, Ca 6.2, Mg 0.6 on admission. Likely related to diarrhea and decreased PO intake in setting of alcohol abuse. Repleted with many doses of IV electrolytes.  Phosphorous wnl, but monitored closely to avoid refeeding syndrome. Electrolyte abnormalities resolved prior to discharge.  Fever: Patient febrile to 101.3 on 1/17. No leukocytosis or s/s of infection.  Blood cultures with no growth. Fever resolved and did not recur throughout remainder of hospitalization.  Diarrhea: Patient reporting diarrhea x3 days on admission.  Likely related to alcohol use. C Diff PCR negative. Diarrhea resolved after admission.  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 Patient hypoxic during admission, requiring CPAP qhs.  He was not on any pulmHTN meds and none were started in setting of transaminitis and h/o med nonadherence.  Macrocytosis: MCV 102.6  on admission, no anemia. Platelets normal on admission but decreased to 105 on  1/16, but improved without intervention prior to discharge (256 on day of discharge). Likely related to alcoholism.  HTN: BPs soft while titrating rate controlling agents as above, therefore limiting ability to rate control. Home Losartan held during admission and on discharge.  Chronic systolic CHF: Patient dehydrated on admission and euvolemic thereafter.  Home Lasix was held, as patient appeared euvolemic on exam and in setting of soft BPs.  A flutter, on chronic anticoagulation: Patient admitted to missing several weeks of coumadin prior to admission. INR 1.04 on admit. Coumadin dosed per pharmacy throughout admission. INR 2.17 on day of discharge.  All other chronic medical conditions stable throughout admission and managed with home regimens.  Issues for Follow Up:  - f/u HR and consider increasing MEtoprolol and/or Diltiazem for better rate control as BP tolerates. - f/u alcohol abuse counseling - patient expressed desire to go to inpatient alcohol rehab - Repeat CMET to look for improvement in LFTs and stability of electrolytes - OP Cardiology f/u for pulmonary HTN - Consider OP Sleep study to evaluate for OSA - Repeat CBC to evaluate macrocytosis and thrombocytopenia - Consider B12 and folate levels for macrocytosis - f/u volume status and consider restarting home lasix  Significant Procedures: none  Significant Labs and Imaging:   Recent Labs Lab 08/03/14 0237 08/04/14 0445 08/05/14 0500  WBC 4.8 3.8* 5.0  HGB 12.7* 13.0 12.6*  HCT 37.1* 38.7* 38.0*  PLT 181 207 256    Recent Labs Lab 07/30/14 1155  07/31/14 1000 08/01/14 0345 08/02/14 0217 08/03/14 0237 08/04/14 0445 08/05/14 0500  NA 138  < >  --  138 140 140 140 138  K 4.4  < >  --  3.6 3.4* 3.8 3.7 3.7  CL 103  < >  --  102 103 103 106 102  CO2 27  < >  --  24 25 32 26 26  GLUCOSE 110*  < >  --  88 87 93 92 82  BUN 7  < >  --   9 6 <5* <5* <5*  CREATININE 0.82  < >  --  1.03 0.97 0.78 0.68 0.69  CALCIUM 7.4*  < >  --  7.7* 7.8* 8.4 8.5 8.7  MG 1.3*  < >  --  1.3* 1.6 1.5 1.4* 1.3*  PHOS 2.6  --  3.3 2.3  --   --   --   --   ALKPHOS 64  < >  --  70 78 69 63 63  AST 210*  < >  --  132* 85* 54* 56* 63*  ALT 93*  < >  --  104* 87* 67* 58* 62*  ALBUMIN 2.7*  < >  --  2.9* 2.7* 2.6* 2.5* 2.8*  < > = values in this interval not displayed.  Drugs of Abuse     Component Value Date/Time   LABOPIA NONE DETECTED 07/28/2014 2035   COCAINSCRNUR NONE DETECTED 07/28/2014 2035   LABBENZ POSITIVE* 07/28/2014 2035   AMPHETMU NONE DETECTED 07/28/2014 2035   THCU NONE DETECTED 07/28/2014 2035   LABBARB NONE DETECTED 07/28/2014 2035    Hepatic Function Latest Ref Rng 08/05/2014 08/04/2014 08/03/2014  Total Protein 6.0 - 8.3 g/dL 5.9(L) 5.4(L) 6.0  Albumin 3.5 - 5.2 g/dL 2.8(L) 2.5(L) 2.6(L)  AST 0 - 37 U/L 63(H) 56(H) 54(H)  ALT 0 - 53 U/L 62(H) 58(H) 67(H)  Alk Phosphatase 39 - 117 U/L 63 63 69  Total Bilirubin 0.3 - 1.2 mg/dL 0.6  0.6 1.2  Bilirubin, Direct 0.0 - 0.3 mg/dL - - -    Magnesium 0.6 >> 1.4  Troponins neg x3  CXR (1/14): No active disease.  EKG (1/14): Sinus tachycardia, left axis deviation, RVH with repol abnormality, T wave inversion and ST depression in lateral leads  Results/Tests Pending at Time of Discharge: none  Discharge Medications:    Medication List    STOP taking these medications        acetaminophen 500 MG tablet  Commonly known as:  TYLENOL     ALPRAZolam 0.5 MG tablet  Commonly known as:  XANAX     furosemide 20 MG tablet  Commonly known as:  LASIX     losartan 50 MG tablet  Commonly known as:  COZAAR     potassium chloride SA 20 MEQ tablet  Commonly known as:  KLOR-CON M20      TAKE these medications        busPIRone 15 MG tablet  Commonly known as:  BUSPAR  Take 15 mg by mouth 2 (two) times daily.     diltiazem 120 MG 12 hr capsule  Commonly known as:  CARDIZEM SR   Take 1 capsule (120 mg total) by mouth 2 (two) times daily.     feeding supplement (ENSURE COMPLETE) Liqd  Take 237 mLs by mouth 2 (two) times daily between meals.     FLUoxetine 20 MG capsule  Commonly known as:  PROZAC  Take 20 mg by mouth daily.     folic acid 1 MG tablet  Commonly known as:  FOLVITE  Take 1 tablet (1 mg total) by mouth daily.     LORazepam 1 MG tablet  Commonly known as:  ATIVAN  68m every 8hrs for 1d, 156mevery 12hrs for 2 days, 0.5m64mvery 12hrs for 2 days, then 0.5mg1mily for 1 day     metoprolol 100 MG tablet  Commonly known as:  LOPRESSOR  Take 0.5 tablets (50 mg total) by mouth 2 (two) times daily.     multivitamin with minerals Tabs tablet  Take 1 tablet by mouth daily.     pantoprazole 40 MG tablet  Commonly known as:  PROTONIX  Take 40 mg by mouth daily.     QUEtiapine 50 MG Tb24 24 hr tablet  Commonly known as:  SEROQUEL XR  Take 1 tablet (50 mg total) by mouth at bedtime.     thiamine 100 MG tablet  Take 1 tablet (100 mg total) by mouth daily.     warfarin 7.5 MG tablet  Commonly known as:  COUMADIN  Take 1 tablet (7.5 mg total) by mouth daily at 6 PM.        Discharge Instructions: Please refer to Patient Instructions section of EMR for full details.  Patient was counseled important signs and symptoms that should prompt return to medical care, changes in medications, dietary instructions, activity restrictions, and follow up appointments.   Follow-Up Appointments: Follow-up Information    Follow up with PaulDorris Carnes On 09/01/2014.   Specialty:  Cardiology   Why:  2:15 PM   Contact information:   [id number] [phone]   Follow up with FUSCGlo HerringD.   Specialty:  Internal Medicine   Why:  ASAP For hospital follow up   Contact information:   [phone]       AngeLavon Paganini 08/05/2014, 2:32 PM  PGY-1, [place]

## 2014-08-05 NOTE — Progress Notes (Signed)
[facility] for Coumadin Indication: Hx of Aflutter  Allergies  Allergen Reactions  . Aspirin Other (See Comments)    Unknown    Patient Measurements: Height: 6\' 3"  (190.5 cm) Weight: 183 lb (83.008 kg) IBW/kg (Calculated) : 84.5  Vital Signs: Temp: 98 F (36.7 C) (01/22 0900) Temp Source: Oral (01/22 0900) BP: 103/79 mmHg (01/22 0900) Pulse Rate: 115 (01/22 0900)  Labs:  Recent Labs  08/03/14 0237 08/04/14 0445 08/05/14 0500  HGB 12.7* 13.0 12.6*  HCT 37.1* 38.7* 38.0*  PLT 181 207 256  LABPROT 22.6* 23.8* 24.4*  INR 1.97* 2.11* 2.17*  CREATININE 0.78 0.68 0.69    Estimated Creatinine Clearance: 149.9 mL/min (by C-G formula based on Cr of 0.69).   Assessment: 42 YOM w/ PMH of HTN, EtOH abuse, HLD, transposition of the great vessels. Patient is on PTA chronic coumadin and diltiazem for atrial flutter. Patient reports he has been noncompliant with his medications. INR now therapeutic at 2.17. CBC stable. No bleeding noted.  Per MD notes may transition to [place] but this would be done as an outpatient.  Goal of Therapy:  INR 2-3 Monitor platelets by anticoagulation protocol: Yes   Plan:  -Give coumadin 7.5mg  PO x 1 again tonight  -Monitor INR, CBC, s/s of bleed  Lariah Fleer D. Xitlalli Newhard, PharmD, BCPS Clinical Pharmacist Pager: [phone] 08/05/2014 11:05 AM

## 2014-08-05 NOTE — Progress Notes (Signed)
Family Medicine Teaching Service Daily Progress Note Intern Pager: [phone]  Patient name: Matthew [name] Medical record number: [id number] Date of birth: [birth year] Age: 39 y.o. Gender: male  Primary Care Provider: Glo Herring., MD Consultants: Cardiology, CCM Code Status: full  Pt Overview and Major Events to Date:  1/14: Admit for narrow complex tachycardia 1/18 AM: Transfer to ICU for CIWA score of 27 - needed precedex drip 1/19: Off of precedex 1/20: Transfer out of ICU and back to [place] 1/21: decrease scheduled ativan, CIWA scores 0  Assessment and Plan:  Matthew [name] is a 39 y.o. male presenting with Sinus tachycardia s/p SVT en route. PMH is significant for alcohol abuse, chronic systolic CHF,   Narrow complex tachycardia: Reported to have been in SVT per EMS with HR 237.Admits to missing a few days of home diltiazem. Denies CP/SOB, but some ischemic changes on EKG. Troponin neg x3. CXR clear. Likely related to missing medications and alcohol intake. - HR 98-115 O/N - Metoprolol 50mg  BID (home dose 100mg  BID) and Diltiazem 60mg  QID (home dose 180mg  SR BID) - BP will not tolerate increase in rate controlling agents currently - Monitor on telemetry - Cardiology consulted and signed off 1/16 - re-called by CCM 1/20 and agree with current plan to increase Metoprolol and Diltiazem as permitted by BP  Alcohol abuse, withdrawal, DTs: h/o hallucinations with withdrawal in the past. Last drink around 4am 1/14 per patient report. Patient endorses a recent 2-3 day alcohol binge with no PO intake.Ethanol level 23 on admission. S/p precedex drip in ICU 1/18-1/19.  - currently on Ativan 1mg  q6h IV - watch CIWA closely - scores 0 O/N - continue home thiamine and MVI - SW consult for substance abuse - UDS pos for benzos  Transaminitis 2/2 acute alcoholic hepatitis: LFTs improving. AST 56 and ALT 58 today. - Continue to monitor LFTs  Fever: Febrile to 101.3 on 1/17.  Resolved. WBC 4.8 - BCx (1/17) pending -- NGTD - Monitor clinically, no indication for antibiotics currently  Electrolyte abnormalities: Hypokalemia, Hypocalcemia, Hypomagnesemia: Likely related to diarrhea and decreased PO intake in setting of alcohol abuse. K 3.0 on admission >> 3.7. Ca 6.2 on admission >> 8.5. Magnesium 0.6 on admission >> 1.4. - s/p multiple repletion doses for Mg, K, Ca.  - Repeat oral mag suppl 800mg  - Continue to monitor qAM  Severe pulmonary HTN with hypoxia likely idiopathic 2/2 congenital heart disease:  - Plan for OP f/u with cardiology - Not on any pulm HTN meds currently, in setting of transaminitis and h/o med noncompliance - Consider OP sleep study - O2 prn, CPAP qhs  Diarrhea: Resolved. Likely related to alcohol use. No fevers. - c diff pcr negative  Macrocytosis, Thrombocytopenia: MCV 102.6, no anemia. Plts improving - 181 this AM. Likely related to alcoholism. 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 Followed by cardiology as OP. No signs of volume overload on exam. - Holding home Lasix in setting of dehydration; appears euvolumic today (although note that lasix no longer listed on home meds?)  - daily weights, strict Is&Os - Monitor fluid status carefully - Not currently on statin, likely related to elevated transaminases, consider as outpatient if transaminitis improves  HTN: BPs remain soft.  - Holding home losartan - Monitor BP closely  A flutter, on chronic  anticoagulation: Missed several weeks of coumadin. INR 1.04 on admit - Coumadin per pharm, INR 2.11 today - Consider NOAC but defer this decision to outpatient setting  Psych: Depression and anxiety - continue home Prozac, buspar, seroquel  FEN/GI: Heart healthy diet, SLIV Prophylaxis:  coumadin per pharm  Disposition: discharge today   Subjective:  No complaints this morning. Says he was up and walking with nurse this morning, did not feel lightheaded or dizzy while standing. He feels he is ready to go home today  Objective: Temp:  [97.5 F (36.4 C)-97.8 F (36.6 C)] 97.8 F (36.6 C) (01/22 0457) Pulse Rate:  [98-115] 115 (01/22 0457) Resp:  [18-20] 20 (01/22 0457) BP: (92-106)/(58-78) 95/58 mmHg (01/22 0457) SpO2:  [97 %-98 %] 97 % (01/22 0457) Weight:  [183 lb (83.008 kg)] 183 lb (83.008 kg) (01/22 0500) Physical Exam: General: NAD, well developed, lying in bed.  Cardiovascular: Tachycardic, regular rhythm, 2/6 systolic murmur at LUSB, no m/r/g. Respiratory: Normal WOB, CTAB, no w/r/c Abdomen: Soft, NTND, no masses, rebound, guarding.  Extremities: WWP, no LE edema Neuro: no tremors noted, speech normal  Laboratory:  Recent Labs Lab 08/03/14 0237 08/04/14 0445 08/05/14 0500  WBC 4.8 3.8* 5.0  HGB 12.7* 13.0 12.6*  HCT 37.1* 38.7* 38.0*  PLT 181 207 256    Recent Labs Lab 08/03/14 0237 08/04/14 0445 08/05/14 0500  NA 140 140 138  K 3.8 3.7 3.7  CL 103 106 102  CO2 32 26 26  BUN <5* <5* <5*  CREATININE 0.78 0.68 0.69  CALCIUM 8.4 8.5 8.7  PROT 6.0 5.4* 5.9*  BILITOT 1.2 0.6 0.6  ALKPHOS 69 63 63  ALT 67* 58* 62*  AST 54* 56* 63*  GLUCOSE 93 92 82    Magnesium: 0.6>>1.4  Troponin 0.00  INR 2.11  Imaging/Diagnostic Tests:  CXR (1/14): No active disease.  EKG (1/14): Sinus tachycardia, left axis deviation, RVH with repol abnormality, T wave inversion and ST depression in lateral leads  Leone Brand, MD 08/05/2014, 8:15 AM PGY-2, [place] Intern pager: [phone], text pages welcome

## 2014-08-07 LAB — CULTURE, BLOOD (ROUTINE X 2)
Culture: NO GROWTH
Culture: NO GROWTH

## 2014-08-08 ENCOUNTER — Encounter: Payer: Self-pay | Admitting: Internal Medicine

## 2014-09-01 ENCOUNTER — Encounter: Payer: Self-pay | Admitting: Internal Medicine

## 2014-09-13 ENCOUNTER — Encounter: Payer: Self-pay | Admitting: Internal Medicine

## 2014-09-23 ENCOUNTER — Encounter (HOSPITAL_COMMUNITY): Payer: Self-pay

## 2014-09-23 ENCOUNTER — Inpatient Hospital Stay (HOSPITAL_COMMUNITY)
Admission: EM | Admit: 2014-09-23 | Discharge: 2014-09-29 | DRG: 897 | Disposition: A | Discharge status: Home / Self Care | Payer: BLUE CROSS/BLUE SHIELD | Attending: Internal Medicine | Admitting: Internal Medicine

## 2014-09-23 ENCOUNTER — Other Ambulatory Visit (HOSPITAL_COMMUNITY): Payer: Self-pay

## 2014-09-23 ENCOUNTER — Emergency Department (HOSPITAL_COMMUNITY): Payer: BLUE CROSS/BLUE SHIELD

## 2014-09-23 DIAGNOSIS — F1010 Alcohol abuse, uncomplicated: Secondary | ICD-10-CM | POA: Diagnosis present

## 2014-09-23 DIAGNOSIS — F10231 Alcohol dependence with withdrawal delirium: Secondary | ICD-10-CM | POA: Diagnosis not present

## 2014-09-23 DIAGNOSIS — I471 Supraventricular tachycardia: Secondary | ICD-10-CM | POA: Diagnosis present

## 2014-09-23 DIAGNOSIS — I4892 Unspecified atrial flutter: Secondary | ICD-10-CM | POA: Diagnosis present

## 2014-09-23 DIAGNOSIS — Z7901 Long term (current) use of anticoagulants: Secondary | ICD-10-CM | POA: Diagnosis not present

## 2014-09-23 DIAGNOSIS — K219 Gastro-esophageal reflux disease without esophagitis: Secondary | ICD-10-CM | POA: Diagnosis present

## 2014-09-23 DIAGNOSIS — I10 Essential (primary) hypertension: Secondary | ICD-10-CM | POA: Diagnosis present

## 2014-09-23 DIAGNOSIS — F329 Major depressive disorder, single episode, unspecified: Secondary | ICD-10-CM | POA: Diagnosis present

## 2014-09-23 DIAGNOSIS — R002 Palpitations: Secondary | ICD-10-CM | POA: Diagnosis not present

## 2014-09-23 DIAGNOSIS — T462X6A Underdosing of other antidysrhythmic drugs, initial encounter: Secondary | ICD-10-CM | POA: Diagnosis present

## 2014-09-23 DIAGNOSIS — I5022 Chronic systolic (congestive) heart failure: Secondary | ICD-10-CM | POA: Diagnosis present

## 2014-09-23 DIAGNOSIS — F10939 Alcohol use, unspecified with withdrawal, unspecified: Secondary | ICD-10-CM | POA: Diagnosis present

## 2014-09-23 DIAGNOSIS — E872 Acidosis, unspecified: Secondary | ICD-10-CM | POA: Diagnosis present

## 2014-09-23 DIAGNOSIS — E860 Dehydration: Secondary | ICD-10-CM | POA: Diagnosis present

## 2014-09-23 DIAGNOSIS — Z87891 Personal history of nicotine dependence: Secondary | ICD-10-CM

## 2014-09-23 DIAGNOSIS — F419 Anxiety disorder, unspecified: Secondary | ICD-10-CM | POA: Diagnosis present

## 2014-09-23 DIAGNOSIS — Z9119 Patient's noncompliance with other medical treatment and regimen: Secondary | ICD-10-CM | POA: Diagnosis present

## 2014-09-23 DIAGNOSIS — E785 Hyperlipidemia, unspecified: Secondary | ICD-10-CM | POA: Diagnosis present

## 2014-09-23 DIAGNOSIS — E876 Hypokalemia: Secondary | ICD-10-CM | POA: Diagnosis not present

## 2014-09-23 DIAGNOSIS — F10239 Alcohol dependence with withdrawal, unspecified: Secondary | ICD-10-CM | POA: Diagnosis present

## 2014-09-23 DIAGNOSIS — F10230 Alcohol dependence with withdrawal, uncomplicated: Secondary | ICD-10-CM

## 2014-09-23 DIAGNOSIS — Z886 Allergy status to analgesic agent status: Secondary | ICD-10-CM

## 2014-09-23 DIAGNOSIS — Z9114 Patient's other noncompliance with medication regimen: Secondary | ICD-10-CM | POA: Diagnosis present

## 2014-09-23 HISTORY — DX: Supraventricular tachycardia, unspecified: I47.10

## 2014-09-23 HISTORY — DX: Headache, unspecified: R51.9

## 2014-09-23 HISTORY — DX: Gastro-esophageal reflux disease without esophagitis: K21.9

## 2014-09-23 HISTORY — DX: Supraventricular tachycardia: I47.1

## 2014-09-23 HISTORY — DX: Headache: R51

## 2014-09-23 HISTORY — DX: Anxiety disorder, unspecified: F41.9

## 2014-09-23 HISTORY — DX: Cardiac murmur, unspecified: R01.1

## 2014-09-23 LAB — CBC WITH DIFFERENTIAL/PLATELET
Basophils Absolute: 0 10*3/uL (ref 0.0–0.1)
Basophils Relative: 1 % (ref 0–1)
Eosinophils Absolute: 0 10*3/uL (ref 0.0–0.7)
Eosinophils Relative: 0 % (ref 0–5)
HCT: 44.5 % (ref 39.0–52.0)
Hemoglobin: 15.2 g/dL (ref 13.0–17.0)
Lymphocytes Relative: 9 % — ABNORMAL LOW (ref 12–46)
Lymphs Abs: 0.8 10*3/uL (ref 0.7–4.0)
MCH: 34.5 pg — ABNORMAL HIGH (ref 26.0–34.0)
MCHC: 34.2 g/dL (ref 30.0–36.0)
MCV: 100.9 fL — ABNORMAL HIGH (ref 78.0–100.0)
Monocytes Absolute: 0.5 10*3/uL (ref 0.1–1.0)
Monocytes Relative: 5 % (ref 3–12)
Neutro Abs: 7.6 10*3/uL (ref 1.7–7.7)
Neutrophils Relative %: 85 % — ABNORMAL HIGH (ref 43–77)
Platelets: 277 10*3/uL (ref 150–400)
RBC: 4.41 MIL/uL (ref 4.22–5.81)
RDW: 15.4 % (ref 11.5–15.5)
WBC: 8.9 10*3/uL (ref 4.0–10.5)

## 2014-09-23 LAB — BASIC METABOLIC PANEL
Anion gap: 25 — ABNORMAL HIGH (ref 5–15)
BUN: 10 mg/dL (ref 6–23)
CO2: 13 mmol/L — ABNORMAL LOW (ref 19–32)
Calcium: 8.4 mg/dL (ref 8.4–10.5)
Chloride: 103 mmol/L (ref 96–112)
Creatinine, Ser: 1.09 mg/dL (ref 0.50–1.35)
GFR calc Af Amer: >90 mL/min (ref >90)
GFR calc non Af Amer: 86 mL/min — ABNORMAL LOW (ref >90)
Glucose, Bld: 85 mg/dL (ref 70–99)
Potassium: 4.2 mmol/L (ref 3.5–5.1)
Sodium: 141 mmol/L (ref 135–145)

## 2014-09-23 LAB — PROTIME-INR
INR: 1.04 (ref 0.00–1.49)
Prothrombin Time: 13.8 seconds (ref 11.6–15.2)

## 2014-09-23 LAB — ETHANOL: Alcohol, Ethyl (B): 134 mg/dL — ABNORMAL HIGH (ref 0–9)

## 2014-09-23 LAB — TROPONIN I: Troponin I: 0.04 ng/mL — ABNORMAL HIGH (ref <0.031)

## 2014-09-23 LAB — BRAIN NATRIURETIC PEPTIDE: B Natriuretic Peptide: 164.8 pg/mL — ABNORMAL HIGH (ref 0.0–100.0)

## 2014-09-23 MED ORDER — SODIUM CHLORIDE 0.9 % IV BOLUS (SEPSIS)
1000.0000 mL | Freq: Once | INTRAVENOUS | Status: AC
  Administered 2014-09-23: 1000 mL via INTRAVENOUS

## 2014-09-23 MED ORDER — QUETIAPINE FUMARATE ER 50 MG PO TB24
50.0000 mg | ORAL_TABLET | Freq: Every day | ORAL | Status: DC
  Administered 2014-09-23 – 2014-09-28 (×5): 50 mg via ORAL
  Filled 2014-09-23 (×8): qty 1

## 2014-09-23 MED ORDER — ONDANSETRON HCL 4 MG/2ML IJ SOLN
4.0000 mg | Freq: Four times a day (QID) | INTRAMUSCULAR | Status: DC | PRN
  Administered 2014-09-24: 4 mg via INTRAVENOUS
  Filled 2014-09-23: qty 2

## 2014-09-23 MED ORDER — CETYLPYRIDINIUM CHLORIDE 0.05 % MT LIQD
7.0000 mL | Freq: Two times a day (BID) | OROMUCOSAL | Status: DC
  Administered 2014-09-24 – 2014-09-29 (×10): 7 mL via OROMUCOSAL

## 2014-09-23 MED ORDER — SODIUM CHLORIDE 0.9 % IV SOLN
INTRAVENOUS | Status: DC
  Administered 2014-09-23 – 2014-09-25 (×5): via INTRAVENOUS
  Administered 2014-09-26: 1000 mL via INTRAVENOUS
  Administered 2014-09-28: 15:00:00 via INTRAVENOUS

## 2014-09-23 MED ORDER — LORAZEPAM 1 MG PO TABS
1.0000 mg | ORAL_TABLET | Freq: Once | ORAL | Status: AC
  Administered 2014-09-23: 1 mg via ORAL
  Filled 2014-09-23: qty 1

## 2014-09-23 MED ORDER — LORAZEPAM 2 MG/ML IJ SOLN
1.0000 mg | Freq: Four times a day (QID) | INTRAMUSCULAR | Status: DC | PRN
  Administered 2014-09-25 (×2): 1 mg via INTRAVENOUS
  Filled 2014-09-23: qty 1

## 2014-09-23 MED ORDER — ACETAMINOPHEN 325 MG PO TABS
650.0000 mg | ORAL_TABLET | Freq: Once | ORAL | Status: AC
  Administered 2014-09-23: 650 mg via ORAL
  Filled 2014-09-23: qty 2

## 2014-09-23 MED ORDER — ONDANSETRON HCL 4 MG/2ML IJ SOLN: 4.0000 mg | Freq: Once | INTRAMUSCULAR | Status: DC

## 2014-09-23 MED ORDER — LORAZEPAM 1 MG PO TABS: 0.0000 mg | ORAL_TABLET | Freq: Two times a day (BID) | ORAL | Status: DC

## 2014-09-23 MED ORDER — SODIUM CHLORIDE 0.9 % IJ SOLN
3.0000 mL | Freq: Two times a day (BID) | INTRAMUSCULAR | Status: DC
Start: 2014-09-23 — End: 2014-09-29
  Administered 2014-09-23 – 2014-09-28 (×7): 3 mL via INTRAVENOUS

## 2014-09-23 MED ORDER — OXYCODONE HCL 5 MG PO TABS
5.0000 mg | ORAL_TABLET | ORAL | Status: DC | PRN
  Administered 2014-09-27 – 2014-09-28 (×3): 5 mg via ORAL
  Filled 2014-09-23 (×3): qty 1

## 2014-09-23 MED ORDER — ALUM & MAG HYDROXIDE-SIMETH 200-200-20 MG/5ML PO SUSP
30.0000 mL | Freq: Four times a day (QID) | ORAL | Status: DC | PRN
  Administered 2014-09-23: 30 mL via ORAL
  Filled 2014-09-23: qty 30

## 2014-09-23 MED ORDER — ENOXAPARIN SODIUM 100 MG/ML ~~LOC~~ SOLN
1.0000 mg/kg | Freq: Two times a day (BID) | SUBCUTANEOUS | Status: DC
  Administered 2014-09-23 – 2014-09-26 (×6): 85 mg via SUBCUTANEOUS
  Filled 2014-09-23 (×10): qty 1

## 2014-09-23 MED ORDER — FLUOXETINE HCL 20 MG PO CAPS
20.0000 mg | ORAL_CAPSULE | Freq: Every day | ORAL | Status: DC
  Administered 2014-09-23 – 2014-09-29 (×7): 20 mg via ORAL
  Filled 2014-09-23 (×7): qty 1

## 2014-09-23 MED ORDER — DILTIAZEM HCL ER 60 MG PO CP12
120.0000 mg | ORAL_CAPSULE | Freq: Two times a day (BID) | ORAL | Status: DC
  Administered 2014-09-23 – 2014-09-29 (×11): 120 mg via ORAL
  Filled 2014-09-23 (×14): qty 2

## 2014-09-23 MED ORDER — METOPROLOL TARTRATE 50 MG PO TABS
50.0000 mg | ORAL_TABLET | Freq: Two times a day (BID) | ORAL | Status: DC
  Administered 2014-09-23 – 2014-09-27 (×8): 50 mg via ORAL
  Filled 2014-09-23 (×11): qty 1

## 2014-09-23 MED ORDER — DILTIAZEM HCL 25 MG/5ML IV SOLN: 20.0000 mg | Freq: Once | INTRAVENOUS | Status: DC

## 2014-09-23 MED ORDER — LORAZEPAM 1 MG PO TABS
1.0000 mg | ORAL_TABLET | Freq: Four times a day (QID) | ORAL | Status: DC | PRN
  Administered 2014-09-24 (×3): 1 mg via ORAL
  Filled 2014-09-23 (×3): qty 1

## 2014-09-23 MED ORDER — METOPROLOL TARTRATE 25 MG PO TABS
100.0000 mg | ORAL_TABLET | Freq: Once | ORAL | Status: AC
  Administered 2014-09-23: 100 mg via ORAL
  Filled 2014-09-23: qty 4

## 2014-09-23 MED ORDER — WARFARIN SODIUM 7.5 MG PO TABS
7.5000 mg | ORAL_TABLET | Freq: Every day | ORAL | Status: DC
  Administered 2014-09-23: 7.5 mg via ORAL
  Filled 2014-09-23 (×2): qty 1

## 2014-09-23 MED ORDER — LORAZEPAM 2 MG/ML IJ SOLN
0.0000 mg | Freq: Two times a day (BID) | INTRAMUSCULAR | Status: DC
Start: 2014-09-26 — End: 2014-09-25
  Filled 2014-09-23: qty 2

## 2014-09-23 MED ORDER — LORAZEPAM 2 MG/ML IJ SOLN: 0.0000 mg | Freq: Two times a day (BID) | INTRAMUSCULAR | Status: DC

## 2014-09-23 MED ORDER — DILTIAZEM HCL 60 MG PO TABS
120.0000 mg | ORAL_TABLET | Freq: Once | ORAL | Status: AC
  Administered 2014-09-23: 120 mg via ORAL
  Filled 2014-09-23: qty 2

## 2014-09-23 MED ORDER — FOLIC ACID 1 MG PO TABS
1.0000 mg | ORAL_TABLET | Freq: Every day | ORAL | Status: DC
  Administered 2014-09-23 – 2014-09-29 (×7): 1 mg via ORAL
  Filled 2014-09-23 (×7): qty 1

## 2014-09-23 MED ORDER — THIAMINE HCL 100 MG/ML IJ SOLN
100.0000 mg | Freq: Every day | INTRAMUSCULAR | Status: DC
  Filled 2014-09-23: qty 1

## 2014-09-23 MED ORDER — ONDANSETRON HCL 4 MG PO TABS: 4.0000 mg | ORAL_TABLET | Freq: Four times a day (QID) | ORAL | Status: DC | PRN

## 2014-09-23 MED ORDER — HYDROMORPHONE HCL 1 MG/ML IJ SOLN: 0.5000 mg | INTRAMUSCULAR | Status: DC | PRN

## 2014-09-23 MED ORDER — ACETAMINOPHEN 325 MG PO TABS
650.0000 mg | ORAL_TABLET | Freq: Four times a day (QID) | ORAL | Status: DC | PRN
  Administered 2014-09-23 – 2014-09-24 (×2): 650 mg via ORAL
  Filled 2014-09-23 (×2): qty 2

## 2014-09-23 MED ORDER — ACETAMINOPHEN 650 MG RE SUPP: 650.0000 mg | Freq: Four times a day (QID) | RECTAL | Status: DC | PRN

## 2014-09-23 MED ORDER — LORAZEPAM 1 MG PO TABS
0.0000 mg | ORAL_TABLET | Freq: Four times a day (QID) | ORAL | Status: DC
Start: 2014-09-23 — End: 2014-09-23

## 2014-09-23 MED ORDER — WARFARIN - PHARMACIST DOSING INPATIENT
Freq: Every day | Status: DC
  Administered 2014-09-24 – 2014-09-25 (×2)

## 2014-09-23 MED ORDER — LORAZEPAM 2 MG/ML IJ SOLN
0.0000 mg | Freq: Four times a day (QID) | INTRAMUSCULAR | Status: DC
Start: 2014-09-23 — End: 2014-09-25
  Administered 2014-09-24: 2 mg via INTRAVENOUS
  Administered 2014-09-24: 1 mg via INTRAVENOUS
  Administered 2014-09-25: 2 mg via INTRAVENOUS
  Administered 2014-09-25: 4 mg via INTRAVENOUS
  Filled 2014-09-23 (×3): qty 1
  Filled 2014-09-23: qty 2

## 2014-09-23 MED ORDER — PANTOPRAZOLE SODIUM 40 MG PO TBEC
40.0000 mg | DELAYED_RELEASE_TABLET | Freq: Every day | ORAL | Status: DC
  Administered 2014-09-24 – 2014-09-29 (×6): 40 mg via ORAL
  Filled 2014-09-23 (×6): qty 1

## 2014-09-23 MED ORDER — ADENOSINE 6 MG/2ML IV SOLN
INTRAVENOUS | Status: AC
  Filled 2014-09-23: qty 4

## 2014-09-23 MED ORDER — ADULT MULTIVITAMIN W/MINERALS CH
1.0000 | ORAL_TABLET | Freq: Every day | ORAL | Status: DC
  Administered 2014-09-23 – 2014-09-29 (×7): 1 via ORAL
  Filled 2014-09-23 (×7): qty 1

## 2014-09-23 MED ORDER — ONDANSETRON HCL 4 MG/2ML IJ SOLN
4.0000 mg | Freq: Once | INTRAMUSCULAR | Status: AC
  Administered 2014-09-23: 4 mg via INTRAVENOUS
  Filled 2014-09-23: qty 2

## 2014-09-23 MED ORDER — VITAMIN B-1 100 MG PO TABS
100.0000 mg | ORAL_TABLET | Freq: Every day | ORAL | Status: DC
  Administered 2014-09-24 – 2014-09-29 (×6): 100 mg via ORAL
  Filled 2014-09-23 (×6): qty 1

## 2014-09-23 MED ORDER — LORAZEPAM 2 MG/ML IJ SOLN: 0.0000 mg | Freq: Four times a day (QID) | INTRAMUSCULAR | Status: DC

## 2014-09-23 MED ORDER — BUSPIRONE HCL 15 MG PO TABS
15.0000 mg | ORAL_TABLET | Freq: Two times a day (BID) | ORAL | Status: DC
  Administered 2014-09-23 – 2014-09-29 (×11): 15 mg via ORAL
  Filled 2014-09-23 (×14): qty 1

## 2014-09-23 NOTE — ED Notes (Addendum)
Pt with chest pain and weakness that started last night.  HR 212 in triage.  Attempted to make pt vagal in triage with no success. Pt sts he recently fell of the wagon and started drinking 4 days ago.  Last drink was this morning.

## 2014-09-23 NOTE — H&P (Signed)
[facility] Admission History and Physical       CLEBURN MAIOLO DJM:[id number] DOB: [birth year] DOA: 09/23/2014  Referring physician:  EDP  PCP: Glo Herring., MD  Specialists:   Chief Complaint: Palpitations  HPI: Matthew [name] is a 39 y.o. male with a history of Alcohol Abuse, Atrial Flutter and HTN who reports that he had abstained from alcohol until 2 weeks ago, and he began to drink one Fifth of alcohol every 2 days.   He presented to the ED due to increased palpitations, and he reports that he was having shakes and sweats.  His last drink was in the AM.  He was evaluated in the ED and found to have Atrial Flutter with AVR, and he reports not taking his medications due to his drinking.     Review of Systems:  Constitutional: No Weight Loss, No Weight Gain, +Sweats, Fevers, Chills, Dizziness, Light Headedness, Fatigue, or Generalized Weakness HEENT: No Headaches, Difficulty Swallowing,Tooth/Dental Problems,Sore Throat,  No Sneezing, Rhinitis, Ear Ache, Nasal Congestion, or Post Nasal Drip,  Cardio-vascular:  No Chest pain, Orthopnea, PND, Edema in Lower Extremities, Anasarca, Dizziness, +Palpitations  Resp: No Dyspnea, No DOE, No Productive Cough, No Non-Productive Cough, No Hemoptysis, No Wheezing.    GI: No Heartburn, Indigestion, Abdominal Pain, Nausea, Vomiting, Diarrhea, Constipation, Hematemesis, Hematochezia, Melena, Change in Bowel Habits,  Loss of Appetite  GU: No Dysuria, No Change in Color of Urine, No Urgency or Urinary Frequency, No Flank pain.  Musculoskeletal: No Joint Pain or Swelling, No Decreased Range of Motion, No Back Pain.  Neurologic: No Syncope, No Seizures, Muscle Weakness, Paresthesia, Vision Disturbance or Loss, No Diplopia, No Vertigo, No Difficulty Walking,  Skin: No Rash or Lesions. Psych: No Change in Mood or Affect, No Depression or Anxiety, No Memory loss, No Confusion, or Hallucinations   Past Medical History  Diagnosis Date    . Complete transposition of great vessels   . Hypertension   . Dyslipidemia   . Depression   . Alcohol abuse   . 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 SVT (supraventricular tachycardia)      Past Surgical History  Procedure Laterality Date  . Volar plate arthroplasty, right small finger proxima linterphalangeal joint    . Septostomy      rashkind  . Mustard procedure        Prior to Admission medications   Medication Sig Start Date End Date Taking? Authorizing Provider  busPIRone (BUSPAR) 15 MG tablet Take 15 mg by mouth 2 (two) times daily.   Yes Historical Provider, MD  diltiazem (CARDIZEM SR) 120 MG 12 hr capsule Take 1 capsule (120 mg total) by mouth 2 (two) times daily. 08/05/14  Yes Leone Brand, MD  FLUoxetine (PROZAC) 20 MG capsule Take 20 mg by mouth daily.   Yes Historical Provider, MD  folic acid (FOLVITE) 1 MG tablet Take 1 tablet (1 mg total) by mouth daily. 01/05/14  Yes Geradine Girt, DO  LORazepam (ATIVAN) 1 MG tablet 1mg  every 8hrs for 1d, 1mg  every 12hrs for 2 days, 0.5mg  every 12hrs for 2 days, then 0.5mg  daily for 1 day 08/05/14  Yes Leone Brand, MD  metoprolol (LOPRESSOR) 100 MG tablet Take 0.5 tablets (50 mg total) by mouth 2 (two) times daily. 08/05/14  Yes Leone Brand, MD  Multiple Vitamin (MULTIVITAMIN WITH MINERALS) TABS tablet Take 1 tablet by mouth daily. 04/23/14  Yes Erline Hau, MD  pantoprazole (PROTONIX) 40 MG tablet Take 40  mg by mouth daily.   Yes Historical Provider, MD  QUEtiapine (SEROQUEL XR) 50 MG TB24 24 hr tablet Take 1 tablet (50 mg total) by mouth at bedtime. 01/05/14  Yes Geradine Girt, DO  thiamine 100 MG tablet Take 1 tablet (100 mg total) by mouth daily. 04/23/14  Yes Erline Hau, MD  warfarin (COUMADIN) 7.5 MG tablet Take 1 tablet (7.5 mg total) by mouth daily at 6 PM. 04/23/14  Yes Estela Leonie Green, MD  feeding supplement, ENSURE COMPLETE, (ENSURE COMPLETE) LIQD Take 237 mLs by mouth 2 (two)  times daily between meals. Patient not taking: Reported on 07/28/2014 01/05/14   Geradine Girt, DO     Allergies  Allergen Reactions  . Aspirin Other (See Comments)    Unknown    Social History:  reports that he has never smoked. He does not have any smokeless tobacco history on file. He reports that he drinks alcohol. He reports that he does not use illicit drugs.    Family History  Problem Relation Age of Onset  . Rheumatic fever Father   . Hypertension Father        Physical Exam:  GEN:  Pleasant Obese  39 y.o. Caucasian male examined and in no acute distress; cooperative with exam Filed Vitals:   09/23/14 1945 09/23/14 2000 09/23/14 2054 09/23/14 2241  BP: 118/83 109/79 104/76 103/70  Pulse: 102 106 106 107  Temp:   97.8 F (36.6 C)   TempSrc:   Oral   Resp: 21 19 18 18   Weight:   83.598 kg (184 lb 4.8 oz)   SpO2: 98% 98% 97% 98%   Blood pressure 103/70, pulse 107, temperature 97.8 F (36.6 C), temperature source Oral, resp. rate 18, weight 83.598 kg (184 lb 4.8 oz), SpO2 98 %. PSYCH: He is alert and oriented x4; does not appear anxious does not appear depressed; affect is normal HEENT: Normocephalic and Atraumatic, Mucous membranes pink; PERRLA; EOM intact; Fundi:  Benign;  No scleral icterus, Nares: Patent, Oropharynx: Clear, Fair Dentition,    Neck:  FROM, No Cervical Lymphadenopathy nor Thyromegaly or Carotid Bruit; No JVD; Breasts:: Not examined CHEST WALL: No tenderness CHEST: Normal respiration, clear to auscultation bilaterally HEART: Tachycardic Irregular rhythm; no murmurs rubs or gallops BACK: No kyphosis or scoliosis; No CVA tenderness ABDOMEN: Positive Bowel Sounds, Obese, Soft Non-Tender, No Rebound or Guarding; No Masses, No Organomegaly. Rectal Exam: Not done EXTREMITIES: No Cyanosis, Clubbing, or Edema; No Ulcerations. Genitalia: not examined PULSES: 2+ and symmetric SKIN: Normal hydration no rash or ulceration CNS:  Alert and Oriented x 4, No  Focal Deficits Vascular: pulses palpable throughout    Labs on Admission:  Basic Metabolic Panel:  Recent Labs Lab 09/23/14 1606  NA 141  K 4.2  CL 103  CO2 13*  GLUCOSE 85  BUN 10  CREATININE 1.09  CALCIUM 8.4   Liver Function Tests: No results for input(s): AST, ALT, ALKPHOS, BILITOT, PROT, ALBUMIN in the last 168 hours. No results for input(s): LIPASE, AMYLASE in the last 168 hours. No results for input(s): AMMONIA in the last 168 hours. CBC:  Recent Labs Lab 09/23/14 1606  WBC 8.9  NEUTROABS 7.6  HGB 15.2  HCT 44.5  MCV 100.9*  PLT 277   Cardiac Enzymes:  Recent Labs Lab 09/23/14 1606  TROPONINI 0.04*    BNP (last 3 results)  Recent Labs  09/23/14 1606  BNP 164.8*    ProBNP (last 3 results) No results  for input(s): PROBNP in the last 8760 hours.  CBG: No results for input(s): GLUCAP in the last 168 hours.  Radiological Exams on Admission: Dg Chest 2 View  09/23/2014   CLINICAL DATA:  Tachycardia.  Weakness.  Chest pain.  EXAM: CHEST  2 VIEW  COMPARISON:  08/02/2014  FINDINGS: Thin sternotomy wires noted. The stable appearance of double contour along the aortic arch. Mild upper zone pulmonary vascular prominence. No cardiomegaly. No overt airway thickening.  No pleural effusion.  The lungs appear otherwise clear.  IMPRESSION: 1. Mild upper zone pulmonary vascular prominence could reflect pulmonary venous hypertension. No overt cardiomegaly. Prior pleural effusion and prior left basilar airspace opacity have resolved.   Electronically Signed   By: Van Clines M.D.   On: 09/23/2014 16:44     EKG: Independently reviewed. Atrial Flutter rate 115   Assessment/Plan:   39 y.o. male with  Principal Problem:   1.    Atrial flutter with rapid ventricular response   Improved with IVFs   Resume prescribed meds Diltiazem, Metoprolol and Coumadin Rx   Active Problems:   2.   HYPERTENSION, BENIGN   Continue Diltiaxzem and Metolprolo Rx   Monitor  BPs     3.   Alcohol abuse/Alcohol withdrawal-    CIWA Protocol with IV Ativan     4.   Metabolic acidosis- Due to #3 and Dehydration   IVFs     6.   Chronic systolic CHF (congestive heart failure)   Monitor For Signs of Fluid Overload     7.   DVT Prophylaxis   Full dose Lovenox Brdging and Re-institute Coumadin RX.          Code Status:     FULL CODE Family Communication:   No Family Present    Disposition Plan:    Inpatient Status        Time spent:  [facility] Pager [phone]   If [place] Please Contact the Day Rounding Team MD for [facility]  If 7PM-7AM, Please Contact Night-Floor Coverage  [email or web address] Password [email or web address] 09/23/2014, 11:47 PM     ADDENDUM:   Patient was seen and examined on 09/23/2014

## 2014-09-23 NOTE — Progress Notes (Signed)
ANTICOAGULATION CONSULT NOTE - Initial Consult  Pharmacy Consult for Lovenox Indication: atrial fibrillation  Allergies  Allergen Reactions  . Aspirin Other (See Comments)    Unknown    Patient Measurements: Weight: 182 lb 15.7 oz (83 kg)  Vital Signs: Temp: 98.3 F (36.8 C) (03/11 1405) Temp Source: Oral (03/11 1405) BP: 119/84 mmHg (03/11 1845) Pulse Rate: 107 (03/11 1845)  Labs:  Recent Labs  09/23/14 1606  HGB 15.2  HCT 44.5  PLT 277  LABPROT 13.8  INR 1.04  CREATININE 1.09  TROPONINI 0.04*    CrCl cannot be calculated (Unknown ideal weight.).   Medical History: Past Medical History  Diagnosis Date  . Complete transposition of great vessels   . Hypertension   . Dyslipidemia   . Depression   . Alcohol abuse   . 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 SVT (supraventricular tachycardia)     Medications:   (Not in a hospital admission) Scheduled:  . acetaminophen  650 mg Oral Once   Infusions:    Assessment: 39yo male with history of Afib presents with chest pain and weakness. Pharmacy is consulted to dose lovenox for atrial fibrillation. CBC is wnl, INR 1.04  PTA warfarin dose: 7.5mg  daily with last dose 3/10  Goal of Therapy:  INR 2-3 Monitor platelets by anticoagulation protocol: Yes   Plan:  Lovenox 1mg /kg BID Daily INR F/u warfarin dosing Continue to monitor H&H and platelets  Re-educate pt on warfarin  Andrey Cota. Diona Foley, PharmD Clinical Pharmacist Pager [phone] 09/23/2014,7:00 PM

## 2014-09-23 NOTE — ED Notes (Signed)
Report provided to [facility]. Patient currently states his pain has improved during ED stay but remains a 2 out of 10. Belongings are sent with patient.

## 2014-09-23 NOTE — Progress Notes (Signed)
PATIENT ARRIVED TO TELE UNIT FROM ED VIA STRETCHER. ASSISTED TO BED. TELE APPLIED. VITALS OBTAINED. ASSESSMENT PERFORMED.  BEDSIDE TABLE AND BELONGINGS WITHIN REACH. PATIENT INSTRUCTED TO CALL FOR ASSISTANCE WHEN NEEDED.

## 2014-09-24 ENCOUNTER — Encounter (HOSPITAL_COMMUNITY): Payer: Self-pay | Admitting: General Practice

## 2014-09-24 LAB — BASIC METABOLIC PANEL
Anion gap: 9 (ref 5–15)
BUN: 8 mg/dL (ref 6–23)
CO2: 29 mmol/L (ref 19–32)
Calcium: 8.9 mg/dL (ref 8.4–10.5)
Chloride: 100 mmol/L (ref 96–112)
Creatinine, Ser: 0.81 mg/dL (ref 0.50–1.35)
GFR calc Af Amer: >90 mL/min (ref >90)
GFR calc non Af Amer: >90 mL/min (ref >90)
Glucose, Bld: 104 mg/dL — ABNORMAL HIGH (ref 70–99)
Potassium: 3.8 mmol/L (ref 3.5–5.1)
Sodium: 138 mmol/L (ref 135–145)

## 2014-09-24 LAB — RAPID URINE DRUG SCREEN, HOSP PERFORMED
Amphetamines: NOT DETECTED
Barbiturates: NOT DETECTED
Benzodiazepines: POSITIVE — AB
Cocaine: NOT DETECTED
Opiates: NOT DETECTED
Tetrahydrocannabinol: POSITIVE — AB

## 2014-09-24 LAB — CBC
HCT: 40.6 % (ref 39.0–52.0)
Hemoglobin: 14 g/dL (ref 13.0–17.0)
MCH: 34.7 pg — ABNORMAL HIGH (ref 26.0–34.0)
MCHC: 34.5 g/dL (ref 30.0–36.0)
MCV: 100.5 fL — ABNORMAL HIGH (ref 78.0–100.0)
Platelets: 223 10*3/uL (ref 150–400)
RBC: 4.04 MIL/uL — ABNORMAL LOW (ref 4.22–5.81)
RDW: 15.3 % (ref 11.5–15.5)
WBC: 7.3 10*3/uL (ref 4.0–10.5)

## 2014-09-24 LAB — MRSA PCR SCREENING: MRSA by PCR: NEGATIVE

## 2014-09-24 LAB — PROTIME-INR
INR: 1.05 (ref 0.00–1.49)
Prothrombin Time: 13.8 seconds (ref 11.6–15.2)

## 2014-09-24 MED ORDER — WARFARIN SODIUM 10 MG PO TABS
10.0000 mg | ORAL_TABLET | Freq: Once | ORAL | Status: AC
  Administered 2014-09-24: 10 mg via ORAL
  Filled 2014-09-24: qty 1

## 2014-09-24 NOTE — Progress Notes (Addendum)
ANTICOAGULATION CONSULT NOTE - [facility] for Lovenox, Coumadin Indication: atrial fibrillation  Allergies  Allergen Reactions  . Aspirin Other (See Comments)    Unknown    Patient Measurements: Height: 6\' 3"  (190.5 cm) Weight: 184 lb 4.8 oz (83.598 kg) IBW/kg (Calculated) : 84.5  Vital Signs: Temp: 97.8 F (36.6 C) (03/12 0510) Temp Source: Oral (03/12 0510) BP: 112/74 mmHg (03/12 0932) Pulse Rate: 112 (03/12 0932)  Labs:  Recent Labs  09/23/14 1606 09/24/14 0350 09/24/14 1208  HGB 15.2 14.0  --   HCT 44.5 40.6  --   PLT 277 223  --   LABPROT 13.8  --  13.8  INR 1.04  --  1.05  CREATININE 1.09 0.81  --   TROPONINI 0.04*  --   --     Estimated Creatinine Clearance: 149.1 mL/min (by C-G formula based on Cr of 0.81).   Medical History: Past Medical History  Diagnosis Date  . Complete transposition of great vessels   . Hypertension   . Dyslipidemia   . Depression   . Alcohol abuse   . 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 SVT (supraventricular tachycardia) "several times in the last year" (09/24/2014)  . Heart murmur   . Anxiety   . GERD (gastroesophageal reflux disease)   . Headache     "weekly" (09/24/2014)    Medications:  Prescriptions prior to admission  Medication Sig Dispense Refill Last Dose  . busPIRone (BUSPAR) 15 MG tablet Take 15 mg by mouth 2 (two) times daily.   Past Week at Unknown time  . diltiazem (CARDIZEM SR) 120 MG 12 hr capsule Take 1 capsule (120 mg total) by mouth 2 (two) times daily. 60 capsule 1 09/23/2014 at Unknown time  . FLUoxetine (PROZAC) 20 MG capsule Take 20 mg by mouth daily.   Past Week at Unknown time  . folic acid (FOLVITE) 1 MG tablet Take 1 tablet (1 mg total) by mouth daily.   09/23/2014 at Unknown time  . LORazepam (ATIVAN) 1 MG tablet 1mg  every 8hrs for 1d, 1mg  every 12hrs for 2 days, 0.5mg  every 12hrs for 2 days, then 0.5mg  daily for 1 day 30 tablet 0 Past Month at Unknown time  .  metoprolol (LOPRESSOR) 100 MG tablet Take 0.5 tablets (50 mg total) by mouth 2 (two) times daily. 60 tablet 1 09/23/2014 at 1000  . Multiple Vitamin (MULTIVITAMIN WITH MINERALS) TABS tablet Take 1 tablet by mouth daily.   09/23/2014 at Unknown time  . pantoprazole (PROTONIX) 40 MG tablet Take 40 mg by mouth daily.   09/23/2014 at Unknown time  . QUEtiapine (SEROQUEL XR) 50 MG TB24 24 hr tablet Take 1 tablet (50 mg total) by mouth at bedtime. 30 each 0 Past Month at Unknown time  . thiamine 100 MG tablet Take 1 tablet (100 mg total) by mouth daily. 30 tablet 11 09/23/2014 at Unknown time  . warfarin (COUMADIN) 7.5 MG tablet Take 1 tablet (7.5 mg total) by mouth daily at 6 PM. 30 tablet 1 09/22/2014 at Unknown time  . feeding supplement, ENSURE COMPLETE, (ENSURE COMPLETE) LIQD Take 237 mLs by mouth 2 (two) times daily between meals. (Patient not taking: Reported on 07/28/2014)   Not Taking at Unknown time    Assessment: 39yo male with history of Afib presents with chest pain and weakness. Pharmacy is consulted to dose lovenox for atrial fibrillation. CBC stable,  INR today is 1.05.  PTA warfarin dose: 7.5mg  daily with last dose  3/10  Goal of Therapy:  INR 2-3 Monitor platelets by anticoagulation protocol: Yes   Plan:  Continue Lovenox 85 mg/kg BID Daily INR Warfarin 10 mg x 1  Continue to monitor H&H and platelets  Re-educate pt on warfarin  Uvaldo Rising, BCPS  Clinical Pharmacist Pager [phone]  09/24/2014 1:05 PM

## 2014-09-24 NOTE — ED Provider Notes (Signed)
CSN: [id number]     Arrival date & time 09/23/14  1349 History   First MD Initiated Contact with Patient 09/23/14 1416     Chief Complaint  Patient presents with  . Tachycardia  . Chest Pain     (Consider location/radiation/quality/duration/timing/severity/associated sxs/prior Treatment) HPI Comments: Pt reports recent ETOH binge and states he has not been eating or drinking otherwise. He started feeling bad yesterday, has palpitations, SOB, denies CP, fever, chills, cough. He has been noncompliant w/ meds.    Past Medical History  Diagnosis Date  . Complete transposition of great vessels   . Hypertension   . Dyslipidemia   . Depression   . Alcohol abuse   . 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 SVT (supraventricular tachycardia) "several times in the last year" (09/24/2014)  . Heart murmur   . Anxiety   . GERD (gastroesophageal reflux disease)   . Headache     "weekly" (09/24/2014)   Past Surgical History  Procedure Laterality Date  . Finger arthroplasty Right     volar plate arthroplasty, right small finger proxima linterphalangeal joint [Other]  . Septostomy      Rashkind balloon atrial septostomy   . Transposition of great vessels repair  1980    "mustard procedure"  . Fracture surgery     Family History  Problem Relation Age of Onset  . Rheumatic fever Father   . Hypertension Father    History  Substance Use Topics  . Smoking status: Never Smoker   . Smokeless tobacco: Former Systems developer    Types: Snuff     Comment: "dipped when I played softball; none since 2011"  . Alcohol Use: 36.0 oz/week    60 Shots of liquor per week     Comment: 09/24/2014 "1/5th vodka in 2 days; easy"    Review of Systems  Constitutional: Positive for fatigue. Negative for fever, activity change and appetite change.  HENT: Negative for congestion, facial swelling, rhinorrhea and trouble swallowing.   Eyes: Negative for photophobia and pain.  Respiratory: Negative for cough, chest  tightness and shortness of breath.   Cardiovascular: Positive for palpitations. Negative for chest pain and leg swelling.  Gastrointestinal: Negative for nausea, vomiting, abdominal pain, diarrhea and constipation.  Endocrine: Negative for polydipsia and polyuria.  Genitourinary: Negative for dysuria, urgency, decreased urine volume and difficulty urinating.  Musculoskeletal: Negative for back pain and gait problem.  Skin: Negative for color change, rash and wound.  Allergic/Immunologic: Negative for immunocompromised state.  Neurological: Negative for dizziness, facial asymmetry, speech difficulty, weakness, numbness and headaches.  Psychiatric/Behavioral: Negative for confusion, decreased concentration and agitation.      Allergies  Aspirin  Home Medications   Prior to Admission medications   Medication Sig Start Date End Date Taking? Authorizing Provider  busPIRone (BUSPAR) 15 MG tablet Take 15 mg by mouth 2 (two) times daily.   Yes Historical Provider, MD  diltiazem (CARDIZEM SR) 120 MG 12 hr capsule Take 1 capsule (120 mg total) by mouth 2 (two) times daily. 08/05/14  Yes Leone Brand, MD  FLUoxetine (PROZAC) 20 MG capsule Take 20 mg by mouth daily.   Yes Historical Provider, MD  folic acid (FOLVITE) 1 MG tablet Take 1 tablet (1 mg total) by mouth daily. 01/05/14  Yes Geradine Girt, DO  LORazepam (ATIVAN) 1 MG tablet 1mg  every 8hrs for 1d, 1mg  every 12hrs for 2 days, 0.5mg  every 12hrs for 2 days, then 0.5mg  daily for 1 day 08/05/14  Yes Mitzi Hansen  Siri Cole, MD  metoprolol (LOPRESSOR) 100 MG tablet Take 0.5 tablets (50 mg total) by mouth 2 (two) times daily. 08/05/14  Yes Leone Brand, MD  Multiple Vitamin (MULTIVITAMIN WITH MINERALS) TABS tablet Take 1 tablet by mouth daily. 04/23/14  Yes Erline Hau, MD  pantoprazole (PROTONIX) 40 MG tablet Take 40 mg by mouth daily.   Yes Historical Provider, MD  QUEtiapine (SEROQUEL XR) 50 MG TB24 24 hr tablet Take 1 tablet (50 mg  total) by mouth at bedtime. 01/05/14  Yes Geradine Girt, DO  thiamine 100 MG tablet Take 1 tablet (100 mg total) by mouth daily. 04/23/14  Yes Erline Hau, MD  warfarin (COUMADIN) 7.5 MG tablet Take 1 tablet (7.5 mg total) by mouth daily at 6 PM. 04/23/14  Yes Estela Leonie Green, MD  feeding supplement, ENSURE COMPLETE, (ENSURE COMPLETE) LIQD Take 237 mLs by mouth 2 (two) times daily between meals. Patient not taking: Reported on 07/28/2014 01/05/14   Tomi Bamberger Vann, DO   BP 112/74 mmHg  Pulse 112  Temp(Src) 97.8 F (36.6 C) (Oral)  Resp 18  Ht 6\' 3"  (1.905 m)  Wt 184 lb 4.8 oz (83.598 kg)  BMI 23.04 kg/m2  SpO2 97% Physical Exam  Constitutional: He is oriented to person, place, and time. He appears well-developed and well-nourished. No distress.  HENT:  Head: Normocephalic and atraumatic.  Mouth/Throat: No oropharyngeal exudate.  Eyes: Pupils are equal, round, and reactive to light.  Neck: Normal range of motion. Neck supple.  Cardiovascular: Regular rhythm and normal heart sounds.  Tachycardia present.  Exam reveals no gallop and no friction rub.   No murmur heard. Pulmonary/Chest: Effort normal and breath sounds normal. No respiratory distress. He has no wheezes. He has no rales.  Abdominal: Soft. Bowel sounds are normal. He exhibits no distension and no mass. There is no tenderness. There is no rebound and no guarding.  Musculoskeletal: Normal range of motion. He exhibits no edema or tenderness.  Neurological: He is alert and oriented to person, place, and time.  Skin: Skin is warm and dry.  Psychiatric: He has a normal mood and affect.    ED Course  Procedures (including critical care time) Labs Review Labs Reviewed  BASIC METABOLIC PANEL - Abnormal; Notable for the following:    CO2 13 (*)    GFR calc non Af Amer 86 (*)    Anion gap 25 (*)    All other components within normal limits  TROPONIN I - Abnormal; Notable for the following:    Troponin I  0.04 (*)    All other components within normal limits  BRAIN NATRIURETIC PEPTIDE - Abnormal; Notable for the following:    B Natriuretic Peptide 164.8 (*)    All other components within normal limits  CBC WITH DIFFERENTIAL/PLATELET - Abnormal; Notable for the following:    MCV 100.9 (*)    MCH 34.5 (*)    Neutrophils Relative % 85 (*)    Lymphocytes Relative 9 (*)    All other components within normal limits  ETHANOL - Abnormal; Notable for the following:    Alcohol, Ethyl (B) 134 (*)    All other components within normal limits  URINE RAPID DRUG SCREEN (HOSP PERFORMED) - Abnormal; Notable for the following:    Benzodiazepines POSITIVE (*)    Tetrahydrocannabinol POSITIVE (*)    All other components within normal limits  BASIC METABOLIC PANEL - Abnormal; Notable for the following:  Glucose, Bld 104 (*)    All other components within normal limits  CBC - Abnormal; Notable for the following:    RBC 4.04 (*)    MCV 100.5 (*)    MCH 34.7 (*)    All other components within normal limits  MRSA PCR SCREENING  PROTIME-INR    Imaging Review Dg Chest 2 View  09/23/2014   CLINICAL DATA:  Tachycardia.  Weakness.  Chest pain.  EXAM: CHEST  2 VIEW  COMPARISON:  08/02/2014  FINDINGS: Thin sternotomy wires noted. The stable appearance of double contour along the aortic arch. Mild upper zone pulmonary vascular prominence. No cardiomegaly. No overt airway thickening.  No pleural effusion.  The lungs appear otherwise clear.  IMPRESSION: 1. Mild upper zone pulmonary vascular prominence could reflect pulmonary venous hypertension. No overt cardiomegaly. Prior pleural effusion and prior left basilar airspace opacity have resolved.   Electronically Signed   By: Van Clines M.D.   On: 09/23/2014 16:44     EKG Interpretation   Date/Time:  Friday September 23 2014 15:42:25 EST Ventricular Rate:  115 PR Interval:  112 QRS Duration: 98 QT Interval:  376 QTC Calculation: 520 R Axis:   -149 Text  Interpretation:  Atrial flutter with predominant 2:1 AV block RVH  with secondary repolarization abnrm ST depression, consider ischemia,  diffuse lds Prolonged QT interval Confirmed by Aashi Derrington  MD, [facility] ([id number])  on 09/23/2014 4:44:28 PM      MDM   Final diagnoses:  Atrial flutter with rapid ventricular response    Pt is a 39 y.o. male with Pmhx as above who presents with tachycardia/palpitaitons in setting of recent ETOH binge.  Despite CC, pt denies chest pain.  First EKG with regular narrow complex rhythm with rate around 200. 2L IVF given with improvement of HR, revealing atrial flutter. Pt given his home dilt & metoprolol. Plan for Dr. Doy Mince to follow labs, INR. If HR not controlled, plan on admission to cardiology.      Ernestina Patches, MD 09/24/14 1007

## 2014-09-24 NOTE — Progress Notes (Signed)
[facility] PROGRESS NOTE  Matthew [name] NLZ:[id number] DOB: 1975-09-23 DOA: 09/23/2014 PCP: Glo Herring., MD  Assessment/Plan: 1. Aflutter with RVR -now rate controlled -continue PO diltiazem, metoprolol -resume warfarin   2. ETOH withdrawal -continue IVF, Ativan per CIWA protocol -if worsens will transfer to ICU, required precedex last admission -continue folic acid and thiamine -check mag  3. Depression -continue seroquel  DVt proph: on warfarin  Code Status: Full COde Family Communication: none at bedside Disposition Plan: transfer to SDU if worsens   HPI/Subjective: Feels a little restless  Objective: Filed Vitals:   09/24/14 0932  BP: 112/74  Pulse: 112  Temp:   Resp:     Intake/Output Summary (Last 24 hours) at 09/24/14 1308 Last data filed at 09/24/14 0500  Gross per 24 hour  Intake      3 ml  Output      4 ml  Net     -1 ml   Filed Weights   09/23/14 1900 09/23/14 2054  Weight: 83 kg (182 lb 15.7 oz) 83.598 kg (184 lb 4.8 oz)    Exam:   General:  AAOx3, slightly restless, no asterixes  Cardiovascular: S1S2/RRR  Respiratory: CTAB  Abdomen: soft, NT, BS present  Musculoskeletal: no edema c/c  Neuro: non focal  Data Reviewed: Basic Metabolic Panel:  Recent Labs Lab 09/23/14 1606 09/24/14 0350  NA 141 138  K 4.2 3.8  CL 103 100  CO2 13* 29  GLUCOSE 85 104*  BUN 10 8  CREATININE 1.09 0.81  CALCIUM 8.4 8.9   Liver Function Tests: No results for input(s): AST, ALT, ALKPHOS, BILITOT, PROT, ALBUMIN in the last 168 hours. No results for input(s): LIPASE, AMYLASE in the last 168 hours. No results for input(s): AMMONIA in the last 168 hours. CBC:  Recent Labs Lab 09/23/14 1606 09/24/14 0350  WBC 8.9 7.3  NEUTROABS 7.6  --   HGB 15.2 14.0  HCT 44.5 40.6  MCV 100.9* 100.5*  PLT 277 223   Cardiac Enzymes:  Recent Labs Lab 09/23/14 1606  TROPONINI 0.04*   BNP (last 3 results)  Recent Labs   09/23/14 1606  BNP 164.8*    ProBNP (last 3 results) No results for input(s): PROBNP in the last 8760 hours.  CBG: No results for input(s): GLUCAP in the last 168 hours.  Recent Results (from the past 240 hour(s))  MRSA PCR Screening     Status: None   Collection Time: 09/24/14 12:30 AM  Result Value Ref Range Status   MRSA by PCR NEGATIVE NEGATIVE Final    Comment:        The GeneXpert MRSA Assay (FDA approved for NASAL specimens only), is one component of a comprehensive MRSA colonization surveillance program. It is not intended to diagnose MRSA infection nor to guide or monitor treatment for MRSA infections.      Studies: Dg Chest 2 View  09/23/2014   CLINICAL DATA:  Tachycardia.  Weakness.  Chest pain.  EXAM: CHEST  2 VIEW  COMPARISON:  08/02/2014  FINDINGS: Thin sternotomy wires noted. The stable appearance of double contour along the aortic arch. Mild upper zone pulmonary vascular prominence. No cardiomegaly. No overt airway thickening.  No pleural effusion.  The lungs appear otherwise clear.  IMPRESSION: 1. Mild upper zone pulmonary vascular prominence could reflect pulmonary venous hypertension. No overt cardiomegaly. Prior pleural effusion and prior left basilar airspace opacity have resolved.   Electronically Signed   By: Van Clines M.D.   On:  09/23/2014 16:44    Scheduled Meds: . antiseptic oral rinse  7 mL Mouth Rinse BID  . busPIRone  15 mg Oral BID  . diltiazem  120 mg Oral BID  . enoxaparin (LOVENOX) injection  1 mg/kg Subcutaneous Q12H  . FLUoxetine  20 mg Oral Daily  . folic acid  1 mg Oral Daily  . LORazepam  0-4 mg Intravenous Q6H   Followed by  . [START ON 09/26/2014] LORazepam  0-4 mg Intravenous Q12H  . metoprolol  50 mg Oral BID  . multivitamin with minerals  1 tablet Oral Daily  . pantoprazole  40 mg Oral Daily  . QUEtiapine  50 mg Oral QHS  . sodium chloride  3 mL Intravenous Q12H  . thiamine  100 mg Oral Daily  . warfarin  10 mg Oral  ONCE-1800  . Warfarin - Pharmacist Dosing Inpatient   Does not apply q1800   Continuous Infusions: . sodium chloride 100 mL/hr at 09/23/14 2259   Antibiotics Given (last 72 hours)    None      Principal Problem:   Atrial flutter with rapid ventricular response Active Problems:   HYPERTENSION, BENIGN   Atrial flutter   Alcohol abuse   Alcohol withdrawal   Metabolic acidosis   Chronic systolic CHF (congestive heart failure)    Time spent: 46min    [place] [facility] Pager [phone]. If 7PM-7AM, please contact night-coverage at [email or web address], password [facility] 09/24/2014, 1:08 PM  LOS: 1 day

## 2014-09-25 LAB — PROTIME-INR
INR: 1.24 (ref 0.00–1.49)
Prothrombin Time: 15.7 seconds — ABNORMAL HIGH (ref 11.6–15.2)

## 2014-09-25 LAB — MAGNESIUM: Magnesium: 1.4 mg/dL — ABNORMAL LOW (ref 1.5–2.5)

## 2014-09-25 MED ORDER — WARFARIN SODIUM 10 MG PO TABS
10.0000 mg | ORAL_TABLET | Freq: Once | ORAL | Status: AC
  Administered 2014-09-25: 10 mg via ORAL
  Filled 2014-09-25: qty 1

## 2014-09-25 MED ORDER — LORAZEPAM 2 MG/ML IJ SOLN
1.0000 mg | Freq: Once | INTRAMUSCULAR | Status: AC
  Administered 2014-09-25: 1 mg via INTRAVENOUS
  Filled 2014-09-25: qty 1

## 2014-09-25 NOTE — Progress Notes (Signed)
M.D paged again post 1 mg Ativan IV; CIWA 47. M.D. Informed RN to admin PRN dose 1mg  Ativan early. Pt was given Ativan 1mg  IV PRN dose. Will cont to monitor.

## 2014-09-25 NOTE — Progress Notes (Signed)
Utilization Review Completed.   Caryle Helgeson, RN, BSN Nurse Case Manager  

## 2014-09-25 NOTE — Progress Notes (Signed)
Paged M.D regarding pt CIWA 46. New order given to admin 1mg  IV Ativan. RN will follow orders and cont to monitor.

## 2014-09-25 NOTE — Progress Notes (Signed)
ANTICOAGULATION CONSULT NOTE - [place] for Lovenox, Coumadin Indication: atrial fibrillation  Allergies  Allergen Reactions  . Aspirin Other (See Comments)    Unknown    Patient Measurements: Height: 6\' 3"  (190.5 cm) Weight: 184 lb 4.8 oz (83.598 kg) IBW/kg (Calculated) : 84.5  Vital Signs: Temp: 98 F (36.7 C) (03/13 0453) Temp Source: Oral (03/13 0453) BP: 97/64 mmHg (03/13 0453) Pulse Rate: 86 (03/13 0453)  Labs:  Recent Labs  09/23/14 1606 09/24/14 0350 09/24/14 1208 09/25/14 0506  HGB 15.2 14.0  --   --   HCT 44.5 40.6  --   --   PLT 277 223  --   --   LABPROT 13.8  --  13.8 15.7*  INR 1.04  --  1.05 1.24  CREATININE 1.09 0.81  --   --   TROPONINI 0.04*  --   --   --     Estimated Creatinine Clearance: 149.1 mL/min (by C-G formula based on Cr of 0.81).   Medical History: Past Medical History  Diagnosis Date  . Complete transposition of great vessels   . Hypertension   . Dyslipidemia   . Depression   . Alcohol abuse   . 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 SVT (supraventricular tachycardia) "several times in the last year" (09/24/2014)  . Heart murmur   . Anxiety   . GERD (gastroesophageal reflux disease)   . Headache     "weekly" (09/24/2014)    Medications:  Prescriptions prior to admission  Medication Sig Dispense Refill Last Dose  . busPIRone (BUSPAR) 15 MG tablet Take 15 mg by mouth 2 (two) times daily.   Past Week at Unknown time  . diltiazem (CARDIZEM SR) 120 MG 12 hr capsule Take 1 capsule (120 mg total) by mouth 2 (two) times daily. 60 capsule 1 09/23/2014 at Unknown time  . FLUoxetine (PROZAC) 20 MG capsule Take 20 mg by mouth daily.   Past Week at Unknown time  . folic acid (FOLVITE) 1 MG tablet Take 1 tablet (1 mg total) by mouth daily.   09/23/2014 at Unknown time  . LORazepam (ATIVAN) 1 MG tablet 1mg  every 8hrs for 1d, 1mg  every 12hrs for 2 days, 0.5mg  every 12hrs for 2 days, then 0.5mg  daily for 1 day 30  tablet 0 Past Month at Unknown time  . metoprolol (LOPRESSOR) 100 MG tablet Take 0.5 tablets (50 mg total) by mouth 2 (two) times daily. 60 tablet 1 09/23/2014 at 1000  . Multiple Vitamin (MULTIVITAMIN WITH MINERALS) TABS tablet Take 1 tablet by mouth daily.   09/23/2014 at Unknown time  . pantoprazole (PROTONIX) 40 MG tablet Take 40 mg by mouth daily.   09/23/2014 at Unknown time  . QUEtiapine (SEROQUEL XR) 50 MG TB24 24 hr tablet Take 1 tablet (50 mg total) by mouth at bedtime. 30 each 0 Past Month at Unknown time  . thiamine 100 MG tablet Take 1 tablet (100 mg total) by mouth daily. 30 tablet 11 09/23/2014 at Unknown time  . warfarin (COUMADIN) 7.5 MG tablet Take 1 tablet (7.5 mg total) by mouth daily at 6 PM. 30 tablet 1 09/22/2014 at Unknown time  . feeding supplement, ENSURE COMPLETE, (ENSURE COMPLETE) LIQD Take 237 mLs by mouth 2 (two) times daily between meals. (Patient not taking: Reported on 07/28/2014)   Not Taking at Unknown time    Assessment: 39yo male with history of Afib presents with chest pain and weakness. Pharmacy is consulted to dose lovenox  for atrial fibrillation. CBC stable,  INR today is starting to rise a bit to 1.24.  PTA warfarin dose: 7.5mg  daily with last dose 3/10  Goal of Therapy:  INR 2-3 Monitor platelets by anticoagulation protocol: Yes   Plan:  Continue Lovenox 85 mg/kg BID Daily INR Warfarin 10 mg x 1 again tonight Continue to monitor H&H and platelets  Re-educate pt on warfarin  Uvaldo Rising, BCPS  Clinical Pharmacist Pager [phone]  09/25/2014 11:03 AM

## 2014-09-25 NOTE — Progress Notes (Signed)
[facility] PROGRESS NOTE  Matthew [name] HQI:[id number] DOB: 1976/05/08 DOA: 09/23/2014 PCP: Glo Herring., MD  Assessment/Plan: 1. Aflutter with RVR -rate controlled -continue PO diltiazem, metoprolol -continue  Warfarin, INR subtherapeutic   2. ETOH withdrawal -continue IVF, Ativan per CIWA protocol -if worsens will transfer to ICU, required precedex last admission, stable at this time -continue folic acid and thiamine -FU mag  3. Depression -continue seroquel  DVt proph: on warfarin  Code Status: Full COde Family Communication: none at bedside Disposition Plan: transfer to SDU if worsens   HPI/Subjective: Some anxiety, otherwise ok  Objective: Filed Vitals:   09/25/14 0453  BP: 97/64  Pulse: 86  Temp: 98 F (36.7 C)  Resp: 16    Intake/Output Summary (Last 24 hours) at 09/25/14 0807 Last data filed at 09/24/14 2157  Gross per 24 hour  Intake    243 ml  Output      0 ml  Net    243 ml   Filed Weights   09/23/14 1900 09/23/14 2054  Weight: 83 kg (182 lb 15.7 oz) 83.598 kg (184 lb 4.8 oz)    Exam:   General:  AAOx3, slightly restless, no asterixes  Cardiovascular: X3K4/MWN, systolic murmur  Respiratory: CTAB  Abdomen: soft, NT, BS present  Musculoskeletal: no edema c/c  Neuro: non focal  Data Reviewed: Basic Metabolic Panel:  Recent Labs Lab 09/23/14 1606 09/24/14 0350  NA 141 138  K 4.2 3.8  CL 103 100  CO2 13* 29  GLUCOSE 85 104*  BUN 10 8  CREATININE 1.09 0.81  CALCIUM 8.4 8.9   Liver Function Tests: No results for input(s): AST, ALT, ALKPHOS, BILITOT, PROT, ALBUMIN in the last 168 hours. No results for input(s): LIPASE, AMYLASE in the last 168 hours. No results for input(s): AMMONIA in the last 168 hours. CBC:  Recent Labs Lab 09/23/14 1606 09/24/14 0350  WBC 8.9 7.3  NEUTROABS 7.6  --   HGB 15.2 14.0  HCT 44.5 40.6  MCV 100.9* 100.5*  PLT 277 223   Cardiac Enzymes:  Recent Labs Lab  09/23/14 1606  TROPONINI 0.04*   BNP (last 3 results)  Recent Labs  09/23/14 1606  BNP 164.8*    ProBNP (last 3 results) No results for input(s): PROBNP in the last 8760 hours.  CBG: No results for input(s): GLUCAP in the last 168 hours.  Recent Results (from the past 240 hour(s))  MRSA PCR Screening     Status: None   Collection Time: 09/24/14 12:30 AM  Result Value Ref Range Status   MRSA by PCR NEGATIVE NEGATIVE Final    Comment:        The GeneXpert MRSA Assay (FDA approved for NASAL specimens only), is one component of a comprehensive MRSA colonization surveillance program. It is not intended to diagnose MRSA infection nor to guide or monitor treatment for MRSA infections.      Studies: Dg Chest 2 View  09/23/2014   CLINICAL DATA:  Tachycardia.  Weakness.  Chest pain.  EXAM: CHEST  2 VIEW  COMPARISON:  08/02/2014  FINDINGS: Thin sternotomy wires noted. The stable appearance of double contour along the aortic arch. Mild upper zone pulmonary vascular prominence. No cardiomegaly. No overt airway thickening.  No pleural effusion.  The lungs appear otherwise clear.  IMPRESSION: 1. Mild upper zone pulmonary vascular prominence could reflect pulmonary venous hypertension. No overt cardiomegaly. Prior pleural effusion and prior left basilar airspace opacity have resolved.   Electronically Signed  By: Van Clines M.D.   On: 09/23/2014 16:44    Scheduled Meds: . antiseptic oral rinse  7 mL Mouth Rinse BID  . busPIRone  15 mg Oral BID  . diltiazem  120 mg Oral BID  . enoxaparin (LOVENOX) injection  1 mg/kg Subcutaneous Q12H  . FLUoxetine  20 mg Oral Daily  . folic acid  1 mg Oral Daily  . LORazepam  0-4 mg Intravenous Q6H   Followed by  . [START ON 09/26/2014] LORazepam  0-4 mg Intravenous Q12H  . metoprolol  50 mg Oral BID  . multivitamin with minerals  1 tablet Oral Daily  . pantoprazole  40 mg Oral Daily  . QUEtiapine  50 mg Oral QHS  . sodium chloride  3 mL  Intravenous Q12H  . thiamine  100 mg Oral Daily  . Warfarin - Pharmacist Dosing Inpatient   Does not apply q1800   Continuous Infusions: . sodium chloride 100 mL/hr at 09/24/14 2306   Antibiotics Given (last 72 hours)    None      Principal Problem:   Atrial flutter with rapid ventricular response Active Problems:   HYPERTENSION, BENIGN   Atrial flutter   Alcohol abuse   Alcohol withdrawal   Metabolic acidosis   Chronic systolic CHF (congestive heart failure)    Time spent: 58min    [place] [facility] Pager [phone]. If 7PM-7AM, please contact night-coverage at [email or web address], password [facility] 09/25/2014, 8:07 AM  LOS: 2 days

## 2014-09-25 NOTE — Progress Notes (Signed)
M.D paged. New orders given to transfer pt to [facility] . Report given to receiving nurse on 2C. Pt transferred to [facility] via wheelchair with RN and NT.

## 2014-09-26 DIAGNOSIS — F10231 Alcohol dependence with withdrawal delirium: Principal | ICD-10-CM

## 2014-09-26 LAB — COMPREHENSIVE METABOLIC PANEL
ALT: 41 U/L (ref 0–53)
AST: 71 U/L — ABNORMAL HIGH (ref 0–37)
Albumin: 3.2 g/dL — ABNORMAL LOW (ref 3.5–5.2)
Alkaline Phosphatase: 48 U/L (ref 39–117)
Anion gap: 6 (ref 5–15)
BUN: 7 mg/dL (ref 6–23)
CO2: 28 mmol/L (ref 19–32)
Calcium: 8.8 mg/dL (ref 8.4–10.5)
Chloride: 107 mmol/L (ref 96–112)
Creatinine, Ser: 0.63 mg/dL (ref 0.50–1.35)
GFR calc Af Amer: >90 mL/min (ref >90)
GFR calc non Af Amer: >90 mL/min (ref >90)
Glucose, Bld: 93 mg/dL (ref 70–99)
Potassium: 3.8 mmol/L (ref 3.5–5.1)
Sodium: 141 mmol/L (ref 135–145)
Total Bilirubin: 0.7 mg/dL (ref 0.3–1.2)
Total Protein: 5.7 g/dL — ABNORMAL LOW (ref 6.0–8.3)

## 2014-09-26 LAB — PROTIME-INR
INR: 1.7 — ABNORMAL HIGH (ref 0.00–1.49)
Prothrombin Time: 20.2 seconds — ABNORMAL HIGH (ref 11.6–15.2)

## 2014-09-26 MED ORDER — LORAZEPAM 2 MG/ML IJ SOLN
2.0000 mg | INTRAMUSCULAR | Status: DC | PRN
Start: 2014-09-26 — End: 2014-09-29
  Administered 2014-09-27 – 2014-09-28 (×4): 2 mg via INTRAVENOUS
  Filled 2014-09-26 (×4): qty 1

## 2014-09-26 MED ORDER — MAGNESIUM SULFATE 2 GM/50ML IV SOLN
2.0000 g | Freq: Once | INTRAVENOUS | Status: AC
Start: 2014-09-26 — End: 2014-09-26
  Administered 2014-09-26: 2 g via INTRAVENOUS
  Filled 2014-09-26: qty 50

## 2014-09-26 MED ORDER — WARFARIN SODIUM 7.5 MG PO TABS
7.5000 mg | ORAL_TABLET | Freq: Every day | ORAL | Status: DC
  Administered 2014-09-26: 7.5 mg via ORAL
  Filled 2014-09-26 (×2): qty 1

## 2014-09-26 MED ORDER — ENOXAPARIN SODIUM 100 MG/ML ~~LOC~~ SOLN
1.0000 mg/kg | Freq: Two times a day (BID) | SUBCUTANEOUS | Status: DC
  Administered 2014-09-27 – 2014-09-29 (×4): 85 mg via SUBCUTANEOUS
  Filled 2014-09-26 (×7): qty 1

## 2014-09-26 NOTE — Clinical Social Work Psychosocial (Signed)
CSW received call from patients mother- she left voicemail requesting help with finding rehab placement.  CSW spoke with RN who confirmed that pt is not responsive at this time.  CSW spoke with pt mom who stated that they had set up a rehab facility at [facility] in [place]- contact person Myles Gip [phone] (fax: [phone]) where pt would go to inpatient rehab if he is agreeable when he becomes more oriented.  CSW spoke with Dr who states patient will need to be here for a few more days.  CSW will continue to follow.  Domenica Reamer, [place] Social Worker [phone]

## 2014-09-26 NOTE — Progress Notes (Signed)
[facility] PROGRESS NOTE  TERELLE DOBLER OQH:[id number] DOB: 01-21-1976 DOA: 09/23/2014 PCP: Glo Herring., MD  Assessment/Plan: 1. Aflutter with RVR -in NSR, rate up due to ETOH withdrawal -continue PO diltiazem, metoprolol -continue  Warfarin/lovenox, INR subtherapeutic   2. ETOH withdrawal/delirium tremens -continue IVF, Ativan per CIWA stepdown protocol -continue folic acid and thiamine -replace mag -if worsens will need Precedex gtt  3. Depression -continue seroquel  DVt proph: on warfarin/lovenox  Code Status: Full COde Family Communication: none at bedside Disposition Plan: transfer to ICU if worsens   HPI/Subjective: Overnight with worsens DTs in 20s  Objective: Filed Vitals:   09/26/14 1145  BP: 102/81  Pulse: 112  Temp: 98 F (36.7 C)  Resp: 22    Intake/Output Summary (Last 24 hours) at 09/26/14 1224 Last data filed at 09/25/14 2352  Gross per 24 hour  Intake      3 ml  Output    700 ml  Net   -697 ml   Filed Weights   09/23/14 1900 09/23/14 2054 09/25/14 2306  Weight: 83 kg (182 lb 15.7 oz) 83.598 kg (184 lb 4.8 oz) 90.6 kg (199 lb 11.8 oz)    Exam:   General:  Sleeping, transiently wakens and dozes off to sleep, drowsy doesn't answer my questions  Cardiovascular: T4S5/KCL, systolic murmur  Respiratory: CTAB  Abdomen: soft, NT, BS present  Musculoskeletal: no edema c/c  Neuro: non focal  Data Reviewed: Basic Metabolic Panel:  Recent Labs Lab 09/23/14 1606 09/24/14 0350 09/25/14 0947 09/26/14 0417  NA 141 138  --  141  K 4.2 3.8  --  3.8  CL 103 100  --  107  CO2 13* 29  --  28  GLUCOSE 85 104*  --  93  BUN 10 8  --  7  CREATININE 1.09 0.81  --  0.63  CALCIUM 8.4 8.9  --  8.8  MG  --   --  1.4*  --    Liver Function Tests:  Recent Labs Lab 09/26/14 0417  AST 71*  ALT 41  ALKPHOS 48  BILITOT 0.7  PROT 5.7*  ALBUMIN 3.2*   No results for input(s): LIPASE, AMYLASE in the last 168 hours. No  results for input(s): AMMONIA in the last 168 hours. CBC:  Recent Labs Lab 09/23/14 1606 09/24/14 0350  WBC 8.9 7.3  NEUTROABS 7.6  --   HGB 15.2 14.0  HCT 44.5 40.6  MCV 100.9* 100.5*  PLT 277 223   Cardiac Enzymes:  Recent Labs Lab 09/23/14 1606  TROPONINI 0.04*   BNP (last 3 results)  Recent Labs  09/23/14 1606  BNP 164.8*    ProBNP (last 3 results) No results for input(s): PROBNP in the last 8760 hours.  CBG: No results for input(s): GLUCAP in the last 168 hours.  Recent Results (from the past 240 hour(s))  MRSA PCR Screening     Status: None   Collection Time: 09/24/14 12:30 AM  Result Value Ref Range Status   MRSA by PCR NEGATIVE NEGATIVE Final    Comment:        The GeneXpert MRSA Assay (FDA approved for NASAL specimens only), is one component of a comprehensive MRSA colonization surveillance program. It is not intended to diagnose MRSA infection nor to guide or monitor treatment for MRSA infections.      Studies: No results found.  Scheduled Meds: . antiseptic oral rinse  7 mL Mouth Rinse BID  . busPIRone  15 mg Oral  BID  . diltiazem  120 mg Oral BID  . enoxaparin (LOVENOX) injection  1 mg/kg Subcutaneous Q12H  . FLUoxetine  20 mg Oral Daily  . folic acid  1 mg Oral Daily  . metoprolol  50 mg Oral BID  . multivitamin with minerals  1 tablet Oral Daily  . pantoprazole  40 mg Oral Daily  . QUEtiapine  50 mg Oral QHS  . sodium chloride  3 mL Intravenous Q12H  . thiamine  100 mg Oral Daily  . Warfarin - Pharmacist Dosing Inpatient   Does not apply q1800   Continuous Infusions: . sodium chloride 1,000 mL (09/26/14 0550)   Antibiotics Given (last 72 hours)    None      Principal Problem:   Atrial flutter with rapid ventricular response Active Problems:   HYPERTENSION, BENIGN   Atrial flutter   Alcohol abuse   Alcohol withdrawal   Metabolic acidosis   Chronic systolic CHF (congestive heart failure)    Time spent:  48min    Zabrina Brotherton  [facility] Pager [phone]. If 7PM-7AM, please contact night-coverage at [email or web address], password [facility] 09/26/2014, 12:24 PM  LOS: 3 days

## 2014-09-26 NOTE — Progress Notes (Signed)
Pt has been sleeping for the past 4 hours. VS obtained and remain stable. When attempt to waken, pt  becomes aggitated and combative. Left alone he falls right back to sleep. Confused. Oriented to person only.

## 2014-09-26 NOTE — Progress Notes (Signed)
In room for CIWA check. Pt asleep.  B/p 110/86 hr 113 02 sat 93%

## 2014-09-26 NOTE — Progress Notes (Signed)
ANTICOAGULATION CONSULT NOTE - [place] for Lovenox, Coumadin Indication: atrial fibrillation  Allergies  Allergen Reactions  . Aspirin Other (See Comments)    Unknown    Patient Measurements: Height: 6\' 3"  (190.5 cm) Weight: 199 lb 11.8 oz (90.6 kg) IBW/kg (Calculated) : 84.5  Vital Signs: Temp: 98 F (36.7 C) (03/14 1145) Temp Source: Axillary (03/14 1145) BP: 102/81 mmHg (03/14 1145) Pulse Rate: 112 (03/14 1145)  Labs:  Recent Labs  09/23/14 1606 09/24/14 0350 09/24/14 1208 09/25/14 0506 09/26/14 0417  HGB 15.2 14.0  --   --   --   HCT 44.5 40.6  --   --   --   PLT 277 223  --   --   --   LABPROT 13.8  --  13.8 15.7* 20.2*  INR 1.04  --  1.05 1.24 1.70*  CREATININE 1.09 0.81  --   --  0.63  TROPONINI 0.04*  --   --   --   --     Estimated Creatinine Clearance: 152.6 mL/min (by C-G formula based on Cr of 0.63).   Medical History: Past Medical History  Diagnosis Date  . Complete transposition of great vessels   . Hypertension   . Dyslipidemia   . Depression   . Alcohol abuse   . 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 SVT (supraventricular tachycardia) "several times in the last year" (09/24/2014)  . Heart murmur   . Anxiety   . GERD (gastroesophageal reflux disease)   . Headache     "weekly" (09/24/2014)    Medications:  Prescriptions prior to admission  Medication Sig Dispense Refill Last Dose  . busPIRone (BUSPAR) 15 MG tablet Take 15 mg by mouth 2 (two) times daily.   Past Week at Unknown time  . diltiazem (CARDIZEM SR) 120 MG 12 hr capsule Take 1 capsule (120 mg total) by mouth 2 (two) times daily. 60 capsule 1 09/23/2014 at Unknown time  . FLUoxetine (PROZAC) 20 MG capsule Take 20 mg by mouth daily.   Past Week at Unknown time  . folic acid (FOLVITE) 1 MG tablet Take 1 tablet (1 mg total) by mouth daily.   09/23/2014 at Unknown time  . LORazepam (ATIVAN) 1 MG tablet 1mg  every 8hrs for 1d, 1mg  every 12hrs for 2 days,  0.5mg  every 12hrs for 2 days, then 0.5mg  daily for 1 day 30 tablet 0 Past Month at Unknown time  . metoprolol (LOPRESSOR) 100 MG tablet Take 0.5 tablets (50 mg total) by mouth 2 (two) times daily. 60 tablet 1 09/23/2014 at 1000  . Multiple Vitamin (MULTIVITAMIN WITH MINERALS) TABS tablet Take 1 tablet by mouth daily.   09/23/2014 at Unknown time  . pantoprazole (PROTONIX) 40 MG tablet Take 40 mg by mouth daily.   09/23/2014 at Unknown time  . QUEtiapine (SEROQUEL XR) 50 MG TB24 24 hr tablet Take 1 tablet (50 mg total) by mouth at bedtime. 30 each 0 Past Month at Unknown time  . thiamine 100 MG tablet Take 1 tablet (100 mg total) by mouth daily. 30 tablet 11 09/23/2014 at Unknown time  . warfarin (COUMADIN) 7.5 MG tablet Take 1 tablet (7.5 mg total) by mouth daily at 6 PM. 30 tablet 1 09/22/2014 at Unknown time  . feeding supplement, ENSURE COMPLETE, (ENSURE COMPLETE) LIQD Take 237 mLs by mouth 2 (two) times daily between meals. (Patient not taking: Reported on 07/28/2014)   Not Taking at Unknown time    Assessment: 38yo  male on coumadin for h/o afib. Today's INR up to 1.7 - nice upward trend. Continues on Lovenox bridge. CBC stable. No bleeding noted. PTA warfarin dose: 7.5mg  daily with last dose 3/10  Goal of Therapy:  INR 2-3 Monitor platelets by anticoagulation protocol: Yes   Plan:  Lovenox 85 mg SQBID Daily INR Warfarin 7.5mg  daily Continue to monitor H&H and platelets   Sherlon Handing, PharmD, BCPS Clinical pharmacist, pager [phone]  09/26/2014 2:08 PM

## 2014-09-26 NOTE — Progress Notes (Signed)
In room to give meds,  meds scanned but would not take.

## 2014-09-26 NOTE — Progress Notes (Signed)
Shift Event: Pt with continued agitation, delirium, pulled IV access out, and CIWA in 20s despite IV ativan. Pt transferred to SDU for closer monitoring on SDU CIWA.   [place] [facility] [phone]

## 2014-09-26 NOTE — Progress Notes (Signed)
Pt awake and alert and oriented and all daily meds given at his time. Pharmacy notiified

## 2014-09-26 NOTE — Progress Notes (Signed)
In room to check CIWA. Pt sleeping. B/p 107/78, hr 89

## 2014-09-27 DIAGNOSIS — F10239 Alcohol dependence with withdrawal, unspecified: Secondary | ICD-10-CM

## 2014-09-27 LAB — BASIC METABOLIC PANEL
Anion gap: 8 (ref 5–15)
BUN: 5 mg/dL — ABNORMAL LOW (ref 6–23)
CO2: 28 mmol/L (ref 19–32)
Calcium: 8.9 mg/dL (ref 8.4–10.5)
Chloride: 103 mmol/L (ref 96–112)
Creatinine, Ser: 0.75 mg/dL (ref 0.50–1.35)
GFR calc Af Amer: >90 mL/min (ref >90)
GFR calc non Af Amer: >90 mL/min (ref >90)
Glucose, Bld: 154 mg/dL — ABNORMAL HIGH (ref 70–99)
Potassium: 3.4 mmol/L — ABNORMAL LOW (ref 3.5–5.1)
Sodium: 139 mmol/L (ref 135–145)

## 2014-09-27 LAB — CBC
HCT: 40.1 % (ref 39.0–52.0)
Hemoglobin: 13.2 g/dL (ref 13.0–17.0)
MCH: 33.9 pg (ref 26.0–34.0)
MCHC: 32.9 g/dL (ref 30.0–36.0)
MCV: 103.1 fL — ABNORMAL HIGH (ref 78.0–100.0)
Platelets: 158 10*3/uL (ref 150–400)
RBC: 3.89 MIL/uL — ABNORMAL LOW (ref 4.22–5.81)
RDW: 15.2 % (ref 11.5–15.5)
WBC: 4.6 10*3/uL (ref 4.0–10.5)

## 2014-09-27 LAB — PROTIME-INR
INR: 1.79 — ABNORMAL HIGH (ref 0.00–1.49)
Prothrombin Time: 21 seconds — ABNORMAL HIGH (ref 11.6–15.2)

## 2014-09-27 MED ORDER — WARFARIN SODIUM 10 MG PO TABS
10.0000 mg | ORAL_TABLET | Freq: Once | ORAL | Status: AC
  Administered 2014-09-27: 10 mg via ORAL
  Filled 2014-09-27: qty 1

## 2014-09-27 NOTE — Consult Note (Signed)
[facility] Consult   Reason for Consult:  Capacity evaluation and alcohol abuse with withdrawal Referring Physician:  Dr. Broadus John Patient Identification: Matthew [name] MRN:  [id number] Principal Diagnosis: Alcohol withdrawal Diagnosis:   Patient Active Problem List   Diagnosis Date Noted  . Atrial flutter with rapid ventricular response [I48.92] 09/23/2014  . Chronic systolic congestive heart failure [I50.22]   . Tachycardia [R00.0] 07/28/2014  . SVT (supraventricular tachycardia) [I47.1]   . Hypocalcemia [E83.51]   . Transaminitis [R74.0]   . A-fib [I48.91] 04/21/2014  . Encounter for therapeutic drug monitoring [Z51.81] 01/07/2014  . Hallucination [R44.3] 01/04/2014  . 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 Chronic systolic CHF (congestive heart failure) [I50.22] 01/01/2014  . Pulmonary hypertension [I27.0] 12/31/2013  . Metabolic acidosis [W43.1] 12/30/2013  . Protein-calorie malnutrition, severe [E43] 12/30/2013  . Nausea vomiting and diarrhea [R11.2, R19.7] 12/29/2013  . Atrial flutter [I48.92] 12/29/2013  . Hypokalemia [E87.6] 12/29/2013  . Hypomagnesemia [E83.42] 12/29/2013  . Alcohol abuse [F10.10] 12/29/2013  . Alcohol withdrawal [F10.239] 12/29/2013  . Dehydration with hyponatremia [E87.1] 12/29/2013  . Depression [F32.9] 12/29/2013  . Hyperglycemia [R73.9] 12/29/2013  . Melena [K92.1] 12/29/2013  . Elevated transaminase level [R74.0] 12/29/2013  . HYPERLIPIDEMIA-MIXED [E78.5] 04/21/2009  . HYPERTENSION, BENIGN [I10] 04/21/2009  . OTHER DISORDERS PAPILLARY MUSCLE [M62.9] 01/24/2009  . 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 male patient admitted with alcohol abuse with withdrawal.  HPI:  Matthew [name] is a 39 y.o. male seen, chart reviewed for psychiatric consultation and evaluation of alcohol abuse versus  dependence and depression along with anxiety. Patient reported he has been suffering with depression and anxiety over several years and also alcohol dependence. Patient reported he has previous alcohol detox treatments. Patient has been separated from his wife of 5 years who chose another man and left him 2 years ago. Patient has been alone, disabled secondary to multiple medical problems including congestive heart failure and atrial fibrillation. Patient has 3 children and a joint custody. Patient reported he was able to stay 45 days sober before he was relapsed drinking alcohol regularly. Patient reported his drinking one fifth of vodka daily until he was admitted for the last 1 week. Patient mother is supportive to him and hoping that he will be able to participate in rehabilitation for substance abuse in out of the [place] in [place]. Reportedly patient is known to change his mind when he was feeling better regarding rehabilitation treatment. Patient does not see psychiatry but has been receiving psychiatric medication management from family physician in [employer] [place].  Medical history: Patient with a history of Alcohol Abuse, Atrial Flutter and HTN who reports that he had abstained from alcohol until 2 weeks ago, and he began to drink one Fifth of alcohol every 2 days. He presented to the ED due to increased palpitations, and he reports that he was having shakes and sweats. His last drink was in the AM. He was evaluated in the ED and found to have Atrial Flutter with AVR, and he reports not taking his medications due to his drinking.He has UDS is positive for benzo's and cannabis  Review of Systems:  Constitutional: No Weight Loss, No Weight Gain, +Sweats, Fevers, Chills, Dizziness, Light Headedness, Fatigue, or Generalized Weakness HEENT: No Headaches, Difficulty Swallowing,Tooth/Dental Problems,Sore Throat,  No Sneezing, Rhinitis, Ear Ache, Nasal Congestion,  or Post Nasal Drip,  Cardio-vascular: No Chest pain, Orthopnea, PND, Edema in Lower Extremities, Anasarca, Dizziness, +Palpitations  Resp: No Dyspnea, No DOE, No Productive Cough, No Non-Productive Cough, No Hemoptysis, No Wheezing.  GI: No Heartburn, Indigestion, Abdominal Pain, Nausea, Vomiting, Diarrhea, Constipation, Hematemesis, Hematochezia, Melena, Change in Bowel Habits, Loss of Appetite  GU: No Dysuria, No Change in Color of Urine, No Urgency or Urinary Frequency, No Flank pain.  Musculoskeletal: No Joint Pain or Swelling, No Decreased Range of Motion, No Back Pain.  Neurologic: No Syncope, No Seizures, Muscle Weakness, Paresthesia, Vision Disturbance or Loss, No Diplopia, No Vertigo, No Difficulty Walking,  Skin: No Rash or Lesions. Psych: No Change in Mood or Affect, No Depression or Anxiety, No Memory loss, No Confusion, or Hallucinations  HPI Elements:   Location:  Alcohol dependence, depression and anxiety. Quality:  Poor. Severity:  Alcohol relapse and withdrawal symptoms. Timing:  Separation and divorce process. Duration:  Few months. Context:  Psychosocial stressors.  Past Medical History:  Past Medical History  Diagnosis Date  . Complete transposition of great vessels   . Hypertension   . Dyslipidemia   . Depression   . Alcohol abuse   . 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 SVT (supraventricular tachycardia) "several times in the last year" (09/24/2014)  . Heart murmur   . Anxiety   . GERD (gastroesophageal reflux disease)   . Headache     "weekly" (09/24/2014)    Past Surgical History  Procedure Laterality Date  . Finger arthroplasty Right     volar plate arthroplasty, right small finger proxima linterphalangeal joint [Other]  . Septostomy      Rashkind balloon atrial septostomy   . Transposition of great vessels repair  1980    "mustard procedure"  . Fracture surgery     Family History:  Family History  Problem Relation Age of Onset  .  Rheumatic fever Father   . Hypertension Father    Social History:  History  Alcohol Use  . 36.0 oz/week  . 27 Shots of liquor per week    Comment: 09/24/2014 "1/5th vodka in 2 days; easy"     History  Drug Use No    History   Social History  . Marital Status: Legally Separated    Spouse Name: N/A  . Number of Children: N/A  . Years of Education: N/A   Social History Main Topics  . Smoking status: Never Smoker   . Smokeless tobacco: Former Systems developer    Types: Snuff     Comment: "dipped when I played softball; none since 2011"  . Alcohol Use: 36.0 oz/week    60 Shots of liquor per week     Comment: 09/24/2014 "1/5th vodka in 2 days; easy"  . Drug Use: No  . Sexual Activity: Yes   Other Topics Concern  . None   Social History Narrative   Additional Social History:                          Allergies:   Allergies  Allergen Reactions  . Aspirin Other (See Comments)    Unknown    Vitals: Blood pressure 97/67, pulse 113, temperature 97.6 F (36.4 C), temperature source Oral, resp. rate 26, height 6\' 3"  (1.905 m), weight 90.6 kg (199 lb 11.8 oz), SpO2 99 %.  Risk to Self: Is patient at risk for suicide?: No Risk to Others:   Prior Inpatient Therapy:   Prior Outpatient  Therapy:    Current Facility-Administered Medications  Medication Dose Route Frequency Provider Last Rate Last Dose  . 0.9 %  sodium chloride infusion   Intravenous Continuous Theressa Millard, MD 100 mL/hr at 09/26/14 2000    . acetaminophen (TYLENOL) tablet 650 mg  650 mg Oral Q6H PRN Theressa Millard, MD   650 mg at 09/24/14 1040   Or  . acetaminophen (TYLENOL) suppository 650 mg  650 mg Rectal Q6H PRN Theressa Millard, MD      . alum & mag hydroxide-simeth (MAALOX/MYLANTA) 200-200-20 MG/5ML suspension 30 mL  30 mL Oral Q6H PRN Theressa Millard, MD   30 mL at 09/23/14 2248  . antiseptic oral rinse (CPC / CETYLPYRIDINIUM CHLORIDE 0.05%) solution 7 mL  7 mL Mouth Rinse BID Theressa Millard, MD   7 mL at 09/27/14 0928  . busPIRone (BUSPAR) tablet 15 mg  15 mg Oral BID Theressa Millard, MD   15 mg at 09/27/14 0929  . diltiazem (CARDIZEM SR) 12 hr capsule 120 mg  120 mg Oral BID Theressa Millard, MD   120 mg at 09/26/14 1807  . enoxaparin (LOVENOX) injection 85 mg  1 mg/kg Subcutaneous Q12H Domenic Polite, MD      . FLUoxetine (PROZAC) capsule 20 mg  20 mg Oral Daily Theressa Millard, MD   20 mg at 09/27/14 1610  . folic acid (FOLVITE) tablet 1 mg  1 mg Oral Daily Theressa Millard, MD   1 mg at 09/27/14 9604  . LORazepam (ATIVAN) injection 2-3 mg  2-3 mg Intravenous Q1H PRN Domenic Polite, MD   2 mg at 09/27/14 0317  . metoprolol (LOPRESSOR) tablet 50 mg  50 mg Oral BID Theressa Millard, MD   50 mg at 09/27/14 5409  . multivitamin with minerals tablet 1 tablet  1 tablet Oral Daily Theressa Millard, MD   1 tablet at 09/27/14 [phone]  . ondansetron (ZOFRAN) tablet 4 mg  4 mg Oral Q6H PRN Theressa Millard, MD       Or  . ondansetron (ZOFRAN) injection 4 mg  4 mg Intravenous Q6H PRN Theressa Millard, MD   4 mg at 09/24/14 0124  . oxyCODONE (Oxy IR/ROXICODONE) immediate release tablet 5 mg  5 mg Oral Q4H PRN Theressa Millard, MD   5 mg at 09/27/14 0317  . pantoprazole (PROTONIX) EC tablet 40 mg  40 mg Oral Daily Theressa Millard, MD   40 mg at 09/27/14 0935  . QUEtiapine (SEROQUEL XR) 24 hr tablet 50 mg  50 mg Oral QHS Theressa Millard, MD   50 mg at 09/27/14 0003  . sodium chloride 0.9 % injection 3 mL  3 mL Intravenous Q12H Theressa Millard, MD   3 mL at 09/27/14 0003  . thiamine (VITAMIN B-1) tablet 100 mg  100 mg Oral Daily Serita Grit, MD   100 mg at 09/27/14 1478  . warfarin (COUMADIN) tablet 7.5 mg  7.5 mg Oral q1800 Franky Macho, RPH   7.5 mg at 09/26/14 1804  . Warfarin - Pharmacist Dosing Inpatient   Does not apply q1800 Theressa Millard, MD        Musculoskeletal: Strength & Muscle Tone: decreased Gait & Station: unable to stand Patient  leans: N/A  Psychiatric Specialty Exam: Physical Exam as per history and physical   ROS handshakes, tremors, sweating, nausea and vomiting   Blood pressure 97/67, pulse 113, temperature 97.6 F (36.4  C), temperature source Oral, resp. rate 26, height 6\' 3"  (1.905 m), weight 90.6 kg (199 lb 11.8 oz), SpO2 99 %.Body mass index is 24.97 kg/(m^2).  General Appearance: Casual  Eye Contact::  Good  Speech:  Clear and Coherent  Volume:  Decreased  Mood:  Anxious and Depressed  Affect:  Appropriate and Congruent  Thought Process:  Coherent and Goal Directed  Orientation:  Full (Time, Place, and Person)  Thought Content:  Rumination  Suicidal Thoughts:  No  Homicidal Thoughts:  No  Memory:  Immediate;   Good Recent;   Good  Judgement:  Impaired  Insight:  Fair  Psychomotor Activity:  Decreased  Concentration:  Good  Recall:  Good  Fund of Knowledge:Good  Language: Good  Akathisia:  Negative  Handed:  Right  AIMS (if indicated):     Assets:  Communication Skills Desire for Improvement Housing Leisure Time Resilience Social Support Transportation  ADL's:  Impaired  Cognition: WNL  Sleep:      Medical Decision Making: New problem, with additional work up planned, Review of Psycho-Social Stressors (1), Review or order clinical lab tests (1), Established Problem, Worsening (2), Review or order medicine tests (1), Review of Medication Regimen & Side Effects (2) and Review of New Medication or Change in Dosage (2)  Treatment Plan Summary: Daily contact with patient to assess and evaluate symptoms and progress in treatment and Medication management  Plan:  Continue alcohol detox treatment and supportive treatment  Continue psychiatric medication as ordered  Patient does not meet criteria for psychiatric inpatient admission. Supportive therapy provided about ongoing stressors. Appreciate psychiatric consultation and follow up as clinically required Please contact [phone] or [phone] if needs further assistance  Disposition: Patient benefit from long-term residential chemical dependency rehabilitation treatment when medically stable.   Luvina Poirier,JANARDHAHA R. 09/27/2014 10:38 AM

## 2014-09-27 NOTE — Progress Notes (Signed)
ANTICOAGULATION CONSULT NOTE - [place] for Lovenox, Coumadin Indication: atrial fibrillation  Allergies  Allergen Reactions  . Aspirin Other (See Comments)    Unknown    Patient Measurements: Height: 6\' 3"  (190.5 cm) Weight: 199 lb 11.8 oz (90.6 kg) IBW/kg (Calculated) : 84.5  Vital Signs: Temp: 97.7 F (36.5 C) (03/15 1213) Temp Source: Oral (03/15 1213) BP: 99/72 mmHg (03/15 1213) Pulse Rate: 112 (03/15 1213)  Labs:  Recent Labs  09/25/14 0506 09/26/14 0417 09/27/14 0340  HGB  --   --  13.2  HCT  --   --  40.1  PLT  --   --  158  LABPROT 15.7* 20.2* 21.0*  INR 1.24 1.70* 1.79*  CREATININE  --  0.63 0.75    Estimated Creatinine Clearance: 152.6 mL/min (by C-G formula based on Cr of 0.75).   Assessment: 39yo male on coumadin for h/o afib. Today's INR up to 1.79 - trending up. Continues on Lovenox bridge. No bleeding noted. CBC stable.  PTA warfarin dose: 7.5mg  daily with last dose 3/10   Goal of Therapy:  INR 2-3 Monitor platelets by anticoagulation protocol: Yes   Plan:  Lovenox 85 mg SQBID Daily INR Warfarin 10mg  tonight Continue to monitor H&H and platelets   Sherlon Handing, PharmD, BCPS Clinical pharmacist, pager [phone]  09/27/2014 2:55 PM

## 2014-09-27 NOTE — Progress Notes (Addendum)
[facility] PROGRESS NOTE  Matthew [name] FMB:[id number] DOB: [birth year] DOA: 09/23/2014 PCP: Glo Herring., MD  Brief Narrative: Matthew [name] is a 39 y.o. male with a history of Alcohol Abuse, Atrial Flutter and HTN who reports that he had abstained from alcohol until 2 weeks ago, and then he began to drink one Fifth of alcohol daily. He presented to the ED due to increased palpitations, shakes and sweats.  He was evaluated in the ED and found to have Atrial Flutter with AVR and ETOH withdrawal. Subsequently had worsening withdrawals requiring SDU transfer, now improving  Assessment/Plan: 1. Aflutter with RVR -in NSR, rate up due to ETOH withdrawal -continue PO diltiazem, metoprolol -continue  Warfarin/lovenox, INR subtherapeutic   2. ETOH withdrawal/delirium tremens -improving, more lucid since last pm -continue IVF, Ativan per [facility]WA stepdown protocol -continue folic acid and thiamine -replaced mag -will consult psych and CSW for resources for Substance abuse  3. Depression -continue seroquel  DVt proph: on warfarin/lovenox  Code Status: Full COde Family Communication: none at bedside Disposition Plan: transfer out of stepdown if remains stable   HPI/Subjective: Mentation improved since yesterday evening  Objective: Filed Vitals:   09/27/14 0427  BP:   Pulse:   Temp: 97.8 F (36.6 C)  Resp:     Intake/Output Summary (Last 24 hours) at 09/27/14 0741 Last data filed at 09/27/14 0430  Gross per 24 hour  Intake   2100 ml  Output    650 ml  Net   1450 ml   Filed Weights   09/23/14 1900 09/23/14 2054 09/25/14 2306  Weight: 83 kg (182 lb 15.7 oz) 83.598 kg (184 lb 4.8 oz) 90.6 kg (199 lb 11.8 oz)    Exam:   General:  Alert, awake, drowsy but answers questions appropriately  Cardiovascular: T0V7/BLT, systolic murmur  Respiratory: CTAB  Abdomen: soft, NT, BS present  Musculoskeletal: no edema c/c  Neuro: non focal  Data  Reviewed: Basic Metabolic Panel:  Recent Labs Lab 09/23/14 1606 09/24/14 0350 09/25/14 0947 09/26/14 0417 09/27/14 0340  NA 141 138  --  141 139  K 4.2 3.8  --  3.8 3.4*  CL 103 100  --  107 103  CO2 13* 29  --  28 28  GLUCOSE 85 104*  --  93 154*  BUN 10 8  --  7 5*  CREATININE 1.09 0.81  --  0.63 0.75  CALCIUM 8.4 8.9  --  8.8 8.9  MG  --   --  1.4*  --   --    Liver Function Tests:  Recent Labs Lab 09/26/14 0417  AST 71*  ALT 41  ALKPHOS 48  BILITOT 0.7  PROT 5.7*  ALBUMIN 3.2*   No results for input(s): LIPASE, AMYLASE in the last 168 hours. No results for input(s): AMMONIA in the last 168 hours. CBC:  Recent Labs Lab 09/23/14 1606 09/24/14 0350 09/27/14 0340  WBC 8.9 7.3 4.6  NEUTROABS 7.6  --   --   HGB 15.2 14.0 13.2  HCT 44.5 40.6 40.1  MCV 100.9* 100.5* 103.1*  PLT 277 223 158   Cardiac Enzymes:  Recent Labs Lab 09/23/14 1606  TROPONINI 0.04*   BNP (last 3 results)  Recent Labs  09/23/14 1606  BNP 164.8*    ProBNP (last 3 results) No results for input(s): PROBNP in the last 8760 hours.  CBG: No results for input(s): GLUCAP in the last 168 hours.  Recent Results (from the past 240 hour(s))  MRSA PCR Screening     Status: None   Collection Time: 09/24/14 12:30 AM  Result Value Ref Range Status   MRSA by PCR NEGATIVE NEGATIVE Final    Comment:        The GeneXpert MRSA Assay (FDA approved for NASAL specimens only), is one component of a comprehensive MRSA colonization surveillance program. It is not intended to diagnose MRSA infection nor to guide or monitor treatment for MRSA infections.      Studies: No results found.  Scheduled Meds: . antiseptic oral rinse  7 mL Mouth Rinse BID  . busPIRone  15 mg Oral BID  . diltiazem  120 mg Oral BID  . enoxaparin (LOVENOX) injection  1 mg/kg Subcutaneous Q12H  . FLUoxetine  20 mg Oral Daily  . folic acid  1 mg Oral Daily  . metoprolol  50 mg Oral BID  . multivitamin with  minerals  1 tablet Oral Daily  . pantoprazole  40 mg Oral Daily  . QUEtiapine  50 mg Oral QHS  . sodium chloride  3 mL Intravenous Q12H  . thiamine  100 mg Oral Daily  . warfarin  7.5 mg Oral q1800  . Warfarin - Pharmacist Dosing Inpatient   Does not apply q1800   Continuous Infusions: . sodium chloride 100 mL/hr at 09/26/14 2000   Antibiotics Given (last 72 hours)    None      Principal Problem:   Atrial flutter with rapid ventricular response Active Problems:   HYPERTENSION, BENIGN   Atrial flutter   Alcohol abuse   Alcohol withdrawal   Metabolic acidosis   Chronic systolic CHF (congestive heart failure)    Time spent: 62min    Jhordyn Hoopingarner  [facility] Pager [phone]. If 7PM-7AM, please contact night-coverage at [email or web address], password [facility] 09/27/2014, 7:41 AM  LOS: 4 days

## 2014-09-28 LAB — BASIC METABOLIC PANEL
Anion gap: 5 (ref 5–15)
BUN: 5 mg/dL — ABNORMAL LOW (ref 6–23)
CO2: 30 mmol/L (ref 19–32)
Calcium: 8.9 mg/dL (ref 8.4–10.5)
Chloride: 102 mmol/L (ref 96–112)
Creatinine, Ser: 0.72 mg/dL (ref 0.50–1.35)
GFR calc Af Amer: >90 mL/min (ref >90)
GFR calc non Af Amer: >90 mL/min (ref >90)
Glucose, Bld: 145 mg/dL — ABNORMAL HIGH (ref 70–99)
Potassium: 4 mmol/L (ref 3.5–5.1)
Sodium: 137 mmol/L (ref 135–145)

## 2014-09-28 LAB — PROTIME-INR
INR: 1.96 — ABNORMAL HIGH (ref 0.00–1.49)
Prothrombin Time: 22.5 seconds — ABNORMAL HIGH (ref 11.6–15.2)

## 2014-09-28 LAB — MAGNESIUM: Magnesium: 1.5 mg/dL (ref 1.5–2.5)

## 2014-09-28 MED ORDER — WARFARIN SODIUM 10 MG PO TABS
10.0000 mg | ORAL_TABLET | Freq: Once | ORAL | Status: AC
  Administered 2014-09-28: 10 mg via ORAL
  Filled 2014-09-28: qty 1

## 2014-09-28 MED ORDER — METOPROLOL TARTRATE 50 MG PO TABS
75.0000 mg | ORAL_TABLET | Freq: Two times a day (BID) | ORAL | Status: DC
  Administered 2014-09-28 – 2014-09-29 (×3): 75 mg via ORAL
  Filled 2014-09-28 (×4): qty 1

## 2014-09-28 NOTE — Progress Notes (Signed)
[facility] PROGRESS NOTE  Matthew [name] EVO:[id number] DOB: 1976/04/22 DOA: 09/23/2014 PCP: Glo Herring., MD Interim summary: 39 year old male with h/o alcohol abuse, atrial flutter came in for ETOH withdrawal and atrial flutter. Assessment/Plan: 1. Atrial flutter: Came in rapid rate probably secondary to non compliance to med's and alcohol withdrawal.  Rater is much better today and in sinus. He is on Cardizem and metoprolol. His rate is at 110/min today. Increase the metoprolol to 75 mg BID. Continue with Lovenox and coumadin. INR still sub therapeutic.    2. ETOH withdrawal with delirium: He is alert and oriented, no hallucination or delirium this am.  Resume IVF and folic acid, thiamine.  Social worker consulted for chemical dependence rehab placement. Psychiatry consulted and recommendations given.     3. Depression: Further management as per psychiatry. Currently on Seroquel . He denies any suicidal ideations.   4. Hypokalemia and hypomagnesemia: Replaced as needed. Repeat levels to be checked today.     DVT prophylaxis.   Code Status: full code.  Family Communication: none at bedside Disposition Plan: possibly to rehab in 1 to 2 days when his rate is better controlled.    Consultants:  Psychiatry.  Procedures:  none  Antibiotics:  none  HPI/Subjective: Denies any new complaitns.  He reports he was nauseated yesterday and he feels much better.  He also said he would like to go to a alcohol dependence rehabilitation.   Objective: Filed Vitals:   09/28/14 0356  BP: 109/83  Pulse: 112  Temp: 97.7 F (36.5 C)  Resp: 24    Intake/Output Summary (Last 24 hours) at 09/28/14 0914 Last data filed at 09/27/14 1800  Gross per 24 hour  Intake    720 ml  Output   1000 ml  Net   -280 ml   Filed Weights   09/23/14 1900 09/23/14 2054 09/25/14 2306  Weight: 83 kg (182 lb 15.7 oz) 83.598 kg (184 lb 4.8 oz) 90.6 kg (199 lb 11.8 oz)     Exam:   General:  Alert afebrile comfortable  Cardiovascular: s1s2, tachycardia  Respiratory: clear to auscultation, no wheezing or rhonchi  Abdomen: soft non tender non distended bowel sounds heard  Musculoskeletal:  No pedal edema.   Data Reviewed: Basic Metabolic Panel:  Recent Labs Lab 09/23/14 1606 09/24/14 0350 09/25/14 0947 09/26/14 0417 09/27/14 0340  NA 141 138  --  141 139  K 4.2 3.8  --  3.8 3.4*  CL 103 100  --  107 103  CO2 13* 29  --  28 28  GLUCOSE 85 104*  --  93 154*  BUN 10 8  --  7 5*  CREATININE 1.09 0.81  --  0.63 0.75  CALCIUM 8.4 8.9  --  8.8 8.9  MG  --   --  1.4*  --   --    Liver Function Tests:  Recent Labs Lab 09/26/14 0417  AST 71*  ALT 41  ALKPHOS 48  BILITOT 0.7  PROT 5.7*  ALBUMIN 3.2*   No results for input(s): LIPASE, AMYLASE in the last 168 hours. No results for input(s): AMMONIA in the last 168 hours. CBC:  Recent Labs Lab 09/23/14 1606 09/24/14 0350 09/27/14 0340  WBC 8.9 7.3 4.6  NEUTROABS 7.6  --   --   HGB 15.2 14.0 13.2  HCT 44.5 40.6 40.1  MCV 100.9* 100.5* 103.1*  PLT 277 223 158   Cardiac Enzymes:  Recent Labs Lab 09/23/14 1606  TROPONINI 0.04*   BNP (last 3 results)  Recent Labs  09/23/14 1606  BNP 164.8*    ProBNP (last 3 results) No results for input(s): PROBNP in the last 8760 hours.  CBG: No results for input(s): GLUCAP in the last 168 hours.  Recent Results (from the past 240 hour(s))  MRSA PCR Screening     Status: None   Collection Time: 09/24/14 12:30 AM  Result Value Ref Range Status   MRSA by PCR NEGATIVE NEGATIVE Final    Comment:        The GeneXpert MRSA Assay (FDA approved for NASAL specimens only), is one component of a comprehensive MRSA colonization surveillance program. It is not intended to diagnose MRSA infection nor to guide or monitor treatment for MRSA infections.      Studies: No results found.  Scheduled Meds: . antiseptic oral rinse  7 mL  Mouth Rinse BID  . busPIRone  15 mg Oral BID  . diltiazem  120 mg Oral BID  . enoxaparin (LOVENOX) injection  1 mg/kg Subcutaneous Q12H  . FLUoxetine  20 mg Oral Daily  . folic acid  1 mg Oral Daily  . metoprolol  75 mg Oral BID  . multivitamin with minerals  1 tablet Oral Daily  . pantoprazole  40 mg Oral Daily  . QUEtiapine  50 mg Oral QHS  . sodium chloride  3 mL Intravenous Q12H  . thiamine  100 mg Oral Daily  . Warfarin - Pharmacist Dosing Inpatient   Does not apply q1800   Continuous Infusions: . sodium chloride 75 mL/hr at 09/27/14 1606    Principal Problem:   Alcohol withdrawal Active Problems:   HYPERTENSION, BENIGN   Atrial flutter   Alcohol abuse   Metabolic acidosis   Chronic systolic CHF (congestive heart failure)   Atrial flutter with rapid ventricular response    Time spent: 25 minutes    [facility] Pager [phone]  If 7PM-7AM, please contact night-coverage at [email or web address], password [facility] 09/28/2014, 9:14 AM  LOS: 5 days

## 2014-09-28 NOTE — Progress Notes (Signed)
ANTICOAGULATION CONSULT NOTE - [place] for Lovenox, Coumadin Indication: atrial fibrillation  Allergies  Allergen Reactions  . Aspirin Other (See Comments)    Unknown    Patient Measurements: Height: 6\' 3"  (190.5 cm) Weight: 199 lb 11.8 oz (90.6 kg) IBW/kg (Calculated) : 84.5  Vital Signs: Temp: 98.2 F (36.8 C) (03/16 1138) Temp Source: Oral (03/16 1138) BP: 115/84 mmHg (03/16 1138) Pulse Rate: 110 (03/16 1138)  Labs:  Recent Labs  09/26/14 0417 09/27/14 0340 09/28/14 1040  HGB  --  13.2  --   HCT  --  40.1  --   PLT  --  158  --   LABPROT 20.2* 21.0* 22.5*  INR 1.70* 1.79* 1.96*  CREATININE 0.63 0.75 0.72    Estimated Creatinine Clearance: 152.6 mL/min (by C-G formula based on Cr of 0.72).   Assessment: 39yo male on coumadin for h/o afib. Today's INR up to 1.96 - trending up. Continues on Lovenox bridge. No bleeding noted. CBC stable.  PTA warfarin dose: 7.5mg  daily with last dose 3/10  Goal of Therapy:  INR 2-3 Monitor platelets by anticoagulation protocol: Yes   Plan:  Lovenox 85 mg SQ q12h. D/c once INR >/= 2. Daily INR Warfarin 10 again tonight Continue to monitor H&H and platelets   Sherlon Handing, PharmD, BCPS Clinical pharmacist, pager [phone]  09/28/2014 1:15 PM

## 2014-09-29 DIAGNOSIS — Y909 Presence of alcohol in blood, level not specified: Secondary | ICD-10-CM

## 2014-09-29 LAB — PROTIME-INR
INR: 2.12 — ABNORMAL HIGH (ref 0.00–1.49)
Prothrombin Time: 24 seconds — ABNORMAL HIGH (ref 11.6–15.2)

## 2014-09-29 MED ORDER — METOPROLOL TARTRATE 25 MG PO TABS: 75.0000 mg | ORAL_TABLET | Freq: Two times a day (BID) | ORAL | Status: DC

## 2014-09-29 MED ORDER — ALPRAZOLAM 0.25 MG PO TABS: 0.2500 mg | ORAL_TABLET | Freq: Every evening | ORAL | Status: DC | PRN

## 2014-09-29 NOTE — Discharge Summary (Signed)
Physician Discharge Summary  Matthew [name] TMH:[id number] DOB: Feb 13, 1976 DOA: 09/23/2014  PCP: Glo Herring., MD  Admit date: 09/23/2014 Discharge date: 09/29/2014  Time spent: 30 minutes  Recommendations for Outpatient Follow-up:  1. Follow up with Dr Gerarda Fraction in am.   Discharge Diagnoses:  Principal Problem:   Alcohol withdrawal Active Problems:   HYPERTENSION, BENIGN   Atrial flutter   Alcohol abuse   Metabolic acidosis   Chronic systolic CHF (congestive heart failure)   Atrial flutter with rapid ventricular response   Discharge Condition: improved.   Diet recommendation: low sodium diet  Filed Weights   09/23/14 1900 09/23/14 2054 09/25/14 2306  Weight: 83 kg (182 lb 15.7 oz) 83.598 kg (184 lb 4.8 oz) 90.6 kg (199 lb 11.8 oz)    History of present illness:  39 year old male with h/o alcohol abuse, atrial flutter came in for ETOH withdrawal and atrial flutter.  Hospital Course:  1. Atrial flutter:  probably secondary to non compliance to med's and alcohol withdrawal.  Rater is much better today and in sinus. He is on Cardizem and metoprolol.  Increased the metoprolol to 75 mg BID. Resume coumadin on discharge. His INR is therapeutic.   2. ETOH withdrawal with delirium: He is alert and oriented, no hallucination or delirium this am.  Resume folic acid and thiamine.  Social worker consulted for chemical dependence rehab placement.he wants to go to [facility].  Psychiatry consulted and recommendations given.     3. Depression:  Currently on Seroquel . He denies any suicidal ideations.   4. Hypokalemia and hypomagnesemia: Replaced as needed. Repeat levels normal.   Procedures:  none  Consultations:  Psychiatry    Discharge Exam: Filed Vitals:   09/29/14 0739  BP: 97/77  Pulse: 81  Temp: 98.4 F (36.9 C)  Resp: 11    General: alert afebrile comfortable Cardiovascular: s1s2 Respiratory: ctab  Discharge  Instructions   Discharge Instructions    Diet - low sodium heart healthy    Complete by:  As directed      Discharge instructions    Complete by:  As directed   Follow up withPCP  Tomorrow .          Current Discharge Medication List    START taking these medications   Details  ALPRAZolam (XANAX) 0.25 MG tablet Take 1 tablet (0.25 mg total) by mouth at bedtime as needed for anxiety. Qty: 7 tablet, Refills: 0      CONTINUE these medications which have CHANGED   Details  metoprolol (LOPRESSOR) 25 MG tablet Take 3 tablets (75 mg total) by mouth 2 (two) times daily. Qty: 60 tablet, Refills: 1      CONTINUE these medications which have NOT CHANGED   Details  busPIRone (BUSPAR) 15 MG tablet Take 15 mg by mouth 2 (two) times daily.    diltiazem (CARDIZEM SR) 120 MG 12 hr capsule Take 1 capsule (120 mg total) by mouth 2 (two) times daily. Qty: 60 capsule, Refills: 1    FLUoxetine (PROZAC) 20 MG capsule Take 20 mg by mouth daily.    folic acid (FOLVITE) 1 MG tablet Take 1 tablet (1 mg total) by mouth daily.    Multiple Vitamin (MULTIVITAMIN WITH MINERALS) TABS tablet Take 1 tablet by mouth daily.    pantoprazole (PROTONIX) 40 MG tablet Take 40 mg by mouth daily.    QUEtiapine (SEROQUEL XR) 50 MG TB24 24 hr tablet Take 1 tablet (50 mg total) by mouth at bedtime.  Qty: 30 each, Refills: 0    thiamine 100 MG tablet Take 1 tablet (100 mg total) by mouth daily. Qty: 30 tablet, Refills: 11    warfarin (COUMADIN) 7.5 MG tablet Take 1 tablet (7.5 mg total) by mouth daily at 6 PM. Qty: 30 tablet, Refills: 1    feeding supplement, ENSURE COMPLETE, (ENSURE COMPLETE) LIQD Take 237 mLs by mouth 2 (two) times daily between meals.      STOP taking these medications     LORazepam (ATIVAN) 1 MG tablet        Allergies  Allergen Reactions  . Aspirin Other (See Comments)    Unknown   Follow-up Information    Follow up with Glo Herring., MD In 1 day.   Specialty:  Internal  Medicine   Why:  for follow up, he is planning to leave to [facility] on saturda and needs follow up with Dr Gerarda Fraction tomorrow.    Contact information:   [address] [place] [address] [phone]        The results of significant diagnostics from this hospitalization (including imaging, microbiology, ancillary and laboratory) are listed below for reference.    Significant Diagnostic Studies: Dg Chest 2 View  09/23/2014   CLINICAL DATA:  Tachycardia.  Weakness.  Chest pain.  EXAM: CHEST  2 VIEW  COMPARISON:  08/02/2014  FINDINGS: Thin sternotomy wires noted. The stable appearance of double contour along the aortic arch. Mild upper zone pulmonary vascular prominence. No cardiomegaly. No overt airway thickening.  No pleural effusion.  The lungs appear otherwise clear.  IMPRESSION: 1. Mild upper zone pulmonary vascular prominence could reflect pulmonary venous hypertension. No overt cardiomegaly. Prior pleural effusion and prior left basilar airspace opacity have resolved.   Electronically Signed   By: Van Clines M.D.   On: 09/23/2014 16:44    Microbiology: Recent Results (from the past 240 hour(s))  MRSA PCR Screening     Status: None   Collection Time: 09/24/14 12:30 AM  Result Value Ref Range Status   MRSA by PCR NEGATIVE NEGATIVE Final    Comment:        The GeneXpert MRSA Assay (FDA approved for NASAL specimens only), is one component of a comprehensive MRSA colonization surveillance program. It is not intended to diagnose MRSA infection nor to guide or monitor treatment for MRSA infections.      Labs: Basic Metabolic Panel:  Recent Labs Lab 09/23/14 1606 09/24/14 0350 09/25/14 0947 09/26/14 0417 09/27/14 0340 09/28/14 1040  NA 141 138  --  141 139 137  K 4.2 3.8  --  3.8 3.4* 4.0  CL 103 100  --  107 103 102  CO2 13* 29  --  28 28 30   GLUCOSE 85 104*  --  93 154* 145*  BUN 10 8  --  7 5* 5*  CREATININE 1.09 0.81  --  0.63 0.75 0.72  CALCIUM 8.4  8.9  --  8.8 8.9 8.9  MG  --   --  1.4*  --   --  1.5   Liver Function Tests:  Recent Labs Lab 09/26/14 0417  AST 71*  ALT 41  ALKPHOS 48  BILITOT 0.7  PROT 5.7*  ALBUMIN 3.2*   No results for input(s): LIPASE, AMYLASE in the last 168 hours. No results for input(s): AMMONIA in the last 168 hours. CBC:  Recent Labs Lab 09/23/14 1606 09/24/14 0350 09/27/14 0340  WBC 8.9 7.3 4.6  NEUTROABS 7.6  --   --  HGB 15.2 14.0 13.2  HCT 44.5 40.6 40.1  MCV 100.9* 100.5* 103.1*  PLT 277 223 158   Cardiac Enzymes:  Recent Labs Lab 09/23/14 1606  TROPONINI 0.04*   BNP: BNP (last 3 results)  Recent Labs  09/23/14 1606  BNP 164.8*    ProBNP (last 3 results) No results for input(s): PROBNP in the last 8760 hours.  CBG: No results for input(s): GLUCAP in the last 168 hours.     SignedHosie Poisson  [facility] 09/29/2014, 8:50 AM

## 2014-09-29 NOTE — Progress Notes (Signed)
Dr. Karleen Hampshire notified of VS, CIWA and pt states no complaints. Order received to continue with D/C and to advise patient to not take xanax.

## 2014-09-29 NOTE — Consult Note (Signed)
Psychiatry Consult follow-up note  Reason for Consult:  Capacity evaluation and alcohol abuse with withdrawal Referring Physician:  Dr. Broadus John Patient Identification: Matthew [name] MRN:  [id number] Principal Diagnosis: Alcohol withdrawal Diagnosis:   Patient Active Problem List   Diagnosis Date Noted  . Atrial flutter with rapid ventricular response [I48.92] 09/23/2014  . Chronic systolic congestive heart failure [I50.22]   . Tachycardia [R00.0] 07/28/2014  . SVT (supraventricular tachycardia) [I47.1]   . Hypocalcemia [E83.51]   . Transaminitis [R74.0]   . A-fib [I48.91] 04/21/2014  . Encounter for therapeutic drug monitoring [Z51.81] 01/07/2014  . Hallucination [R44.3] 01/04/2014  . 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 Chronic systolic CHF (congestive heart failure) [I50.22] 01/01/2014  . Pulmonary hypertension [I27.0] 12/31/2013  . Metabolic acidosis [G64.4] 12/30/2013  . Protein-calorie malnutrition, severe [E43] 12/30/2013  . Nausea vomiting and diarrhea [R11.2, R19.7] 12/29/2013  . Atrial flutter [I48.92] 12/29/2013  . Hypokalemia [E87.6] 12/29/2013  . Hypomagnesemia [E83.42] 12/29/2013  . Alcohol abuse [F10.10] 12/29/2013  . Alcohol withdrawal [F10.239] 12/29/2013  . Dehydration with hyponatremia [E87.1] 12/29/2013  . Depression [F32.9] 12/29/2013  . Hyperglycemia [R73.9] 12/29/2013  . Melena [K92.1] 12/29/2013  . Elevated transaminase level [R74.0] 12/29/2013  . HYPERLIPIDEMIA-MIXED [E78.5] 04/21/2009  . HYPERTENSION, BENIGN [I10] 04/21/2009  . OTHER DISORDERS PAPILLARY MUSCLE [M62.9] 01/24/2009  . 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 male patient admitted with alcohol abuse with withdrawal.  HPI:  Matthew [name] is a 39 y.o. male seen, chart reviewed for psychiatric consultation and evaluation of alcohol abuse versus  dependence and depression along with anxiety. Patient reported he has been suffering with depression and anxiety over several years and also alcohol dependence. Patient reported he has previous alcohol detox treatments. Patient has been separated from his wife of 5 years who chose another man and left him 2 years ago. Patient has been alone, disabled secondary to multiple medical problems including congestive heart failure and atrial fibrillation. Patient has 3 children and a joint custody. Patient reported he was able to stay 45 days sober before he was relapsed drinking alcohol regularly. Patient reported his drinking one fifth of vodka daily until he was admitted for the last 1 week. Patient mother is supportive to him and hoping that he will be able to participate in rehabilitation for substance abuse in out of the [place] in [place]. Reportedly patient is known to change his mind when he was feeling better regarding rehabilitation treatment. Patient does not see psychiatry but has been receiving psychiatric medication management from family physician in [place], [place].  Interval history: Patient seen for psychiatric consultation follow-up today and case discussed with the staff RN. Patient has no complaints today and staff nurse reported his blood pressure was slightly drop this morning and has been closely monitored. Patient CIWA is 71 today and has been compliant with his medication. Patient reported he does not have any withdrawal symptoms since this morning. Patient and his mother working along with the unit social worker regarding his alcohol rehabilitation program and state of [place] which patient is willing to participate upon discharge when he completed detox treatment. Case discussed with Dr. Karleen Hampshire and agree with the treatment plan. Recommended no Xanax and June 2 increased addiction potential. If patient needed more medication for anxiety is BuSpar can be given  3 times a day.  Medical history:  Patient with a history of Alcohol Abuse, Atrial Flutter and HTN who reports that he had abstained from alcohol until 2 weeks ago, and he began to drink one Fifth of alcohol every 2 days. He presented to the ED due to increased palpitations, and he reports that he was having shakes and sweats. His last drink was in the AM. He was evaluated in the ED and found to have Atrial Flutter with AVR, and he reports not taking his medications due to his drinking.He has UDS is positive for benzo's and cannabis   Past Medical History:  Past Medical History  Diagnosis Date  . Complete transposition of great vessels   . Hypertension   . Dyslipidemia   . Depression   . Alcohol abuse   . 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 SVT (supraventricular tachycardia) "several times in the last year" (09/24/2014)  . Heart murmur   . Anxiety   . GERD (gastroesophageal reflux disease)   . Headache     "weekly" (09/24/2014)    Past Surgical History  Procedure Laterality Date  . Finger arthroplasty Right     volar plate arthroplasty, right small finger proxima linterphalangeal joint [Other]  . Septostomy      Rashkind balloon atrial septostomy   . Transposition of great vessels repair  1980    "mustard procedure"  . Fracture surgery     Family History:  Family History  Problem Relation Age of Onset  . Rheumatic fever Father   . Hypertension Father    Social History:  History  Alcohol Use  . 36.0 oz/week  . 52 Shots of liquor per week    Comment: 09/24/2014 "1/5th vodka in 2 days; easy"     History  Drug Use No    History   Social History  . Marital Status: Legally Separated    Spouse Name: N/A  . Number of Children: N/A  . Years of Education: N/A   Social History Main Topics  . Smoking status: Never Smoker   . Smokeless tobacco: Former Systems developer    Types: Snuff     Comment: "dipped when I played softball; none since 2011"  . Alcohol Use: 36.0 oz/week     60 Shots of liquor per week     Comment: 09/24/2014 "1/5th vodka in 2 days; easy"  . Drug Use: No  . Sexual Activity: Yes   Other Topics Concern  . None   Social History Narrative   Additional Social History:                          Allergies:   Allergies  Allergen Reactions  . Aspirin Other (See Comments)    Unknown    Vitals: Blood pressure 97/77, pulse 81, temperature 98.4 F (36.9 C), temperature source Oral, resp. rate 11, height 6\' 3"  (1.905 m), weight 90.6 kg (199 lb 11.8 oz), SpO2 94 %.  Risk to Self: Is patient at risk for suicide?: No Risk to Others:   Prior Inpatient Therapy:   Prior Outpatient Therapy:    Current Facility-Administered Medications  Medication Dose Route Frequency Provider Last Rate Last Dose  . acetaminophen (TYLENOL) tablet 650 mg  650 mg Oral Q6H PRN Theressa Millard, MD   650 mg at 09/24/14 1040   Or  . acetaminophen (TYLENOL) suppository 650 mg  650 mg Rectal Q6H PRN Theressa Millard, MD      . alum & mag hydroxide-simeth (MAALOX/MYLANTA) 200-200-20  MG/5ML suspension 30 mL  30 mL Oral Q6H PRN Theressa Millard, MD   30 mL at 09/23/14 2248  . antiseptic oral rinse (CPC / CETYLPYRIDINIUM CHLORIDE 0.05%) solution 7 mL  7 mL Mouth Rinse BID Theressa Millard, MD   7 mL at 09/28/14 2200  . busPIRone (BUSPAR) tablet 15 mg  15 mg Oral BID Theressa Millard, MD   15 mg at 09/28/14 2256  . diltiazem (CARDIZEM SR) 12 hr capsule 120 mg  120 mg Oral BID Theressa Millard, MD   120 mg at 09/28/14 2026  . FLUoxetine (PROZAC) capsule 20 mg  20 mg Oral Daily Theressa Millard, MD   20 mg at 09/28/14 1007  . folic acid (FOLVITE) tablet 1 mg  1 mg Oral Daily Theressa Millard, MD   1 mg at 09/28/14 1007  . LORazepam (ATIVAN) injection 2-3 mg  2-3 mg Intravenous Q1H PRN Domenic Polite, MD   2 mg at 09/28/14 2029  . metoprolol tartrate (LOPRESSOR) tablet 75 mg  75 mg Oral BID Hosie Poisson, MD   75 mg at 09/28/14 2027  . multivitamin with  minerals tablet 1 tablet  1 tablet Oral Daily Theressa Millard, MD   1 tablet at 09/28/14 1006  . ondansetron (ZOFRAN) tablet 4 mg  4 mg Oral Q6H PRN Theressa Millard, MD       Or  . ondansetron (ZOFRAN) injection 4 mg  4 mg Intravenous Q6H PRN Theressa Millard, MD   4 mg at 09/24/14 0124  . oxyCODONE (Oxy IR/ROXICODONE) immediate release tablet 5 mg  5 mg Oral Q4H PRN Theressa Millard, MD   5 mg at 09/28/14 2026  . pantoprazole (PROTONIX) EC tablet 40 mg  40 mg Oral Daily Theressa Millard, MD   40 mg at 09/28/14 1007  . QUEtiapine (SEROQUEL XR) 24 hr tablet 50 mg  50 mg Oral QHS Theressa Millard, MD   50 mg at 09/28/14 2027  . sodium chloride 0.9 % injection 3 mL  3 mL Intravenous Q12H Theressa Millard, MD   3 mL at 09/28/14 2033  . thiamine (VITAMIN B-1) tablet 100 mg  100 mg Oral Daily Serita Grit, MD   100 mg at 09/28/14 1006  . Warfarin - Pharmacist Dosing Inpatient   Does not apply q1800 Theressa Millard, MD        Musculoskeletal: Strength & Muscle Tone: decreased Gait & Station: unable to stand Patient leans: N/A  Psychiatric Specialty Exam: Physical Exam as per history and physical   ROS handshakes, tremors, sweating, nausea and vomiting   Blood pressure 97/77, pulse 81, temperature 98.4 F (36.9 C), temperature source Oral, resp. rate 11, height 6\' 3"  (1.905 m), weight 90.6 kg (199 lb 11.8 oz), SpO2 94 %.Body mass index is 24.97 kg/(m^2).  General Appearance: Casual  Eye Contact::  Good  Speech:  Clear and Coherent  Volume:  Normal  Mood:  Depressed  Affect:  Appropriate and Congruent  Thought Process:  Coherent and Goal Directed  Orientation:  Full (Time, Place, and Person)  Thought Content:  WDL  Suicidal Thoughts:  No  Homicidal Thoughts:  No  Memory:  Immediate;   Good Recent;   Good Remote;   Good  Judgement:  Intact  Insight:  Good  Psychomotor Activity:  Decreased  Concentration:  Good  Recall:  Good  Fund of Knowledge:Good  Language: Good   Akathisia:  Negative  Handed:  Right  AIMS (if indicated):     Assets:  Communication Skills Desire for Improvement Housing Leisure Time Resilience Social Support Transportation  ADL's:  Impaired  Cognition: WNL  Sleep:      Medical Decision Making: New problem, with additional work up planned, Review of Psycho-Social Stressors (1), Review or order clinical lab tests (1), Established Problem, Worsening (2), Review or order medicine tests (1), Review of Medication Regimen & Side Effects (2) and Review of New Medication or Change in Dosage (2)  Treatment Plan Summary: Daily contact with patient to assess and evaluate symptoms and progress in treatment and Medication management  Plan:  Continue alcohol detox treatment and supportive treatment  Continue BuSpar 50 mg 2 times a day for anxiety, fluoxetine 20 mg daily for depression and Seroquel 50 mg at bedtime for mood swings Patient does not meet criteria for psychiatric inpatient admission. Supportive therapy provided about ongoing stressors. Appreciate psychiatric consultation and follow up as clinically required Please contact [phone] or [phone] if needs further assistance  Disposition: Patient benefit from long-term residential chemical dependency rehabilitation treatment when medically stable. Reportedly patient mother working with the units social worker regarding a rehabilitation program in state of [place] which patient is willing to participate.  Shannan Slinker,JANARDHAHA R. 09/29/2014 10:48 AM

## 2014-09-29 NOTE — Discharge Instructions (Signed)
Follow up appointment with Dr. Collene Mares arrive at 0945 for 1000 appointment.

## 2014-11-17 ENCOUNTER — Other Ambulatory Visit: Payer: Self-pay | Admitting: Internal Medicine

## 2015-06-14 ENCOUNTER — Ambulatory Visit (INDEPENDENT_AMBULATORY_CARE_PROVIDER_SITE_OTHER): Payer: BLUE CROSS/BLUE SHIELD | Admitting: *Deleted

## 2015-06-14 DIAGNOSIS — Z5181 Encounter for therapeutic drug level monitoring: Secondary | ICD-10-CM

## 2015-06-14 DIAGNOSIS — I4892 Unspecified atrial flutter: Secondary | ICD-10-CM | POA: Diagnosis not present

## 2015-06-14 LAB — POCT INR: INR: 4.7

## 2015-06-26 ENCOUNTER — Ambulatory Visit (INDEPENDENT_AMBULATORY_CARE_PROVIDER_SITE_OTHER): Payer: BLUE CROSS/BLUE SHIELD | Admitting: *Deleted

## 2015-06-26 DIAGNOSIS — I4892 Unspecified atrial flutter: Secondary | ICD-10-CM | POA: Diagnosis not present

## 2015-06-26 DIAGNOSIS — Z5181 Encounter for therapeutic drug level monitoring: Secondary | ICD-10-CM | POA: Diagnosis not present

## 2015-06-26 LAB — POCT INR: INR: 3.5

## 2015-07-05 ENCOUNTER — Ambulatory Visit (INDEPENDENT_AMBULATORY_CARE_PROVIDER_SITE_OTHER): Payer: BLUE CROSS/BLUE SHIELD | Admitting: Pharmacist

## 2015-07-05 DIAGNOSIS — Z5181 Encounter for therapeutic drug level monitoring: Secondary | ICD-10-CM | POA: Diagnosis not present

## 2015-07-05 DIAGNOSIS — I4892 Unspecified atrial flutter: Secondary | ICD-10-CM

## 2015-07-05 LAB — POCT INR: INR: 2.3

## 2015-07-05 MED ORDER — WARFARIN SODIUM 5 MG PO TABS: 5.0000 mg | ORAL_TABLET | Freq: Every day | ORAL | Status: DC

## 2015-07-05 MED ORDER — METOPROLOL TARTRATE 25 MG PO TABS: 75.0000 mg | ORAL_TABLET | Freq: Two times a day (BID) | ORAL | Status: DC

## 2015-07-05 MED ORDER — DILTIAZEM HCL ER 120 MG PO CP12: 120.0000 mg | ORAL_CAPSULE | Freq: Two times a day (BID) | ORAL | Status: DC

## 2015-07-18 ENCOUNTER — Telehealth: Payer: Self-pay | Admitting: Internal Medicine

## 2015-07-18 NOTE — Telephone Encounter (Signed)
New message       Pt has a head cold------coughing and sneezing.  What can he take with his heart medications?

## 2015-07-18 NOTE — Telephone Encounter (Signed)
Spoke with patient and advised him that he can take plain Mucinex without decongestant.  He asked about a natural cough syrup that he gives his children.  I advised him that he may take as long as it doesn't contain decongestant.  I advised him to be aware of palpitations and or elevated pulse or blood pressure.  He verbalized understanding and agreement.

## 2015-07-28 ENCOUNTER — Ambulatory Visit (INDEPENDENT_AMBULATORY_CARE_PROVIDER_SITE_OTHER): Payer: BLUE CROSS/BLUE SHIELD | Admitting: Pharmacist

## 2015-07-28 ENCOUNTER — Ambulatory Visit (INDEPENDENT_AMBULATORY_CARE_PROVIDER_SITE_OTHER): Payer: BLUE CROSS/BLUE SHIELD | Admitting: Internal Medicine

## 2015-07-28 ENCOUNTER — Encounter: Payer: Self-pay | Admitting: Internal Medicine

## 2015-07-28 VITALS — BP 122/78 | HR 58 | Ht 75.0 in | Wt 210.4 lb

## 2015-07-28 DIAGNOSIS — Z5181 Encounter for therapeutic drug level monitoring: Secondary | ICD-10-CM

## 2015-07-28 DIAGNOSIS — I4892 Unspecified atrial flutter: Secondary | ICD-10-CM

## 2015-07-28 DIAGNOSIS — I10 Essential (primary) hypertension: Secondary | ICD-10-CM

## 2015-07-28 DIAGNOSIS — Q248 Other specified congenital malformations of heart: Secondary | ICD-10-CM

## 2015-07-28 DIAGNOSIS — Q203 Discordant ventriculoarterial connection: Secondary | ICD-10-CM

## 2015-07-28 LAB — POCT INR: INR: 3.4

## 2015-07-28 NOTE — Progress Notes (Signed)
Cardiology Office Note   Date:  07/28/2015   ID:  Matthew [name], DOB [birth year], MRN [id number]  PCP:  Matthew [name]., MD  Cardiologist:   Matthew Carnes, MD   No chief complaint on file.  F/U of congenital heart dz and atrial flutter      History of Present Illness: Matthew [name] is a 40 y.o. male with a history ofTGV, sp Mustard procedure. He also has a history of HTN  I saw him in [place] in 2014 He was seen by Matthew [name] in 2015  Hx of atypical atrial flutter  Admtted   Had DTs.  Followed by Matthew [name] iand referred to [facility] for ablation .    He says he has been feeling good  No palpitations  Breathing is OK No longer drinking     Current Outpatient Prescriptions  Medication Sig Dispense Refill  . diltiazem (CARDIZEM SR) 120 MG 12 hr capsule Take 1 capsule (120 mg total) by mouth 2 (two) times daily. 60 capsule 0  . metoprolol tartrate (LOPRESSOR) 25 MG tablet Take 3 tablets (75 mg total) by mouth 2 (two) times daily. 180 tablet 0  . warfarin (COUMADIN) 5 MG tablet Take 1 tablet (5 mg total) by mouth daily. 30 tablet 1   No current facility-administered medications for this visit.    Allergies:   Aspirin   Past Medical History  Diagnosis Date  . Complete transposition of great vessels   . Hypertension   . Dyslipidemia   . Depression   . Alcohol abuse   . 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 SVT (supraventricular tachycardia) ([place]) "several times in the last year" (09/24/2014)  . Heart murmur   . Anxiety   . GERD (gastroesophageal reflux disease)   . Headache     "weekly" (09/24/2014)    Past Surgical History  Procedure Laterality Date  . Finger arthroplasty Right     volar plate arthroplasty, right small finger proxima linterphalangeal joint [Other]  . Septostomy      Rashkind balloon atrial septostomy   . Transposition of great vessels repair  1980    "mustard procedure"  . Fracture surgery       Social History:  The patient  reports that he has  never smoked. He has quit using smokeless tobacco. His smokeless tobacco use included Snuff. He reports that he drinks about 36.0 oz of alcohol per week. He reports that he does not use illicit drugs.   Family History:  The patient's family history includes Hypertension in his father; Rheumatic fever in his father.    ROS:  Please see the history of present illness. All other systems are reviewed and  Negative to the above problem except as noted.    PHYSICAL EXAM: VS:  BP 122/78 mmHg  Pulse 58  Ht 6\' 3"  (1.905 m)  Wt 95.437 kg (210 lb 6.4 oz)  BMI 26.30 kg/m2  GEN: Well nourished, well developed, in no acute distress HEENT: normal Neck: no JVD, carotid bruits, or masses Cardiac: RRR; no murmurs, rubs, or gallops,no edema  Respiratory:  clear to auscultation bilaterally, normal work of breathing GI: soft, nontender, nondistended, + BS  No hepatomegaly  MS: no deformity Moving all extremities   Skin: warm and dry, no rash Neuro:  Strength and sensation are intact Psych: euthymic mood, full affect   EKG:  EKG is ordered today. SB 57 bpm  incompe RBBB     Lipid Panel    Component  Value Date/Time   CHOL 150 07/29/2014 0407   TRIG 87 07/29/2014 0407   HDL 38* 07/29/2014 0407   CHOLHDL 3.9 07/29/2014 0407   VLDL 17 07/29/2014 0407   LDLCALC 95 07/29/2014 0407   LDLDIRECT 162.4 08/10/2012 1143      Wt Readings from Last 3 Encounters:  07/28/15 95.437 kg (210 lb 6.4 oz)  09/25/14 90.6 kg (199 lb 11.8 oz)  08/05/14 83.008 kg (183 lb)      ASSESSMENT AND PLAN:  1.  TGA  Will set up for repeat echo   2.  Atrial flutter  Will review echo  No recurrence.  Continue anticoagulaiton  Would like to stop dilt  Resume ACE I  WIl lreview after echo    3  EtOH  Pt is sober     Signed, Matthew Carnes, MD  07/28/2015 11:06 AM    [facility] [place], [place], [place]  [address] Phone: [phone]; Fax: [phone]

## 2015-07-28 NOTE — Patient Instructions (Signed)

## 2015-08-02 ENCOUNTER — Other Ambulatory Visit (HOSPITAL_COMMUNITY): Payer: Self-pay

## 2015-08-04 ENCOUNTER — Other Ambulatory Visit: Payer: Self-pay | Admitting: *Deleted

## 2015-08-04 MED ORDER — DILTIAZEM HCL ER 120 MG PO CP12: 120.0000 mg | ORAL_CAPSULE | Freq: Two times a day (BID) | ORAL | Status: DC

## 2015-08-04 MED ORDER — METOPROLOL TARTRATE 25 MG PO TABS: 75.0000 mg | ORAL_TABLET | Freq: Two times a day (BID) | ORAL | Status: DC

## 2015-08-04 MED ORDER — WARFARIN SODIUM 5 MG PO TABS: ORAL_TABLET | ORAL | Status: DC

## 2015-08-10 ENCOUNTER — Other Ambulatory Visit (HOSPITAL_COMMUNITY): Payer: Self-pay

## 2015-08-16 ENCOUNTER — Ambulatory Visit (INDEPENDENT_AMBULATORY_CARE_PROVIDER_SITE_OTHER): Payer: BLUE CROSS/BLUE SHIELD | Admitting: *Deleted

## 2015-08-16 DIAGNOSIS — I4892 Unspecified atrial flutter: Secondary | ICD-10-CM

## 2015-08-16 DIAGNOSIS — Z5181 Encounter for therapeutic drug level monitoring: Secondary | ICD-10-CM | POA: Diagnosis not present

## 2015-08-16 LAB — POCT INR: INR: 3.4

## 2015-08-21 ENCOUNTER — Ambulatory Visit (HOSPITAL_COMMUNITY): Payer: BLUE CROSS/BLUE SHIELD | Attending: Cardiovascular Disease

## 2015-08-21 ENCOUNTER — Other Ambulatory Visit: Payer: Self-pay | Admitting: Internal Medicine

## 2015-08-21 DIAGNOSIS — Q203 Discordant ventriculoarterial connection: Secondary | ICD-10-CM | POA: Insufficient documentation

## 2015-08-21 DIAGNOSIS — I351 Nonrheumatic aortic (valve) insufficiency: Secondary | ICD-10-CM | POA: Diagnosis not present

## 2015-08-21 DIAGNOSIS — I10 Essential (primary) hypertension: Secondary | ICD-10-CM | POA: Diagnosis not present

## 2015-08-31 ENCOUNTER — Telehealth: Payer: Self-pay | Admitting: Internal Medicine

## 2015-08-31 NOTE — Telephone Encounter (Signed)
Pt would like his echo results from about a 2 weeks ago.

## 2015-09-04 MED ORDER — LOSARTAN POTASSIUM 50 MG PO TABS: 50.0000 mg | ORAL_TABLET | Freq: Every day | ORAL | Status: DC

## 2015-09-04 NOTE — Telephone Encounter (Signed)
Message     Pt contacted re echo Reviewed. Overall rel unchanged from previous echo    I would recomm that he stop dilt Start back on losartan 50 He had been on this in past    Continue metoprolol     F/U in clinic in 4 wks (OK to add on)

## 2015-09-06 ENCOUNTER — Ambulatory Visit (INDEPENDENT_AMBULATORY_CARE_PROVIDER_SITE_OTHER): Payer: BLUE CROSS/BLUE SHIELD | Admitting: *Deleted

## 2015-09-06 DIAGNOSIS — Z5181 Encounter for therapeutic drug level monitoring: Secondary | ICD-10-CM | POA: Diagnosis not present

## 2015-09-06 DIAGNOSIS — I4892 Unspecified atrial flutter: Secondary | ICD-10-CM

## 2015-09-06 LAB — POCT INR: INR: 3.3

## 2015-09-07 MED ORDER — METOPROLOL TARTRATE 25 MG PO TABS: 75.0000 mg | ORAL_TABLET | Freq: Two times a day (BID) | ORAL | Status: DC

## 2015-09-07 NOTE — Telephone Encounter (Signed)
Scheduled patient for 3/31. Reordered metoprolol as 90 day supply per patient request.

## 2015-09-27 ENCOUNTER — Ambulatory Visit (INDEPENDENT_AMBULATORY_CARE_PROVIDER_SITE_OTHER): Payer: BLUE CROSS/BLUE SHIELD | Admitting: *Deleted

## 2015-09-27 DIAGNOSIS — Z5181 Encounter for therapeutic drug level monitoring: Secondary | ICD-10-CM | POA: Diagnosis not present

## 2015-09-27 DIAGNOSIS — I4892 Unspecified atrial flutter: Secondary | ICD-10-CM | POA: Diagnosis not present

## 2015-09-27 LAB — POCT INR: INR: 1.8

## 2015-10-05 ENCOUNTER — Other Ambulatory Visit: Payer: Self-pay | Admitting: Internal Medicine

## 2015-10-11 ENCOUNTER — Encounter: Payer: Self-pay | Admitting: *Deleted

## 2015-10-13 ENCOUNTER — Ambulatory Visit (INDEPENDENT_AMBULATORY_CARE_PROVIDER_SITE_OTHER): Payer: BLUE CROSS/BLUE SHIELD | Admitting: Internal Medicine

## 2015-10-13 ENCOUNTER — Encounter: Payer: Self-pay | Admitting: Internal Medicine

## 2015-10-13 VITALS — BP 126/88 | HR 56 | Ht 75.0 in | Wt 212.8 lb

## 2015-10-13 DIAGNOSIS — E785 Hyperlipidemia, unspecified: Secondary | ICD-10-CM | POA: Diagnosis not present

## 2015-10-13 DIAGNOSIS — Z7901 Long term (current) use of anticoagulants: Secondary | ICD-10-CM | POA: Diagnosis not present

## 2015-10-13 DIAGNOSIS — R0602 Shortness of breath: Secondary | ICD-10-CM | POA: Diagnosis not present

## 2015-10-13 DIAGNOSIS — I4892 Unspecified atrial flutter: Secondary | ICD-10-CM | POA: Diagnosis not present

## 2015-10-13 LAB — PROTIME-INR
INR: 1.96 — ABNORMAL HIGH
Prothrombin Time: 22.6 s — ABNORMAL HIGH (ref 11.6–15.2)

## 2015-10-13 LAB — CBC WITH DIFFERENTIAL/PLATELET
Basophils Absolute: 0 K/uL (ref 0.0–0.1)
Basophils Relative: 0 % (ref 0–1)
Eosinophils Absolute: 0.2 K/uL (ref 0.0–0.7)
Eosinophils Relative: 2 % (ref 0–5)
HCT: 41.7 % (ref 39.0–52.0)
Hemoglobin: 14.1 g/dL (ref 13.0–17.0)
Lymphocytes Relative: 24 % (ref 12–46)
Lymphs Abs: 2 K/uL (ref 0.7–4.0)
MCH: 29.2 pg (ref 26.0–34.0)
MCHC: 33.8 g/dL (ref 30.0–36.0)
MCV: 86.3 fL (ref 78.0–100.0)
MPV: 9 fL (ref 8.6–12.4)
Monocytes Absolute: 0.9 K/uL (ref 0.1–1.0)
Monocytes Relative: 10 % (ref 3–12)
Neutro Abs: 5.4 K/uL (ref 1.7–7.7)
Neutrophils Relative %: 64 % (ref 43–77)
Platelets: 272 K/uL (ref 150–400)
RBC: 4.83 MIL/uL (ref 4.22–5.81)
RDW: 14.3 % (ref 11.5–15.5)
WBC: 8.5 K/uL (ref 4.0–10.5)

## 2015-10-13 LAB — LIPID PANEL
Cholesterol: 160 mg/dL (ref 125–200)
HDL: 39 mg/dL — ABNORMAL LOW (ref >=40)
LDL Cholesterol: 111 mg/dL (ref <130)
Total CHOL/HDL Ratio: 4.1 Ratio (ref <=5.0)
Triglycerides: 49 mg/dL (ref <150)
VLDL: 10 mg/dL (ref <30)

## 2015-10-13 LAB — BASIC METABOLIC PANEL WITH GFR
BUN: 6 mg/dL — ABNORMAL LOW (ref 7–25)
CO2: 28 mmol/L (ref 20–31)
Calcium: 9 mg/dL (ref 8.6–10.3)
Chloride: 104 mmol/L (ref 98–110)
Creat: 0.6 mg/dL (ref 0.60–1.35)
Glucose, Bld: 72 mg/dL (ref 65–99)
Potassium: 4.1 mmol/L (ref 3.5–5.3)
Sodium: 141 mmol/L (ref 135–146)

## 2015-10-13 LAB — BRAIN NATRIURETIC PEPTIDE: Brain Natriuretic Peptide: 108.5 pg/mL — ABNORMAL HIGH

## 2015-10-13 NOTE — Progress Notes (Signed)
Cardiology Office Note   Date:  10/13/2015   ID:  MARKAS BAUMEL, DOB 18-Jun-1976, MRN [id number]  PCP:  Mickie Hillier, MD  Cardiologist:   Dorris Carnes, MD   Follow up of congenital heart dz     History of Present Illness: Matthew [name] is a 40 y.o. male with a history of  TGV, sp Mustard procedure. 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 losartan (COZAAR) 50 MG tablet Take 1 tablet (50 mg total) by mouth daily. 90 tablet 3  . metoprolol tartrate (LOPRESSOR) 25 MG tablet Take 3 tablets (75 mg total) by mouth 2 (two) times daily. 540 tablet 3  . warfarin (COUMADIN) 5 MG tablet TAKE BY MOUTH AS DIRECTED BY COUMADIN CLINIC 30 tablet 3   No facility-administered medications prior to visit.     Allergies:   Aspirin   Past Medical History  Diagnosis Date  . Complete transposition of great vessels   . Hypertension   . Dyslipidemia   . Depression   . Alcohol abuse   . 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 SVT (supraventricular tachycardia) ([place]) "several times in the last year" (09/24/2014)  . Heart murmur   . Anxiety   . GERD (gastroesophageal reflux disease)   . Headache     "weekly" (09/24/2014)    Past Surgical History  Procedure Laterality Date  . Finger arthroplasty Right     volar plate arthroplasty, right small finger proxima linterphalangeal joint [Other]  . Septostomy      Rashkind balloon atrial septostomy   . Transposition of great vessels repair  1980    "mustard procedure"  .  Fracture surgery       Social History:  The patient  reports that he has never smoked. His smokeless tobacco use includes Snuff. He reports that he drinks about 36.0 oz of alcohol per week. He reports that he does not use illicit drugs.   Family History:  The patient's family history includes Hypertension in his father; Rheumatic fever in his father.    ROS:  Please see the history of present illness. All other systems are reviewed and  Negative to the above problem except as noted.    PHYSICAL EXAM: VS:  BP 126/88 mmHg  Pulse 56  Ht 6\' 3"  (1.905 m)  Wt 212 lb 12.8 oz (96.525 kg)  BMI 26.60 kg/m2  GEN: Well nourished, well developed, in no acute distress HEENT: normal Neck: no JVD, carotid bruits, or masses Cardiac: RRR; no murmurs, rubs, or gallops  Prom P2 Tr edema  Respiratory:  clear to auscultation bilaterally, normal work of breathing GI: soft, nontender, nondistended, + BS  No hepatomegaly  MS: no deformity Moving all extremities   Skin: warm and dry, no rash Neuro:  Strength and sensation are intact Psych: euthymic mood, full affect  EKG:  EKG is ordered today.  SB 59   First degree AV block  PR 208 msec  Incomp RBBB  T wve inversion inferolaterally     Lipid Panel    Component Value Date/Time   CHOL 150 07/29/2014 0407   TRIG 87 07/29/2014 0407   HDL 38* 07/29/2014 0407   CHOLHDL 3.9 07/29/2014 0407   VLDL 17 07/29/2014 0407   LDLCALC 95 07/29/2014 0407   LDLDIRECT 162.4 08/10/2012 1143      Wt Readings from Last 3 Encounters:  10/13/15 212 lb 12.8 oz (96.525 kg)  07/28/15 210 lb 6.4 oz (95.437 kg)  09/25/14 199 lb 11.8 oz (90.6 kg)      ASSESSMENT AND PLAN:  1  Rhythm  Sinus rhythm  today with similar conduction intervals as in previous EKG  Will set up for event monitor to see if indeed back in flutter when feels bad  I am not convinced     2  TGV  I reviewed echo  Difficult study  I am not convinced different from previous  Not sure about  His  exam is rel unchanged  He says he is a little more swollen  I am not convinced   Will check labs     Will check CBC, BMET, BNP, INR and lipid today    Disposition:   FU with me will depend on lab resutls    Signed, Dorris Carnes, MD  10/13/2015 8:[address], [place], [place]  [address] Phone: [phone]; Fax: [phone]

## 2015-10-13 NOTE — Patient Instructions (Signed)
Medication Instructions:  Same-no changes  Labwork: Lipids, BNP, INR, BMET and CBC-today  Testing/Procedures: Your physician has recommended that you wear an event monitor. Event monitors are medical devices that record the heart's electrical activity. Doctors most often [place] these monitors to diagnose arrhythmias. Arrhythmias are problems with the speed or rhythm of the heartbeat. The monitor is a small, portable device. You can wear one while you do your normal daily activities. This is usually used to diagnose what is causing palpitations/syncope (passing out).   Follow-Up: To be determined     If you need a refill on your cardiac medications before your next appointment, please call your pharmacy.

## 2015-10-17 ENCOUNTER — Ambulatory Visit (INDEPENDENT_AMBULATORY_CARE_PROVIDER_SITE_OTHER): Payer: BLUE CROSS/BLUE SHIELD | Admitting: Pharmacist

## 2015-10-17 DIAGNOSIS — I4892 Unspecified atrial flutter: Secondary | ICD-10-CM

## 2015-10-17 DIAGNOSIS — Z5181 Encounter for therapeutic drug level monitoring: Secondary | ICD-10-CM

## 2015-10-19 ENCOUNTER — Ambulatory Visit (INDEPENDENT_AMBULATORY_CARE_PROVIDER_SITE_OTHER): Payer: BLUE CROSS/BLUE SHIELD

## 2015-10-19 DIAGNOSIS — I4892 Unspecified atrial flutter: Secondary | ICD-10-CM

## 2015-11-01 ENCOUNTER — Ambulatory Visit (INDEPENDENT_AMBULATORY_CARE_PROVIDER_SITE_OTHER): Payer: BLUE CROSS/BLUE SHIELD | Admitting: *Deleted

## 2015-11-01 DIAGNOSIS — Z5181 Encounter for therapeutic drug level monitoring: Secondary | ICD-10-CM | POA: Diagnosis not present

## 2015-11-01 DIAGNOSIS — I4892 Unspecified atrial flutter: Secondary | ICD-10-CM

## 2015-11-01 LAB — POCT INR: INR: 2.2

## 2015-11-21 ENCOUNTER — Telehealth: Payer: Self-pay | Admitting: Internal Medicine

## 2015-11-21 NOTE — Telephone Encounter (Signed)
New messagae   Pt calling for results of monitor   that he has now turned back in  Pt verbalized that it has been 2 or 3 weeks

## 2015-11-21 NOTE — Telephone Encounter (Signed)
Spoke with the patient. His event monitor was placed on 10/19/15.  He returned it after about 21 days of wearing it due to skin irritation. I have sent a message inquiring about the status of the final report to the monitor techs. Informed patient that once it is received and reviewed by Dr. Harrington Challenger, we will contact him with the results.  He verbalizes understanding and is appreciative for the call back today.

## 2015-11-29 ENCOUNTER — Ambulatory Visit (INDEPENDENT_AMBULATORY_CARE_PROVIDER_SITE_OTHER): Payer: BLUE CROSS/BLUE SHIELD | Admitting: *Deleted

## 2015-11-29 DIAGNOSIS — I4892 Unspecified atrial flutter: Secondary | ICD-10-CM | POA: Diagnosis not present

## 2015-11-29 DIAGNOSIS — Z5181 Encounter for therapeutic drug level monitoring: Secondary | ICD-10-CM | POA: Diagnosis not present

## 2015-11-29 LAB — POCT INR: INR: 1.9

## 2015-12-04 NOTE — Telephone Encounter (Signed)
Notes Recorded by Fay Records, MD on 11/24/2015 at 4:50 PM Patient called about results of monitor Feeling better Active  No new recommendations

## 2015-12-13 ENCOUNTER — Telehealth: Payer: Self-pay

## 2015-12-13 ENCOUNTER — Telehealth: Payer: Self-pay | Admitting: Internal Medicine

## 2015-12-13 NOTE — Telephone Encounter (Signed)
Prior auth for Metoprolol 75mg  bid sent to [facility]. The 25mg  strength is on backorder.

## 2015-12-13 NOTE — Telephone Encounter (Signed)
New message       *STAT* If patient is at the pharmacy, call can be transferred to refill team.   1. Which medications need to be refilled? (please list name of each medication and dose if known) metoprolol 25mg  2. Which pharmacy/location (including street and city if local pharmacy) is [facility] [place] 3. Do they need a 30 day or 90 day supply? 30 25mg  is on back order, can pt get metoprolol 75mg ---1 tablet daily

## 2015-12-13 NOTE — Telephone Encounter (Signed)
Called the [facility] pharmacy back. Metoprolol tartrate 25 mg are on back order. There are 75 mg tablets that are available and would like to switch to one 75 mg tablet twice daily. Advised ok to fill as 75 tablets.

## 2016-01-01 ENCOUNTER — Encounter: Payer: Self-pay | Admitting: *Deleted

## 2016-01-03 ENCOUNTER — Ambulatory Visit (INDEPENDENT_AMBULATORY_CARE_PROVIDER_SITE_OTHER): Payer: BLUE CROSS/BLUE SHIELD | Admitting: *Deleted

## 2016-01-03 DIAGNOSIS — Z5181 Encounter for therapeutic drug level monitoring: Secondary | ICD-10-CM

## 2016-01-03 DIAGNOSIS — I4892 Unspecified atrial flutter: Secondary | ICD-10-CM

## 2016-01-03 LAB — POCT INR: INR: 2.2

## 2016-01-31 ENCOUNTER — Ambulatory Visit (INDEPENDENT_AMBULATORY_CARE_PROVIDER_SITE_OTHER): Payer: BLUE CROSS/BLUE SHIELD | Admitting: *Deleted

## 2016-01-31 DIAGNOSIS — Z5181 Encounter for therapeutic drug level monitoring: Secondary | ICD-10-CM

## 2016-01-31 DIAGNOSIS — I4892 Unspecified atrial flutter: Secondary | ICD-10-CM

## 2016-01-31 LAB — POCT INR: INR: 2.8

## 2016-02-28 ENCOUNTER — Ambulatory Visit (INDEPENDENT_AMBULATORY_CARE_PROVIDER_SITE_OTHER): Payer: BLUE CROSS/BLUE SHIELD | Admitting: *Deleted

## 2016-02-28 DIAGNOSIS — I4892 Unspecified atrial flutter: Secondary | ICD-10-CM | POA: Diagnosis not present

## 2016-02-28 DIAGNOSIS — Z5181 Encounter for therapeutic drug level monitoring: Secondary | ICD-10-CM | POA: Diagnosis not present

## 2016-02-28 LAB — POCT INR: INR: 2.1

## 2016-05-03 ENCOUNTER — Other Ambulatory Visit: Payer: Self-pay | Admitting: Internal Medicine

## 2016-05-06 ENCOUNTER — Ambulatory Visit (INDEPENDENT_AMBULATORY_CARE_PROVIDER_SITE_OTHER): Payer: BLUE CROSS/BLUE SHIELD | Admitting: *Deleted

## 2016-05-06 DIAGNOSIS — I4892 Unspecified atrial flutter: Secondary | ICD-10-CM

## 2016-05-06 DIAGNOSIS — Z5181 Encounter for therapeutic drug level monitoring: Secondary | ICD-10-CM

## 2016-05-06 LAB — POCT INR: INR: 2.6

## 2016-05-06 MED ORDER — WARFARIN SODIUM 5 MG PO TABS: ORAL_TABLET | ORAL | 3 refills | Status: DC

## 2016-05-30 ENCOUNTER — Telehealth: Payer: Self-pay | Admitting: Internal Medicine

## 2016-05-30 NOTE — Telephone Encounter (Signed)
New Message  Pt c/o medication issue:  1. Name of Medication:  metoprolol tartrate (lopressor) losartan (cozaar)  2. How are you currently taking this medication (dosage and times per day)?  25 mg twice daily 50 mg tablet daily  3. Are you having a reaction (difficulty breathing--STAT)? No  4. What is your medication issue? Pt voiced he has some questions about size effects  Please f/u with pt

## 2016-05-30 NOTE — Telephone Encounter (Signed)
Patient is experiencing erectile dysfunction and wanted to know if it is a side effect of his medication.  He takes metoprolol 75 mg BID.  Advised it may be the metoprolol and that I would forward to Dr. Harrington Challenger for recommendation.  He will call back if he wants to try a med for ED.  He might purchase an OTC herbal natural remedy that he saw at [facility] called extenze first.

## 2016-06-03 NOTE — Telephone Encounter (Signed)
WOuld cut back to 50 mg am/ 25 mg PM  WIll follow BP Make sure has f/u in March I would not try Extenze  Has yohimbine in it which hasa potential heart side effects Call back if drop in dose doesn't help with ED

## 2016-06-04 MED ORDER — METOPROLOL TARTRATE 25 MG PO TABS: ORAL_TABLET | ORAL | 3 refills | Status: DC

## 2016-06-04 NOTE — Telephone Encounter (Signed)
Spoke with patient regarding reducing dose of metoprolol to 50 mg in AM and 25 mg in PM.  He will start that and monitor his BP and HR and call if readings increase.   Will also call if the decrease does not help his ED. Advised of potential heart side effects of Extenze and that it is not recommended.  Advised he needs f/u appt in March.  Pt is at work and will call back to schedule this.

## 2016-06-17 ENCOUNTER — Ambulatory Visit (INDEPENDENT_AMBULATORY_CARE_PROVIDER_SITE_OTHER): Payer: BLUE CROSS/BLUE SHIELD | Admitting: *Deleted

## 2016-06-17 DIAGNOSIS — Z5181 Encounter for therapeutic drug level monitoring: Secondary | ICD-10-CM | POA: Diagnosis not present

## 2016-06-17 DIAGNOSIS — I4892 Unspecified atrial flutter: Secondary | ICD-10-CM | POA: Diagnosis not present

## 2016-06-17 LAB — POCT INR: INR: 3.4

## 2016-07-01 ENCOUNTER — Other Ambulatory Visit: Payer: Self-pay | Admitting: Internal Medicine

## 2016-07-09 ENCOUNTER — Ambulatory Visit (INDEPENDENT_AMBULATORY_CARE_PROVIDER_SITE_OTHER): Payer: BLUE CROSS/BLUE SHIELD | Admitting: *Deleted

## 2016-07-09 DIAGNOSIS — I4892 Unspecified atrial flutter: Secondary | ICD-10-CM | POA: Diagnosis not present

## 2016-07-09 DIAGNOSIS — Z5181 Encounter for therapeutic drug level monitoring: Secondary | ICD-10-CM | POA: Diagnosis not present

## 2016-07-09 LAB — POCT INR: INR: 2.2

## 2016-08-20 ENCOUNTER — Ambulatory Visit (INDEPENDENT_AMBULATORY_CARE_PROVIDER_SITE_OTHER): Payer: BLUE CROSS/BLUE SHIELD | Admitting: *Deleted

## 2016-08-20 DIAGNOSIS — I4892 Unspecified atrial flutter: Secondary | ICD-10-CM

## 2016-08-20 DIAGNOSIS — Z5181 Encounter for therapeutic drug level monitoring: Secondary | ICD-10-CM | POA: Diagnosis not present

## 2016-08-20 LAB — POCT INR: INR: 2.5

## 2016-10-07 ENCOUNTER — Ambulatory Visit (INDEPENDENT_AMBULATORY_CARE_PROVIDER_SITE_OTHER): Payer: BLUE CROSS/BLUE SHIELD | Admitting: Pharmacist

## 2016-10-07 DIAGNOSIS — I4892 Unspecified atrial flutter: Secondary | ICD-10-CM | POA: Diagnosis not present

## 2016-10-07 DIAGNOSIS — Z5181 Encounter for therapeutic drug level monitoring: Secondary | ICD-10-CM | POA: Diagnosis not present

## 2016-10-07 LAB — POCT INR: INR: 1

## 2016-10-14 ENCOUNTER — Ambulatory Visit (INDEPENDENT_AMBULATORY_CARE_PROVIDER_SITE_OTHER): Payer: BLUE CROSS/BLUE SHIELD | Admitting: Pharmacist

## 2016-10-14 DIAGNOSIS — Z5181 Encounter for therapeutic drug level monitoring: Secondary | ICD-10-CM

## 2016-10-14 DIAGNOSIS — I4892 Unspecified atrial flutter: Secondary | ICD-10-CM | POA: Diagnosis not present

## 2016-10-14 LAB — POCT INR: INR: 2.2

## 2016-10-14 MED ORDER — WARFARIN SODIUM 5 MG PO TABS: ORAL_TABLET | ORAL | 3 refills | Status: DC

## 2016-11-29 ENCOUNTER — Other Ambulatory Visit: Payer: Self-pay | Admitting: Internal Medicine

## 2016-12-20 ENCOUNTER — Telehealth: Payer: Self-pay | Admitting: Internal Medicine

## 2016-12-20 MED ORDER — METOPROLOL TARTRATE 25 MG PO TABS: ORAL_TABLET | ORAL | 0 refills | Status: DC

## 2016-12-20 NOTE — Telephone Encounter (Signed)
New message      *STAT* If patient is at the pharmacy, call can be transferred to refill team.   1. Which medications need to be refilled? (please list name of each medication and dose if known)  metoprolol tartrate (LOPRESSOR) 25 MG tablet Take 50 mg (2 tabs) in AM and 25 mg (1 tab) in PM     2. Which pharmacy/location (including street and city if local pharmacy) is [facility]   3. Do they need a 30 day or 90 day supply? [place]

## 2016-12-31 ENCOUNTER — Encounter (INDEPENDENT_AMBULATORY_CARE_PROVIDER_SITE_OTHER): Payer: Self-pay

## 2016-12-31 ENCOUNTER — Ambulatory Visit (INDEPENDENT_AMBULATORY_CARE_PROVIDER_SITE_OTHER): Payer: BLUE CROSS/BLUE SHIELD | Admitting: Physician Assistant

## 2016-12-31 ENCOUNTER — Encounter: Payer: Self-pay | Admitting: Physician Assistant

## 2016-12-31 VITALS — BP 126/80 | HR 72 | Ht 74.5 in | Wt 233.3 lb

## 2016-12-31 DIAGNOSIS — Q203 Discordant ventriculoarterial connection: Secondary | ICD-10-CM | POA: Diagnosis not present

## 2016-12-31 DIAGNOSIS — I10 Essential (primary) hypertension: Secondary | ICD-10-CM

## 2016-12-31 DIAGNOSIS — R059 Cough, unspecified: Secondary | ICD-10-CM

## 2016-12-31 DIAGNOSIS — I4892 Unspecified atrial flutter: Secondary | ICD-10-CM

## 2016-12-31 DIAGNOSIS — R05 Cough: Secondary | ICD-10-CM

## 2016-12-31 MED ORDER — LOSARTAN POTASSIUM 50 MG PO TABS: 50.0000 mg | ORAL_TABLET | Freq: Every day | ORAL | 3 refills | Status: DC

## 2016-12-31 MED ORDER — METOPROLOL TARTRATE 25 MG PO TABS: ORAL_TABLET | ORAL | 3 refills | Status: DC

## 2016-12-31 NOTE — Progress Notes (Signed)
Cardiology Office Note    Date:  12/31/2016   ID:  Matthew [name], DOB Mar 30, 1976, MRN [id number]  PCP:  Mikey Kirschner, MD  Cardiologist:  Dr. Harrington Challenger EP: Dr. Lovena Le  Chief Complaint: HTN/ congenital heart disease follow up  History of Present Illness:   Matthew [name] is a 41 y.o. male with a history of atypical aflutter on coumadin, hypertension, transposition of great vessels s/p Mustard procedure in 1980, RMSF and alcohol abuse/DTs  presents for follow up.   He has been evaluated by EP (Dr. Lovena Le) in 2015 who had arranged for him to undergo radiofrequency ablation at [facility]. Due to running out of insurance, he was initially unable to proceed with this plan. He has since gotten a job and has insurance again.  He was doing well on cardiac stand point when last seen by Dr. Harrington Challenger 10/13/15. Event monitor 4/17 showed sinus rhythm with occasional PACs. Few short burst of PAT.   Metoprolol reduced to 50mg  AM and 25mg  PM due to ED in 05/2016.   He is here for follow-up. He recently noted cough early in morning when he wakes up. Noted some blood tinges sputum. Suspects about his window [facility] unit. His partner also has similar symptoms. He denies any fever, chills, congestion, chest pain, palpitation, shortness of breath, orthopnea, PND, syncope, dizziness, lower extremity edema, melena or blood in his stool or urine.  Past Medical History:  Diagnosis Date  . Alcohol abuse   . Anxiety   . Atypical atrial flutter ([place])   . Complete transposition of great vessels   . Depression   . Dyslipidemia   . GERD (gastroesophageal reflux disease)   . Headache    "weekly" (09/24/2014)  . Heart murmur   . Hypertension   . 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 SVT (supraventricular tachycardia) ([place]) "several times in the last year" (09/24/2014)  . 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 FINGER ARTHROPLASTY  Right    volar plate arthroplasty, right small finger proxima linterphalangeal joint [Other]  . FRACTURE SURGERY    . SEPTOSTOMY     Rashkind balloon atrial septostomy   . TRANSPOSITION OF GREAT VESSELS REPAIR  1980   "mustard procedure"    Current Medications: Prior to Admission medications   Medication Sig Start Date End Date Taking? Authorizing Provider  losartan (COZAAR) 50 MG tablet TAKE 1 TABLET BY MOUTH ONCE DAILY 12/02/16   Fay Records, MD  metoprolol tartrate (LOPRESSOR) 25 MG tablet Take 50 mg (2 tabs) by mouth in AM and 25 mg (1 tab) by mouth in PM. PT NEEDS APPT, CALL [phone] TO SCHEDULE, 1ST ATTEMPT 12/20/16   Fay Records, MD  warfarin (COUMADIN) 5 MG tablet Take as directed by Coumadin Clinic 10/14/16   Fay Records, MD    Allergies:   Aspirin   Social History   Social History  . Marital status: Legally Separated    Spouse name: N/A  . Number of children: N/A  . Years of education: N/A   Social History Main Topics  . Smoking status: Never Smoker  . Smokeless tobacco: Current User    Types: Snuff     Comment: "dipped when I played softball; none since 2011"  . Alcohol use 36.0 oz/week    60 Shots of liquor per week     Comment: 09/24/2014 "1/5th vodka in 2 days;  easy"  . Drug use: No  . Sexual activity: Yes   Other Topics Concern  . None   Social History Narrative  . None     Family History:  The patient's family history includes Hypertension in his father; Rheumatic fever in his father.   ROS:   Please see the history of present illness.    ROS All other systems reviewed and are negative.   PHYSICAL EXAM:   VS:  BP 126/80   Pulse 72   Ht 6' 2.5" (1.892 m)   Wt 233 lb 4.8 oz (105.8 kg)   BMI 29.55 kg/m    GEN: Well nourished, well developed, in no acute distress  HEENT: normal  Neck: no JVD, carotid bruits, or masses Cardiac: RRR; no murmurs, rubs, or gallops,no edema  Respiratory:  clear to auscultation bilaterally, normal work of  breathing GI: soft, nontender, nondistended, + BS MS: no deformity or atrophy  Skin: warm and dry, no rash Neuro:  Alert and Oriented x 3, Strength and sensation are intact Psych: euthymic mood, full affect  Wt Readings from Last 3 Encounters:  12/31/16 233 lb 4.8 oz (105.8 kg)  10/13/15 212 lb 12.8 oz (96.5 kg)  07/28/15 210 lb 6.4 oz (95.4 kg)      Studies/Labs Reviewed:   EKG:  EKG is not ordered today.    Recent Labs: No results found for requested labs within last 8760 hours.   Lipid Panel    Component Value Date/Time   CHOL 160 10/13/2015 0948   TRIG 49 10/13/2015 0948   HDL 39 (L) 10/13/2015 0948   CHOLHDL 4.1 10/13/2015 0948   VLDL 10 10/13/2015 0948   LDLCALC 111 10/13/2015 0948   LDLDIRECT 162.4 08/10/2012 1143    Additional studies/ records that were reviewed today include:   Echocardiogram: 08/21/15 Study Conclusions  - Impressions: Poor quality images. Patient should be done by   pediatric echo tech in future. Apparantly s/p Mustard procedure   with atrial baffle and d transposition   The systemic ventrical ( RV) is trabeculated and diffusely   hypokinetic with EF in the 35% range. The is mild systemic AV   valve regurgitation. and moderate non sysemic AV valve   regurgitation. The atrial baffles are not well seen and I cannot   comment on any obstruction. Consider referral to [facility] for cardiac   MRI.  Impressions:  - Poor quality images. Patient should be done by pediatric echo   tech in future. Apparantly s/p Mustard procedure with atrial   baffle and d transposition   The systemic ventrical ( RV) is trabeculated and diffusely   hypokinetic with EF in the 35% range. The is mild systemic AV   valve regurgitation. and moderate non sysemic AV valve   regurgitation. The atrial baffles are not well seen and I cannot   comment on any obstruction. Consider referral to [facility] for cardiac   MRI.   ASSESSMENT & PLAN:    1. SVT/ Atrial flutter -  Continue BB and coumadin. No reoccurrence since stopped drinking 2 years ago. Might consider discontinuation of coumadin during follow-up.  2. TGV s/p mustard procedure - Last echo 08/2016 technically difficult study. Recommended study by pediatric echo tech in future vs referral to [facility] for cardiac   MRI. - Asymptomatic currently.  3. HTN - Stable and well controlled on current regimen  4. Cough with intermittent blood tinges sputum - No fever or chills. Likely related to window [facility] unit. Advised  to follow-up with PCP if no improvement.   Medication Adjustments/Labs and Tests Ordered: Current medicines are reviewed at length with the patient today.  Concerns regarding medicines are outlined above.  Medication changes, Labs and Tests ordered today are listed in the Patient Instructions below. Patient Instructions  Medication Instructions:  Your physician recommends that you continue on your current medications as directed. Please refer to the Current Medication list given to you today.  Labwork: NONE  Testing/Procedures: NONE  Follow-Up: Your physician wants you to follow-up in: 6 months with Dr. Harrington Challenger. You will receive a reminder letter in the mail two months in advance. If you don't receive a letter, please call our office to schedule the follow-up appointment.   If you need a refill on your cardiac medications before your next appointment, please call your pharmacy.       Jarrett Soho, [place]  12/31/2016 3:31 PM    [facility] [place], [place], [place]  [address] Phone: [phone]; Fax: [phone]

## 2016-12-31 NOTE — Patient Instructions (Signed)
Medication Instructions:  Your physician recommends that you continue on your current medications as directed. Please refer to the Current Medication list given to you today.  Labwork: NONE  Testing/Procedures: NONE  Follow-Up: Your physician wants you to follow-up in: 6 months with Dr. Ross. You will receive a reminder letter in the mail two months in advance. If you don't receive a letter, please call our office to schedule the follow-up appointment.   If you need a refill on your cardiac medications before your next appointment, please call your pharmacy.    

## 2017-01-21 ENCOUNTER — Other Ambulatory Visit: Payer: Self-pay | Admitting: Internal Medicine

## 2017-01-21 NOTE — Telephone Encounter (Signed)
Medication Detail    Disp Refills Start End   metoprolol tartrate (LOPRESSOR) 25 MG tablet 270 tablet 3 12/31/2016    Sig: Take 50 mg (2 tabs) by mouth in AM and 25 mg (1 tab) by mouth in PM.   E-Prescribing Status: Receipt confirmed by pharmacy (12/31/2016 3:30 PM EDT)   Pharmacy   [place], [id number]

## 2017-09-18 ENCOUNTER — Encounter: Payer: Self-pay | Admitting: Family Medicine

## 2017-09-18 ENCOUNTER — Ambulatory Visit: Payer: BLUE CROSS/BLUE SHIELD | Admitting: Family Medicine

## 2017-09-18 VITALS — BP 130/88 | Ht 74.5 in | Wt 230.2 lb

## 2017-09-18 DIAGNOSIS — F411 Generalized anxiety disorder: Secondary | ICD-10-CM | POA: Diagnosis not present

## 2017-09-18 DIAGNOSIS — F329 Major depressive disorder, single episode, unspecified: Secondary | ICD-10-CM

## 2017-09-18 DIAGNOSIS — M722 Plantar fascial fibromatosis: Secondary | ICD-10-CM | POA: Diagnosis not present

## 2017-09-18 DIAGNOSIS — F32A Depression, unspecified: Secondary | ICD-10-CM

## 2017-09-18 MED ORDER — ESCITALOPRAM OXALATE 10 MG PO TABS: 10.0000 mg | ORAL_TABLET | Freq: Every day | ORAL | 5 refills | Status: DC

## 2017-09-18 NOTE — Patient Instructions (Signed)

## 2017-09-18 NOTE — Progress Notes (Signed)
   Subjective:    Patient ID: Matthew [name], male    DOB: December 13, 1975, 42 y.o.   MRN: [id number]  Depression         This is a new problem.  The current episode started 1 to 4 weeks ago.   Associated symptoms include fatigue and restlessness.   Pt states he is a single dad with an 13 year old and a 61 year old set of twins; divorced for 2 years. States he worries about little things and when he get "down" he just wants to lie around  Hx of divorcd nd seeration two yrs ago  Feeling down , pt feels lonely  Pt has hx of alcohol abuse, has slipped up a little past couple mo  wnt to rehab in the past  Does not want to go down that road again  Took generic lexapro, helped pt, was on that aftr rehab, went to a place in [place]  Just joined   Kids are split between the family 88 5      . States his legs have been hurting and he may need to get back on Coumadin. States they may just  Hurt from lying down.    Review of Systems  Constitutional: Positive for fatigue.  Psychiatric/Behavioral: Positive for depression.       Objective:   Physical Exam  Alert and oriented, vitals reviewed and stable, NAD ENT-TM's and ext canals WNL bilat via otoscopic exam Soft palate, tonsils and post pharynx WNL via oropharyngeal exam Neck-symmetric, no masses; thyroid nonpalpable and nontender Pulmonary-no tachypnea or accessory muscle use; Clear without wheezes via auscultation Card--no abnrml murmurs, rhythm reg and rate WNL Carotid pulses symmetric, without bruits      Assessment & Plan:   Impression insomnia.  On further discussion substantial issues.  Feeling down often.  No suicidal thoughts.  Under a lot of stress.  Continue weightbearing disagreement with his ex-wife over their children.  They share custody.  Patient reports long-standing history of alcohol abuse.  Recently got back in alcohol use.  Fortunately he has reached out to [facility] and plans to start meetings.  Also feels he  could benefit from some counseling.  No addition to that took Lexapro in the past and it definitely helped him.  Planning to start exercising soon.  Patient also presents today with plantar fasciitis local management discussed  Recheck in 3 months.  Lexapro initiated side effects benefits discussed foot management for chronic fasciitis discussed.  We will work on psychology referral

## 2017-09-24 ENCOUNTER — Encounter: Payer: Self-pay | Admitting: Family Medicine

## 2017-09-29 ENCOUNTER — Emergency Department (HOSPITAL_COMMUNITY): Payer: BLUE CROSS/BLUE SHIELD

## 2017-09-29 ENCOUNTER — Encounter (HOSPITAL_COMMUNITY): Payer: Self-pay | Admitting: Emergency Medicine

## 2017-09-29 ENCOUNTER — Inpatient Hospital Stay (HOSPITAL_COMMUNITY): Payer: BLUE CROSS/BLUE SHIELD

## 2017-09-29 ENCOUNTER — Inpatient Hospital Stay (HOSPITAL_COMMUNITY)
Admission: EM | Admit: 2017-09-29 | Discharge: 2017-10-03 | DRG: 640 | Disposition: A | Discharge status: Home Health | Payer: BLUE CROSS/BLUE SHIELD | Attending: Internal Medicine | Admitting: Internal Medicine

## 2017-09-29 ENCOUNTER — Other Ambulatory Visit: Payer: Self-pay

## 2017-09-29 DIAGNOSIS — I4892 Unspecified atrial flutter: Secondary | ICD-10-CM | POA: Diagnosis present

## 2017-09-29 DIAGNOSIS — F331 Major depressive disorder, recurrent, moderate: Secondary | ICD-10-CM | POA: Diagnosis not present

## 2017-09-29 DIAGNOSIS — Q203 Discordant ventriculoarterial connection: Secondary | ICD-10-CM | POA: Diagnosis not present

## 2017-09-29 DIAGNOSIS — Z79899 Other long term (current) drug therapy: Secondary | ICD-10-CM | POA: Diagnosis not present

## 2017-09-29 DIAGNOSIS — Z8249 Family history of ischemic heart disease and other diseases of the circulatory system: Secondary | ICD-10-CM

## 2017-09-29 DIAGNOSIS — E876 Hypokalemia: Secondary | ICD-10-CM | POA: Diagnosis present

## 2017-09-29 DIAGNOSIS — E43 Unspecified severe protein-calorie malnutrition: Secondary | ICD-10-CM | POA: Diagnosis present

## 2017-09-29 DIAGNOSIS — R112 Nausea with vomiting, unspecified: Secondary | ICD-10-CM | POA: Diagnosis present

## 2017-09-29 DIAGNOSIS — I10 Essential (primary) hypertension: Secondary | ICD-10-CM | POA: Diagnosis not present

## 2017-09-29 DIAGNOSIS — F411 Generalized anxiety disorder: Secondary | ICD-10-CM | POA: Diagnosis present

## 2017-09-29 DIAGNOSIS — E860 Dehydration: Secondary | ICD-10-CM | POA: Diagnosis present

## 2017-09-29 DIAGNOSIS — R079 Chest pain, unspecified: Secondary | ICD-10-CM

## 2017-09-29 DIAGNOSIS — I272 Pulmonary hypertension, unspecified: Secondary | ICD-10-CM | POA: Diagnosis present

## 2017-09-29 DIAGNOSIS — F10239 Alcohol dependence with withdrawal, unspecified: Secondary | ICD-10-CM | POA: Diagnosis present

## 2017-09-29 DIAGNOSIS — F17290 Nicotine dependence, other tobacco product, uncomplicated: Secondary | ICD-10-CM | POA: Diagnosis present

## 2017-09-29 DIAGNOSIS — R7401 Elevation of levels of liver transaminase levels: Secondary | ICD-10-CM | POA: Diagnosis present

## 2017-09-29 DIAGNOSIS — I5022 Chronic systolic (congestive) heart failure: Secondary | ICD-10-CM | POA: Diagnosis present

## 2017-09-29 DIAGNOSIS — Z886 Allergy status to analgesic agent status: Secondary | ICD-10-CM | POA: Diagnosis not present

## 2017-09-29 DIAGNOSIS — E861 Hypovolemia: Secondary | ICD-10-CM | POA: Diagnosis present

## 2017-09-29 DIAGNOSIS — R740 Nonspecific elevation of levels of transaminase and lactic acid dehydrogenase [LDH]: Secondary | ICD-10-CM | POA: Diagnosis not present

## 2017-09-29 DIAGNOSIS — K219 Gastro-esophageal reflux disease without esophagitis: Secondary | ICD-10-CM | POA: Diagnosis present

## 2017-09-29 DIAGNOSIS — R197 Diarrhea, unspecified: Secondary | ICD-10-CM

## 2017-09-29 DIAGNOSIS — F329 Major depressive disorder, single episode, unspecified: Secondary | ICD-10-CM | POA: Diagnosis present

## 2017-09-29 DIAGNOSIS — R000 Tachycardia, unspecified: Secondary | ICD-10-CM | POA: Diagnosis present

## 2017-09-29 DIAGNOSIS — F32A Depression, unspecified: Secondary | ICD-10-CM | POA: Diagnosis present

## 2017-09-29 DIAGNOSIS — Z8774 Personal history of (corrected) congenital malformations of heart and circulatory system: Secondary | ICD-10-CM

## 2017-09-29 DIAGNOSIS — I471 Supraventricular tachycardia: Secondary | ICD-10-CM | POA: Diagnosis present

## 2017-09-29 DIAGNOSIS — F1010 Alcohol abuse, uncomplicated: Secondary | ICD-10-CM | POA: Diagnosis not present

## 2017-09-29 DIAGNOSIS — E785 Hyperlipidemia, unspecified: Secondary | ICD-10-CM | POA: Diagnosis present

## 2017-09-29 DIAGNOSIS — I110 Hypertensive heart disease with heart failure: Secondary | ICD-10-CM | POA: Diagnosis present

## 2017-09-29 DIAGNOSIS — Z6829 Body mass index (BMI) 29.0-29.9, adult: Secondary | ICD-10-CM | POA: Diagnosis not present

## 2017-09-29 DIAGNOSIS — E871 Hypo-osmolality and hyponatremia: Secondary | ICD-10-CM | POA: Diagnosis not present

## 2017-09-29 DIAGNOSIS — I484 Atypical atrial flutter: Secondary | ICD-10-CM | POA: Diagnosis present

## 2017-09-29 DIAGNOSIS — R9431 Abnormal electrocardiogram [ECG] [EKG]: Secondary | ICD-10-CM

## 2017-09-29 DIAGNOSIS — F10939 Alcohol use, unspecified with withdrawal, unspecified: Secondary | ICD-10-CM | POA: Diagnosis present

## 2017-09-29 DIAGNOSIS — I4891 Unspecified atrial fibrillation: Secondary | ICD-10-CM | POA: Diagnosis present

## 2017-09-29 DIAGNOSIS — D72829 Elevated white blood cell count, unspecified: Secondary | ICD-10-CM | POA: Diagnosis present

## 2017-09-29 LAB — CBC
HCT: 45.4 % (ref 39.0–52.0)
Hemoglobin: 16.3 g/dL (ref 13.0–17.0)
MCH: 35.5 pg — ABNORMAL HIGH (ref 26.0–34.0)
MCHC: 35.9 g/dL (ref 30.0–36.0)
MCV: 98.9 fL (ref 78.0–100.0)
Platelets: 385 10*3/uL (ref 150–400)
RBC: 4.59 MIL/uL (ref 4.22–5.81)
RDW: 13.3 % (ref 11.5–15.5)
WBC: 13.9 10*3/uL — ABNORMAL HIGH (ref 4.0–10.5)

## 2017-09-29 LAB — COMPREHENSIVE METABOLIC PANEL
ALT: 81 U/L — ABNORMAL HIGH (ref 17–63)
AST: 115 U/L — ABNORMAL HIGH (ref 15–41)
Albumin: 3.3 g/dL — ABNORMAL LOW (ref 3.5–5.0)
Alkaline Phosphatase: 89 U/L (ref 38–126)
Anion gap: <20 — ABNORMAL HIGH (ref 5–15)
BUN: 13 mg/dL (ref 6–20)
CO2: 30 mmol/L (ref 22–32)
Calcium: 7.5 mg/dL — ABNORMAL LOW (ref 8.9–10.3)
Chloride: 71 mmol/L — ABNORMAL LOW (ref 101–111)
Creatinine, Ser: 0.6 mg/dL — ABNORMAL LOW (ref 0.61–1.24)
GFR calc Af Amer: >60 mL/min (ref >60)
GFR calc non Af Amer: >60 mL/min (ref >60)
Glucose, Bld: 143 mg/dL — ABNORMAL HIGH (ref 65–99)
Potassium: 2.5 mmol/L — CL (ref 3.5–5.1)
Sodium: 125 mmol/L — ABNORMAL LOW (ref 135–145)
Total Bilirubin: 2.1 mg/dL — ABNORMAL HIGH (ref 0.3–1.2)
Total Protein: 6.7 g/dL (ref 6.5–8.1)

## 2017-09-29 LAB — RAPID URINE DRUG SCREEN, HOSP PERFORMED
Amphetamines: NOT DETECTED
Barbiturates: NOT DETECTED
Benzodiazepines: NOT DETECTED
Cocaine: NOT DETECTED
Opiates: NOT DETECTED
Tetrahydrocannabinol: NOT DETECTED

## 2017-09-29 LAB — TROPONIN I
Troponin I: <0.03 ng/mL (ref <0.03)
Troponin I: <0.03 ng/mL (ref <0.03)
Troponin I: <0.03 ng/mL (ref <0.03)

## 2017-09-29 LAB — BRAIN NATRIURETIC PEPTIDE: B Natriuretic Peptide: 50 pg/mL (ref 0.0–100.0)

## 2017-09-29 LAB — LIPASE, BLOOD: Lipase: 37 U/L (ref 11–51)

## 2017-09-29 LAB — MAGNESIUM: Magnesium: 0.8 mg/dL — CL (ref 1.7–2.4)

## 2017-09-29 LAB — MRSA PCR SCREENING: MRSA by PCR: POSITIVE — AB

## 2017-09-29 LAB — ETHANOL: Alcohol, Ethyl (B): 80 mg/dL — ABNORMAL HIGH (ref <10)

## 2017-09-29 MED ORDER — GI COCKTAIL ~~LOC~~
30.0000 mL | Freq: Once | ORAL | Status: AC
  Administered 2017-09-29: 30 mL via ORAL
  Filled 2017-09-29: qty 30

## 2017-09-29 MED ORDER — LORAZEPAM 2 MG/ML IJ SOLN
1.0000 mg | INTRAMUSCULAR | Status: DC | PRN
  Administered 2017-09-30 – 2017-10-03 (×3): 1 mg via INTRAVENOUS
  Filled 2017-09-29 (×3): qty 1

## 2017-09-29 MED ORDER — METOPROLOL TARTRATE 50 MG PO TABS
50.0000 mg | ORAL_TABLET | Freq: Every day | ORAL | Status: DC
  Administered 2017-09-30 – 2017-10-03 (×4): 50 mg via ORAL
  Filled 2017-09-29 (×4): qty 1

## 2017-09-29 MED ORDER — POTASSIUM CHLORIDE 10 MEQ/100ML IV SOLN
10.0000 meq | INTRAVENOUS | Status: AC
  Administered 2017-09-29 (×3): 10 meq via INTRAVENOUS
  Filled 2017-09-29 (×3): qty 100

## 2017-09-29 MED ORDER — LORAZEPAM 2 MG/ML IJ SOLN
0.0000 mg | Freq: Two times a day (BID) | INTRAMUSCULAR | Status: DC
  Administered 2017-10-03: 2 mg via INTRAVENOUS
  Filled 2017-09-29: qty 1

## 2017-09-29 MED ORDER — LORAZEPAM 1 MG PO TABS: 1.0000 mg | ORAL_TABLET | Freq: Four times a day (QID) | ORAL | Status: DC | PRN

## 2017-09-29 MED ORDER — THIAMINE HCL 100 MG/ML IJ SOLN
100.0000 mg | Freq: Every day | INTRAMUSCULAR | Status: DC
  Administered 2017-09-29: 100 mg via INTRAVENOUS
  Filled 2017-09-29: qty 2

## 2017-09-29 MED ORDER — LORAZEPAM 2 MG/ML IJ SOLN
0.0000 mg | INTRAMUSCULAR | Status: AC
  Administered 2017-09-29 (×2): 4 mg via INTRAVENOUS
  Administered 2017-09-30 (×2): 2 mg via INTRAVENOUS
  Administered 2017-09-30: 1 mg via INTRAVENOUS
  Administered 2017-09-30: 4 mg via INTRAVENOUS
  Administered 2017-09-30: 1 mg via INTRAVENOUS
  Administered 2017-09-30: 4 mg via INTRAVENOUS
  Administered 2017-10-01: 2 mg via INTRAVENOUS
  Administered 2017-10-01: 1 mg via INTRAVENOUS
  Filled 2017-09-29: qty 2
  Filled 2017-09-29 (×2): qty 1
  Filled 2017-09-29 (×2): qty 2
  Filled 2017-09-29 (×3): qty 1
  Filled 2017-09-29: qty 2
  Filled 2017-09-29: qty 1

## 2017-09-29 MED ORDER — LORAZEPAM 2 MG/ML IJ SOLN: 0.0000 mg | Freq: Two times a day (BID) | INTRAMUSCULAR | Status: DC

## 2017-09-29 MED ORDER — SODIUM CHLORIDE 0.9 % IV SOLN: 1.0000 g | Freq: Once | INTRAVENOUS | Status: DC

## 2017-09-29 MED ORDER — FOLIC ACID 1 MG PO TABS
1.0000 mg | ORAL_TABLET | Freq: Every day | ORAL | Status: DC
  Administered 2017-09-29 – 2017-10-03 (×5): 1 mg via ORAL
  Filled 2017-09-29 (×5): qty 1

## 2017-09-29 MED ORDER — ONDANSETRON HCL 4 MG/2ML IJ SOLN
4.0000 mg | Freq: Once | INTRAMUSCULAR | Status: AC
  Administered 2017-09-29: 4 mg via INTRAVENOUS
  Filled 2017-09-29: qty 2

## 2017-09-29 MED ORDER — LORAZEPAM 2 MG/ML IJ SOLN
1.5000 mg | Freq: Once | INTRAMUSCULAR | Status: DC
  Administered 2017-09-29: 1.5 mg via INTRAVENOUS
  Filled 2017-09-29: qty 1

## 2017-09-29 MED ORDER — LORAZEPAM 2 MG/ML IJ SOLN: 1.0000 mg | Freq: Four times a day (QID) | INTRAMUSCULAR | Status: DC | PRN

## 2017-09-29 MED ORDER — METOPROLOL TARTRATE 25 MG PO TABS
25.0000 mg | ORAL_TABLET | Freq: Every day | ORAL | Status: DC
  Administered 2017-09-29 – 2017-10-02 (×4): 25 mg via ORAL
  Filled 2017-09-29 (×5): qty 1

## 2017-09-29 MED ORDER — ONDANSETRON HCL 4 MG PO TABS: 4.0000 mg | ORAL_TABLET | Freq: Four times a day (QID) | ORAL | Status: DC | PRN

## 2017-09-29 MED ORDER — LORAZEPAM 1 MG PO TABS: 0.0000 mg | ORAL_TABLET | Freq: Four times a day (QID) | ORAL | Status: DC

## 2017-09-29 MED ORDER — SODIUM CHLORIDE 0.9 % IV SOLN
2.0000 g | Freq: Once | INTRAVENOUS | Status: AC
  Administered 2017-09-29: 2 g via INTRAVENOUS
  Filled 2017-09-29: qty 20

## 2017-09-29 MED ORDER — IBUPROFEN 400 MG PO TABS
400.0000 mg | ORAL_TABLET | Freq: Four times a day (QID) | ORAL | Status: DC | PRN
  Administered 2017-09-29: 400 mg via ORAL
  Filled 2017-09-29: qty 1

## 2017-09-29 MED ORDER — LORAZEPAM 2 MG/ML IJ SOLN
1.0000 mg | Freq: Once | INTRAMUSCULAR | Status: AC
  Administered 2017-09-29: 1 mg via INTRAVENOUS
  Filled 2017-09-29: qty 1

## 2017-09-29 MED ORDER — PANTOPRAZOLE SODIUM 40 MG PO TBEC
40.0000 mg | DELAYED_RELEASE_TABLET | Freq: Two times a day (BID) | ORAL | Status: DC
  Administered 2017-09-29 – 2017-10-03 (×8): 40 mg via ORAL
  Filled 2017-09-29 (×8): qty 1

## 2017-09-29 MED ORDER — POTASSIUM CHLORIDE IN NACL 40-0.9 MEQ/L-% IV SOLN
INTRAVENOUS | Status: DC
  Administered 2017-09-29 – 2017-09-30 (×2): 75 mL/h via INTRAVENOUS
  Administered 2017-09-30: 60 mL/h via INTRAVENOUS
  Administered 2017-10-01: 50 mL/h via INTRAVENOUS
  Filled 2017-09-29 (×2): qty 1000

## 2017-09-29 MED ORDER — SODIUM CHLORIDE 0.9 % IV BOLUS (SEPSIS)
500.0000 mL | Freq: Once | INTRAVENOUS | Status: AC
Start: 2017-09-29 — End: 2017-09-29
  Administered 2017-09-29: 500 mL via INTRAVENOUS

## 2017-09-29 MED ORDER — LORAZEPAM 1 MG PO TABS
1.0000 mg | ORAL_TABLET | ORAL | Status: DC | PRN
  Administered 2017-10-02 – 2017-10-03 (×3): 1 mg via ORAL
  Filled 2017-09-29 (×3): qty 1

## 2017-09-29 MED ORDER — MAGNESIUM SULFATE 4 GM/100ML IV SOLN
4.0000 g | Freq: Once | INTRAVENOUS | Status: AC
  Administered 2017-09-29: 4 g via INTRAVENOUS
  Filled 2017-09-29: qty 100

## 2017-09-29 MED ORDER — ENOXAPARIN SODIUM 40 MG/0.4ML ~~LOC~~ SOLN
40.0000 mg | SUBCUTANEOUS | Status: DC
  Administered 2017-09-29: 40 mg via SUBCUTANEOUS
  Filled 2017-09-29: qty 0.4

## 2017-09-29 MED ORDER — VITAMIN B-1 100 MG PO TABS
100.0000 mg | ORAL_TABLET | Freq: Every day | ORAL | Status: DC
  Administered 2017-09-30 – 2017-10-03 (×4): 100 mg via ORAL
  Filled 2017-09-29 (×4): qty 1

## 2017-09-29 MED ORDER — LOSARTAN POTASSIUM 50 MG PO TABS
50.0000 mg | ORAL_TABLET | Freq: Every day | ORAL | Status: DC
  Administered 2017-09-30 – 2017-10-03 (×4): 50 mg via ORAL
  Filled 2017-09-29 (×4): qty 1

## 2017-09-29 MED ORDER — ESCITALOPRAM OXALATE 10 MG PO TABS
10.0000 mg | ORAL_TABLET | Freq: Every day | ORAL | Status: DC
  Administered 2017-09-29 – 2017-10-03 (×5): 10 mg via ORAL
  Filled 2017-09-29 (×8): qty 1

## 2017-09-29 MED ORDER — MUPIROCIN 2 % EX OINT
1.0000 "application " | TOPICAL_OINTMENT | Freq: Two times a day (BID) | CUTANEOUS | Status: DC
  Administered 2017-09-29 – 2017-10-03 (×7): 1 via NASAL
  Filled 2017-09-29 (×2): qty 22

## 2017-09-29 MED ORDER — VITAMIN B-1 100 MG PO TABS: 100.0000 mg | ORAL_TABLET | Freq: Every day | ORAL | Status: DC

## 2017-09-29 MED ORDER — ONDANSETRON HCL 4 MG/2ML IJ SOLN
4.0000 mg | Freq: Four times a day (QID) | INTRAMUSCULAR | Status: DC | PRN
  Administered 2017-09-29 – 2017-09-30 (×3): 4 mg via INTRAVENOUS
  Filled 2017-09-29 (×3): qty 2

## 2017-09-29 MED ORDER — TRAZODONE HCL 50 MG PO TABS
25.0000 mg | ORAL_TABLET | Freq: Every evening | ORAL | Status: DC | PRN
  Administered 2017-09-29 – 2017-10-02 (×2): 25 mg via ORAL
  Filled 2017-09-29 (×2): qty 1

## 2017-09-29 MED ORDER — THIAMINE HCL 100 MG/ML IJ SOLN: 100.0000 mg | Freq: Every day | INTRAMUSCULAR | Status: DC

## 2017-09-29 MED ORDER — LORAZEPAM 2 MG/ML IJ SOLN
0.0000 mg | Freq: Four times a day (QID) | INTRAMUSCULAR | Status: DC
  Administered 2017-09-29: 1 mg via INTRAVENOUS
  Filled 2017-09-29: qty 1

## 2017-09-29 MED ORDER — ADULT MULTIVITAMIN W/MINERALS CH
1.0000 | ORAL_TABLET | Freq: Every day | ORAL | Status: DC
  Administered 2017-09-30 – 2017-10-03 (×4): 1 via ORAL
  Filled 2017-09-29 (×4): qty 1

## 2017-09-29 MED ORDER — CHLORHEXIDINE GLUCONATE CLOTH 2 % EX PADS
6.0000 | MEDICATED_PAD | Freq: Every day | CUTANEOUS | Status: DC
  Administered 2017-09-30 – 2017-10-02 (×3): 6 via TOPICAL

## 2017-09-29 MED ORDER — LORAZEPAM 1 MG PO TABS: 0.0000 mg | ORAL_TABLET | Freq: Two times a day (BID) | ORAL | Status: DC

## 2017-09-29 MED ORDER — SODIUM CHLORIDE 0.9 % IV BOLUS (SEPSIS)
1000.0000 mL | Freq: Once | INTRAVENOUS | Status: AC
  Administered 2017-09-29: 1000 mL via INTRAVENOUS

## 2017-09-29 MED ORDER — LORAZEPAM 2 MG/ML IJ SOLN: 0.0000 mg | Freq: Four times a day (QID) | INTRAMUSCULAR | Status: DC

## 2017-09-29 NOTE — ED Provider Notes (Signed)
[facility] EMERGENCY DEPARTMENT Provider Note   CSN: [id number] Arrival date & time: 09/29/17  1032     History   Chief Complaint Chief Complaint  Patient presents with  . Chest Pain    HPI Matthew [name] is a 42 y.o. male.  HPI Patient status post mustard repair for transposition of the great vessels, history of atrial flutter, alcoholism, depression, CHF and [facility] spot fever.  Presents with left-sided chest pain that started at 3 AM this morning.  Pain did not radiate.  Associated with mild shortness of breath.  Patient states his chest pain is now resolved.  States that over the last month he has had relapse of his alcoholism.  He has been drinking daily.  States he drank 1/5 of liquor on Saturday and has had vomiting and diarrhea since.  Multiple episodes of vomiting overnight.  Complains of intermittent left upper abdominal pain.  Denies melanotic or grossly bloody stools.  Has diaphoresis but no known fever or chills.  No new lower extremity swelling or pain.  Patient states he is not been taking his Coumadin for the past month.  Last drank a beer 2 hours before arrival.  Has vomited while here in the emergency department. Past Medical History:  Diagnosis Date  . Alcohol abuse   . Anxiety   . Atypical atrial flutter ([place])   . Complete transposition of great vessels   . Depression   . Dyslipidemia   . GERD (gastroesophageal reflux disease)   . Headache    "weekly" (09/24/2014)  . Heart murmur   . Hypertension   . 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 SVT (supraventricular tachycardia) ([place]) "several times in the last year" (09/24/2014)  . 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 Hyponatremia 09/29/2017  . Transposition of great vessel 09/29/2017  . Dyslipidemia 09/29/2017  . GERD (gastroesophageal reflux disease) 09/29/2017  . Generalized anxiety disorder 09/18/2017  .  Atrial flutter with rapid ventricular response ([place]) 09/23/2014  . Chronic systolic congestive heart failure ([place])   . Tachycardia 07/28/2014  . SVT (supraventricular tachycardia) ([place])   . Hypocalcemia   . Transaminitis   . A-fib ([place]) 04/21/2014  . Encounter for therapeutic drug monitoring 01/07/2014  . Hallucination 01/04/2014  . Transposition of great vessels 01/01/2014  . Chronic systolic CHF (congestive heart failure) ([place]) 01/01/2014  . Pulmonary hypertension ([place]) 12/31/2013  . Metabolic acidosis 44/07/270  . Protein-calorie malnutrition, severe ([place]) 12/30/2013  . Nausea vomiting and diarrhea 12/29/2013  . Atrial flutter ([place]) 12/29/2013  . Hypokalemia 12/29/2013  . Hypomagnesemia 12/29/2013  . Alcohol abuse 12/29/2013  . Alcohol withdrawal ([place]) 12/29/2013  . Dehydration with hyponatremia 12/29/2013  . Depression 12/29/2013  . Hyperglycemia 12/29/2013  . Melena 12/29/2013  . Elevated transaminase level 12/29/2013  . HYPERLIPIDEMIA-MIXED 04/21/2009  . HYPERTENSION, BENIGN 04/21/2009  . OTHER DISORDERS PAPILLARY MUSCLE 01/24/2009  . 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 FINGER ARTHROPLASTY Right    volar plate arthroplasty, right small finger proxima linterphalangeal joint [Other]  . FRACTURE SURGERY    . SEPTOSTOMY     Rashkind balloon atrial septostomy   . TRANSPOSITION OF GREAT VESSELS REPAIR  1980   "mustard procedure"       Home Medications    Prior to Admission medications   Medication Sig Start Date  End Date Taking? Authorizing Provider  escitalopram (LEXAPRO) 10 MG tablet Take 1 tablet (10 mg total) by mouth daily. 09/18/17  Yes Mikey Kirschner, MD  losartan (COZAAR) 50 MG tablet Take 1 tablet (50 mg total) by mouth daily. 12/31/16  Yes Bhagat, Bhavinkumar, PA  metoprolol tartrate (LOPRESSOR) 25 MG tablet Take 50 mg (2 tabs) by mouth in AM and 25 mg (1 tab) by mouth in PM. 12/31/16   Yes Bhagat, Bhavinkumar, PA  warfarin (COUMADIN) 5 MG tablet Take as directed by [facility] Patient not taking: Reported on 09/18/2017 10/14/16   Fay Records, MD    Family History Family History  Problem Relation Age of Onset  . Rheumatic fever Father   . Hypertension Father     Social History Social History   Tobacco Use  . Smoking status: Never Smoker  . Smokeless tobacco: Current User    Types: Snuff  . Tobacco comment: "dipped when I played softball; none since 2011"  Substance Use Topics  . Alcohol use: Yes    Alcohol/week: 36.0 oz    Types: 60 Shots of liquor per week    Comment: 09/24/2014 "1/5th vodka in 2 days; easy"  . Drug use: No     Allergies   Aspirin   Review of Systems Review of Systems  Constitutional: Positive for appetite change and diaphoresis. Negative for chills and fever.  HENT: Negative for congestion, sore throat and trouble swallowing.   Eyes: Negative for photophobia and visual disturbance.  Respiratory: Positive for shortness of breath. Negative for cough and wheezing.   Cardiovascular: Positive for chest pain and palpitations. Negative for leg swelling.  Gastrointestinal: Positive for abdominal pain, diarrhea, nausea and vomiting. Negative for blood in stool.  Genitourinary: Negative for dysuria, flank pain, frequency and hematuria.  Musculoskeletal: Negative for back pain, myalgias, neck pain and neck stiffness.  Skin: Negative for rash and wound.  Neurological: Negative for dizziness, syncope, weakness, light-headedness, numbness and headaches.  Psychiatric/Behavioral: The patient is nervous/anxious.   All other systems reviewed and are negative.    Physical Exam Updated Vital Signs BP 130/86   Pulse 90   Resp 20   Ht 6\' 2"  (1.88 m)   Wt 104.3 kg (230 lb)   SpO2 95%   BMI 29.53 kg/m   Physical Exam  Constitutional: He is oriented to person, place, and time. He appears well-developed and well-nourished.  HENT:  Head:  Normocephalic and atraumatic.  Mouth/Throat: Oropharynx is clear and moist. No oropharyngeal exudate.  Eyes: EOM are normal. Pupils are equal, round, and reactive to light.  Neck: Normal range of motion. Neck supple. No JVD present.  Cardiovascular: Normal rate and regular rhythm.  Pulmonary/Chest: Effort normal. No stridor. No respiratory distress. He has no wheezes. He has rales. He exhibits no tenderness.  Few crackles in bilateral bases.  Abdominal: Soft. Bowel sounds are normal. There is tenderness. There is no rebound and no guarding.  Mild left upper quadrant tenderness to palpation.  Musculoskeletal: Normal range of motion. He exhibits no edema or tenderness.  No lower extremity swelling, asymmetry or tenderness.  Distal pulses are 2+.  No CVA tenderness.  Lymphadenopathy:    He has no cervical adenopathy.  Neurological: He is alert and oriented to person, place, and time.  Moves all extremities without focal deficit.  Sensation intact.  Skin: Skin is warm. Capillary refill takes less than 2 seconds. No rash noted. He is diaphoretic. No erythema.  Psychiatric:  Anxious appearing.  Nursing note and vitals reviewed.    ED Treatments / Results  Labs (all labs ordered are listed, but only abnormal results are displayed) Labs Reviewed  COMPREHENSIVE METABOLIC PANEL - Abnormal; Notable for the following components:      Result Value   Sodium 125 (*)    Potassium 2.5 (*)    Chloride 71 (*)    Glucose, Bld 143 (*)    Creatinine, Ser 0.60 (*)    Calcium 7.5 (*)    Albumin 3.3 (*)    AST 115 (*)    ALT 81 (*)    Total Bilirubin 2.1 (*)    Anion gap <20 (*)    All other components within normal limits  ETHANOL - Abnormal; Notable for the following components:   Alcohol, Ethyl (B) 80 (*)    All other components within normal limits  CBC - Abnormal; Notable for the following components:   WBC 13.9 (*)    MCH 35.5 (*)    All other components within normal limits  TROPONIN I    LIPASE, BLOOD  RAPID URINE DRUG SCREEN, HOSP PERFORMED  BRAIN NATRIURETIC PEPTIDE  MAGNESIUM    EKG  EKG Interpretation  Date/Time:  Monday September 29 2017 10:38:25 EDT Ventricular Rate:  86 PR Interval:    QRS Duration: 110 QT Interval:  425 QTC Calculation: 509 R Axis:   54 Text Interpretation:  Sinus or ectopic atrial rhythm Borderline prolonged PR interval RVH with secondary repolarization abnrm Repol abnrm, severe global ischemia (LM/MVD) Prolonged QT interval Confirmed by Julianne Rice [phone]) on 09/29/2017 10:46:05 AM       Radiology Dg Chest Port 1 View  Result Date: 09/29/2017 CLINICAL DATA:  Chest pain and hypertension EXAM: PORTABLE CHEST 1 VIEW COMPARISON:  September 23, 2014 FINDINGS: There is no edema or consolidation. Heart is upper normal in size with pulmonary vascularity within normal limits. No adenopathy. Patient is status post median sternotomy. No bone lesions. IMPRESSION: No edema or consolidation.  Stable cardiac silhouette. Electronically Signed   By: Lowella Grip III M.D.   On: 09/29/2017 11:24    Procedures Procedures (including critical care time)  Medications Ordered in ED Medications  potassium chloride 10 mEq in 100 mL IVPB (10 mEq Intravenous New Bag/Given 09/29/17 1308)  LORazepam (ATIVAN) injection 0-4 mg (not administered)    Or  LORazepam (ATIVAN) tablet 0-4 mg (not administered)  LORazepam (ATIVAN) injection 0-4 mg (not administered)    Or  LORazepam (ATIVAN) tablet 0-4 mg (not administered)  thiamine (VITAMIN B-1) tablet 100 mg ( Oral See Alternative 09/29/17 1318)    Or  thiamine (B-1) injection 100 mg (100 mg Intravenous Given 09/29/17 1318)  LORazepam (ATIVAN) injection 1 mg (1 mg Intravenous Given 09/29/17 1136)  ondansetron (ZOFRAN) injection 4 mg (4 mg Intravenous Given 09/29/17 1134)  sodium chloride 0.9 % bolus 500 mL (0 mLs Intravenous Stopped 09/29/17 1322)  sodium chloride 0.9 % bolus 1,000 mL (1,000 mLs Intravenous New  Bag/Given 09/29/17 1308)  gi cocktail (Maalox,Lidocaine,Donnatal) (30 mLs Oral Given 09/29/17 1318)    CRITICAL CARE Performed by: Julianne Rice Total critical care time: 35 minutes Critical care time was exclusive of separately billable procedures and treating other patients. Critical care was necessary to treat or prevent imminent or life-threatening deterioration. Critical care was time spent personally by me on the following activities: development of treatment plan with patient and/or surrogate as well as nursing, discussions with consultants, evaluation of patient's response to treatment, examination of patient,  obtaining history from patient or surrogate, ordering and performing treatments and interventions, ordering and review of laboratory studies, ordering and review of radiographic studies, pulse oximetry and re-evaluation of patient's condition. Initial Impression / Assessment and Plan / ED Course  I have reviewed the triage vital signs and the nursing notes.  Pertinent labs & imaging results that were available during my care of the patient were reviewed by me and considered in my medical decision making (see chart for details).    Given Ativan with improvement in tachycardia and agitation.  Suspect patient may have some degree of alcohol withdrawal contributing to his symptoms.  Start IV fluids for likely dehydration.  Laboratory workup significant for hyponatremia, hypokalemia, leukocytosis, elevated LFT's.  Normal troponin.  Patient has ST segment depression and diffuse T wave inversions.  This appears somewhat similar to his previous EKG's.  He currently is denying any chest pain.  Initiated potassium replacement in the emergency department.  Discussed with hospitalist will admit patient to stepdown bed given concern for alcohol withdrawal.   Final Clinical Impressions(s) / ED Diagnoses   Final diagnoses:  Nonspecific chest pain  Hyponatremia  Hypokalemia  Abnormal EKG     ED Discharge Orders    None       Julianne Rice, MD 09/29/17 1339

## 2017-09-29 NOTE — ED Notes (Signed)
Lab called needed redraw in lavender tube due to clots in sample.

## 2017-09-29 NOTE — ED Triage Notes (Addendum)
Pt reports chest pain,nausea,emesis since last night. Pt denies any change in pain with movement or deep breath. nad noted. Per EMS pt has been drinking ETOH for last several days. Last drink approximately 2 hours. Pt recently started on depression medication. Pt reports stopped taking coumadin x1 month ago.  Pt will not make eye contact and is very short with responses during triage. Moderate anxiety noted.

## 2017-09-29 NOTE — H&P (Signed)
History and Physical  DEVIN GANAWAY AOZ:[id number] DOB: Jul 25, 1975 DOA: 09/29/2017  Referring physician: Lita Mains, MD  PCP: Mikey Kirschner, MD   Chief Complaint: Chest Pain  HPI: Matthew [name] is a 42 y.o. male status post mustard repair for transposition of the great vessels, history of atrial flutter, alcoholism, depression, systolic CHF and [facility] spot fever.  He presented to ED with complaints of left-sided chest pain that started at 3 AM this morning.  Pain did not radiate.  He reports that his pain was associated with mild shortness of breath.  He has been bing drinking for the past several days due to depression.  Patient states his chest pain is now resolved.  States that over the last month he has had relapse of his alcoholism.  He has been drinking daily.  He has a history of severe alcohol withdrawal.  He stated that he drank 1/5 of liquor on Saturday and has had vomiting and diarrhea since.  He reports having multiple episodes of vomiting overnight.  He also complains of intermittent left upper abdominal pain.  He denies melanotic or grossly bloody stools.  The patient reports diaphoresis but no known fever or chills.  No new lower extremity swelling or pain.  Patient states he is not been taking his Coumadin for the past month. Last drank a beer 2 hours before arrival.  ED Course:  Pt was noted to be clinically dehydrated and to have multiple electrolyte abnormalities.  His EKG was abnormal.  He was in acute alcohol withdrawal.  He is being admitted to SDU for further managmement.    Severity of Illness: The appropriate patient status for this patient is INPATIENT. Inpatient status is judged to be reasonable and necessary in order to provide the required intensity of service to ensure the patient's safety. The patient's presenting symptoms, physical exam findings, and initial radiographic and laboratory data in the context of their chronic comorbidities is felt  to place them at high risk for further clinical deterioration. Furthermore, it is not anticipated that the patient will be medically stable for discharge from the hospital within 2 midnights of admission. The following factors support the patient status of inpatient.   " The patient's presenting symptoms include acute alcohol withdrawal, chest pain. " The worrisome physical exam findings include tachycardia. " The initial radiographic and laboratory data are worrisome because of hyponatremia, hypokalemia. " The chronic co-morbidities include heart disease.  * I certify that at the point of admission it is my clinical judgment that the patient will require inpatient hospital care spanning beyond 2 midnights from the point of admission due to high intensity of service, high risk for further deterioration and high frequency of surveillance required.*  Review of Systems: All systems reviewed and apart from history of presenting illness, are negative.  Past Medical History:  Diagnosis Date  . Alcohol abuse   . Anxiety   . Atypical atrial flutter ([place])   . Complete transposition of great vessels   . Depression   . Dyslipidemia   . GERD (gastroesophageal reflux disease)   . Headache    "weekly" (09/24/2014)  . Heart murmur   . Hypertension   . 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 SVT (supraventricular tachycardia) ([place]) "several times in the last year" (09/24/2014)  . Transposition of great vessel    s/p Mustard procedure in 1980   Past Surgical History:  Procedure Laterality Date  . FINGER ARTHROPLASTY Right    volar  plate arthroplasty, right small finger proxima linterphalangeal joint [Other]  . FRACTURE SURGERY    . SEPTOSTOMY     Rashkind balloon atrial septostomy   . TRANSPOSITION OF GREAT VESSELS REPAIR  1980   "mustard procedure"   Social History:  reports that  has never smoked. His smokeless tobacco use includes snuff. He reports that he drinks about 36.0 oz of alcohol per  week. He reports that he does not use drugs.  Allergies  Allergen Reactions  . Aspirin Other (See Comments)    Unknown    Family History  Problem Relation Age of Onset  . Rheumatic fever Father   . Hypertension Father     Prior to Admission medications   Medication Sig Start Date End Date Taking? Authorizing Provider  escitalopram (LEXAPRO) 10 MG tablet Take 1 tablet (10 mg total) by mouth daily. 09/18/17  Yes Mikey Kirschner, MD  losartan (COZAAR) 50 MG tablet Take 1 tablet (50 mg total) by mouth daily. 12/31/16  Yes Bhagat, Bhavinkumar, PA  metoprolol tartrate (LOPRESSOR) 25 MG tablet Take 50 mg (2 tabs) by mouth in AM and 25 mg (1 tab) by mouth in PM. 12/31/16  Yes Bhagat, [place], PA   Physical Exam: Vitals:   09/29/17 1338 09/29/17 1400 09/29/17 1430 09/29/17 1500  BP:  124/88 113/81 109/82  Pulse: 87 83 80 82  Resp: (!) 23 18 20  (!) 22  SpO2: 94% 95% 94% 93%  Weight:      Height:        General exam: Moderately built and nourished patient, lying comfortably supine on the gurney in no obvious distress.  Head, eyes and ENT: Nontraumatic and normocephalic. Pupils equally reacting to light and accommodation. Oral mucosa dry.  Neck: Supple. No JVD, carotid bruit or thyromegaly.  Lymphatics: No lymphadenopathy.  Respiratory system: Clear to auscultation. No increased work of breathing.  Cardiovascular system: S1 and S2 heard, irregular rhythm. No JVD.   Gastrointestinal system: Abdomen is nondistended, soft and nontender. Normal bowel sounds heard. No organomegaly or masses appreciated.  Central nervous system: Alert and oriented. No focal neurological deficits.  Extremities: Symmetric 5 x 5 power. Peripheral pulses symmetrically felt.   Skin: No rashes or acute findings.  Musculoskeletal system: Negative exam.  Psychiatry: Pleasant and cooperative.  Labs on Admission:  Basic Metabolic Panel: Recent Labs  Lab 09/29/17 1124 09/29/17 1242  NA 125*  --   K  2.5*  --   CL 71*  --   CO2 30  --   GLUCOSE 143*  --   BUN 13  --   CREATININE 0.60*  --   CALCIUM 7.5*  --   MG  --  0.8*   Liver Function Tests: Recent Labs  Lab 09/29/17 1124  AST 115*  ALT 81*  ALKPHOS 89  BILITOT 2.1*  PROT 6.7  ALBUMIN 3.3*   Recent Labs  Lab 09/29/17 1124  LIPASE 37   No results for input(s): AMMONIA in the last 168 hours. CBC: Recent Labs  Lab 09/29/17 1226  WBC 13.9*  HGB 16.3  HCT 45.4  MCV 98.9  PLT 385   Cardiac Enzymes: Recent Labs  Lab 09/29/17 1124  TROPONINI <0.03    BNP (last 3 results) No results for input(s): PROBNP in the last 8760 hours. CBG: No results for input(s): GLUCAP in the last 168 hours.  Radiological Exams on Admission: Dg Chest Port 1 View  Result Date: 09/29/2017 CLINICAL DATA:  Chest pain and hypertension EXAM:  PORTABLE CHEST 1 VIEW COMPARISON:  September 23, 2014 FINDINGS: There is no edema or consolidation. Heart is upper normal in size with pulmonary vascularity within normal limits. No adenopathy. Patient is status post median sternotomy. No bone lesions. IMPRESSION: No edema or consolidation.  Stable cardiac silhouette. Electronically Signed   By: Lowella Grip III M.D.   On: 09/29/2017 11:24    EKG: Independently reviewed. Patient has ST segment depression and diffuse T wave inversions.   Assessment/Plan Principal Problem:   Hyponatremia Active Problems:   HYPERTENSION, BENIGN   Complete transposition of great vessels   Nausea vomiting and diarrhea   Atrial flutter (HCC)   Hypokalemia   Hypomagnesemia   Alcohol abuse   Alcohol withdrawal (HCC)   Dehydration with hyponatremia   Depression   Elevated transaminase level   Protein-calorie malnutrition, severe (HCC)   Pulmonary hypertension (HCC)   Chronic systolic CHF (congestive heart failure) (HCC)   Tachycardia   SVT (supraventricular tachycardia) (HCC)   Hypocalcemia   Transaminitis   Chronic systolic congestive heart failure  (HCC)   Atrial flutter with rapid ventricular response (HCC)   Generalized anxiety disorder   Transposition of great vessel   Dyslipidemia   GERD (gastroesophageal reflux disease)   Leukocytosis   Hyperbilirubinemia   1. Acute alcohol withdrawal - Admit for supportive care and CIWA protocol with lorazepam ordered scheduled and as needed.   2. Hypokalemia - added potassium to IVFs.  3. Hypomagnesemia - IV magnesium ordered and recheck in AM.  4. Alcohol abuse - Pt intends to re-enroll in AA.  Ordered thiamine, MVI and folic acid. 5. Tachycardia - likely exacerbated by dehydration, reordered home beta blockers and treating supportively.  Monitor telemetry.   6. Chronic systolic CHF - appears not to be in acute exacerbation, He is clinically dehydrated.  Follow clinically.  7. Chronic Atrial Flutter - resume home medications and follow.  8. Chest pain - He does have some EKG changes. Will check troponins serially and cardiology has been consulted.   9. GERD - protonix ordered for GI protection.  10. Transaminitis - likely secondary to recent binge alcohol drinking, will follow.  11. Depression / GAD - continue home lexapro.  Consider TTS eval when medically stabilized.  12. Leukocytosis - likely reactive from recent vomiting, will follow.  No signs / symptoms of infection found.  13. N/V/D - likely secondary to acute alcohol withdrawal, treating as noted above.  14. Hyponatremia - hypovolemic, treating with IV normal saline infusion.    DVT Prophylaxis: lovenox Code Status: Full   Family Communication: bedside  Disposition Plan: TBD   Time spent: 15 mins  Irwin Brakeman, MD [facility] Pager [phone]  If 7PM-7AM, please contact night-coverage [email or web address] Password [facility] 09/29/2017, 3:36 PM

## 2017-09-29 NOTE — ED Notes (Signed)
Date and time results received: 09/29/17 1237   Test: Potassium Critical Value: 2.5  Name of Provider Notified: Dr. Lita Mains  Orders Received? Or Actions Taken?: None given.

## 2017-09-29 NOTE — ED Notes (Signed)
Date and time results received: 09/29/17 2:05 PM   Test: .Magnesium Critical Value: 0.8  Name of Provider Notified: Wynetta Emery  Orders Received? Or Actions Taken?: no new orders at this time

## 2017-09-29 NOTE — ED Notes (Signed)
Stopped pt.'s run of Potassium

## 2017-09-30 ENCOUNTER — Inpatient Hospital Stay (HOSPITAL_COMMUNITY): Payer: BLUE CROSS/BLUE SHIELD

## 2017-09-30 DIAGNOSIS — R9431 Abnormal electrocardiogram [ECG] [EKG]: Secondary | ICD-10-CM

## 2017-09-30 DIAGNOSIS — Q203 Discordant ventriculoarterial connection: Secondary | ICD-10-CM

## 2017-09-30 DIAGNOSIS — F1010 Alcohol abuse, uncomplicated: Secondary | ICD-10-CM

## 2017-09-30 DIAGNOSIS — R079 Chest pain, unspecified: Secondary | ICD-10-CM

## 2017-09-30 DIAGNOSIS — E43 Unspecified severe protein-calorie malnutrition: Secondary | ICD-10-CM

## 2017-09-30 LAB — CBC WITH DIFFERENTIAL/PLATELET
Basophils Absolute: 0 10*3/uL (ref 0.0–0.1)
Basophils Relative: 0 %
Eosinophils Absolute: 0 10*3/uL (ref 0.0–0.7)
Eosinophils Relative: 0 %
HCT: 40.8 % (ref 39.0–52.0)
Hemoglobin: 14.4 g/dL (ref 13.0–17.0)
Lymphocytes Relative: 36 %
Lymphs Abs: 2.6 10*3/uL (ref 0.7–4.0)
MCH: 35.2 pg — ABNORMAL HIGH (ref 26.0–34.0)
MCHC: 35.3 g/dL (ref 30.0–36.0)
MCV: 99.8 fL (ref 78.0–100.0)
Monocytes Absolute: 0.5 10*3/uL (ref 0.1–1.0)
Monocytes Relative: 7 %
Neutro Abs: 4.2 10*3/uL (ref 1.7–7.7)
Neutrophils Relative %: 57 %
Platelets: 266 10*3/uL (ref 150–400)
RBC: 4.09 MIL/uL — ABNORMAL LOW (ref 4.22–5.81)
RDW: 12.5 % (ref 11.5–15.5)
WBC: 7.3 10*3/uL (ref 4.0–10.5)

## 2017-09-30 LAB — COMPREHENSIVE METABOLIC PANEL
ALT: 58 U/L (ref 17–63)
AST: 90 U/L — ABNORMAL HIGH (ref 15–41)
Albumin: 2.8 g/dL — ABNORMAL LOW (ref 3.5–5.0)
Alkaline Phosphatase: 71 U/L (ref 38–126)
Anion gap: 13 (ref 5–15)
BUN: 16 mg/dL (ref 6–20)
CO2: 32 mmol/L (ref 22–32)
Calcium: 7.7 mg/dL — ABNORMAL LOW (ref 8.9–10.3)
Chloride: 89 mmol/L — ABNORMAL LOW (ref 101–111)
Creatinine, Ser: 0.94 mg/dL (ref 0.61–1.24)
GFR calc Af Amer: >60 mL/min (ref >60)
GFR calc non Af Amer: >60 mL/min (ref >60)
Glucose, Bld: 125 mg/dL — ABNORMAL HIGH (ref 65–99)
Potassium: 3 mmol/L — ABNORMAL LOW (ref 3.5–5.1)
Sodium: 134 mmol/L — ABNORMAL LOW (ref 135–145)
Total Bilirubin: 2.1 mg/dL — ABNORMAL HIGH (ref 0.3–1.2)
Total Protein: 5.5 g/dL — ABNORMAL LOW (ref 6.5–8.1)

## 2017-09-30 LAB — PROTIME-INR
INR: 0.99
Prothrombin Time: 13 seconds (ref 11.4–15.2)

## 2017-09-30 LAB — MAGNESIUM: Magnesium: 1.8 mg/dL (ref 1.7–2.4)

## 2017-09-30 LAB — TROPONIN I: Troponin I: <0.03 ng/mL (ref <0.03)

## 2017-09-30 LAB — HIV ANTIBODY (ROUTINE TESTING W REFLEX): HIV Screen 4th Generation wRfx: NONREACTIVE

## 2017-09-30 MED ORDER — WARFARIN SODIUM 7.5 MG PO TABS
7.5000 mg | ORAL_TABLET | Freq: Once | ORAL | Status: AC
  Administered 2017-09-30: 7.5 mg via ORAL
  Filled 2017-09-30: qty 1

## 2017-09-30 MED ORDER — MAGNESIUM SULFATE 2 GM/50ML IV SOLN
2.0000 g | Freq: Once | INTRAVENOUS | Status: AC
  Administered 2017-09-30: 2 g via INTRAVENOUS
  Filled 2017-09-30: qty 50

## 2017-09-30 MED ORDER — POTASSIUM CHLORIDE CRYS ER 20 MEQ PO TBCR
60.0000 meq | EXTENDED_RELEASE_TABLET | Freq: Once | ORAL | Status: AC
  Administered 2017-09-30: 60 meq via ORAL
  Filled 2017-09-30: qty 3

## 2017-09-30 MED ORDER — SODIUM CHLORIDE 0.9 % IV SOLN
1.0000 g | Freq: Once | INTRAVENOUS | Status: AC
  Administered 2017-09-30: 1 g via INTRAVENOUS
  Filled 2017-09-30: qty 10

## 2017-09-30 MED ORDER — PHENOL 1.4 % MT LIQD
1.0000 | OROMUCOSAL | Status: DC | PRN
  Administered 2017-09-30: 1 via OROMUCOSAL
  Filled 2017-09-30 (×2): qty 177

## 2017-09-30 MED ORDER — TRAMADOL HCL 50 MG PO TABS
50.0000 mg | ORAL_TABLET | Freq: Once | ORAL | Status: AC | PRN
  Administered 2017-10-01: 50 mg via ORAL
  Filled 2017-09-30: qty 1

## 2017-09-30 MED ORDER — WARFARIN - PHARMACIST DOSING INPATIENT
Freq: Every day | Status: DC
  Administered 2017-09-30 – 2017-10-01 (×2)

## 2017-09-30 MED ORDER — ACETAMINOPHEN 500 MG PO TABS
1000.0000 mg | ORAL_TABLET | Freq: Once | ORAL | Status: AC
  Administered 2017-09-30: 1000 mg via ORAL
  Filled 2017-09-30: qty 2

## 2017-09-30 MED ORDER — BUTALBITAL-APAP-CAFFEINE 50-325-40 MG PO TABS: 2.0000 | ORAL_TABLET | Freq: Once | ORAL | Status: DC

## 2017-09-30 MED ORDER — ENOXAPARIN SODIUM 150 MG/ML ~~LOC~~ SOLN
150.0000 mg | SUBCUTANEOUS | Status: DC
  Administered 2017-09-30 – 2017-10-02 (×3): 150 mg via SUBCUTANEOUS
  Filled 2017-09-30 (×4): qty 1

## 2017-09-30 NOTE — Progress Notes (Signed)
Progress Note  Matthew [name] Name: Matthew [name] Date of Encounter: 09/30/2017  Primary Cardiologist: Dr Harrington Challenger  Subjective   No chest pain or dyspnea   Inpatient Medications    Scheduled Meds: . Chlorhexidine Gluconate Cloth  6 each Topical Q0600  . enoxaparin (LOVENOX) injection  40 mg Subcutaneous Q24H  . escitalopram  10 mg Oral Daily  . folic acid  1 mg Oral Daily  . LORazepam  0-4 mg Intravenous Q4H   Followed by  . [START ON 10/01/2017] LORazepam  0-4 mg Intravenous Q12H  . losartan  50 mg Oral Daily  . metoprolol tartrate  25 mg Oral QHS  . metoprolol tartrate  50 mg Oral Daily  . multivitamin with minerals  1 tablet Oral Daily  . mupirocin ointment  1 application Nasal BID  . pantoprazole  40 mg Oral BID  . thiamine  100 mg Oral Daily   Or  . thiamine  100 mg Intravenous Daily   Continuous Infusions: . 0.9 % NaCl with KCl 40 mEq / L 75 mL/hr (09/30/17 0733)  . calcium gluconate 1 g (09/30/17 0737)  . magnesium sulfate 1 - 4 g bolus IVPB 2 g (09/30/17 0733)   PRN Meds: ibuprofen, LORazepam **OR** LORazepam, ondansetron **OR** ondansetron (ZOFRAN) IV, traZODone   Vital Signs    Vitals:   09/30/17 0400 09/30/17 0500 09/30/17 0600 09/30/17 0700  BP: 102/70 107/73 103/70 115/76  Pulse: 81 73 78 93  Resp: (!) 21 (!) 22 19 18   Temp: (!) 97.4 F (36.3 C)     TempSrc: Oral     SpO2: 92% 92% 92% 96%  Weight:  222 lb 14.2 oz (101.1 kg)    Height:        Intake/Output Summary (Last 24 hours) at 09/30/2017 0806 Last data filed at 09/30/2017 0600 Gross per 24 hour  Intake 2072.5 ml  Output -  Net 2072.5 ml   Filed Weights   09/29/17 1037 09/30/17 0500  Weight: 230 lb (104.3 kg) 222 lb 14.2 oz (101.1 kg)    Telemetry    NSR has converted from flutter - Personally Reviewed  ECG    Flutter with RVH - Personally Reviewed  Physical Exam   GEN: No acute distress.   Neck: No JVD Cardiac: RRR, systemic AV valve regurgitant murmurs, rubs, or gallops.    Respiratory: Clear to auscultation bilaterally. GI: Soft, nontender, non-distended  MS: No edema; No deformity. Neuro:  Nonfocal  Psych: Normal affect   Labs    Chemistry Recent Labs  Lab 09/29/17 1124 09/30/17 0355  NA 125* 134*  K 2.5* 3.0*  CL 71* 89*  CO2 30 32  GLUCOSE 143* 125*  BUN 13 16  CREATININE 0.60* 0.94  CALCIUM 7.5* 7.7*  PROT 6.7 5.5*  ALBUMIN 3.3* 2.8*  AST 115* 90*  ALT 81* 58  ALKPHOS 89 71  BILITOT 2.1* 2.1*  GFRNONAA >60 >60  GFRAA >60 >60  ANIONGAP <20* 13     Hematology Recent Labs  Lab 09/29/17 1226 09/30/17 0355  WBC 13.9* 7.3  RBC 4.59 4.09*  HGB 16.3 14.4  HCT 45.4 40.8  MCV 98.9 99.8  MCH 35.5* 35.2*  MCHC 35.9 35.3  RDW 13.3 12.5  PLT 385 266    Cardiac Enzymes Recent Labs  Lab 09/29/17 1124 09/29/17 1622 09/29/17 2157 09/30/17 0355  TROPONINI <0.03 <0.03 <0.03 <0.03   No results for input(s): TROPIPOC in the last 168 hours.   BNP Recent Labs  Lab 09/29/17 1124  BNP 50.0     DDimer No results for input(s): DDIMER in the last 168 hours.   Radiology    Dg Chest Port 1 View  Result Date: 09/30/2017 CLINICAL DATA:  Chest pain. Nausea. Emesis. Leukocytosis. Hypertension. EXAM: PORTABLE CHEST 1 VIEW COMPARISON:  09/29/2017. FINDINGS: Prior median sternotomy. Cardiomegaly. Low lung volumes with mild bibasilar atelectasis. Stable left base pleuroparenchymal thickening consistent scarring. No pneumothorax. IMPRESSION: Prior median sternotomy.  Stable cardiomegaly. 2. Low lung volumes with mild bibasilar atelectasis. Stable left base pleuroparenchymal thickening consistent scarring. Electronically Signed   By: Matthew [name]  Register   On: 09/30/2017 07:25   Dg Chest Port 1 View  Result Date: 09/29/2017 CLINICAL DATA:  Chest pain and hypertension EXAM: PORTABLE CHEST 1 VIEW COMPARISON:  September 23, 2014 FINDINGS: There is no edema or consolidation. Heart is upper normal in size with pulmonary vascularity within normal limits. No  adenopathy. Matthew [name] is status post median sternotomy. No bone lesions. IMPRESSION: No edema or consolidation.  Stable cardiac silhouette. Electronically Signed   By: Matthew [name] M.D.   On: 09/29/2017 11:24    Cardiac Studies   Echo 08/2015 systemic ventricle EF 35%  Matthew [name] Profile     42 y.o. male with transposition and mustard procedure in first year of life. Admitted with ETOH intoxication Alcoholic with relapse. Also atrial flutter has not taken his meds or coumadin for a month   Assessment & Plan    1. Flutter NSR this am continue beta blocker get back on coumadin 2. Congenital heart disease. Needs referral to [facility] as outpatient for cardiac MRI and pediatric echo . Known systemic ventricular dysfunction. Continue Beta blocker and ARB.  3. ETOH:  Had 8 month rehab in [place] in past. Needs another stint plan per Primary service   For questions or updates, please contact [name] Please consult [email or web address] for contact info under Cardiology/STEMI.      Signed, [name], MD  09/30/2017, 8:06 AM

## 2017-09-30 NOTE — BHH Counselor (Signed)
Disposition:   Per Otila Kluver, NP, observe patient overnight for stability and re-assessment in the morning.

## 2017-09-30 NOTE — BH Assessment (Signed)
Tele Assessment Note   Patient Name: Matthew [name] MRN: [id number] Referring Physician: Murlean Iba, MD Location of Patient: [facility] Location of Provider: [place] Department  MICKEY ESGUERRA is an 42 y.o. male present to [facility] with acute alcohol withdrawal, worsen depression and medical complaints of chest pain. Patient report he has been sober for 10-years and relapsed on alcohol triggered by worsening depression. Patient has been binging drinking and admitted to the hospital for alcohol detox management.   EDP note contradicts patient report of being sober for 10 years as it reports patient had an 8 month rehab stint in [place], however, it does provide a date for the inpatient treatment.   Patient report he has been battling depression for years. Report his father passed away 10/15/13 due to medical complications from a stroke. Four months later his wife. Patient prescribed Lexapro. Patient denies SI, HI, and AVH.   Diagnosis: F33.2  Major depressive disorder, Recurrent episode, Severe  Past Medical History:  Past Medical History:  Diagnosis Date  . Alcohol abuse   . Anxiety   . Atypical atrial flutter ([place])   . Complete transposition of great vessels   . Depression   . Dyslipidemia   . GERD (gastroesophageal reflux disease)   . Headache    "weekly" (09/24/2014)  . Heart murmur   . Hypertension   . 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 SVT (supraventricular tachycardia) ([place]) "several times in the last year" (09/24/2014)  . 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 FINGER ARTHROPLASTY Right    volar plate arthroplasty, right small finger proxima linterphalangeal joint [Other]  . FRACTURE SURGERY    . SEPTOSTOMY     Rashkind balloon atrial septostomy   . TRANSPOSITION OF GREAT VESSELS REPAIR  1980   "mustard procedure"    Family History:  Family History   Problem Relation Age of Onset  . Rheumatic fever Father   . Hypertension Father     Social History:  reports that  has never smoked. His smokeless tobacco use includes snuff. He reports that he drinks about 36.0 oz of alcohol per week. He reports that he does not use drugs.  Additional Social History:  Alcohol / Drug Use Pain Medications: see MAR Prescriptions: see MAR Over the Counter: see MAR History of alcohol / drug use?: Yes Substance #1 Name of Substance 1: Alcohol  1 - Age of First Use: 22 1 - Amount (size/oz): varies amounts  1 - Frequency: daily  1 - Duration: ongoing  1 - Last Use / Amount: 09/30/2017  CIWA: CIWA-Ar BP: 123/86 Pulse Rate: 90 Nausea and Vomiting: mild nausea with no vomiting Tactile Disturbances: very mild itching, pins and needles, burning or numbness Tremor: two Auditory Disturbances: not present Paroxysmal Sweats: two Visual Disturbances: not present Anxiety: two Headache, Fullness in Head: none present Agitation: two Orientation and Clouding of Sensorium: oriented and can do serial additions CIWA-Ar Total: 10 COWS:    Allergies:  Allergies  Allergen Reactions  . Aspirin Other (See Comments)    Unknown    Home Medications:  Medications Prior to Admission  Medication Sig Dispense Refill  . escitalopram (LEXAPRO) 10 MG tablet Take 1 tablet (10 mg total) by mouth daily. 30 tablet 5  . losartan (COZAAR) 50 MG tablet Take 1 tablet (50 mg total) by mouth daily. 90 tablet 3  . metoprolol tartrate (LOPRESSOR)  25 MG tablet Take 50 mg (2 tabs) by mouth in AM and 25 mg (1 tab) by mouth in PM. 270 tablet 3    OB/GYN Status:  No LMP for male patient.  General Assessment Data Location of Assessment: [facility] TTS Assessment: In system Is this a Tele or Face-to-Face Assessment?: Tele Assessment Is this an Initial Assessment or a Re-assessment for this encounter?: Initial Assessment Marital status: Separated Living Arrangements: Alone Can pt  return to current living arrangement?: Yes Admission Status: Voluntary Is patient capable of signing voluntary admission?: Yes Referral Source: Self/Family/Friend Insurance type: [employer]     Crisis Care Plan Living Arrangements: Alone Name of Psychiatrist: yes, unable to recall name Name of Therapist: denies   Education Status Is patient currently in school?: No Is the patient employed, unemployed or receiving disability?: Employed  Risk to self with the past 6 months Suicidal Ideation: No Has patient been a risk to self within the past 6 months prior to admission? : No Suicidal Intent: No Has patient had any suicidal intent within the past 6 months prior to admission? : No Is patient at risk for suicide?: No Suicidal Plan?: No Has patient had any suicidal plan within the past 6 months prior to admission? : No Access to Means: No What has been your use of drugs/alcohol within the last 12 months?: alcohol Previous Attempts/Gestures: No How many times?: 0 Other Self Harm Risks: none known Triggers for Past Attempts: None known Intentional Self Injurious Behavior: None Family Suicide History: No Recent stressful life event(s): (dealing with worsening depression) Persecutory voices/beliefs?: No Depression: Yes Depression Symptoms: Despondent, Insomnia, Isolating, Guilt, Loss of interest in usual pleasures, Feeling worthless/self pity Substance abuse history and/or treatment for substance abuse?: Yes Suicide prevention information given to non-admitted patients: Not applicable  Risk to Others within the past 6 months Homicidal Ideation: No Does patient have any lifetime risk of violence toward others beyond the six months prior to admission? : No Thoughts of Harm to Others: No Current Homicidal Intent: No Current Homicidal Plan: No Access to Homicidal Means: No Identified Victim: none known History of harm to others?: No Assessment of Violence: None  Noted Violent Behavior Description: none noted Does patient have access to weapons?: No Criminal Charges Pending?: No Does patient have a court date: No Is patient on probation?: No  Psychosis Hallucinations: None noted Delusions: None noted  Mental Status Report Appearance/Hygiene: In scrubs Eye Contact: Good Motor Activity: Freedom of movement Speech: Logical/coherent Level of Consciousness: Alert Mood: Depressed, Empty, Guilty, Sad Affect: Depressed, Sad, Flat Anxiety Level: None Thought Processes: Coherent Judgement: Partial Orientation: Person, Time, Place, Situation Obsessive Compulsive Thoughts/Behaviors: None  Cognitive Functioning Concentration: Good Memory: Recent Intact, Remote Intact Is patient IDD: No Is patient DD?: No Insight: see judgement above Impulse Control: Poor Appetite: Fair Have you had any weight changes? : No Change Sleep: Decreased Total Hours of Sleep: 6 Vegetative Symptoms: None  ADLScreening [facility] Assessment Services) Patient's cognitive ability adequate to safely complete daily activities?: Yes Patient able to express need for assistance with ADLs?: Yes Independently performs ADLs?: Yes (appropriate for developmental age)  Prior Inpatient Therapy Prior Inpatient Therapy: Yes Prior Therapy Dates: unable to recall Prior Therapy Facilty/Provider(s): unable to recall Reason for Treatment: alsonol menta   Prior Outpatient Therapy Prior Outpatient Therapy: No Does patient have an ACCT team?: No Does patient have Intensive In-House Services?  : No Does patient have [facility] services? : No Does patient have P4CC services?: No  ADL Screening (condition at time of admission) Patient's cognitive ability adequate to safely complete daily activities?: Yes Is the patient deaf or have difficulty hearing?: No Does the patient have difficulty seeing, even when wearing glasses/contacts?: No Does the patient have difficulty concentrating,  remembering, or making decisions?: No Patient able to express need for assistance with ADLs?: Yes Does the patient have difficulty dressing or bathing?: No Independently performs ADLs?: Yes (appropriate for developmental age) Does the patient have difficulty walking or climbing stairs?: No Weakness of Legs: None Weakness of Arms/Hands: None  Home Assistive Devices/Equipment Home Assistive Devices/Equipment: None  Therapy Consults (therapy consults require a physician order) PT Evaluation Needed: No OT Evalulation Needed: No SLP Evaluation Needed: No Abuse/Neglect Assessment (Assessment to be complete while patient is alone) Abuse/Neglect Assessment Can Be Completed: Yes Physical Abuse: Denies Verbal Abuse: Denies Sexual Abuse: Denies Exploitation of patient/patient's resources: Denies Self-Neglect: Denies Values / Beliefs Cultural Requests During Hospitalization: None Spiritual Requests During Hospitalization: None Consults Spiritual Care Consult Needed: No Social Work Consult Needed: No Regulatory affairs officer (For Healthcare) Does Patient Have a Medical Advance Directive?: No Would patient like information on creating a medical advance directive?: No - Patient declined Nutrition Screen- [facility] Adult/[facility] Patient's home diet: Cardiac Has the patient recently lost weight without trying?: No Has the patient been eating poorly because of a decreased appetite?: No Malnutrition Screening Tool Score: 0  Additional Information 1:1 In Past 12 Months?: No CIRT Risk: No Elopement Risk: No Does patient have medical clearance?: No     Disposition:  Disposition Initial Assessment Completed for this Encounter: Yes     Mayra Jolliffe [facility] 09/30/2017 3:44 PM

## 2017-09-30 NOTE — Progress Notes (Signed)
PROGRESS NOTE    Matthew [name]  LKG:[id number]  DOB: 1975-10-21  DOA: 09/29/2017 PCP: Mikey Kirschner, MD   Brief Admission Hx: Matthew [name] is a 42 y.o. male status post mustard repair for transposition of the great vessels,history of atrial flutter, alcoholism, depression, systolic CHF and [facility] spot fever. He presented to ED with complaints of left-sided chest pain that started at 3 AM this morning.  He has also been binge drinking and is being admitted for alcohol detox management.    MDM/Assessment & Plan:   1. Acute alcohol withdrawal - Continue supportive care and CIWA protocol with lorazepam ordered scheduled and as needed.   Social worker consult for rehab treatment options.  He had an 8 month rehab stint in [place] and may benefit from more alcohol rehab.  2. Hypokalemia - added potassium to IVFs.  3. Hypomagnesemia - magnesium being repleted.  IV magnesium ordered and recheck in AM.  4. Alcohol abuse - Pt intends to re-enroll in AA and would like rehab consideration, Social worker consulted.  Ordered thiamine, MVI and folic acid. 5. Tachycardia - likely exacerbated by dehydration, reordered home beta blockers and treating supportively.  Monitor telemetry.   6. Chronic systolic CHF - appears not to be in acute exacerbation, He is clinically dehydrated.  Follow clinically.  7. Chronic Atrial Flutter - resume home medications and follow.  Cardiology restarted on lovenox / warfarin.  8. Chest pain - He does have some EKG changes. Will check troponins serially and cardiology has been consulted.   9. GERD - protonix ordered for GI protection.  10. Transaminitis - likely secondary to recent binge alcohol drinking, will follow.  11. Depression / GAD - continue home lexapro.  Consult TTS.  12. Leukocytosis - Resolved.  Likely reactive from recent vomiting, has returned to normal now.  No signs / symptoms of infection found.  13. N/V/D - likely secondary to  acute alcohol withdrawal, treating as noted above.  14. Hyponatremia - hypovolemic, treating with IV normal saline infusion.    DVT Prophylaxis: lovenox Code Status: Full   Family Communication: bedside  Disposition Plan: pending correction of electrolytes   Subjective: Pt says that he has had some shakes and agitation but overall is feeling better today.  His family says he had some hallucinations overnight.    Objective: Vitals:   09/30/17 0900 09/30/17 1000 09/30/17 1100 09/30/17 1200  BP: 109/84 97/69 110/79 123/86  Pulse: 86 87 85 90  Resp: (!) 23 (!) 31 17 (!) 28  Temp:      TempSrc:      SpO2: 93% 93% 94% 94%  Weight:      Height:        Intake/Output Summary (Last 24 hours) at 09/30/2017 1441 Last data filed at 09/30/2017 0600 Gross per 24 hour  Intake 1572.5 ml  Output -  Net 1572.5 ml   Filed Weights   09/29/17 1037 09/30/17 0500  Weight: 104.3 kg (230 lb) 101.1 kg (222 lb 14.2 oz)     REVIEW OF SYSTEMS  As per history otherwise all reviewed and reported negative  Exam:  General exam: awake, alert, NAD, cooperative.  Respiratory system: CTA. No increased work of breathing. Cardiovascular system: S1 & S2 heard, tachycardic. No JVD, murmurs, gallops, clicks or pedal edema. Gastrointestinal system: Abdomen is nondistended, soft and nontender. Normal bowel sounds heard. Central nervous system: Alert and oriented. No focal neurological deficits. Extremities: no CCE.  Data Reviewed: Basic Metabolic Panel:  Recent Labs  Lab 09/29/17 1124 09/29/17 1242 09/30/17 0355  NA 125*  --  134*  K 2.5*  --  3.0*  CL 71*  --  89*  CO2 30  --  32  GLUCOSE 143*  --  125*  BUN 13  --  16  CREATININE 0.60*  --  0.94  CALCIUM 7.5*  --  7.7*  MG  --  0.8* 1.8   Liver Function Tests: Recent Labs  Lab 09/29/17 1124 09/30/17 0355  AST 115* 90*  ALT 81* 58  ALKPHOS 89 71  BILITOT 2.1* 2.1*  PROT 6.7 5.5*  ALBUMIN 3.3* 2.8*   Recent Labs  Lab 09/29/17 1124    LIPASE 37   No results for input(s): AMMONIA in the last 168 hours. CBC: Recent Labs  Lab 09/29/17 1226 09/30/17 0355  WBC 13.9* 7.3  NEUTROABS  --  4.2  HGB 16.3 14.4  HCT 45.4 40.8  MCV 98.9 99.8  PLT 385 266   Cardiac Enzymes: Recent Labs  Lab 09/29/17 1124 09/29/17 1622 09/29/17 2157 09/30/17 0355  TROPONINI <0.03 <0.03 <0.03 <0.03   CBG (last 3)  No results for input(s): GLUCAP in the last 72 hours. Recent Results (from the past 240 hour(s))  MRSA PCR Screening     Status: Abnormal   Collection Time: 09/29/17  6:32 PM  Result Value Ref Range Status   MRSA by PCR POSITIVE (A) NEGATIVE Final    Comment:        The GeneXpert MRSA Assay (FDA approved for NASAL specimens only), is one component of a comprehensive MRSA colonization surveillance program. It is not intended to diagnose MRSA infection nor to guide or monitor treatment for MRSA infections. RESULT CALLED TO, READ BACK BY AND VERIFIED WITH: HOWARD,C AT 2107 ON 318.19 BY ISLEY,B Performed at [facility], [address] [place], [place] [address]      Studies: Dg Chest [facility] 1 View  Result Date: 09/30/2017 CLINICAL DATA:  Chest pain. Nausea. Emesis. Leukocytosis. Hypertension. EXAM: PORTABLE CHEST 1 VIEW COMPARISON:  09/29/2017. FINDINGS: Prior median sternotomy. Cardiomegaly. Low lung volumes with mild bibasilar atelectasis. Stable left base pleuroparenchymal thickening consistent scarring. No pneumothorax. IMPRESSION: Prior median sternotomy.  Stable cardiomegaly. 2. Low lung volumes with mild bibasilar atelectasis. Stable left base pleuroparenchymal thickening consistent scarring. Electronically Signed   By: Marcello Moores  Register   On: 09/30/2017 07:25   Dg Chest Port 1 View  Result Date: 09/29/2017 CLINICAL DATA:  Chest pain and hypertension EXAM: PORTABLE CHEST 1 VIEW COMPARISON:  September 23, 2014 FINDINGS: There is no edema or consolidation. Heart is upper normal in size with pulmonary vascularity within  normal limits. No adenopathy. Patient is status post median sternotomy. No bone lesions. IMPRESSION: No edema or consolidation.  Stable cardiac silhouette. Electronically Signed   By: Lowella Grip III M.D.   On: 09/29/2017 11:24   Scheduled Meds: . Chlorhexidine Gluconate Cloth  6 each Topical Q0600  . enoxaparin (LOVENOX) injection  150 mg Subcutaneous Q24H  . escitalopram  10 mg Oral Daily  . folic acid  1 mg Oral Daily  . LORazepam  0-4 mg Intravenous Q4H   Followed by  . [START ON 10/01/2017] LORazepam  0-4 mg Intravenous Q12H  . losartan  50 mg Oral Daily  . metoprolol tartrate  25 mg Oral QHS  . metoprolol tartrate  50 mg Oral Daily  . multivitamin with minerals  1 tablet Oral Daily  . mupirocin ointment  1 application  Nasal BID  . pantoprazole  40 mg Oral BID  . thiamine  100 mg Oral Daily   Or  . thiamine  100 mg Intravenous Daily  . warfarin  7.5 mg Oral Once  . Warfarin - Pharmacist Dosing Inpatient   Does not apply q1800   Continuous Infusions: . 0.9 % NaCl with KCl 40 mEq / L 60 mL/hr (09/30/17 1435)    Principal Problem:   Hyponatremia Active Problems:   HYPERTENSION, BENIGN   Complete transposition of great vessels   Nausea vomiting and diarrhea   Atrial flutter (HCC)   Hypokalemia   Hypomagnesemia   Alcohol abuse   Alcohol withdrawal (HCC)   Dehydration with hyponatremia   Depression   Elevated transaminase level   Protein-calorie malnutrition, severe (HCC)   Pulmonary hypertension (HCC)   Chronic systolic CHF (congestive heart failure) (HCC)   Tachycardia   SVT (supraventricular tachycardia) (HCC)   Hypocalcemia   Transaminitis   Chronic systolic congestive heart failure (HCC)   Atrial flutter with rapid ventricular response (HCC)   Generalized anxiety disorder   Transposition of great vessel   Dyslipidemia   GERD (gastroesophageal reflux disease)   Leukocytosis   Hyperbilirubinemia  Critical Care Time spent: 34 mins  Irwin Brakeman, MD,  FAAFP [facility] Pager [phone]  If 7PM-7AM, please contact night-coverage [email or web address] Password [email or web address] 09/30/2017, 2:41 PM    LOS: 1 day

## 2017-09-30 NOTE — Progress Notes (Addendum)
ANTICOAGULATION CONSULT NOTE - Initial Consult  Pharmacy Consult for Coumadin and lovenox Indication: atrial fibrillation  Allergies  Allergen Reactions  . Aspirin Other (See Comments)    Unknown    Patient Measurements: Height: 6\' 2"  (188 cm) Weight: 222 lb 14.2 oz (101.1 kg) IBW/kg (Calculated) : 82.2  Vital Signs: Temp: 97.8 F (36.6 C) (03/19 0800) Temp Source: Axillary (03/19 0800) BP: 123/86 (03/19 1200) Pulse Rate: 90 (03/19 1200)  Labs: Recent Labs    09/29/17 1124 09/29/17 1226 09/29/17 1622 09/29/17 2157 09/30/17 0355 09/30/17 1232  HGB  --  16.3  --   --  14.4  --   HCT  --  45.4  --   --  40.8  --   PLT  --  385  --   --  266  --   LABPROT  --   --   --   --   --  13.0  INR  --   --   --   --   --  0.99  CREATININE 0.60*  --   --   --  0.94  --   TROPONINI <0.03  --  <0.03 <0.03 <0.03  --     Estimated Creatinine Clearance: 134 mL/min (by C-G formula based on SCr of 0.94 mg/dL).   Medical History: Past Medical History:  Diagnosis Date  . Alcohol abuse   . Anxiety   . Atypical atrial flutter ([place])   . Complete transposition of great vessels   . Depression   . Dyslipidemia   . GERD (gastroesophageal reflux disease)   . Headache    "weekly" (09/24/2014)  . Heart murmur   . Hypertension   . 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 SVT (supraventricular tachycardia) ([place]) "several times in the last year" (09/24/2014)  . 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 escitalopram (LEXAPRO) 10 MG tablet Take 1 tablet (10 mg total) by mouth daily. 30 tablet 5 Past Week at Unknown time  . losartan (COZAAR) 50 MG tablet Take 1 tablet (50 mg total) by mouth daily. 90 tablet 3 09/29/2017 at Unknown time  . metoprolol tartrate (LOPRESSOR) 25 MG tablet Take 50 mg (2 tabs) by mouth in AM and 25 mg (1 tab) by mouth in PM. 270 tablet 3 09/29/2017 at  0900    Assessment: 42 y.o. male status post mustard repair for transposition of the greater vessels, history of atrial flutter, alcoholism, depression, systolic CHF and [facility] spot fever.  Admitted with ETOH intoxication Alcoholic with relapse. Also atrial flutter has not taken his meds or coumadin for a month . Cardiologists wants patient to restart coumadin and will bridge with lovenox until INR>2. Patient aware of risks with drinking and anticoagulation, plans to stop drinking alcohol.   Goal of Therapy:  INR 2-3 Monitor platelets by anticoagulation protocol: Yes   Plan:  Coumadin 7.5mg  x 1 today Lovenox 1.5mg /kg sq q24h until INR >2 PT-INR daily Monitor for S/S of bleeding  Isac Sarna, BS Vena [place], BCPS Clinical Pharmacist Pager [phone] 09/30/2017,1:39 PM

## 2017-10-01 DIAGNOSIS — I484 Atypical atrial flutter: Secondary | ICD-10-CM

## 2017-10-01 LAB — CBC WITH DIFFERENTIAL/PLATELET
Basophils Absolute: 0 10*3/uL (ref 0.0–0.1)
Basophils Relative: 0 %
Eosinophils Absolute: 0.1 10*3/uL (ref 0.0–0.7)
Eosinophils Relative: 1 %
HCT: 40 % (ref 39.0–52.0)
Hemoglobin: 13.4 g/dL (ref 13.0–17.0)
Lymphocytes Relative: 35 %
Lymphs Abs: 1.8 10*3/uL (ref 0.7–4.0)
MCH: 35.2 pg — ABNORMAL HIGH (ref 26.0–34.0)
MCHC: 33.5 g/dL (ref 30.0–36.0)
MCV: 105 fL — ABNORMAL HIGH (ref 78.0–100.0)
Monocytes Absolute: 0.4 10*3/uL (ref 0.1–1.0)
Monocytes Relative: 9 %
Neutro Abs: 2.7 10*3/uL (ref 1.7–7.7)
Neutrophils Relative %: 55 %
Platelets: 221 10*3/uL (ref 150–400)
RBC: 3.81 MIL/uL — ABNORMAL LOW (ref 4.22–5.81)
RDW: 13.7 % (ref 11.5–15.5)
WBC: 5 10*3/uL (ref 4.0–10.5)

## 2017-10-01 LAB — COMPREHENSIVE METABOLIC PANEL
ALT: 48 U/L (ref 17–63)
AST: 63 U/L — ABNORMAL HIGH (ref 15–41)
Albumin: 2.7 g/dL — ABNORMAL LOW (ref 3.5–5.0)
Alkaline Phosphatase: 58 U/L (ref 38–126)
Anion gap: 10 (ref 5–15)
BUN: 10 mg/dL (ref 6–20)
CO2: 29 mmol/L (ref 22–32)
Calcium: 7.9 mg/dL — ABNORMAL LOW (ref 8.9–10.3)
Chloride: 99 mmol/L — ABNORMAL LOW (ref 101–111)
Creatinine, Ser: 0.73 mg/dL (ref 0.61–1.24)
GFR calc Af Amer: >60 mL/min (ref >60)
GFR calc non Af Amer: >60 mL/min (ref >60)
Glucose, Bld: 91 mg/dL (ref 65–99)
Potassium: 3.6 mmol/L (ref 3.5–5.1)
Sodium: 138 mmol/L (ref 135–145)
Total Bilirubin: 1.1 mg/dL (ref 0.3–1.2)
Total Protein: 5.4 g/dL — ABNORMAL LOW (ref 6.5–8.1)

## 2017-10-01 LAB — MAGNESIUM: Magnesium: 1.6 mg/dL — ABNORMAL LOW (ref 1.7–2.4)

## 2017-10-01 LAB — CALCIUM, IONIZED: Calcium, Ionized, Serum: 4.4 mg/dL — ABNORMAL LOW (ref 4.5–5.6)

## 2017-10-01 LAB — PROTIME-INR
INR: 1.1
Prothrombin Time: 14.1 seconds (ref 11.4–15.2)

## 2017-10-01 MED ORDER — MAGNESIUM SULFATE 50 % IJ SOLN
3.0000 g | Freq: Once | INTRAMUSCULAR | Status: AC
  Administered 2017-10-01: 3 g via INTRAVENOUS
  Filled 2017-10-01: qty 6

## 2017-10-01 MED ORDER — ORAL CARE MOUTH RINSE
15.0000 mL | Freq: Two times a day (BID) | OROMUCOSAL | Status: DC
  Administered 2017-10-01 – 2017-10-02 (×3): 15 mL via OROMUCOSAL

## 2017-10-01 MED ORDER — WARFARIN SODIUM 7.5 MG PO TABS
7.5000 mg | ORAL_TABLET | Freq: Once | ORAL | Status: AC
  Administered 2017-10-01: 7.5 mg via ORAL
  Filled 2017-10-01: qty 1

## 2017-10-01 NOTE — Progress Notes (Signed)
ANTICOAGULATION CONSULT NOTE - follow up [place] for Coumadin and lovenox Indication: atrial fibrillation  Allergies  Allergen Reactions  . Aspirin Other (See Comments)    Unknown    Patient Measurements: Height: 6\' 2"  (188 cm) Weight: 226 lb 3.1 oz (102.6 kg) IBW/kg (Calculated) : 82.2  Vital Signs: Temp: 98.2 F (36.8 C) (03/20 0725) Temp Source: Axillary (03/20 0725) BP: 123/78 (03/20 0600)  Labs: Recent Labs    09/29/17 1124  09/29/17 1226 09/29/17 1622 09/29/17 2157 09/30/17 0355 09/30/17 1232 10/01/17 0420  HGB  --    < > 16.3  --   --  14.4  --  13.4  HCT  --   --  45.4  --   --  40.8  --  40.0  PLT  --   --  385  --   --  266  --  221  LABPROT  --   --   --   --   --   --  13.0 14.1  INR  --   --   --   --   --   --  0.99 1.10  CREATININE 0.60*  --   --   --   --  0.94  --  0.73  TROPONINI <0.03  --   --  <0.03 <0.03 <0.03  --   --    < > = values in this interval not displayed.    Estimated Creatinine Clearance: 158.5 mL/min (by C-G formula based on SCr of 0.73 mg/dL).   Medical History: Past Medical History:  Diagnosis Date  . Alcohol abuse   . Anxiety   . Atypical atrial flutter ([place])   . Complete transposition of great vessels   . Depression   . Dyslipidemia   . GERD (gastroesophageal reflux disease)   . Headache    "weekly" (09/24/2014)  . Heart murmur   . Hypertension   . 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 SVT (supraventricular tachycardia) ([place]) "several times in the last year" (09/24/2014)  . 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 escitalopram (LEXAPRO) 10 MG tablet Take 1 tablet (10 mg total) by mouth daily. 30 tablet 5 Past Week at Unknown time  . losartan (COZAAR) 50 MG tablet Take 1 tablet (50 mg total) by mouth daily. 90 tablet 3 09/29/2017 at Unknown time  . metoprolol tartrate (LOPRESSOR) 25  MG tablet Take 50 mg (2 tabs) by mouth in AM and 25 mg (1 tab) by mouth in PM. 270 tablet 3 09/29/2017 at 0900    Assessment: 42 y.o. male status post mustard repair for transposition of the greater vessels, history of atrial flutter, alcoholism, depression, systolic CHF and [facility] spot fever.  Admitted with ETOH intoxication Alcoholic with relapse. Also atrial flutter has not taken his meds or coumadin for a month . Cardiologists wants patient to restart coumadin and will bridge with lovenox until INR>2. Patient aware of risks with drinking and anticoagulation, plans to stop drinking alcohol. INR <1.5.  Goal of Therapy:  INR 2-3 Monitor platelets by anticoagulation protocol: Yes   Plan:  Coumadin 7.5mg  x 1 today Continue Lovenox 1.5mg /kg sq q24h until INR >2 PT-INR daily Monitor for S/S of bleeding  Isac Sarna, BS [place], BCPS Clinical Pharmacist Pager [phone] 10/01/2017,8:26 AM

## 2017-10-01 NOTE — Progress Notes (Signed)
PROGRESS NOTE   Matthew [name]  QQV:[id number]  DOB: 07/11/1976  DOA: 09/29/2017 PCP: Mikey Kirschner, MD   Brief Admission Hx: Matthew [name] is a 42 y.o. male status post mustard repair for transposition of the great vessels,history of atrial flutter, alcoholism, depression, systolic CHF and [facility] spot fever. He presented to ED with complaints of left-sided chest pain that started at 3 AM this morning.  He has also been binge drinking and is being admitted for alcohol detox management.    MDM/Assessment & Plan:   1. Acute alcohol withdrawal - Pt has been having some withdrawal hallucinations but improved with ativan.  He is sleeping better now. He has been seen by TTS and social worker has been consulted for rehab options.  Continue supportive care and CIWA protocol with lorazepam ordered scheduled and as needed.   Social worker consult for rehab treatment options.  He had an 8 month rehab stint in [place] and may benefit from more alcohol rehab.  2. Hypokalemia - added potassium to IVFs.  3. Hypomagnesemia - magnesium being repleted.  IV magnesium ordered and recheck in AM.  4. Alcohol abuse - Pt intends to re-enroll in AA and would like rehab consideration, Social worker consulted.  Ordered thiamine, MVI and folic acid. 5. Tachycardia - likely exacerbated by dehydration, reordered home beta blockers and treating supportively.  Monitor telemetry.   6. Chronic systolic CHF - appears not to be in acute exacerbation, He is clinically dehydrated.  Follow clinically.  7. Chronic Atrial Flutter - resume home medications and follow.  Cardiology restarted on lovenox / warfarin.  8. Chest pain - He does have some EKG changes. Will check troponins serially and cardiology has been consulted.   9. GERD - protonix ordered for GI protection.  10. Transaminitis - likely secondary to recent binge alcohol drinking, will follow.  11. Depression / GAD - continue home lexapro.   Consult TTS.  12. Leukocytosis - Resolved.  Likely reactive from recent vomiting, has returned to normal now.  No signs / symptoms of infection found.  13. N/V/D - likely secondary to acute alcohol withdrawal, treating as noted above.  14. Hyponatremia - Resolved now.  Hypovolemic, treating with IV normal saline infusion.    DVT Prophylaxis: lovenox Code Status: Full   Family Communication: bedside  Disposition Plan: pending correction of electrolytes   Subjective: Pt reported to have some hallucinations overnight.    Objective: Vitals:   10/01/17 0300 10/01/17 0400 10/01/17 0500 10/01/17 0600  BP: 128/78 110/84 117/71 123/78  Pulse:      Resp: 18   18  Temp:  97.6 F (36.4 C)    TempSrc:  Oral    SpO2:      Weight:   102.6 kg (226 lb 3.1 oz)   Height:        Intake/Output Summary (Last 24 hours) at 10/01/2017 0640 Last data filed at 10/01/2017 0600 Gross per 24 hour  Intake 1568.75 ml  Output 350 ml  Net 1218.75 ml   Filed Weights   09/29/17 1037 09/30/17 0500 10/01/17 0500  Weight: 104.3 kg (230 lb) 101.1 kg (222 lb 14.2 oz) 102.6 kg (226 lb 3.1 oz)     REVIEW OF SYSTEMS  As per history otherwise all reviewed and reported negative  Exam:  General exam: awake, alert, NAD, cooperative.  Respiratory system: CTA. No increased work of breathing. Cardiovascular system: S1 & S2 heard, tachycardic. No JVD, murmurs, gallops, clicks or pedal edema.  Gastrointestinal system: Abdomen is nondistended, soft and nontender. Normal bowel sounds heard. Central nervous system: Alert and oriented. No focal neurological deficits. Extremities: no CCE.  Data Reviewed: Basic Metabolic Panel: Recent Labs  Lab 09/29/17 1124 09/29/17 1242 09/30/17 0355  NA 125*  --  134*  K 2.5*  --  3.0*  CL 71*  --  89*  CO2 30  --  32  GLUCOSE 143*  --  125*  BUN 13  --  16  CREATININE 0.60*  --  0.94  CALCIUM 7.5*  --  7.7*  MG  --  0.8* 1.8   Liver Function Tests: Recent Labs  Lab  09/29/17 1124 09/30/17 0355  AST 115* 90*  ALT 81* 58  ALKPHOS 89 71  BILITOT 2.1* 2.1*  PROT 6.7 5.5*  ALBUMIN 3.3* 2.8*   Recent Labs  Lab 09/29/17 1124  LIPASE 37   No results for input(s): AMMONIA in the last 168 hours. CBC: Recent Labs  Lab 09/29/17 1226 09/30/17 0355 10/01/17 0420  WBC 13.9* 7.3 5.0  NEUTROABS  --  4.2 2.7  HGB 16.3 14.4 13.4  HCT 45.4 40.8 40.0  MCV 98.9 99.8 105.0*  PLT 385 266 221   Cardiac Enzymes: Recent Labs  Lab 09/29/17 1124 09/29/17 1622 09/29/17 2157 09/30/17 0355  TROPONINI <0.03 <0.03 <0.03 <0.03   CBG (last 3)  No results for input(s): GLUCAP in the last 72 hours. Recent Results (from the past 240 hour(s))  MRSA PCR Screening     Status: Abnormal   Collection Time: 09/29/17  6:32 PM  Result Value Ref Range Status   MRSA by PCR POSITIVE (A) NEGATIVE Final    Comment:        The GeneXpert MRSA Assay (FDA approved for NASAL specimens only), is one component of a comprehensive MRSA colonization surveillance program. It is not intended to diagnose MRSA infection nor to guide or monitor treatment for MRSA infections. RESULT CALLED TO, READ BACK BY AND VERIFIED WITH: HOWARD,C AT 2107 ON 318.19 BY ISLEY,B Performed at [facility], [address], [place], [place] [address]      Studies: Dg Chest [facility] 1 View  Result Date: 09/30/2017 CLINICAL DATA:  Chest pain. Nausea. Emesis. Leukocytosis. Hypertension. EXAM: PORTABLE CHEST 1 VIEW COMPARISON:  09/29/2017. FINDINGS: Prior median sternotomy. Cardiomegaly. Low lung volumes with mild bibasilar atelectasis. Stable left base pleuroparenchymal thickening consistent scarring. No pneumothorax. IMPRESSION: Prior median sternotomy.  Stable cardiomegaly. 2. Low lung volumes with mild bibasilar atelectasis. Stable left base pleuroparenchymal thickening consistent scarring. Electronically Signed   By: Marcello Moores  Register   On: 09/30/2017 07:25   Dg Chest Port 1 View  Result Date:  09/29/2017 CLINICAL DATA:  Chest pain and hypertension EXAM: PORTABLE CHEST 1 VIEW COMPARISON:  September 23, 2014 FINDINGS: There is no edema or consolidation. Heart is upper normal in size with pulmonary vascularity within normal limits. No adenopathy. Patient is status post median sternotomy. No bone lesions. IMPRESSION: No edema or consolidation.  Stable cardiac silhouette. Electronically Signed   By: Lowella Grip III M.D.   On: 09/29/2017 11:24   Scheduled Meds: . Chlorhexidine Gluconate Cloth  6 each Topical Q0600  . enoxaparin (LOVENOX) injection  150 mg Subcutaneous Q24H  . escitalopram  10 mg Oral Daily  . folic acid  1 mg Oral Daily  . LORazepam  0-4 mg Intravenous Q4H   Followed by  . LORazepam  0-4 mg Intravenous Q12H  . losartan  50 mg Oral Daily  .  metoprolol tartrate  25 mg Oral QHS  . metoprolol tartrate  50 mg Oral Daily  . multivitamin with minerals  1 tablet Oral Daily  . mupirocin ointment  1 application Nasal BID  . pantoprazole  40 mg Oral BID  . thiamine  100 mg Oral Daily   Or  . thiamine  100 mg Intravenous Daily  . Warfarin - Pharmacist Dosing Inpatient   Does not apply q1800   Continuous Infusions: . 0.9 % NaCl with KCl 40 mEq / L 60 mL/hr (09/30/17 2057)    Principal Problem:   Hyponatremia Active Problems:   HYPERTENSION, BENIGN   Complete transposition of great vessels   Nausea vomiting and diarrhea   Atrial flutter (HCC)   Hypokalemia   Hypomagnesemia   Alcohol abuse   Alcohol withdrawal (HCC)   Dehydration with hyponatremia   Depression   Elevated transaminase level   Protein-calorie malnutrition, severe (HCC)   Pulmonary hypertension (HCC)   Chronic systolic CHF (congestive heart failure) (HCC)   Tachycardia   SVT (supraventricular tachycardia) (HCC)   Hypocalcemia   Transaminitis   Chronic systolic congestive heart failure (HCC)   Atrial flutter with rapid ventricular response (HCC)   Generalized anxiety disorder   Transposition of  great vessel   Dyslipidemia   GERD (gastroesophageal reflux disease)   Leukocytosis   Hyperbilirubinemia   Abnormal EKG   Nonspecific chest pain  Critical Care Time spent: 32 mins  Irwin Brakeman, MD, FAAFP [facility] Pager [phone]  If 7PM-7AM, please contact night-coverage [email or web address] Password [email or web address] 10/01/2017, 6:40 AM    LOS: 2 days

## 2017-10-01 NOTE — Progress Notes (Signed)
Progress Note  Patient Name: Matthew [name] Date of Encounter: 10/01/2017  Primary Cardiologist: Dr Harrington Challenger  Subjective   No chest pain or dyspnea long discussion with mother regarding ETOH and long term care of congenital heart disease   Inpatient Medications    Scheduled Meds: . Chlorhexidine Gluconate Cloth  6 each Topical Q0600  . enoxaparin (LOVENOX) injection  150 mg Subcutaneous Q24H  . escitalopram  10 mg Oral Daily  . folic acid  1 mg Oral Daily  . LORazepam  0-4 mg Intravenous Q4H   Followed by  . LORazepam  0-4 mg Intravenous Q12H  . losartan  50 mg Oral Daily  . metoprolol tartrate  25 mg Oral QHS  . metoprolol tartrate  50 mg Oral Daily  . multivitamin with minerals  1 tablet Oral Daily  . mupirocin ointment  1 application Nasal BID  . pantoprazole  40 mg Oral BID  . thiamine  100 mg Oral Daily   Or  . thiamine  100 mg Intravenous Daily  . warfarin  7.5 mg Oral Once  . Warfarin - Pharmacist Dosing Inpatient   Does not apply q1800   Continuous Infusions: . 0.9 % NaCl with KCl 40 mEq / L 50 mL/hr (10/01/17 0744)  . magnesium sulfate 1 - 4 g bolus IVPB     PRN Meds: ibuprofen, LORazepam **OR** LORazepam, ondansetron **OR** ondansetron (ZOFRAN) IV, phenol, traMADol, traZODone   Vital Signs    Vitals:   10/01/17 0400 10/01/17 0500 10/01/17 0600 10/01/17 0725  BP: 110/84 117/71 123/78   Pulse:      Resp:   18 (!) 23  Temp: 97.6 F (36.4 C)   98.2 F (36.8 C)  TempSrc: Oral   Axillary  SpO2:      Weight:  226 lb 3.1 oz (102.6 kg)    Height:        Intake/Output Summary (Last 24 hours) at 10/01/2017 0840 Last data filed at 10/01/2017 0600 Gross per 24 hour  Intake 1568.75 ml  Output 350 ml  Net 1218.75 ml   Filed Weights   09/29/17 1037 09/30/17 0500 10/01/17 0500  Weight: 230 lb (104.3 kg) 222 lb 14.2 oz (101.1 kg) 226 lb 3.1 oz (102.6 kg)    Telemetry    NSR has converted from flutter - Personally Reviewed  ECG    Flutter with RVH  - Personally Reviewed  Physical Exam   Affect appropriate Healthy:  appears stated age HEENT: normal Neck supple with no adenopathy JVP normal no bruits no thyromegaly Lungs clear with no wheezing and good diaphragmatic motion Heart:  S1/S2 no murmur, no rub, gallop or click PMI normal Abdomen: benighn, BS positve, no tenderness, no AAA no bruit.  No HSM or HJR Distal pulses intact with no bruits No edema Neuro non-focal Skin warm and dry No muscular weakness   Labs    Chemistry Recent Labs  Lab 09/29/17 1124 09/30/17 0355 10/01/17 0420  NA 125* 134* 138  K 2.5* 3.0* 3.6  CL 71* 89* 99*  CO2 30 32 29  GLUCOSE 143* 125* 91  BUN 13 16 10   CREATININE 0.60* 0.94 0.73  CALCIUM 7.5* 7.7* 7.9*  PROT 6.7 5.5* 5.4*  ALBUMIN 3.3* 2.8* 2.7*  AST 115* 90* 63*  ALT 81* 58 48  ALKPHOS 89 71 58  BILITOT 2.1* 2.1* 1.1  GFRNONAA >60 >60 >60  GFRAA >60 >60 >60  ANIONGAP <20* 13 10     Hematology Recent Labs  Lab 09/29/17 1226 09/30/17 0355 10/01/17 0420  WBC 13.9* 7.3 5.0  RBC 4.59 4.09* 3.81*  HGB 16.3 14.4 13.4  HCT 45.4 40.8 40.0  MCV 98.9 99.8 105.0*  MCH 35.5* 35.2* 35.2*  MCHC 35.9 35.3 33.5  RDW 13.3 12.5 13.7  PLT 385 266 221    Cardiac Enzymes Recent Labs  Lab 09/29/17 1124 09/29/17 1622 09/29/17 2157 09/30/17 0355  TROPONINI <0.03 <0.03 <0.03 <0.03   No results for input(s): TROPIPOC in the last 168 hours.   BNP Recent Labs  Lab 09/29/17 1124  BNP 50.0     DDimer No results for input(s): DDIMER in the last 168 hours.   Radiology    Dg Chest Port 1 View  Result Date: 09/30/2017 CLINICAL DATA:  Chest pain. Nausea. Emesis. Leukocytosis. Hypertension. EXAM: PORTABLE CHEST 1 VIEW COMPARISON:  09/29/2017. FINDINGS: Prior median sternotomy. Cardiomegaly. Low lung volumes with mild bibasilar atelectasis. Stable left base pleuroparenchymal thickening consistent scarring. No pneumothorax. IMPRESSION: Prior median sternotomy.  Stable cardiomegaly.  2. Low lung volumes with mild bibasilar atelectasis. Stable left base pleuroparenchymal thickening consistent scarring. Electronically Signed   By: Marcello Moores  Register   On: 09/30/2017 07:25   Dg Chest Port 1 View  Result Date: 09/29/2017 CLINICAL DATA:  Chest pain and hypertension EXAM: PORTABLE CHEST 1 VIEW COMPARISON:  September 23, 2014 FINDINGS: There is no edema or consolidation. Heart is upper normal in size with pulmonary vascularity within normal limits. No adenopathy. Patient is status post median sternotomy. No bone lesions. IMPRESSION: No edema or consolidation.  Stable cardiac silhouette. Electronically Signed   By: Lowella Grip III M.D.   On: 09/29/2017 11:24    Cardiac Studies   Echo 08/2015 systemic ventricle EF 35%  Patient Profile     42 y.o. male with transposition and mustard procedure in first year of life. Admitted with ETOH intoxication Alcoholic with relapse. Also atrial flutter has not taken his meds or coumadin for a month   Assessment & Plan    1. Flutter NSR this am continue beta blocker get back on coumadin with lovenox bridge 2. Congenital heart disease. Needs referral to [facility] as outpatient for cardiac MRI and pediatric echo . Known systemic ventricular dysfunction. Continue Beta blocker and ARB.  3. ETOH:  Had 8 month rehab in [place] in past. Needs another stint plan per Primary service He works in [employer] and has 40 yo twins and 40 yo that he supports Concerned about loosing job if he goes to inpatient rehab. May be able to do counseling and daily AA meeting lunch time to be able to not lose job. Needs Rx for significant depression as well   For questions or updates, please contact [facility] Please consult [email or web address] for contact info under Cardiology/STEMI.      Signed, [name], MD  10/01/2017, 8:40 AM

## 2017-10-02 LAB — CBC WITH DIFFERENTIAL/PLATELET
Basophils Absolute: 0 10*3/uL (ref 0.0–0.1)
Basophils Relative: 0 %
Eosinophils Absolute: 0.1 10*3/uL (ref 0.0–0.7)
Eosinophils Relative: 2 %
HCT: 38.2 % — ABNORMAL LOW (ref 39.0–52.0)
Hemoglobin: 12.9 g/dL — ABNORMAL LOW (ref 13.0–17.0)
Lymphocytes Relative: 27 %
Lymphs Abs: 1.6 10*3/uL (ref 0.7–4.0)
MCH: 35.3 pg — ABNORMAL HIGH (ref 26.0–34.0)
MCHC: 33.8 g/dL (ref 30.0–36.0)
MCV: 104.7 fL — ABNORMAL HIGH (ref 78.0–100.0)
Monocytes Absolute: 0.6 10*3/uL (ref 0.1–1.0)
Monocytes Relative: 10 %
Neutro Abs: 3.7 10*3/uL (ref 1.7–7.7)
Neutrophils Relative %: 61 %
Platelets: 212 10*3/uL (ref 150–400)
RBC: 3.65 MIL/uL — ABNORMAL LOW (ref 4.22–5.81)
RDW: 13.5 % (ref 11.5–15.5)
WBC: 6.1 10*3/uL (ref 4.0–10.5)

## 2017-10-02 LAB — COMPREHENSIVE METABOLIC PANEL
ALT: 49 U/L (ref 17–63)
AST: 54 U/L — ABNORMAL HIGH (ref 15–41)
Albumin: 2.7 g/dL — ABNORMAL LOW (ref 3.5–5.0)
Alkaline Phosphatase: 59 U/L (ref 38–126)
Anion gap: 8 (ref 5–15)
BUN: 9 mg/dL (ref 6–20)
CO2: 28 mmol/L (ref 22–32)
Calcium: 8 mg/dL — ABNORMAL LOW (ref 8.9–10.3)
Chloride: 101 mmol/L (ref 101–111)
Creatinine, Ser: 0.73 mg/dL (ref 0.61–1.24)
GFR calc Af Amer: >60 mL/min (ref >60)
GFR calc non Af Amer: >60 mL/min (ref >60)
Glucose, Bld: 102 mg/dL — ABNORMAL HIGH (ref 65–99)
Potassium: 4.2 mmol/L (ref 3.5–5.1)
Sodium: 137 mmol/L (ref 135–145)
Total Bilirubin: 0.7 mg/dL (ref 0.3–1.2)
Total Protein: 5.6 g/dL — ABNORMAL LOW (ref 6.5–8.1)

## 2017-10-02 LAB — PROTIME-INR
INR: 1.72
Prothrombin Time: 20 seconds — ABNORMAL HIGH (ref 11.4–15.2)

## 2017-10-02 LAB — MAGNESIUM: Magnesium: 1.5 mg/dL — ABNORMAL LOW (ref 1.7–2.4)

## 2017-10-02 MED ORDER — WARFARIN SODIUM 5 MG PO TABS
5.0000 mg | ORAL_TABLET | Freq: Once | ORAL | Status: AC
  Administered 2017-10-02: 5 mg via ORAL
  Filled 2017-10-02: qty 1

## 2017-10-02 MED ORDER — MAGNESIUM SULFATE 4 GM/100ML IV SOLN
4.0000 g | Freq: Once | INTRAVENOUS | Status: AC
  Administered 2017-10-02: 4 g via INTRAVENOUS
  Filled 2017-10-02: qty 100

## 2017-10-02 NOTE — Evaluation (Signed)
Physical Therapy Evaluation Patient Details Name: Matthew [name] MRN: [id number] DOB: [birth year] Today's Date: 10/02/2017   History of Present Illness  42 yo male with onset of transaminitis and withdrawal from EtOH upon admission was noted to have low Na+ and K+.  Also SOB, elevated troponins, leukocytosis and atelectasis upon admission.  PMHx:  CHF, [place] Spotted Fever, a-flutter  Clinical Impression  Pt is up to walk with assistance, but is concerned for instability he feels with HHA and cues to navigate with cannula.  Pt is ready to try a cane but may still need to upgrade to a walker depending on his abilities tomorrow.  Follow acutely to progress home with as little assistance as possible.    Follow Up Recommendations Outpatient PT;Supervision for mobility/OOB    Equipment Recommendations  Other (comment)(will recommend SPC if it looks safe on 3/22)    Recommendations for Other Services       Precautions / Restrictions Precautions Precautions: Fall(telemetry, previous sternotomy) Restrictions Weight Bearing Restrictions: No      Mobility  Bed Mobility Overal bed mobility: Needs Assistance Bed Mobility: Supine to Sit;Sit to Supine     Supine to sit: Min guard;Min assist Sit to supine: Min guard;Min assist   General bed mobility comments: able to scoot to EOB but needs a little support for forward momentum on trunk  Transfers Overall transfer level: Needs assistance Equipment used: 1 person hand held assist Transfers: Sit to/from Stand Sit to Stand: Min assist         General transfer comment: minor help to stand and control O2 line to avoid a trip  Ambulation/Gait Ambulation/Gait assistance: Min guard;Min assist Ambulation Distance (Feet): 80 Feet(40 x 2) Assistive device: 1 person hand held assist Gait Pattern/deviations: Step-to pattern;Step-through pattern;Shuffle;Decreased stride length;Wide base of support;Drifts right/left Gait  velocity: reduced Gait velocity interpretation: Below normal speed for age/gender General Gait Details: lateral instability in standing   Stairs Stairs: (deferred to tomorrow)          Wheelchair Mobility    Modified Rankin (Stroke Patients Only)       Balance Overall balance assessment: Mild deficits observed, not formally tested                                           Pertinent Vitals/Pain Pain Assessment: No/denies pain    Home Living Family/patient expects to be discharged to:: Private residence Living Arrangements: Alone Available Help at Discharge: Family;Available PRN/intermittently Type of Home: House Home Access: Stairs to enter Entrance Stairs-Rails: Right;Left;Can reach both Entrance Stairs-Number of Steps: 13 Home Layout: One level Home Equipment: None Additional Comments: thinks he might have a cane but not sure at all    Prior Function Level of Independence: Independent         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[employer], mining   Dominant Hand: Right    Extremity/Trunk Assessment   Upper Extremity Assessment Upper Extremity Assessment: Overall WFL for tasks assessed    Lower Extremity Assessment Lower Extremity Assessment: Overall WFL for tasks assessed    Cervical / Trunk Assessment Cervical / Trunk Assessment: Normal  Communication   Communication: No difficulties  Cognition Arousal/Alertness: Awake/alert Behavior During Therapy: Flat affect;Impulsive Overall Cognitive Status: No family/caregiver present to determine baseline cognitive functioning  General Comments: Pt seems unsure of his abilities and not asking about how to navigate with unsteady legs.      General Comments General comments (skin integrity, edema, etc.): fair standing but fair- to fair dynamic balance    Exercises     Assessment/Plan    PT Assessment Patient needs continued PT  services  PT Problem List Decreased range of motion;Decreased activity tolerance;Decreased balance;Decreased mobility;Decreased coordination;Decreased cognition;Decreased knowledge of use of DME;Decreased safety awareness;Decreased knowledge of precautions       PT Treatment Interventions Gait training;DME instruction;Stair training;Functional mobility training;Therapeutic activities;Therapeutic exercise;Balance training;Neuromuscular re-education;Patient/family education    PT Goals (Current goals can be found in the Care Plan section)  Acute Rehab PT Goals Patient Stated Goal: to walk safely and get home PT Goal Formulation: With patient Time For Goal Achievement: 10/16/17 Potential to Achieve Goals: Good    Frequency Min 4X/week   Barriers to discharge Decreased caregiver support;Inaccessible home environment home alone with full flight to climb or can go to stay with family    Co-evaluation               AM-PAC PT "6 Clicks" Daily Activity  Outcome Measure Difficulty turning over in bed (including adjusting bedclothes, sheets and blankets)?: None Difficulty moving from lying on back to sitting on the side of the bed? : A Little Difficulty sitting down on and standing up from a chair with arms (e.g., wheelchair, bedside commode, etc,.)?: Unable Help needed moving to and from a bed to chair (including a wheelchair)?: A Little Help needed walking in hospital room?: A Little Help needed climbing 3-5 steps with a railing? : A Lot 6 Click Score: 16    End of Session Equipment Utilized During Treatment: Gait belt;Oxygen Activity Tolerance: Patient tolerated treatment well;Patient limited by fatigue Patient left: in bed;with call bell/phone within reach(bed alarm off when PT arrived) Nurse Communication: Mobility status PT Visit Diagnosis: Unsteadiness on feet (R26.81);Difficulty in walking, not elsewhere classified (R26.2)    Time: 1620-1650 PT Time Calculation (min)  (ACUTE ONLY): 30 min   Charges:   PT Evaluation $PT Eval Moderate Complexity: 1 Mod PT Treatments $Gait Training: 8-22 mins   PT G Codes:   PT G-Codes **NOT FOR INPATIENT CLASS** Functional Assessment Tool Used: AM-PAC 6 Clicks Basic Mobility    Ramond Dial 10/02/2017, 5:20 PM   Mee Hives, PT MS Acute Rehab Dept. Number: [place] and [place]

## 2017-10-02 NOTE — Progress Notes (Signed)
PROGRESS NOTE   Matthew [name]  IHK:[id number]  DOB: 1976/01/29  DOA: 09/29/2017 PCP: Mikey Kirschner, MD   Brief Admission Hx: Matthew [name] is a 42 y.o. male status post mustard repair for transposition of the great vessels,history of atrial flutter, alcoholism, depression, systolic CHF and [facility] spot fever. He presented to ED with complaints of left-sided chest pain that started at 3 AM this morning.  He has also been binge drinking and is being admitted for alcohol detox management.    MDM/Assessment & Plan:   1. Acute alcohol withdrawal - Pt has been having some withdrawal hallucinations but much less and has not required nearly as much lorazepam.  He is sleeping better now. He has been seen by TTS and social worker has been consulted for rehab options.  He has chosen for outpatient rehab so that he can keep his job.  Continue supportive care and CIWA protocol.   He should be stable to discharge home tomorrow.  Social worker consulted for rehab treatment options.   2. Hypokalemia - added potassium to IVFs.  Replacing magnesium.  3. Hypomagnesemia - magnesium being repleted.  IV magnesium ordered and recheck in AM.  4. Alcohol abuse - Pt intends to re-enroll in AA and outpatient rehab.  Social worker consulted.  Ordered thiamine, MVI and folic acid. 5. Tachycardia - RESOLVED.  Likely exacerbated by dehydration, reordered home beta blockers and treating supportively.  Monitor telemetry.   6. Chronic systolic CHF - appears not to be in acute exacerbation, He appears well compensated.  Follow clinically.  7. Chronic Atrial Flutter - resume home medications and follow.  Cardiology restarted on lovenox / warfarin. Hopefully could go home tomorrow on warfarin if therapeutic without lovenox bridge. 8. Chest pain - He initially had EKG changes but troponins have been negative.    9. GERD - protonix ordered for GI protection.  10. Transaminitis - likely secondary to  recent binge alcohol drinking.   11. Depression / GAD - continue home lexapro.  Consulted TTS.  He needs outpatient psychiatry follow up.   12. Leukocytosis - Resolved.  Likely reactive from recent vomiting, has returned to normal now.  No signs / symptoms of infection found.  13. N/V/D - likely secondary to acute alcohol withdrawal, treating as noted above.  14. Hyponatremia - Resolved now.  Hypovolemic, treating with IV normal saline infusion.    DVT Prophylaxis: lovenox Code Status: Full   Family Communication: bedside  Disposition Plan: planning home tomorrow with outpatient AA and rehab treatment after correction of electrolytes   Subjective: Pt having less hallucinations and mentally improving.  Feeling a little better today but very exhausted.    Objective: Vitals:   10/02/17 0400 10/02/17 0500 10/02/17 0600 10/02/17 0733  BP: 109/75 (!) 123/94 104/78   Pulse: 81 78 76 79  Resp: 20 (!) 22 (!) 22 20  Temp: 98.1 F (36.7 C)   99.1 F (37.3 C)  TempSrc: Oral   Axillary  SpO2: (!) 69% 95% 96% 95%  Weight:  103.2 kg (227 lb 8.2 oz)    Height:        Intake/Output Summary (Last 24 hours) at 10/02/2017 1034 Last data filed at 10/02/2017 0300 Gross per 24 hour  Intake 1787.33 ml  Output 1100 ml  Net 687.33 ml   Filed Weights   09/30/17 0500 10/01/17 0500 10/02/17 0500  Weight: 101.1 kg (222 lb 14.2 oz) 102.6 kg (226 lb 3.1 oz) 103.2 kg (227 lb  8.2 oz)     REVIEW OF SYSTEMS  As per history otherwise all reviewed and reported negative  Exam:  General exam: awake, alert, NAD, cooperative.  Respiratory system: CTA. No increased work of breathing. Cardiovascular system: S1 & S2 heard, tachycardic. No JVD, murmurs, gallops, clicks or pedal edema. Gastrointestinal system: Abdomen is nondistended, soft and nontender. Normal bowel sounds heard. Central nervous system: Alert and oriented. No focal neurological deficits. Extremities: no CCE.  Data Reviewed: Basic Metabolic  Panel: Recent Labs  Lab 09/29/17 1124 09/29/17 1242 09/30/17 0355 10/01/17 0420 10/02/17 0533  NA 125*  --  134* 138 137  K 2.5*  --  3.0* 3.6 4.2  CL 71*  --  89* 99* 101  CO2 30  --  32 29 28  GLUCOSE 143*  --  125* 91 102*  BUN 13  --  16 10 9   CREATININE 0.60*  --  0.94 0.73 0.73  CALCIUM 7.5*  --  7.7* 7.9* 8.0*  MG  --  0.8* 1.8 1.6* 1.5*   Liver Function Tests: Recent Labs  Lab 09/29/17 1124 09/30/17 0355 10/01/17 0420 10/02/17 0533  AST 115* 90* 63* 54*  ALT 81* 58 48 49  ALKPHOS 89 71 58 59  BILITOT 2.1* 2.1* 1.1 0.7  PROT 6.7 5.5* 5.4* 5.6*  ALBUMIN 3.3* 2.8* 2.7* 2.7*   Recent Labs  Lab 09/29/17 1124  LIPASE 37   No results for input(s): AMMONIA in the last 168 hours. CBC: Recent Labs  Lab 09/29/17 1226 09/30/17 0355 10/01/17 0420 10/02/17 0533  WBC 13.9* 7.3 5.0 6.1  NEUTROABS  --  4.2 2.7 3.7  HGB 16.3 14.4 13.4 12.9*  HCT 45.4 40.8 40.0 38.2*  MCV 98.9 99.8 105.0* 104.7*  PLT 385 266 221 212   Cardiac Enzymes: Recent Labs  Lab 09/29/17 1124 09/29/17 1622 09/29/17 2157 09/30/17 0355  TROPONINI <0.03 <0.03 <0.03 <0.03   CBG (last 3)  No results for input(s): GLUCAP in the last 72 hours. Recent Results (from the past 240 hour(s))  MRSA PCR Screening     Status: Abnormal   Collection Time: 09/29/17  6:32 PM  Result Value Ref Range Status   MRSA by PCR POSITIVE (A) NEGATIVE Final    Comment:        The GeneXpert MRSA Assay (FDA approved for NASAL specimens only), is one component of a comprehensive MRSA colonization surveillance program. It is not intended to diagnose MRSA infection nor to guide or monitor treatment for MRSA infections. RESULT CALLED TO, READ BACK BY AND VERIFIED WITH: [place],C AT 2107 ON 318.19 BY ISLEY,B Performed at [facility], [address], [place], [place] [address]      Studies: No results found. Scheduled Meds: . Chlorhexidine Gluconate Cloth  6 each Topical Q0600  . enoxaparin (LOVENOX)  injection  150 mg Subcutaneous Q24H  . escitalopram  10 mg Oral Daily  . folic acid  1 mg Oral Daily  . LORazepam  0-4 mg Intravenous Q12H  . losartan  50 mg Oral Daily  . mouth rinse  15 mL Mouth Rinse BID  . metoprolol tartrate  25 mg Oral QHS  . metoprolol tartrate  50 mg Oral Daily  . multivitamin with minerals  1 tablet Oral Daily  . mupirocin ointment  1 application Nasal BID  . pantoprazole  40 mg Oral BID  . thiamine  100 mg Oral Daily   Or  . thiamine  100 mg Intravenous Daily  . warfarin  5 mg Oral Once  . Warfarin - Pharmacist Dosing Inpatient   Does not apply q1800   Continuous Infusions:   Principal Problem:   Hyponatremia Active Problems:   HYPERTENSION, BENIGN   Complete transposition of great vessels   Nausea vomiting and diarrhea   Atrial flutter (HCC)   Hypokalemia   Hypomagnesemia   Alcohol abuse   Alcohol withdrawal (HCC)   Dehydration with hyponatremia   Depression   Elevated transaminase level   Protein-calorie malnutrition, severe (HCC)   Pulmonary hypertension (HCC)   Chronic systolic CHF (congestive heart failure) (HCC)   Tachycardia   SVT (supraventricular tachycardia) (HCC)   Hypocalcemia   Transaminitis   Chronic systolic congestive heart failure (HCC)   Atrial flutter with rapid ventricular response (HCC)   Generalized anxiety disorder   Transposition of great vessel   Dyslipidemia   GERD (gastroesophageal reflux disease)   Leukocytosis   Hyperbilirubinemia   Abnormal EKG   Nonspecific chest pain  Critical Care Time spent: 31 mins  Irwin Brakeman, MD, FAAFP [facility] Pager [phone]  If 7PM-7AM, please contact night-coverage [email or web address] Password [facility] 10/02/2017, 10:34 AM    LOS: 3 days

## 2017-10-02 NOTE — Progress Notes (Signed)
ANTICOAGULATION CONSULT NOTE - follow up [place] for Coumadin and lovenox Indication: atrial fibrillation  Allergies  Allergen Reactions  . Aspirin Other (See Comments)    Unknown   Patient Measurements: Height: 6\' 2"  (188 cm) Weight: 227 lb 8.2 oz (103.2 kg) IBW/kg (Calculated) : 82.2  Vital Signs: Temp: 99.1 F (37.3 C) (03/21 0733) Temp Source: Axillary (03/21 0733) BP: 104/78 (03/21 0600) Pulse Rate: 79 (03/21 0733)  Labs: Recent Labs    09/29/17 1622 09/29/17 2157 09/30/17 0355 09/30/17 1232 10/01/17 0420 10/02/17 0533  HGB  --   --  14.4  --  13.4 12.9*  HCT  --   --  40.8  --  40.0 38.2*  PLT  --   --  266  --  221 212  LABPROT  --   --   --  13.0 14.1 20.0*  INR  --   --   --  0.99 1.10 1.72  CREATININE  --   --  0.94  --  0.73 0.73  TROPONINI <0.03 <0.03 <0.03  --   --   --    Estimated Creatinine Clearance: 158.9 mL/min (by C-G formula based on SCr of 0.73 mg/dL).  Medical History: Past Medical History:  Diagnosis Date  . Alcohol abuse   . Anxiety   . Atypical atrial flutter ([place])   . Complete transposition of great vessels   . Depression   . Dyslipidemia   . GERD (gastroesophageal reflux disease)   . Headache    "weekly" (09/24/2014)  . Heart murmur   . Hypertension   . 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 SVT (supraventricular tachycardia) ([place]) "several times in the last year" (09/24/2014)  . 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 escitalopram (LEXAPRO) 10 MG tablet Take 1 tablet (10 mg total) by mouth daily. 30 tablet 5 Past Week at Unknown time  . losartan (COZAAR) 50 MG tablet Take 1 tablet (50 mg total) by mouth daily. 90 tablet 3 09/29/2017 at Unknown time  . metoprolol tartrate (LOPRESSOR) 25 MG tablet Take 50 mg (2 tabs) by mouth in AM and 25 mg (1 tab) by mouth in PM. 270 tablet 3 09/29/2017 at 0900    Assessment: 42 y.o. male status post mustard repair for transposition of the greater vessels, history of atrial flutter, alcoholism, depression, systolic CHF and [facility] spot fever.  Admitted with ETOH intoxication Alcoholic with relapse. Also atrial flutter has not taken his meds or coumadin for a month . Cardiologists wants patient to restart coumadin and will bridge with lovenox until INR>2. Patient aware of risks with drinking and anticoagulation, plans to stop drinking alcohol. INR is below goal.   Goal of Therapy:  INR 2-3 Monitor platelets by anticoagulation protocol: Yes   Plan:  Coumadin 5mg  x 1 today Continue Lovenox 1.5mg /kg sq q24h until INR >2 PT-INR daily Monitor for S/S of bleeding  Hart Robinsons, PharmD Clinical Pharmacist Pager:  [phone] 10/02/2017   10/02/2017,10:45 AM

## 2017-10-02 NOTE — Progress Notes (Signed)
Since pt has been admitted to the ICU, Disposition CSW spoke with [place] Nurse to ask that consult be cancelled.  Consult can be resubmitted, if necessary, when pt is medically cleared.  Areatha Keas. Judi Cong, MSW, [place] Disposition Clinical Social Work [phone] (cell) [phone] (office)

## 2017-10-02 NOTE — Clinical Social Work Note (Signed)
Clinical Social Work Assessment  Patient Details  Name: Matthew [name] MRN: [id number] Date of Birth: September 27, 1975  Date of referral:  10/02/17               Reason for consult:  Substance Use/ETOH Abuse                Permission sought to share information with:    Permission granted to share information::     Name::        Agency::     Relationship::     Contact Information:     Housing/Transportation Living arrangements for the past 2 months:  Apartment Source of Information:  Patient Patient Interpreter Needed:  None Criminal Activity/Legal Involvement Pertinent to Current Situation/Hospitalization:  No - Comment as needed Significant Relationships:  Parents, Siblings, Other Family Members Lives with:  Self Do you feel safe going back to the place where you live?  Yes Need for family participation in patient care:  Yes (Comment)  Care giving concerns: Pt independent in ADLs. Main concern is pt's depression and alcohol misuse.   Social Worker assessment / plan: Pt is a 42 year old male referred to [facility] for ETOH treatment resources. Met with pt today to assess and assist. Pt is alert and oriented x4. He presents with very flat affect and he appears weak and tired. Pt states that he has been depressed and that his PCP started him on Lexapro about two weeks ago. Pt denies suicidal thoughts or plans. He states "I would never do that." Pt reports that he has good support from family members. Pt states that he works full time and he does not want to participate in inpt rehab at this time. Pt would like outpt treatment and also AA meetings. Provided pt with verbal and written information on treatment resources. Also provided emotional support to pt. Encouraged pt to also follow up with his insurance as they can direct him to contracted providers for AODA treatment and mental health treatment. Pt states he feels that he can do this. He is already aware of an AA meeting over the lunch hour  close to his work. Offered Pastoral Care to pt who states he would like that. Spoke with Lisette Abu to request.   Will follow up tomorrow to offer further support and dc planning.  Employment status:  [employer] information:  Other (Comment Required)(BCBS) PT Recommendations:  Not assessed at this time Information / Referral to community resources:  Outpatient Substance Abuse Treatment Options  Patient/Family's Response to care: Pt accepting of care.  Patient/Family's Understanding of and Emotional Response to Diagnosis, Current Treatment, and Prognosis: Pt appears to have a good understanding of diagnosis and treatment recommendations. Pt appears depressed and emotional support was offered.  Emotional Assessment Appearance:  Appears stated age Attitude/Demeanor/Rapport:  Lethargic Affect (typically observed):  Depressed Orientation:  Oriented to Self, Oriented to Place, Oriented to  Time, Oriented to Situation Alcohol / Substance use:  Alcohol Use Psych involvement (Current and /or in the community):  No (Comment)  Discharge Needs  Concerns to be addressed:  Substance Abuse Concerns, Mental Health Concerns Readmission within the last 30 days:  No Current discharge risk:  Substance Abuse Barriers to Discharge:  No Barriers Identified   Shade Flood, LCSW 10/02/2017, 11:57 AM

## 2017-10-02 NOTE — Progress Notes (Signed)
   Telemetry reviewed. Had brief episode of atrial tachycardia on 3/20 at 2250 lasting for less than 5 seconds. Has maintained NSR since with HR in the 70's to 80's. No new Cardiology recommendations at this time. Will need outpatient referral to [facility] for Cardiac MRI and Pediatric Echo in the setting of known Congenital Heart Disease as discussed in prior notes.   Signed, Erma Heritage, PA-C 10/02/2017, 9:56 AM

## 2017-10-02 NOTE — Plan of Care (Signed)
Patient noted to have excellent appetite, eating frequently throughout the day, denies c/o nausea and/or vomiting

## 2017-10-02 NOTE — Progress Notes (Signed)
Patient now telemetry and transferring to Dept 300 room# 311. Report called and given to receiving nurse Amy, RN.

## 2017-10-03 ENCOUNTER — Encounter (HOSPITAL_COMMUNITY): Payer: Self-pay | Admitting: Registered Nurse

## 2017-10-03 DIAGNOSIS — F10239 Alcohol dependence with withdrawal, unspecified: Secondary | ICD-10-CM

## 2017-10-03 DIAGNOSIS — I4892 Unspecified atrial flutter: Secondary | ICD-10-CM

## 2017-10-03 DIAGNOSIS — F331 Major depressive disorder, recurrent, moderate: Secondary | ICD-10-CM

## 2017-10-03 DIAGNOSIS — I5022 Chronic systolic (congestive) heart failure: Secondary | ICD-10-CM

## 2017-10-03 DIAGNOSIS — D72829 Elevated white blood cell count, unspecified: Secondary | ICD-10-CM

## 2017-10-03 DIAGNOSIS — I10 Essential (primary) hypertension: Secondary | ICD-10-CM

## 2017-10-03 DIAGNOSIS — K219 Gastro-esophageal reflux disease without esophagitis: Secondary | ICD-10-CM

## 2017-10-03 DIAGNOSIS — E871 Hypo-osmolality and hyponatremia: Principal | ICD-10-CM

## 2017-10-03 DIAGNOSIS — E876 Hypokalemia: Secondary | ICD-10-CM

## 2017-10-03 LAB — PROTIME-INR
INR: 1.94
Prothrombin Time: 22 seconds — ABNORMAL HIGH (ref 11.4–15.2)

## 2017-10-03 LAB — COMPREHENSIVE METABOLIC PANEL
ALT: 41 U/L (ref 17–63)
AST: 38 U/L (ref 15–41)
Albumin: 2.7 g/dL — ABNORMAL LOW (ref 3.5–5.0)
Alkaline Phosphatase: 63 U/L (ref 38–126)
Anion gap: 6 (ref 5–15)
BUN: 7 mg/dL (ref 6–20)
CO2: 27 mmol/L (ref 22–32)
Calcium: 8.2 mg/dL — ABNORMAL LOW (ref 8.9–10.3)
Chloride: 101 mmol/L (ref 101–111)
Creatinine, Ser: 0.74 mg/dL (ref 0.61–1.24)
GFR calc Af Amer: >60 mL/min (ref >60)
GFR calc non Af Amer: >60 mL/min (ref >60)
Glucose, Bld: 99 mg/dL (ref 65–99)
Potassium: 4.3 mmol/L (ref 3.5–5.1)
Sodium: 134 mmol/L — ABNORMAL LOW (ref 135–145)
Total Bilirubin: 0.6 mg/dL (ref 0.3–1.2)
Total Protein: 5.7 g/dL — ABNORMAL LOW (ref 6.5–8.1)

## 2017-10-03 LAB — MAGNESIUM: Magnesium: 1.6 mg/dL — ABNORMAL LOW (ref 1.7–2.4)

## 2017-10-03 MED ORDER — ACETAMINOPHEN 500 MG PO TABS: 1000.0000 mg | ORAL_TABLET | Freq: Three times a day (TID) | ORAL | 2 refills | Status: DC | PRN

## 2017-10-03 MED ORDER — WARFARIN SODIUM 5 MG PO TABS: 5.0000 mg | ORAL_TABLET | Freq: Once | ORAL | Status: DC

## 2017-10-03 MED ORDER — WARFARIN SODIUM 5 MG PO TABS: ORAL_TABLET | ORAL | 0 refills | Status: DC

## 2017-10-03 MED ORDER — MAGNESIUM SULFATE 4 GM/100ML IV SOLN
4.0000 g | Freq: Once | INTRAVENOUS | Status: DC
  Filled 2017-10-03: qty 100

## 2017-10-03 MED ORDER — METOPROLOL TARTRATE 25 MG PO TABS: ORAL_TABLET | ORAL | 3 refills | Status: DC

## 2017-10-03 MED ORDER — PANTOPRAZOLE SODIUM 40 MG PO TBEC: 40.0000 mg | DELAYED_RELEASE_TABLET | Freq: Every day | ORAL | 0 refills | Status: DC

## 2017-10-03 MED ORDER — HYDROXYZINE HCL 25 MG PO TABS: 25.0000 mg | ORAL_TABLET | Freq: Three times a day (TID) | ORAL | 0 refills | Status: DC | PRN

## 2017-10-03 MED ORDER — ONDANSETRON 4 MG PO TBDP: 4.0000 mg | ORAL_TABLET | Freq: Three times a day (TID) | ORAL | 0 refills | Status: DC | PRN

## 2017-10-03 MED ORDER — MAGNESIUM OXIDE 400 MG PO TABS: 400.0000 mg | ORAL_TABLET | Freq: Every day | ORAL | 0 refills | Status: DC

## 2017-10-03 NOTE — BH Assessment (Signed)
Patient was seen by TTS and Earleen Newport, NP  Earleen Newport, NP, psychiatrically cleared patient. Caryl Pina, RN, was notified of disposition.

## 2017-10-03 NOTE — Progress Notes (Signed)
ANTICOAGULATION CONSULT NOTE - follow up [place] for Coumadin and lovenox Indication: atrial fibrillation  Allergies  Allergen Reactions  . Aspirin Other (See Comments)    Unknown   Patient Measurements: Height: 6\' 2"  (188 cm) Weight: 226 lb 3.1 oz (102.6 kg) IBW/kg (Calculated) : 82.2  Vital Signs: Temp: 98.5 F (36.9 C) (03/22 0634) Temp Source: Oral (03/22 0634) BP: 131/68 (03/22 0634) Pulse Rate: 90 (03/22 0634)  Labs: Recent Labs    10/01/17 0420 10/02/17 0533 10/03/17 0622  HGB 13.4 12.9*  --   HCT 40.0 38.2*  --   PLT 221 212  --   LABPROT 14.1 20.0* 22.0*  INR 1.10 1.72 1.94  CREATININE 0.73 0.73 0.74   Estimated Creatinine Clearance: 158.5 mL/min (by C-G formula based on SCr of 0.74 mg/dL).  Medical History: Past Medical History:  Diagnosis Date  . Alcohol abuse   . Anxiety   . Atypical atrial flutter ([place])   . Complete transposition of great vessels   . Depression   . Dyslipidemia   . GERD (gastroesophageal reflux disease)   . Headache    "weekly" (09/24/2014)  . Heart murmur   . Hypertension   . 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 SVT (supraventricular tachycardia) ([place]) "several times in the last year" (09/24/2014)  . 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 escitalopram (LEXAPRO) 10 MG tablet Take 1 tablet (10 mg total) by mouth daily. 30 tablet 5 Past Week at Unknown time  . losartan (COZAAR) 50 MG tablet Take 1 tablet (50 mg total) by mouth daily. 90 tablet 3 09/29/2017 at Unknown time  . metoprolol tartrate (LOPRESSOR) 25 MG tablet Take 50 mg (2 tabs) by mouth in AM and 25 mg (1 tab) by mouth in PM. 270 tablet 3 09/29/2017 at 0900   Assessment: 42 y.o. male status post mustard repair for transposition of the greater vessels, history of atrial flutter, alcoholism, depression, systolic CHF and [facility]  spot fever.  Admitted with ETOH intoxication Alcoholic with relapse. Also atrial flutter has not taken his meds or coumadin for a month . Cardiologists wants patient to restart coumadin and will bridge with lovenox until INR>2. Patient aware of risks with drinking and anticoagulation, plans to stop drinking alcohol. INR is below goal.   Goal of Therapy:  INR 2-3 Monitor platelets by anticoagulation protocol: Yes   Plan:  Coumadin 5mg  x 1 today Continue Lovenox 1.5mg /kg sq q24h until INR >2 PT-INR daily Monitor for S/S of bleeding  Hart Robinsons, PharmD Clinical Pharmacist Pager:  [phone] 10/03/2017   10/03/2017,11:06 AM

## 2017-10-03 NOTE — Progress Notes (Signed)
Discharge instructions reviewed with patient and his mother per his request to have her present. Given AVS, f/u appointment already scheduled. Prescriptions sent to his pharmacy, pt and mother aware to pick them up. Verbalized understanding of instructions and follow-up. Walker delivered to his room for home use, home health PT arranged by CM. Pt left floor in stable condition via w/c accompanied by nurse tech. Donavan Foil, RN

## 2017-10-03 NOTE — Discharge Instructions (Signed)
Hyponatremia Hyponatremia is when the amount of salt (sodium) in your blood is too low. When salt levels are low, your cells absorb extra water and they swell. The swelling happens throughout the body, but it mostly affects the brain. Follow these instructions at home:  Take medicines only as told by your doctor. Many medicines can make this condition worse. Talk with your doctor about any medicines that you are currently taking.  Carefully follow a recommended diet as told by your doctor.  Carefully follow instructions from your doctor about fluid restrictions.  Keep all follow-up visits as told by your doctor. This is important.  Do not drink alcohol. Contact a doctor if:  You feel sicker to your stomach (nauseous).  You feel more confused.  You feel more tired (fatigued).  Your headache gets worse.  You feel weaker.  Your symptoms go away and then they come back.  You have trouble following the diet instructions. Get help right away if:  You start to twitch and shake (have a seizure).  You pass out (faint).  You keep having watery poop (diarrhea).  You keep throwing up (vomiting). This information is not intended to replace advice given to you by your health care provider. Make sure you discuss any questions you have with your health care provider. Document Released: 03/13/2011 Document Revised: 12/07/2015 Document Reviewed: 06/27/2014 Elsevier Interactive Patient Education  2018 [employer]  

## 2017-10-03 NOTE — Progress Notes (Signed)
TTS consult completed and patient cleared for psychiatric state. Text-paged Dr. Roderic Palau to notify. Donavan Foil, RN

## 2017-10-03 NOTE — Progress Notes (Signed)
Patient O2 sats 96% on r/a at rest. O2 sats maintained 93-96% on r/a while ambulating in hallway with nursing staff. Gait belt used for safety d/t patient c/o weakness and "my legs are sometimes wobbly". O2 sats 98% on r/a at rest after ambulating. Notified Dr.Memon. Pt to have walker and cane for home use, arranged by CM. Donavan Foil, RN

## 2017-10-03 NOTE — Discharge Summary (Signed)
Physician Discharge Summary  Matthew [name] YQM:[id number] DOB: 08/19/1975 DOA: 09/29/2017  PCP: Mikey Kirschner, MD  Admit date: 09/29/2017 Discharge date: 10/03/2017  Admitted From: Home Disposition: Home  Recommendations for Outpatient Follow-up:  1. Follow up with PCP in 1-2 weeks 2. Please obtain BMP/CBC in one week 3. Follow-up with cardiology for INR checks. 4. Patient plans to follow-up with Alcoholics Anonymous for support around his alcoholism.  He also plans to follow-up with a counselor/psychiatrist.  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  He had noted to have been binge drinking and was also admitted for alcohol detox management.  He was treated with intravenous Ativan for alcohol withdrawal.  Also seen by cardiology who recommended continuing on beta-blockers and anticoagulated with Coumadin.  Patient was started on Coumadin with Lovenox bridge.  Currently INR is 1.9.  He has no prior history of stroke.  He will be discharged home on Coumadin therapy.  He does not have any signs of severe alcohol withdrawal at this time.  Patient is coherent, he is not tremulous, vital signs are stable.  He plans to follow-up with Alcoholics Anonymous as well as a counselor for further treatment of his alcohol abuse.  He is otherwise stable for discharge.  Discharge Diagnoses:  Principal Problem:   MDD (major depressive disorder), recurrent episode, moderate (HCC) Active Problems:   HYPERTENSION, BENIGN   Complete transposition of great vessels   Nausea vomiting and diarrhea   Atrial flutter (HCC)   Hypokalemia    Hypomagnesemia   Alcohol abuse   Alcohol withdrawal (HCC)   Dehydration with hyponatremia   Depression   Elevated transaminase level   Protein-calorie malnutrition, severe (HCC)   Pulmonary hypertension (HCC)   Chronic systolic CHF (congestive heart failure) (HCC)   Tachycardia   SVT (supraventricular tachycardia) (HCC)   Hypocalcemia   Transaminitis   Chronic systolic congestive heart failure (HCC)   Atrial flutter with rapid ventricular response (HCC)   Generalized anxiety disorder   Hyponatremia   Transposition of great vessel   Dyslipidemia   GERD (gastroesophageal reflux disease)   Leukocytosis   Hyperbilirubinemia   Abnormal EKG   Nonspecific chest pain  1. Acute alcohol withdrawal.  Initially was having withdrawal hallucinations requiring intravenous Ativan.  Pain symptoms have resolved.  He still having some headache, nausea but this also may be related to his depression.  Does not appear to be tremulous at this time.  Seen by behavioral health and cleared for discharge.  Recommendations were for outpatient resources.  Patient does not wish to have inpatient rehab program at this time since he wants to keep his job.  He plans to follow-up with Alcoholics Anonymous as well as a Social worker.  Appears stable for discharge at this time. 2. Hypokalemia.  Replaced.  Magnesium low, will discharge on replacement 3. Tachycardia.  Likely exacerbated by dehydration.  Reordered on home beta-blockers and treated with IV fluids.  This is now resolved. 4. Chronic systolic CHF.  Appears to be compensated. 5. Chronic atrial flutter.  On beta-blockers.  Anticoagulated with Coumadin.  No prior history of stroke.  INR currently is 1.9.  Will continue on Coumadin on discharge.  He reports that cardiology has  been managing with this in the past.  I have asked him follow-up with his primary care physician. 6. Chest pain.  Troponins have been negative.  No further workup planned at this time. 7. GERD.   Continue on Protonix. 8. Depression.  Recently started on Lexapro by primary care physician.  Seen by behavioral health who recommended outpatient follow-up. 9. Leukocytosis.  Resolved.  Likely reactive from recent vomiting. 10. Hyponatremia.  Treated with IV fluids.  Likely related to hypovolemia.  Now resolved.   Discharge Instructions  Discharge Instructions    Diet - low sodium heart healthy   Complete by:  As directed    Face-to-face encounter (required for Medicare/Medicaid patients)   Complete by:  As directed    I Kathie Dike certify that this patient is under my care and that I, or a nurse practitioner or physician's assistant working with me, had a face-to-face encounter that meets the physician face-to-face encounter requirements with this patient on 10/03/2017. The encounter with the patient was in whole, or in part for the following medical condition(s) which is the primary reason for home health care (List medical condition): has oxygen desaturation overnight, needs overnight pulse oximetry to see if he qualifies for nocturnal oxygen   The encounter with the patient was in whole, or in part, for the following medical condition, which is the primary reason for home health care:  nocturnal hypoxia   I certify that, based on my findings, the following services are medically necessary home health services:  Nursing   Reason for Medically Necessary Home Health Services:  Other See Comments   My clinical findings support the need for the above services:  OTHER SEE COMMENTS   Further, I certify that my clinical findings support that this patient is homebound due to:  Unable to leave home safely without assistance   Home Health   Complete by:  As directed    To provide the following care/treatments:  Respiratory Care   Increase activity slowly   Complete by:  As directed      Allergies as of 10/03/2017      Reactions   Aspirin Other (See Comments)   Unknown      Medication List     STOP taking these medications   losartan 50 MG tablet Commonly known as:  COZAAR     TAKE these medications   acetaminophen 500 MG tablet Commonly known as:  TYLENOL Take 2 tablets (1,000 mg total) by mouth every 8 (eight) hours as needed for headache.   escitalopram 10 MG tablet Commonly known as:  LEXAPRO Take 1 tablet (10 mg total) by mouth daily.   hydrOXYzine 25 MG tablet Commonly known as:  ATARAX/VISTARIL Take 1 tablet (25 mg total) by mouth 3 (three) times daily as needed for anxiety.   magnesium oxide 400 MG tablet Commonly known as:  MAG-OX Take 1 tablet (400 mg total) by mouth daily.   metoprolol tartrate 25 MG tablet Commonly known as:  LOPRESSOR Take 50 mg (2 tabs) by mouth in AM and 25 mg (1 tab) by mouth in PM.   ondansetron 4 MG disintegrating tablet Commonly known as:  ZOFRAN ODT Take 1 tablet (4 mg total) by mouth every 8 (eight) hours as needed for nausea or vomiting.   pantoprazole 40 MG tablet Commonly known as:  PROTONIX Take 1 tablet (40 mg total) by mouth daily.   warfarin 5 MG tablet Commonly known as:  COUMADIN Take as directed. If you are  unsure how to take this medication, talk to your nurse or doctor. Original instructions:  Take one tablet po once a day on Tuesday and Fridays and half a tablet po daily the remainder of the week            Durable Medical Equipment  (From admission, onward)        Start     Ordered   10/03/17 1235  For home use only DME Cane  Once     10/03/17 1234   10/03/17 1234  For home use only DME Walker rolling  Once    Question:  Patient needs a walker to treat with the following condition  Answer:  Weakness   10/03/17 1234     Follow-up Information    Mikey Kirschner, MD. Schedule an appointment as soon as possible for a visit on 10/09/2017.   Specialty:  Family Medicine Why:  9:30 am Contact information: [place] [address] [phone]          Allergies   Allergen Reactions  . Aspirin Other (See Comments)    Unknown    Consultations:  Behavioral health  Cardiology   Procedures/Studies: Dg Chest Port 1 View  Result Date: 09/30/2017 CLINICAL DATA:  Chest pain. Nausea. Emesis. Leukocytosis. Hypertension. EXAM: PORTABLE CHEST 1 VIEW COMPARISON:  09/29/2017. FINDINGS: Prior median sternotomy. Cardiomegaly. Low lung volumes with mild bibasilar atelectasis. Stable left base pleuroparenchymal thickening consistent scarring. No pneumothorax. IMPRESSION: Prior median sternotomy.  Stable cardiomegaly. 2. Low lung volumes with mild bibasilar atelectasis. Stable left base pleuroparenchymal thickening consistent scarring. Electronically Signed   By: Marcello Moores  Register   On: 09/30/2017 07:25   Dg Chest Port 1 View  Result Date: 09/29/2017 CLINICAL DATA:  Chest pain and hypertension EXAM: PORTABLE CHEST 1 VIEW COMPARISON:  September 23, 2014 FINDINGS: There is no edema or consolidation. Heart is upper normal in size with pulmonary vascularity within normal limits. No adenopathy. Patient is status post median sternotomy. No bone lesions. IMPRESSION: No edema or consolidation.  Stable cardiac silhouette. Electronically Signed   By: Lowella Grip III M.D.   On: 09/29/2017 11:24       Subjective: Patient reports continued headache, has some nausea.  P.o. intake has been adequate.  Able to ambulate without difficulty.  He does not appear tremulous at this time.  Seen by behavioral health and has been cleared for discharge.  Discharge Exam: Vitals:   10/03/17 1435 10/03/17 1442  BP:    Pulse:    Resp:    Temp:    SpO2: 96% 98%   Vitals:   10/03/17 1217 10/03/17 1430 10/03/17 1435 10/03/17 1442  BP: 127/89 123/83    Pulse: 79 74    Resp: 18 16    Temp: 97.8 F (36.6 C) 98.5 F (36.9 C)    TempSrc: Oral Oral    SpO2: 97% 96% 96% 98%  Weight:      Height:        General: Pt is alert, awake, not in acute distress Cardiovascular: RRR, S1/S2  +, no rubs, no gallops Respiratory: CTA bilaterally, no wheezing, no rhonchi Abdominal: Soft, NT, ND, bowel sounds + Extremities: no edema, no cyanosis    The results of significant diagnostics from this hospitalization (including imaging, microbiology, ancillary and laboratory) are listed below for reference.     Microbiology: Recent Results (from the past 240 hour(s))  MRSA PCR Screening     Status: Abnormal   Collection  Time: 09/29/17  6:32 PM  Result Value Ref Range Status   MRSA by PCR POSITIVE (A) NEGATIVE Final    Comment:        The GeneXpert MRSA Assay (FDA approved for NASAL specimens only), is one component of a comprehensive MRSA colonization surveillance program. It is not intended to diagnose MRSA infection nor to guide or monitor treatment for MRSA infections. RESULT CALLED TO, READ BACK BY AND VERIFIED WITH: HOWARD,C AT 2107 ON 318.19 BY ISLEY,B Performed at [facility], [address], [place], [place] [address]      Labs: BNP (last 3 results) Recent Labs    09/29/17 1124  BNP 01.0   Basic Metabolic Panel: Recent Labs  Lab 09/29/17 1124 09/29/17 1242 09/30/17 0355 10/01/17 0420 10/02/17 0533 10/03/17 0622  NA 125*  --  134* 138 137 134*  K 2.5*  --  3.0* 3.6 4.2 4.3  CL 71*  --  89* 99* 101 101  CO2 30  --  32 29 28 27   GLUCOSE 143*  --  125* 91 102* 99  BUN 13  --  16 10 9 7   CREATININE 0.60*  --  0.94 0.73 0.73 0.74  CALCIUM 7.5*  --  7.7* 7.9* 8.0* 8.2*  MG  --  0.8* 1.8 1.6* 1.5* 1.6*   Liver Function Tests: Recent Labs  Lab 09/29/17 1124 09/30/17 0355 10/01/17 0420 10/02/17 0533 10/03/17 0622  AST 115* 90* 63* 54* 38  ALT 81* 58 48 49 41  ALKPHOS 89 71 58 59 63  BILITOT 2.1* 2.1* 1.1 0.7 0.6  PROT 6.7 5.5* 5.4* 5.6* 5.7*  ALBUMIN 3.3* 2.8* 2.7* 2.7* 2.7*   Recent Labs  Lab 09/29/17 1124  LIPASE 37   No results for input(s): AMMONIA in the last 168 hours. CBC: Recent Labs  Lab 09/29/17 1226 09/30/17 0355  10/01/17 0420 10/02/17 0533  WBC 13.9* 7.3 5.0 6.1  NEUTROABS  --  4.2 2.7 3.7  HGB 16.3 14.4 13.4 12.9*  HCT 45.4 40.8 40.0 38.2*  MCV 98.9 99.8 105.0* 104.7*  PLT 385 266 221 212   Cardiac Enzymes: Recent Labs  Lab 09/29/17 1124 09/29/17 1622 09/29/17 2157 09/30/17 0355  TROPONINI <0.03 <0.03 <0.03 <0.03   BNP: Invalid input(s): POCBNP CBG: No results for input(s): GLUCAP in the last 168 hours. D-Dimer No results for input(s): DDIMER in the last 72 hours. Hgb A1c No results for input(s): HGBA1C in the last 72 hours. Lipid Profile No results for input(s): CHOL, HDL, LDLCALC, TRIG, CHOLHDL, LDLDIRECT in the last 72 hours. Thyroid function studies No results for input(s): TSH, T4TOTAL, T3FREE, THYROIDAB in the last 72 hours.  Invalid input(s): FREET3 Anemia work up No results for input(s): VITAMINB12, FOLATE, FERRITIN, TIBC, IRON, RETICCTPCT in the last 72 hours. Urinalysis    Component Value Date/Time   COLORURINE AMBER (A) 07/31/2014 2043   APPEARANCEUR CLEAR 07/31/2014 2043   LABSPEC 1.015 07/31/2014 2043   PHURINE 7.0 07/31/2014 2043   GLUCOSEU NEGATIVE 07/31/2014 2043   HGBUR NEGATIVE 07/31/2014 2043   BILIRUBINUR SMALL (A) 07/31/2014 2043   [place] NEGATIVE 07/31/2014 2043   PROTEINUR 100 (A) 07/31/2014 2043   UROBILINOGEN 1.0 07/31/2014 2043   NITRITE NEGATIVE 07/31/2014 2043   LEUKOCYTESUR NEGATIVE 07/31/2014 2043   Sepsis Labs Invalid input(s): PROCALCITONIN,  WBC,  LACTICIDVEN Microbiology Recent Results (from the past 240 hour(s))  MRSA PCR Screening     Status: Abnormal   Collection Time: 09/29/17  6:32 PM  Result  Value Ref Range Status   MRSA by PCR POSITIVE (A) NEGATIVE Final    Comment:        The GeneXpert MRSA Assay (FDA approved for NASAL specimens only), is one component of a comprehensive MRSA colonization surveillance program. It is not intended to diagnose MRSA infection nor to guide or monitor treatment for MRSA  infections. RESULT CALLED TO, READ BACK BY AND VERIFIED WITH: HOWARD,C AT 2107 ON 318.19 BY ISLEY,B Performed at [facility], [address], [place], [place] [address]      Time coordinating discharge: Over 30 minutes  SIGNED:   Kathie Dike, MD  [facility] 10/03/2017, 7:20 PM Pager   If 7PM-7AM, please contact night-coverage [email or web address] Password [email or web address]

## 2017-10-03 NOTE — Progress Notes (Signed)
Late entry for this morning. Patient stated nursing may discuss his care with his mother present. Mother Cleon Gustin present with him today. Patient also stated okay for his [place] and brother Kaydyn Sayas to be listed as contacts for him. Contact numbers for mom, aunt and brother already on chart. Donavan Foil, RN

## 2017-10-03 NOTE — BH Assessment (Signed)
Assessment Note  Re-assessment  Matthew [name] is an 42 y.o. male. present to [facility] with acute alcohol withdrawal, worsen depression. Patient report he has been sober for 10-years and relapsed on alcohol triggered by worsening depression 2 or 3 weeks ago. Patient has been binging drinking and admitted to the hospital for alcohol detox management.   Shuvon Rankin, NP, assessed patient with TTS. Patient denies SI, HI and AVH. Patent report when discharged he wants to start an IOP program and attend AA meetings. Patient report he has spoken with a doctor to start psychiatric medication management and therapy services.     Diagnosis: F32.2  Major depressive disorder, Single episode, Severe  Past Medical History:  Past Medical History:  Diagnosis Date  . Alcohol abuse   . Anxiety   . Atypical atrial flutter ([place])   . Complete transposition of great vessels   . Depression   . Dyslipidemia   . GERD (gastroesophageal reflux disease)   . Headache    "weekly" (09/24/2014)  . Heart murmur   . Hypertension   . 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 SVT (supraventricular tachycardia) ([place]) "several times in the last year" (09/24/2014)  . 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 FINGER ARTHROPLASTY Right    volar plate arthroplasty, right small finger proxima linterphalangeal joint [Other]  . FRACTURE SURGERY    . SEPTOSTOMY     Rashkind balloon atrial septostomy   . TRANSPOSITION OF GREAT VESSELS REPAIR  1980   "mustard procedure"    Family History:  Family History  Problem Relation Age of Onset  . Rheumatic fever Father   . Hypertension Father     Social History:  reports that he has never smoked. His smokeless tobacco use includes snuff. He reports that he drinks about 36.0 oz of alcohol per week. He reports that he does not use drugs.  Additional Social History:  Alcohol / Drug Use Pain  Medications: see MAR Prescriptions: see MAR Over the Counter: see MAR History of alcohol / drug use?: Yes Substance #1 Name of Substance 1: Alcohol  1 - Age of First Use: 22 1 - Amount (size/oz): varies amounts  1 - Frequency: daily  1 - Duration: ongoing  1 - Last Use / Amount: 09/30/2017  CIWA: CIWA-Ar BP: 127/89 Pulse Rate: 79 Nausea and Vomiting: mild nausea with no vomiting Tactile Disturbances: none Tremor: no tremor Auditory Disturbances: not present Paroxysmal Sweats: barely perceptible sweating, palms moist Visual Disturbances: very mild sensitivity Anxiety: two Headache, Fullness in Head: moderately severe Agitation: somewhat more than normal activity Orientation and Clouding of Sensorium: oriented and can do serial additions CIWA-Ar Total: 10 COWS:    Allergies:  Allergies  Allergen Reactions  . Aspirin Other (See Comments)    Unknown    Home Medications:  Medications Prior to Admission  Medication Sig Dispense Refill  . escitalopram (LEXAPRO) 10 MG tablet Take 1 tablet (10 mg total) by mouth daily. 30 tablet 5  . losartan (COZAAR) 50 MG tablet Take 1 tablet (50 mg total) by mouth daily. 90 tablet 3  . metoprolol tartrate (LOPRESSOR) 25 MG tablet Take 50 mg (2 tabs) by mouth in AM and 25 mg (1 tab) by mouth in PM. 270 tablet 3    OB/GYN Status:  No LMP for male patient.  General Assessment Data Location of Assessment: [facility] TTS Assessment: In system Is  this a Tele or Face-to-Face Assessment?: Tele Assessment Is this an Initial Assessment or a Re-assessment for this encounter?: Re-Assessment Marital status: Separated Living Arrangements: Alone Can pt return to current living arrangement?: Yes Admission Status: Voluntary Is patient capable of signing voluntary admission?: Yes Referral Source: Self/Family/Friend Insurance type: [place] Living Arrangements: Alone Name of Psychiatrist: none report Name of Therapist:  none report  Education Status Is patient currently in school?: No Is the patient employed, unemployed or receiving disability?: Employed  Risk to self with the past 6 months Suicidal Ideation: No Has patient been a risk to self within the past 6 months prior to admission? : No Suicidal Intent: No Has patient had any suicidal intent within the past 6 months prior to admission? : No Is patient at risk for suicide?: No Suicidal Plan?: No Has patient had any suicidal plan within the past 6 months prior to admission? : No Access to Means: No What has been your use of drugs/alcohol within the last 12 months?: alcohol Previous Attempts/Gestures: No How many times?: 0 Other Self Harm Risks: none known Triggers for Past Attempts: None known Intentional Self Injurious Behavior: None Family Suicide History: No Recent stressful life event(s): Other (Comment)(dealing with worsening depression) Persecutory voices/beliefs?: No Depression: Yes Depression Symptoms: Despondent, Tearfulness, Fatigue, Feeling worthless/self pity Substance abuse history and/or treatment for substance abuse?: Yes Suicide prevention information given to non-admitted patients: Not applicable  Risk to Others within the past 6 months Homicidal Ideation: No Does patient have any lifetime risk of violence toward others beyond the six months prior to admission? : No Thoughts of Harm to Others: No Current Homicidal Intent: No Current Homicidal Plan: No Access to Homicidal Means: No Identified Victim: none known History of harm to others?: No Assessment of Violence: None Noted Violent Behavior Description: none report Does patient have access to weapons?: No Criminal Charges Pending?: No Does patient have a court date: No Is patient on probation?: No  Psychosis Hallucinations: None noted Delusions: None noted  Mental Status Report Appearance/Hygiene: In scrubs Eye Contact: Good Motor Activity: Freedom of  movement Speech: Logical/coherent Level of Consciousness: Alert Mood: Depressed, Sad, Guilty Affect: Depressed, Sad, Flat Anxiety Level: None Thought Processes: Coherent Judgement: Unimpaired Orientation: Person, Time, Place, Situation Obsessive Compulsive Thoughts/Behaviors: None  Cognitive Functioning Concentration: Good Memory: Recent Intact, Remote Intact Is patient IDD: No Is patient DD?: No Insight: Fair(depression ) Impulse Control: Poor Appetite: Fair Have you had any weight changes? : No Change Sleep: Decreased Total Hours of Sleep: (no sleep) Vegetative Symptoms: None  ADLScreening [facility] Assessment Services) Patient's cognitive ability adequate to safely complete daily activities?: Yes Patient able to express need for assistance with ADLs?: Yes Independently performs ADLs?: Yes (appropriate for developmental age)  Prior Inpatient Therapy Prior Inpatient Therapy: Yes Prior Therapy Dates: 2016 Prior Therapy Facilty/Provider(s): [place] Reason for Treatment: alcohol, mental health   Prior Outpatient Therapy Prior Outpatient Therapy: No Does patient have an ACCT team?: No Does patient have Intensive In-House Services?  : No Does patient have [facility] services? : No Does patient have P4CC services?: No  ADL Screening (condition at time of admission) Patient's cognitive ability adequate to safely complete daily activities?: Yes Is the patient deaf or have difficulty hearing?: No Does the patient have difficulty seeing, even when wearing glasses/contacts?: No Does the patient have difficulty concentrating, remembering, or making decisions?: No Patient able to express need for assistance with ADLs?: Yes Does the patient have difficulty  dressing or bathing?: No Independently performs ADLs?: Yes (appropriate for developmental age) Does the patient have difficulty walking or climbing stairs?: No Weakness of Legs: None Weakness of Arms/Hands: None  Home Assistive  Devices/Equipment Home Assistive Devices/Equipment: None  Therapy Consults (therapy consults require a physician order) PT Evaluation Needed: No OT Evalulation Needed: No SLP Evaluation Needed: No Abuse/Neglect Assessment (Assessment to be complete while patient is alone) Abuse/Neglect Assessment Can Be Completed: Yes Physical Abuse: Denies Verbal Abuse: Denies Sexual Abuse: Denies Exploitation of patient/patient's resources: Denies Self-Neglect: Denies Values / Beliefs Cultural Requests During Hospitalization: None Spiritual Requests During Hospitalization: None Consults Spiritual Care Consult Needed: No Social Work Consult Needed: No Regulatory affairs officer (For Healthcare) Does Patient Have a Medical Advance Directive?: No Would patient like information on creating a medical advance directive?: No - Patient declined Nutrition Screen- [facility] Adult/[facility] Patient's home diet: Cardiac Has the patient recently lost weight without trying?: No Has the patient been eating poorly because of a decreased appetite?: No Malnutrition Screening Tool Score: 0  Additional Information 1:1 In Past 12 Months?: No CIRT Risk: No Elopement Risk: No Does patient have medical clearance?: No     Disposition:  Disposition Initial Assessment Completed for this Encounter: Yes Disposition of Patient: Discharge(Shuvon Rankin, NP, pt psych-cleared) Patient refused recommended treatment: No Mode of transportation if patient is discharged?: Car Patient referred to: Outpatient clinic referral  On Site Evaluation by:   Reviewed with Physician:    Despina Hidden 10/03/2017 12:40 PM

## 2017-10-03 NOTE — Progress Notes (Signed)
loss of IV access again, unable to administer magnesium as ordered or scheduled ativan IV.  notified dr Roderic Palau, stated okay to leave IV out at this time until seen by MD and TTS for determining discharge plan. Stated okay for patient to receive PRN dose of ativan orally instead of scheduled IV dose. Donavan Foil, RN

## 2017-10-03 NOTE — Clinical Social Work Note (Signed)
Following for support. Met with pt again today to offer further emotional support. Pt's mother present at the time of LCSW visit and she remained in the room with pt's consent. Pt continues to present with very flat affect. He states he feels physically worse than yesterday and that his emotional state is about the same. Pt continues to deny suicidal thoughts or plans. Provided pt with contact information for [facility] here in [place] and again encouraged pt to follow up with making an appointment there or elsewhere for counseling services.   Per MD, he will order TTS evaluation for pt prior to dc to see if they have any other recommendations. Will follow up Monday if pt remains hospitalized at that time.

## 2017-10-03 NOTE — Consult Note (Signed)
  Tele Assessment  Matthew [name], 42 y.o., male patient presented to [facility] with complaints of alcohol withdrawal and depression.  Patient was admitted to the hospital for alcohol detox management.  Patient seen via telepsych by TTS  this provider; chart reviewed and consulted with Dr. Dwyane Dee on 10/03/17.  On evaluation Matthew [name] reports that he is feeling better; said he came to the hospital because of alcohol withdrawal.  Patient denies suicidal homicidal ideation self-harm/homicidal ideation, psychosis, and paranoia.  Patient reported that he done well while he was living in [place] and have resources to assist with alcohol use disorder.  States his plans after discharge from hospital is the final local community resources that can help him with his alcohol use disorder.  States he done well as long as he had resources  During evaluation Matthew [name] is sitting up in bed he is alert/oriented x 4; calm/cooperative with  appropriate affect.  He does not appear to be responding to internal/external stimuli or delusional thoughts.  Patient denies suicidal/self-harm/homicidal ideation, psychosis, and paranoia.  Patient answered question appropriately.  Patient psychiatrically cleared   Recommendations: Psychiatrically cleared; give information on community resources and outpatient services for alcohol use disorder  Disposition: No evidence of imminent risk to self or others at present.   Patient does not meet criteria for psychiatric inpatient admission.   See note of TTS telemetry assessment for full assessment note   Brendin Situ B. Nyna Chilton, NP

## 2017-10-03 NOTE — Care Management Note (Addendum)
Case Management Note  Patient Details  Name: Matthew [name] MRN: [id number] Date of Birth: 03/05/1976  Subjective/Objective:  Adm with hyponatremia, alcohol withdrawal. CSW consulted and working with patient on treatment options/choices. TTS consulted also. Seen by PT and recommended for OP PT, RW , possible upgrade to [facility]. Discussed with patient. He declines OP PT and RW. Reports he may be agreeable to cane. He works full time. Patient's mother arrives and asks for both RW and cane.                Action/Plan: Juliann Pulse of [facility] notified of order for RW and cane. Will deliver and discuss with patient. ADDENDUM: Patient will need overnight Oximetry and is agreeable. Ordered placed, Juliann Pulse of [facility] notified.   Expected Discharge Date:   10/04/2017               Expected Discharge Plan:  Home/Self Care  In-House Referral:     Discharge planning Services  CM Consult  Post Acute Care Choice:  Durable Medical Equipment Choice offered to:  Patient, Parent  DME Arranged:  [place]ford Rile rolling, Cane DME Agency:  [place]:    [place]:     Status of Service:  Completed, signed off  If discussed at [place] of Stay Meetings, dates discussed:    Additional Comments:  Rene Gonsoulin, Chauncey Reading, RN 10/03/2017, 12:36 PM

## 2017-10-07 ENCOUNTER — Encounter: Payer: Self-pay | Admitting: Family Medicine

## 2017-10-07 ENCOUNTER — Ambulatory Visit: Payer: BLUE CROSS/BLUE SHIELD | Admitting: Family Medicine

## 2017-10-07 VITALS — BP 122/72 | Ht 74.5 in | Wt 216.6 lb

## 2017-10-07 DIAGNOSIS — D72829 Elevated white blood cell count, unspecified: Secondary | ICD-10-CM

## 2017-10-07 DIAGNOSIS — E876 Hypokalemia: Secondary | ICD-10-CM

## 2017-10-07 DIAGNOSIS — F329 Major depressive disorder, single episode, unspecified: Secondary | ICD-10-CM

## 2017-10-07 DIAGNOSIS — F1010 Alcohol abuse, uncomplicated: Secondary | ICD-10-CM

## 2017-10-07 DIAGNOSIS — F411 Generalized anxiety disorder: Secondary | ICD-10-CM

## 2017-10-07 DIAGNOSIS — F32A Depression, unspecified: Secondary | ICD-10-CM

## 2017-10-07 NOTE — Patient Instructions (Addendum)
Multivitamin  generic one daily with folic acid and b 12

## 2017-10-07 NOTE — Progress Notes (Signed)
   Subjective:    Patient ID: Matthew [name], male    DOB: 1976-02-05, 42 y.o.   MRN: [id number]  HPI  Patient arrives for a follow up on recent hospitalization for dehydration- was not eating and drank some alcohol and was unable to function. At hospital patient was diagnosed with CHF-referred to specialist at [facility].  Patient office for evaluation from the hospital.  All hospital records reviewed in presence of patient.  Admission H&P and discharge summaries.  Consult notes.  Lab reports etc. Patient claims he has not gone back to alcohol since this hospital   States the Lexapro is seem to be helping his stress and anxiety  No suicidal or homicidal thoughts.  Has been taking the magnesium supplement just ran out today.  Attending [place] meetings faithfully Review of Systems No headache, no major weight loss or weight gain, no chest pain no back pain abdominal pain no change in bowel habits complete ROS otherwise negative     Objective:   Physical Exam  Alert and oriented, vitals reviewed and stable, NAD ENT-TM's and ext canals WNL bilat via otoscopic exam Soft palate, tonsils and post pharynx WNL via oropharyngeal exam Neck-symmetric, no masses; thyroid nonpalpable and nontender Pulmonary-no tachypnea or accessory muscle use; Clear without wheezes via auscultation Card--no abnrml murmurs, rhythm reg and rate WNL Carotid pulses symmetric, without bruits       Assessment & Plan:  Impression #1 status post hospitalization for tachyarrhythmias atrial flutter and cardiac symptoms with known chronic heart disease  2.  Status post hospitalization for acute inebriation in withdrawal symptoms.  Discussed at length.  Patient now on a day.  3.  Low magnesium and low potassium will reassess  Diet exercise discussed and encouraged.  Compliance with meds encouraged  Greater than 50% of this 25 minute face to face visit was spent in counseling and discussion and coordination of care  regarding the above diagnosis/diagnosies  Appropriate blood work next week follow-up as scheduled

## 2017-10-09 ENCOUNTER — Ambulatory Visit: Payer: BLUE CROSS/BLUE SHIELD | Admitting: Family Medicine

## 2017-10-15 LAB — CBC WITH DIFFERENTIAL/PLATELET
Basophils Absolute: 0 10*3/uL (ref 0.0–0.2)
Basos: 0 %
EOS (ABSOLUTE): 0.1 10*3/uL (ref 0.0–0.4)
Eos: 1 %
Hematocrit: 43 % (ref 37.5–51.0)
Hemoglobin: 14.6 g/dL (ref 13.0–17.7)
Immature Grans (Abs): 0.1 10*3/uL (ref 0.0–0.1)
Immature Granulocytes: 1 %
Lymphocytes Absolute: 2.2 10*3/uL (ref 0.7–3.1)
Lymphs: 24 %
MCH: 34.4 pg — ABNORMAL HIGH (ref 26.6–33.0)
MCHC: 34 g/dL (ref 31.5–35.7)
MCV: 101 fL — ABNORMAL HIGH (ref 79–97)
Monocytes Absolute: 0.7 10*3/uL (ref 0.1–0.9)
Monocytes: 8 %
Neutrophils Absolute: 6 10*3/uL (ref 1.4–7.0)
Neutrophils: 66 %
Platelets: 574 10*3/uL — ABNORMAL HIGH (ref 150–379)
RBC: 4.25 x10E6/uL (ref 4.14–5.80)
RDW: 15 % (ref 12.3–15.4)
WBC: 9.1 10*3/uL (ref 3.4–10.8)

## 2017-10-15 LAB — MAGNESIUM: Magnesium: 1.8 mg/dL (ref 1.6–2.3)

## 2017-10-15 LAB — HEPATIC FUNCTION PANEL
ALT: 43 IU/L (ref 0–44)
AST: 33 IU/L (ref 0–40)
Albumin: 4.5 g/dL (ref 3.5–5.5)
Alkaline Phosphatase: 64 IU/L (ref 39–117)
Bilirubin Total: 0.3 mg/dL (ref 0.0–1.2)
Bilirubin, Direct: 0.14 mg/dL (ref 0.00–0.40)
Total Protein: 7.7 g/dL (ref 6.0–8.5)

## 2017-10-15 LAB — BASIC METABOLIC PANEL
BUN/Creatinine Ratio: 12 (ref 9–20)
BUN: 9 mg/dL (ref 6–20)
CO2: 22 mmol/L (ref 20–29)
Calcium: 10 mg/dL (ref 8.7–10.2)
Chloride: 100 mmol/L (ref 96–106)
Creatinine, Ser: 0.77 mg/dL (ref 0.76–1.27)
GFR calc Af Amer: 132 mL/min/{1.73_m2} (ref >59)
GFR calc non Af Amer: 114 mL/min/{1.73_m2} (ref >59)
Glucose: 105 mg/dL — ABNORMAL HIGH (ref 65–99)
Potassium: 5.1 mmol/L (ref 3.5–5.2)
Sodium: 140 mmol/L (ref 134–144)

## 2017-10-16 ENCOUNTER — Telehealth: Payer: Self-pay | Admitting: Family Medicine

## 2017-10-16 NOTE — Telephone Encounter (Signed)
Pt notified that lab results have not been reviewed by provider but as soon as they are we will give pt a call

## 2017-10-16 NOTE — Telephone Encounter (Signed)
Patient is requesting results to his most recent blood work.

## 2017-10-17 ENCOUNTER — Ambulatory Visit (INDEPENDENT_AMBULATORY_CARE_PROVIDER_SITE_OTHER): Payer: BLUE CROSS/BLUE SHIELD | Admitting: *Deleted

## 2017-10-17 DIAGNOSIS — Z5181 Encounter for therapeutic drug level monitoring: Secondary | ICD-10-CM | POA: Diagnosis not present

## 2017-10-17 DIAGNOSIS — I4892 Unspecified atrial flutter: Secondary | ICD-10-CM

## 2017-10-17 DIAGNOSIS — I484 Atypical atrial flutter: Secondary | ICD-10-CM

## 2017-10-17 LAB — POCT INR: INR: 1.2

## 2017-10-17 NOTE — Patient Instructions (Addendum)
A full discussion of the nature of anticoagulants has been carried out.  A benefit risk analysis has been presented to the patient, so that they understand the justification for choosing anticoagulation at this time. The need for frequent and regular monitoring, precise dosage adjustment and compliance is stressed.  Side effects of potential bleeding are discussed.  The patient should avoid any OTC items containing aspirin or ibuprofen, and should avoid great swings in general diet.  Avoid alcohol consumption.  Call if any signs of abnormal bleeding.  Description   Today 1.5 tablets and tomorrow take 1 tablet then start taking 1 tablet daily except 1/2 tablet on Monday, Wednesday, and Saturday.  Recheck INR in 1 week. Call with any questions or new medications  [phone]

## 2017-10-21 NOTE — Telephone Encounter (Signed)
Pt called to check on lab results due to he's not received a call yet  Explained that once the doctor reviews results -  He will forward to the nurse to call  Please call pt [phone] with results

## 2017-10-22 NOTE — Telephone Encounter (Signed)
Patient called requesting results to lab work.  He is concerned that he hasn't heard anything back yet.

## 2017-10-23 NOTE — Telephone Encounter (Signed)
See lab note.  

## 2017-10-23 NOTE — Telephone Encounter (Signed)
Spoke with patient. Verbalized he has been sober for 23 days and is attending meeting and taking a multivitamin with folic acid

## 2017-10-24 ENCOUNTER — Encounter (INDEPENDENT_AMBULATORY_CARE_PROVIDER_SITE_OTHER): Payer: Self-pay

## 2017-10-24 ENCOUNTER — Ambulatory Visit (INDEPENDENT_AMBULATORY_CARE_PROVIDER_SITE_OTHER): Payer: BLUE CROSS/BLUE SHIELD | Admitting: *Deleted

## 2017-10-24 DIAGNOSIS — I4892 Unspecified atrial flutter: Secondary | ICD-10-CM | POA: Diagnosis not present

## 2017-10-24 DIAGNOSIS — Z5181 Encounter for therapeutic drug level monitoring: Secondary | ICD-10-CM | POA: Diagnosis not present

## 2017-10-24 LAB — POCT INR: INR: 2

## 2017-10-24 NOTE — Patient Instructions (Signed)
Description   Start taking 1 tablet daily except 1/2 tablet on Monday and Wednesday.  Recheck INR in 1 week. Call with any questions or new medications  [phone]

## 2017-10-28 ENCOUNTER — Telehealth: Payer: Self-pay | Admitting: Physician Assistant

## 2017-10-28 MED ORDER — WARFARIN SODIUM 5 MG PO TABS: ORAL_TABLET | ORAL | 0 refills | Status: DC

## 2017-10-28 NOTE — Telephone Encounter (Signed)
RX refill sent 

## 2017-10-28 NOTE — Telephone Encounter (Signed)
New message  Patient requesting refill  If Home Health RN is calling please get Coumadin Nurse on the phone STAT  1.  Are you calling in regards to an appointment? NO  2.  Are you calling for a refill ? YES  3.  Are you having bleeding issues? NO  4.  Do you need clearance to hold Coumadin? NO   Please route to the Coumadin Clinic Pool

## 2017-10-29 ENCOUNTER — Encounter: Payer: Self-pay | Admitting: Physician Assistant

## 2017-10-29 ENCOUNTER — Ambulatory Visit: Payer: BLUE CROSS/BLUE SHIELD | Admitting: Physician Assistant

## 2017-10-29 VITALS — BP 126/88 | HR 79 | Ht 73.0 in | Wt 225.0 lb

## 2017-10-29 DIAGNOSIS — I10 Essential (primary) hypertension: Secondary | ICD-10-CM

## 2017-10-29 DIAGNOSIS — R0609 Other forms of dyspnea: Secondary | ICD-10-CM

## 2017-10-29 DIAGNOSIS — I4892 Unspecified atrial flutter: Secondary | ICD-10-CM | POA: Diagnosis not present

## 2017-10-29 DIAGNOSIS — R0600 Dyspnea, unspecified: Secondary | ICD-10-CM

## 2017-10-29 DIAGNOSIS — Q203 Discordant ventriculoarterial connection: Secondary | ICD-10-CM | POA: Diagnosis not present

## 2017-10-29 MED ORDER — LOSARTAN POTASSIUM 50 MG PO TABS: 50.0000 mg | ORAL_TABLET | Freq: Every day | ORAL | 3 refills | Status: DC

## 2017-10-29 NOTE — Patient Instructions (Signed)
Medication Instructions: Your physician has recommended you make the following change in your medication:  -1) RESUME LOSARTAN (COZAAR) 50 MG - TAKE 1 TABLET (50 MG) BY MOUTH DAILY  Labwork: None Ordered  Procedures/Testing: None Ordered  Follow-Up: Your physician recommends that you keep your follow-up appointment AS SCHEDULED WITH DR. ROSS    If you need a refill on your cardiac medications before your next appointment, please call your pharmacy.

## 2017-10-29 NOTE — Progress Notes (Signed)
Cardiology Office Note    Date:  10/29/2017   ID:  Matthew [name], DOB 08-21-1975, MRN [id number]  PCP:  Mikey Kirschner, MD  Cardiologist:  Dr. Harrington Challenger EP: Dr. Lovena Le Chief Complaint: Hospital follow up   History of Present Illness:   Matthew [name] is a 42 y.o. 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   He has been evaluated by EP (Dr. Lovena Le) in 2015 who had arranged for him to undergo radiofrequency ablation at [facility]. Due to running out of insurance, he was initially unable to proceed with this plan.   Echo 08/21/15 Impressions:  - Poor quality images. Patient should be done by pediatric echo   tech in future. Apparantly s/p Mustard procedure with atrial   baffle and d transposition   The systemic ventrical ( RV) is trabeculated and diffusely   hypokinetic with EF in the 35% range. The is mild systemic AV   valve regurgitation. and moderate non sysemic AV valve   regurgitation. The atrial baffles are not well seen and I cannot   comment on any obstruction. Consider referral to [facility] for cardiac   MRI.  No cardiac symptoms when last seen by APP 12/31/16.  Admitted to [facility] 09/2017 for alcohol withdrawal (relaps after many years).  Patient does not wish to have inpatient rehab program and  he wants to keep his job.  He plans to follow-up with Alcoholics Anonymous as well as a Social worker. He was not taking coumadin or BB >> resumed. Per Dr. Johnsie Cancel >> Needs referral to [facility] as outpatient for cardiac MRI and pediatric echo . Known systemic ventricular dysfunction. Continue Beta blocker and ARB.   Here today for follow up. He is going to daily meeting for his rehab. No relapse. He complains of brief dyspnea with extreme lifting at work or going up on stairs. No chest heaviness or tightness. He denies any dyspnea or chest pain with  ambulation. Complainant with low sodium diet. Had had nausea, vomiting and diarrhea for past 24 hours after drinking milk. Improving. Denies palpitations, le edema, orthopnea, PND, syncope, dizziness or melena. Some reason he was taken off Losartan at discharge. Scr has been normal since admission.   Past Medical History:  Diagnosis Date  . Alcohol abuse   . Anxiety   . Atypical atrial flutter ([place])   . Complete transposition of great vessels   . Depression   . Dyslipidemia   . GERD (gastroesophageal reflux disease)   . Headache    "weekly" (09/24/2014)  . Heart murmur   . Hypertension   . 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 SVT (supraventricular tachycardia) ([place]) "several times in the last year" (09/24/2014)  . 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 FINGER ARTHROPLASTY Right    volar plate arthroplasty, right small finger proxima linterphalangeal joint [Other]  . FRACTURE SURGERY    . SEPTOSTOMY     Rashkind balloon atrial septostomy   . TRANSPOSITION OF GREAT VESSELS REPAIR  1980   "mustard procedure"    Current Medications: Prior to Admission medications   Medication Sig Start Date End Date Taking? Authorizing Provider  acetaminophen (TYLENOL) 500 MG tablet Take 2 tablets (1,000 mg total) by mouth every 8 (eight) hours as needed for headache. 10/03/17  10/03/18  Kathie Dike, MD  escitalopram (LEXAPRO) 10 MG tablet Take 1 tablet (10 mg total) by mouth daily. 09/18/17   Mikey Kirschner, MD  hydrOXYzine (ATARAX/VISTARIL) 25 MG tablet Take 1 tablet (25 mg total) by mouth 3 (three) times daily as needed for anxiety. 10/03/17   Kathie Dike, MD  magnesium oxide (MAG-OX) 400 MG tablet Take 1 tablet (400 mg total) by mouth daily. 10/03/17   Kathie Dike, MD  metoprolol tartrate (LOPRESSOR) 25 MG tablet Take 50 mg (2 tabs) by mouth in AM and 25 mg (1 tab) by mouth in PM. 10/03/17   Kathie Dike,  MD  ondansetron (ZOFRAN ODT) 4 MG disintegrating tablet Take 1 tablet (4 mg total) by mouth every 8 (eight) hours as needed for nausea or vomiting. 10/03/17   Kathie Dike, MD  pantoprazole (PROTONIX) 40 MG tablet Take 1 tablet (40 mg total) by mouth daily. 10/03/17   Kathie Dike, MD  warfarin (COUMADIN) 5 MG tablet Take 1/2 tablet to 1 tablet daily as directed by Coumadin clinic 10/28/17   Fay Records, MD    Allergies:   Aspirin   Social History   Socioeconomic History  . Marital status: Legally Separated    Spouse name: Not on file  . Number of children: Not on file  . Years of education: Not on file  . Highest education level: Not on file  Occupational History  . Not on file  Social Needs  . Financial resource strain: Not on file  . Food insecurity:    Worry: Not on file    Inability: Not on file  . Transportation needs:    Medical: Not on file    Non-medical: Not on file  Tobacco Use  . Smoking status: Never Smoker  . Smokeless tobacco: Current User    Types: Snuff  . Tobacco comment: "dipped when I played softball; none since 2011"  Substance and Sexual Activity  . Alcohol use: Yes    Alcohol/week: 36.0 oz    Types: 60 Shots of liquor per week    Comment: 09/24/2014 "1/5th vodka in 2 days; easy"  . Drug use: No  . Sexual activity: Yes  Lifestyle  . Physical activity:    Days per week: Not on file    Minutes per session: Not on file  . Stress: Not on file  Relationships  . Social connections:    Talks on phone: Not on file    Gets together: Not on file    Attends religious service: Not on file    Active member of club or organization: Not on file    Attends meetings of clubs or organizations: Not on file    Relationship status: Not on file  Other Topics Concern  . Not on file  Social History Narrative  . Not on file     Family History:  The patient's family history includes Hypertension in his father; Rheumatic fever in his father.   ROS:   Please  see the history of present illness.    ROS All other systems reviewed and are negative.   PHYSICAL EXAM:   VS:  BP 126/88   Pulse 79   Ht 6\' 1"  (1.854 m)   Wt 225 lb (102.1 kg)   SpO2 98%   BMI 29.69 kg/m    GEN: Well nourished, well developed, in no acute distress  HEENT: normal  Neck: no JVD, carotid bruits, or masses Cardiac: RRR; no murmurs, rubs, or gallops,no edema  Respiratory:  clear to auscultation bilaterally, normal work of breathing GI: soft, nontender, nondistended, + BS MS: no deformity or atrophy  Skin: warm and dry, no rash Neuro:  Alert and Oriented x 3, Strength and sensation are intact Psych: euthymic mood, full affect  Wt Readings from Last 3 Encounters:  10/29/17 225 lb (102.1 kg)  10/07/17 216 lb 9.6 oz (98.2 kg)  10/03/17 226 lb 3.1 oz (102.6 kg)      Studies/Labs Reviewed:   EKG:  EKG is not ordered today.    Recent Labs: 09/29/2017: B Natriuretic Peptide 50.0 10/14/2017: ALT 43; BUN 9; Creatinine, Ser 0.77; Hemoglobin 14.6; Magnesium 1.8; Platelets 574; Potassium 5.1; Sodium 140   Lipid Panel    Component Value Date/Time   CHOL 160 10/13/2015 0948   TRIG 49 10/13/2015 0948   HDL 39 (L) 10/13/2015 0948   CHOLHDL 4.1 10/13/2015 0948   VLDL 10 10/13/2015 0948   LDLCALC 111 10/13/2015 0948   LDLDIRECT 162.4 08/10/2012 1143    Additional studies/ records that were reviewed today include:   As above   ASSESSMENT & PLAN:    1. SVT/ Atrial flutter - No recent episode. Continue BB and coumadin.  2. TGV s/p mustard procedure - Last echo 08/2016 technically difficult study. Recommended study by pediatric echo tech in future vs referral to [facility] for cardiac MRI. I have reviewed with Dr. Lovena Le who saw the patient previously. He recommended first available appointment with Dr. Harrington Challenger prior to referral.   3. Dyspnea - Brief episodes at extreme exertion. Not typical of angina or CHF. Denies orthopnea, PND or LE edema. Previous echo in 2015 as  above?? Difficult study with LVEF around 35%. Will defer evaluation until office visit with Dr. Harrington Challenger. He will let us know if worsening of symptoms.  - Continue BB and resume losartan.   4.  HTN - Stable and well controlled on current regimen  5. Alcohol abuse - no relase since last admission.     Medication Adjustments/Labs and Tests Ordered: Current medicines are reviewed at length with the patient today.  Concerns regarding medicines are outlined above.  Medication changes, Labs and Tests ordered today are listed in the Patient Instructions below. Patient Instructions  Medication Instructions: Your physician has recommended you make the following change in your medication:  -1) RESUME LOSARTAN (COZAAR) 50 MG - TAKE 1 TABLET (50 MG) BY MOUTH DAILY  Labwork: None Ordered  Procedures/Testing: None Ordered  Follow-Up: Your physician recommends that you keep your follow-up appointment AS SCHEDULED WITH DR. ROSS    If you need a refill on your cardiac medications before your next appointment, please call your pharmacy.      Jarrett Soho, [place]  10/29/2017 12:04 PM    [address], [place], [place]  [address] Phone: [phone]; Fax: [phone]

## 2017-10-31 ENCOUNTER — Ambulatory Visit (INDEPENDENT_AMBULATORY_CARE_PROVIDER_SITE_OTHER): Payer: BLUE CROSS/BLUE SHIELD | Admitting: Pharmacist

## 2017-10-31 DIAGNOSIS — Z5181 Encounter for therapeutic drug level monitoring: Secondary | ICD-10-CM

## 2017-10-31 DIAGNOSIS — I4892 Unspecified atrial flutter: Secondary | ICD-10-CM

## 2017-10-31 LAB — POCT INR: INR: 4

## 2017-10-31 NOTE — Patient Instructions (Signed)
Description   Skip your Coumadin today and take 1/2 tablet tomorrow, then continue taking 1 tablet daily except 1/2 tablet on Monday and Wednesday.  Recheck INR in 2 weeks - same day as visit with Dr Harrington Challenger. Call with any questions or new medications  [phone]

## 2017-11-04 ENCOUNTER — Telehealth: Payer: Self-pay

## 2017-11-04 MED ORDER — HYDROXYZINE HCL 25 MG PO TABS: 25.0000 mg | ORAL_TABLET | Freq: Three times a day (TID) | ORAL | 1 refills | Status: DC | PRN

## 2017-11-04 NOTE — Telephone Encounter (Signed)
May may give 25 mg numb 30 one tid prn with one ref

## 2017-11-04 NOTE — Telephone Encounter (Signed)
Last seen 10/07/2017 for Leukocytosis.Rx request for Atarax 25 mg one TID prn anxiety. Was last filled by Ed Dr. Dr. Roderic Palau . [place] [facility].Please advise.

## 2017-11-06 ENCOUNTER — Ambulatory Visit (HOSPITAL_COMMUNITY): Payer: Self-pay | Admitting: Licensed Clinical Social Worker

## 2017-11-10 ENCOUNTER — Ambulatory Visit (INDEPENDENT_AMBULATORY_CARE_PROVIDER_SITE_OTHER): Payer: BLUE CROSS/BLUE SHIELD | Admitting: Family Medicine

## 2017-11-10 ENCOUNTER — Encounter: Payer: Self-pay | Admitting: Family Medicine

## 2017-11-10 VITALS — BP 130/94 | Temp 99.8°F | Ht 73.0 in | Wt 237.6 lb

## 2017-11-10 DIAGNOSIS — J111 Influenza due to unidentified influenza virus with other respiratory manifestations: Secondary | ICD-10-CM | POA: Diagnosis not present

## 2017-11-10 MED ORDER — OSELTAMIVIR PHOSPHATE 75 MG PO CAPS: 75.0000 mg | ORAL_CAPSULE | Freq: Two times a day (BID) | ORAL | 0 refills | Status: AC

## 2017-11-10 NOTE — Progress Notes (Signed)
   Subjective:    Patient ID: Matthew [name], male    DOB: [birth year], 42 y.o.   MRN: [id number]  Cough  This is a new problem. The current episode started in the past 7 days. Cough characteristics: a little brown and green. Associated symptoms include chills, a fever, headaches, a sore throat and sweats. Associated symptoms comments: SOB on exertion, low O2. Treatments tried: Tylenol, Mucinex.   Came on sudden   Had cold chills  Hit fairly suddenly  Bad cough   No energy   Appetite not good  Bad aching   Frontal headache   Bad pain   Took tylenol    Usually O2 sat decent, was low with sickness  Pos fever clearly did not k    Review of Systems  Constitutional: Positive for chills and fever.  HENT: Positive for sore throat.   Respiratory: Positive for cough.   Neurological: Positive for headaches.       Objective:   Physical Exam  Alert vitals reviewed, moderate malaise. Hydration good. Positive nasal congestion lungs no crackles or wheezes, no tachypnea, intermittent bronchial cough during exam heart regular rate and rhythm.       Assessment & Plan:  Impression influenza discussed at length. Petra Kuba of illness and potential sequela discussed. Plan Tamiflu prescribed if indicated and timing appropriate. Symptom care discussed. Warning signs discussed. WSL Some concern expressed by patient regarding O2 sat.  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  Also element of reduced ejection fraction.  No inspiratory crackles.  Speaking in full sentences.  No respiratory distress.  I feel as Tamiflu improves his situation he is breathing will also improve, warning signs discussed

## 2017-11-11 ENCOUNTER — Other Ambulatory Visit: Payer: Self-pay

## 2017-11-11 ENCOUNTER — Telehealth: Payer: Self-pay | Admitting: *Deleted

## 2017-11-11 ENCOUNTER — Encounter (HOSPITAL_COMMUNITY): Payer: Self-pay | Admitting: Emergency Medicine

## 2017-11-11 ENCOUNTER — Emergency Department (HOSPITAL_COMMUNITY)
Admission: EM | Admit: 2017-11-11 | Discharge: 2017-11-11 | Disposition: A | Discharge status: Home / Self Care | Payer: BLUE CROSS/BLUE SHIELD | Attending: Emergency Medicine | Admitting: Emergency Medicine

## 2017-11-11 ENCOUNTER — Telehealth: Payer: Self-pay | Admitting: Internal Medicine

## 2017-11-11 ENCOUNTER — Emergency Department (HOSPITAL_COMMUNITY): Payer: BLUE CROSS/BLUE SHIELD

## 2017-11-11 DIAGNOSIS — J189 Pneumonia, unspecified organism: Secondary | ICD-10-CM | POA: Diagnosis not present

## 2017-11-11 DIAGNOSIS — R509 Fever, unspecified: Secondary | ICD-10-CM | POA: Diagnosis present

## 2017-11-11 DIAGNOSIS — I110 Hypertensive heart disease with heart failure: Secondary | ICD-10-CM | POA: Insufficient documentation

## 2017-11-11 DIAGNOSIS — I5022 Chronic systolic (congestive) heart failure: Secondary | ICD-10-CM | POA: Insufficient documentation

## 2017-11-11 DIAGNOSIS — Z7901 Long term (current) use of anticoagulants: Secondary | ICD-10-CM | POA: Insufficient documentation

## 2017-11-11 DIAGNOSIS — F17228 Nicotine dependence, chewing tobacco, with other nicotine-induced disorders: Secondary | ICD-10-CM | POA: Insufficient documentation

## 2017-11-11 DIAGNOSIS — Z79899 Other long term (current) drug therapy: Secondary | ICD-10-CM | POA: Diagnosis not present

## 2017-11-11 LAB — TROPONIN I: Troponin I: <0.03 ng/mL (ref <0.03)

## 2017-11-11 LAB — URINALYSIS, ROUTINE W REFLEX MICROSCOPIC
Bacteria, UA: NONE SEEN
Bilirubin Urine: NEGATIVE
Glucose, UA: NEGATIVE mg/dL
Ketones, ur: 20 mg/dL — AB
Leukocytes, UA: NEGATIVE
Nitrite: NEGATIVE
Protein, ur: NEGATIVE mg/dL
Specific Gravity, Urine: 1.026 (ref 1.005–1.030)
pH: 6 (ref 5.0–8.0)

## 2017-11-11 LAB — CBC WITH DIFFERENTIAL/PLATELET
Basophils Absolute: 0 10*3/uL (ref 0.0–0.1)
Basophils Relative: 0 %
Eosinophils Absolute: 0.1 10*3/uL (ref 0.0–0.7)
Eosinophils Relative: 1 %
HCT: 37.6 % — ABNORMAL LOW (ref 39.0–52.0)
Hemoglobin: 12.2 g/dL — ABNORMAL LOW (ref 13.0–17.0)
Lymphocytes Relative: 15 %
Lymphs Abs: 1.5 10*3/uL (ref 0.7–4.0)
MCH: 31.4 pg (ref 26.0–34.0)
MCHC: 32.4 g/dL (ref 30.0–36.0)
MCV: 96.7 fL (ref 78.0–100.0)
Monocytes Absolute: 1 10*3/uL (ref 0.1–1.0)
Monocytes Relative: 11 %
Neutro Abs: 7.2 10*3/uL (ref 1.7–7.7)
Neutrophils Relative %: 73 %
Platelets: 303 10*3/uL (ref 150–400)
RBC: 3.89 MIL/uL — ABNORMAL LOW (ref 4.22–5.81)
RDW: 13.9 % (ref 11.5–15.5)
WBC: 9.8 10*3/uL (ref 4.0–10.5)

## 2017-11-11 LAB — COMPREHENSIVE METABOLIC PANEL
ALT: 15 U/L — ABNORMAL LOW (ref 17–63)
AST: 16 U/L (ref 15–41)
Albumin: 3.7 g/dL (ref 3.5–5.0)
Alkaline Phosphatase: 49 U/L (ref 38–126)
Anion gap: 14 (ref 5–15)
BUN: 8 mg/dL (ref 6–20)
CO2: 22 mmol/L (ref 22–32)
Calcium: 9.1 mg/dL (ref 8.9–10.3)
Chloride: 104 mmol/L (ref 101–111)
Creatinine, Ser: 0.65 mg/dL (ref 0.61–1.24)
GFR calc Af Amer: >60 mL/min (ref >60)
GFR calc non Af Amer: >60 mL/min (ref >60)
Glucose, Bld: 89 mg/dL (ref 65–99)
Potassium: 3.1 mmol/L — ABNORMAL LOW (ref 3.5–5.1)
Sodium: 140 mmol/L (ref 135–145)
Total Bilirubin: 1 mg/dL (ref 0.3–1.2)
Total Protein: 7.4 g/dL (ref 6.5–8.1)

## 2017-11-11 LAB — BRAIN NATRIURETIC PEPTIDE: B Natriuretic Peptide: 294 pg/mL — ABNORMAL HIGH (ref 0.0–100.0)

## 2017-11-11 LAB — I-STAT CG4 LACTIC ACID, ED: Lactic Acid, Venous: 0.88 mmol/L (ref 0.5–1.9)

## 2017-11-11 MED ORDER — CEFTRIAXONE SODIUM 1 G IJ SOLR
1.0000 g | Freq: Once | INTRAMUSCULAR | Status: AC
  Administered 2017-11-11: 1 g via INTRAVENOUS
  Filled 2017-11-11: qty 10

## 2017-11-11 MED ORDER — AZITHROMYCIN 250 MG PO TABS
500.0000 mg | ORAL_TABLET | Freq: Once | ORAL | Status: AC
  Administered 2017-11-11: 500 mg via ORAL
  Filled 2017-11-11: qty 2

## 2017-11-11 MED ORDER — FUROSEMIDE 10 MG/ML IJ SOLN
40.0000 mg | INTRAMUSCULAR | Status: AC
  Administered 2017-11-11: 40 mg via INTRAVENOUS
  Filled 2017-11-11: qty 4

## 2017-11-11 MED ORDER — AZITHROMYCIN 250 MG PO TABS: 250.0000 mg | ORAL_TABLET | Freq: Every day | ORAL | 0 refills | Status: DC

## 2017-11-11 NOTE — Telephone Encounter (Signed)
Pt seen yesterday. Diagnosed with the flu. Headache for 2 days. Oxygen level has been in the 80's. Pt states he is not short of breath. Just got an oxygen monitor and the highest was 92. Right now it is 86. Consult with dr Richardson Landry. Headache is part of the flu and pt needs to call cardiologist to discuss oxygen levels. Could be part of his chronic condition. Pt verbalized understanding and states he will call his cardiologist office now.

## 2017-11-11 NOTE — Discharge Instructions (Signed)
Your testing today shows that you may have an early pneumonia.  We have given you some Lasix to help bring some of the fluid off of your body will be taking the antibiotics as prescribed including Zithromax daily for the next 5 days.  We have given you a dose of Rocephin which is a very strong antibiotic through your IV.  If you should develop increasing difficulty breathing, chest pain, high fevers, return to the emergency department immediately.

## 2017-11-11 NOTE — ED Provider Notes (Signed)
[facility] EMERGENCY DEPARTMENT Provider Note   CSN: [id number] Arrival date & time: 11/11/17  1509     History   Chief Complaint Chief Complaint  Patient presents with  . Fever    HPI Matthew [name] is a 42 y.o. male.  HPI  42 year old male, he has a known history of alcohol abuse for which he recently relapsed and was admitted to the hospital with possible CHF exacerbation.  He has a very specific history of transposition of the great vessels as a baby, at 30-month-old he underwent surgical repair with a mustard procedure, he has done very well overall and only several years ago did he develop a diagnosis of congestive heart failure.  Review of the medical record shows that the patient has an ejection fraction of around 35% with diffuse hypokinesis.  He is not known to have any ischemic disease.  The patient reports that several days ago he started having increasing coughing, subjective fevers, he has a home oxygen saturation monitor and has notices oxygen ranging from 86% to 96% and states that when he is up and exerting himself it goes up, when he is laying down and not breathing as vigorously it goes down.  He did have some peripheral edema on Saturday but it resolved by Sunday and he no longer has that.  His symptoms of been persistent, nothing seems to make this better or worse, no associated objective fevers.  He discussed his care with his family doctor who wanted him to come to the hospital.  Cardiologist is Dr. Dorris Carnes  Past Medical History:  Diagnosis Date  . Alcohol abuse   . Anxiety   . Atypical atrial flutter ([place])   . Complete transposition of great vessels   . Depression   . Dyslipidemia   . GERD (gastroesophageal reflux disease)   . Headache    "weekly" (09/24/2014)  . Heart murmur   . Hypertension   . 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 SVT (supraventricular tachycardia) ([place]) "several times in the last year" (09/24/2014)  . Transposition of great  vessel    s/p Mustard procedure in 1980    Patient Active Problem List   Diagnosis Date Noted  . MDD (major depressive disorder), recurrent episode, moderate ([place]) 10/03/2017  . Abnormal EKG   . Nonspecific chest pain   . Hyponatremia 09/29/2017  . Transposition of great vessel 09/29/2017  . Dyslipidemia 09/29/2017  . GERD (gastroesophageal reflux disease) 09/29/2017  . Leukocytosis 09/29/2017  . Hyperbilirubinemia 09/29/2017  . Generalized anxiety disorder 09/18/2017  . Atrial flutter with rapid ventricular response ([place]) 09/23/2014  . Chronic systolic congestive heart failure ([place])   . Tachycardia 07/28/2014  . SVT (supraventricular tachycardia) ([place])   . Hypocalcemia   . Transaminitis   . A-fib ([place]) 04/21/2014  . Encounter for therapeutic drug monitoring 01/07/2014  . Hallucination 01/04/2014  . Chronic systolic CHF (congestive heart failure) ([place]) 01/01/2014  . Pulmonary hypertension ([place]) 12/31/2013  . Metabolic acidosis 66/44/0347  . Protein-calorie malnutrition, severe ([place]) 12/30/2013  . Nausea vomiting and diarrhea 12/29/2013  . Atrial flutter ([place]) 12/29/2013  . Hypokalemia 12/29/2013  . Hypomagnesemia 12/29/2013  . Alcohol abuse 12/29/2013  . Alcohol withdrawal ([place]) 12/29/2013  . Dehydration with hyponatremia 12/29/2013  . Depression 12/29/2013  . Hyperglycemia 12/29/2013  . [place] 12/29/2013  . Elevated transaminase level 12/29/2013  . HYPERLIPIDEMIA-MIXED 04/21/2009  . HYPERTENSION, BENIGN 04/21/2009  . OTHER DISORDERS PAPILLARY MUSCLE 01/24/2009  . Complete transposition of great vessels 11/28/2008  Past Surgical History:  Procedure Laterality Date  . FINGER ARTHROPLASTY Right    volar plate arthroplasty, right small finger proxima linterphalangeal joint [Other]  . FRACTURE SURGERY    . SEPTOSTOMY     Rashkind balloon atrial septostomy   . TRANSPOSITION OF GREAT VESSELS REPAIR  1980   "mustard procedure"        Home Medications    Prior  to Admission medications   Medication Sig Start Date End Date Taking? Authorizing Provider  escitalopram (LEXAPRO) 10 MG tablet Take 1 tablet (10 mg total) by mouth daily. 09/18/17  Yes Mikey Kirschner, MD  hydrOXYzine (ATARAX/VISTARIL) 25 MG tablet Take 1 tablet (25 mg total) by mouth 3 (three) times daily as needed. 11/04/17  Yes Mikey Kirschner, MD  losartan (COZAAR) 50 MG tablet Take 1 tablet (50 mg total) by mouth daily. 10/29/17 01/27/18 Yes Bhagat, Bhavinkumar, PA  metoprolol tartrate (LOPRESSOR) 25 MG tablet Take 50 mg (2 tabs) by mouth in AM and 25 mg (1 tab) by mouth in PM. 10/03/17  Yes Memon, Jolaine Artist, MD  ondansetron (ZOFRAN ODT) 4 MG disintegrating tablet Take 1 tablet (4 mg total) by mouth every 8 (eight) hours as needed for nausea or vomiting. 10/03/17  Yes Kathie Dike, MD  oseltamivir (TAMIFLU) 75 MG capsule Take 1 capsule (75 mg total) by mouth 2 (two) times daily for 5 days. 11/10/17 11/15/17 Yes Mikey Kirschner, MD  SALINE NASAL MIST NA Place 1-2 sprays into the nose daily as needed (for congestion).   Yes [provider]  warfarin (COUMADIN) 5 MG tablet Take 1/2 tablet to 1 tablet daily as directed by Coumadin clinic Patient taking differently: Take 2.5-5 mg by mouth See admin instructions. Take 1/2 tablet on Mondays and Wednesdays. Take 1 tablet on all other days 10/28/17  Yes Fay Records, MD  azithromycin (ZITHROMAX Z-PAK) 250 MG tablet Take 1 tablet (250 mg total) by mouth daily. 500mg  PO day 1, then 250mg  PO days 205 11/11/17   Noemi Chapel, MD    Family History Family History  Problem Relation Age of Onset  . Rheumatic fever Father   . Hypertension Father     Social History Social History   Tobacco Use  . Smoking status: Never Smoker  . Smokeless tobacco: Current User    Types: Snuff  . Tobacco comment: "dipped when I played softball; none since 2011"  Substance Use Topics  . Alcohol use: Yes    Alcohol/week: 36.0 oz    Types: 60 Shots of liquor per  week    Comment: 09/24/2014 "1/5th vodka in 2 days; easy"  . Drug use: No     Allergies   Aspirin   Review of Systems Review of Systems  All other systems reviewed and are negative.    Physical Exam Updated Vital Signs BP 125/80   Pulse 67   Temp 98.2 F (36.8 C) (Oral)   Resp (!) 28   Ht 6' 2.5" (1.892 m)   Wt 107.5 kg (237 lb)   SpO2 90%   BMI 30.02 kg/m   Physical Exam  Constitutional: He appears well-developed and well-nourished. No distress.  HENT:  Head: Normocephalic and atraumatic.  Mouth/Throat: Oropharynx is clear and moist. No oropharyngeal exudate.  Eyes: Pupils are equal, round, and reactive to light. Conjunctivae and EOM are normal. Right eye exhibits no discharge. Left eye exhibits no discharge. No scleral icterus.  Neck: Normal range of motion. Neck supple. No JVD present. No thyromegaly present.  Cardiovascular: Normal rate, regular rhythm, normal heart sounds and intact distal pulses. Exam reveals no gallop and no friction rub.  No murmur heard. Pulmonary/Chest: Effort normal and breath sounds normal. No respiratory distress. He has no wheezes. He has no rales.  Abdominal: Soft. Bowel sounds are normal. He exhibits no distension and no mass. There is no tenderness.  Musculoskeletal: Normal range of motion. He exhibits no edema or tenderness.  Lymphadenopathy:    He has no cervical adenopathy.  Neurological: He is alert. Coordination normal.  Skin: Skin is warm and dry. No rash noted. No erythema.  Psychiatric: He has a normal mood and affect. His behavior is normal.  Nursing note and vitals reviewed.    ED Treatments / Results  Labs (all labs ordered are listed, but only abnormal results are displayed) Labs Reviewed  COMPREHENSIVE METABOLIC PANEL - Abnormal; Notable for the following components:      Result Value   Potassium 3.1 (*)    ALT 15 (*)    All other components within normal limits  CBC WITH DIFFERENTIAL/PLATELET - Abnormal; Notable  for the following components:   RBC 3.89 (*)    Hemoglobin 12.2 (*)    HCT 37.6 (*)    All other components within normal limits  BRAIN NATRIURETIC PEPTIDE - Abnormal; Notable for the following components:   B Natriuretic Peptide 294.0 (*)    All other components within normal limits  CULTURE, BLOOD (ROUTINE X 2)  CULTURE, BLOOD (ROUTINE X 2)  TROPONIN I  URINALYSIS, ROUTINE W REFLEX MICROSCOPIC  I-STAT CG4 LACTIC ACID, ED  I-STAT CG4 LACTIC ACID, ED    EKG EKG Interpretation  Date/Time:  Tuesday November 11 2017 17:50:28 EDT Ventricular Rate:  70 PR Interval:    QRS Duration: 101 QT Interval:  534 QTC Calculation: 577 R Axis:   75 Text Interpretation:  Sinus rhythm RVH with secondary repolarization abnrm Repol abnrm suggests ischemia, diffuse leads Prolonged QT interval Baseline wander in lead(s) II aVR aVF since last tracing no significant change Confirmed by Noemi Chapel [phone]) on 11/11/2017 6:09:26 PM   Radiology Dg Chest 2 View  Result Date: 11/11/2017 CLINICAL DATA:  Cough with fever and body aches for the past 4 days. EXAM: CHEST - 2 VIEW COMPARISON:  Chest x-ray dated September 30, 2017. FINDINGS: Stable cardiomegaly. New pulmonary vascular congestion and diffuse interstitial thickening. Patchy opacities in the right upper and lower lobes. Trace bilateral pleural effusions. No pneumothorax. No acute osseous abnormality. IMPRESSION: 1. New pulmonary vascular congestion and mild interstitial edema with trace bilateral pleural effusions. 2. More focal opacities in the right upper and lower lobes may reflect asymmetric alveolar edema versus superimposed infection. Electronically Signed   By: Titus Dubin M.D.   On: 11/11/2017 15:45    Procedures Procedures (including critical care time)  Medications Ordered in ED Medications  furosemide (LASIX) injection 40 mg (has no administration in time range)  cefTRIAXone (ROCEPHIN) 1 g in sodium chloride 0.9 % 100 mL IVPB (has no  administration in time range)  azithromycin (ZITHROMAX) tablet 500 mg (has no administration in time range)     Initial Impression / Assessment and Plan / ED Course  I have reviewed the triage vital signs and the nursing notes.  Pertinent labs & imaging results that were available during my care of the patient were reviewed by me and considered in my medical decision making (see chart for details).  Clinical Course as of Nov 12 1911  Tue Nov 11, 2017  1909 The patient was ambulating in the emergency department without any difficulty on room air and had oxygen saturations no lower than 91%.  He is willing to pursue treatment with antibiotics, he has been given a single dose of Lasix, the rest of his labs have been unremarkable including his lactic acid which was normal, his white blood cell count which was less than 10,000 and his BNP which was less than 300.  He is aware of the indications for return, he will be given the rest of his Zithromax as a course after getting Rocephin in the emergency department.   [BM]    Clinical Course User Index [BM] Noemi Chapel, MD    The patient's oxygen level is approximately 94 to 96% on room air.  His oropharynx is clear and moist, there is no exudate asymmetry hypertrophy or erythema.  No lymphadenopathy of the neck, soft nontender abdomen with no hepatospleno megaly and he has clear lung sounds without rales wheezing or increased work of breathing.  His heart sounds are normal, there is no audible murmur.  The patient does have an abnormal x-ray which shows some possible focal opacities in the right upper and lower lobes which may be asymmetric edema versus infection, there is also some signs of trace bilateral pleural effusions.  Labs pending, i-STAT, troponin, EKG, CHF vs PNA  Final Clinical Impressions(s) / ED Diagnoses   Final diagnoses:  Community acquired pneumonia of right lung, unspecified part of lung    ED Discharge Orders         Ordered    azithromycin (ZITHROMAX Z-PAK) 250 MG tablet  Daily     11/11/17 1911       Noemi Chapel, MD 11/11/17 [phone]

## 2017-11-11 NOTE — ED Notes (Signed)
Pt states he does not need to urinate at this time, aware of DO  

## 2017-11-11 NOTE — Telephone Encounter (Signed)
Pt calling d/t PCP notified Pt to call cardiac office to report low oxygen sats.  Pt diagnosed yesterday with influenza.    Call placed to Dr. Wolfgang Phoenix office.  Notified office that recent hospitalization at [facility] Pt worked up for oxygen needs with following results- --10/03/2017 14:44-Patient O2 sats 96% on r/a at rest. O2 sats maintained 93-96% on r/a while ambulating in hallway with nursing staff. Gait belt used for safety d/t patient c/o weakness and "my legs are sometimes wobbly". O2 sats 98% on r/a at rest after ambulating. Notified Dr.Memon. Pt to have walker and cane for home use, arranged by CM. Donavan Foil, RN  PCP will call Pt and advise Pt should go to ER.  PCP states low oxygen sats not related to cardiac issue.   No further action needed.

## 2017-11-11 NOTE — Telephone Encounter (Signed)
New message  Pt verbalzied that he is calling for RN  Pt said that his oxygen has been in the 80-85 in the last two days   Pt has the flu   Please call to confirm the oxygen levels    Pt said he is not short of breath

## 2017-11-11 NOTE — ED Triage Notes (Addendum)
Pt states he was diagnosed with influenza by Dr. Tiburcio Bash yesterday. Endorses fever, body aches, and cough. Pt checked oxygen at home and it read from 85-92%. Oxygen alternates between 89-94% in triage.

## 2017-11-11 NOTE — Telephone Encounter (Signed)
Sonia Baller from [facility] called to state pt did call their office about oxygen levels being in the 80's and she looked in chart and pt did not qualify for oxygen when he had testing done. O2 was 92 and 93. Jenny states oxygen levels was related to flu and not from issues with heart and he should go to ED. I called pt and advised him of this and he states he will have someone take him to ED now.

## 2017-11-14 ENCOUNTER — Ambulatory Visit (INDEPENDENT_AMBULATORY_CARE_PROVIDER_SITE_OTHER): Payer: Self-pay | Admitting: Pharmacist

## 2017-11-14 ENCOUNTER — Encounter: Payer: Self-pay | Admitting: Internal Medicine

## 2017-11-14 ENCOUNTER — Ambulatory Visit (INDEPENDENT_AMBULATORY_CARE_PROVIDER_SITE_OTHER): Payer: BLUE CROSS/BLUE SHIELD | Admitting: Internal Medicine

## 2017-11-14 VITALS — BP 116/80 | HR 61 | Ht 74.5 in | Wt 228.1 lb

## 2017-11-14 DIAGNOSIS — I4892 Unspecified atrial flutter: Secondary | ICD-10-CM

## 2017-11-14 DIAGNOSIS — I10 Essential (primary) hypertension: Secondary | ICD-10-CM | POA: Diagnosis not present

## 2017-11-14 DIAGNOSIS — F1010 Alcohol abuse, uncomplicated: Secondary | ICD-10-CM

## 2017-11-14 DIAGNOSIS — Q203 Discordant ventriculoarterial connection: Secondary | ICD-10-CM | POA: Diagnosis not present

## 2017-11-14 DIAGNOSIS — I484 Atypical atrial flutter: Secondary | ICD-10-CM | POA: Diagnosis not present

## 2017-11-14 DIAGNOSIS — Z5181 Encounter for therapeutic drug level monitoring: Secondary | ICD-10-CM

## 2017-11-14 LAB — POCT INR: INR: 3.1

## 2017-11-14 NOTE — Patient Instructions (Signed)
Description   Take 1/2 tablet today, then continue taking 1 tablet daily except 1/2 tablet on Monday and Wednesday.  Recheck INR in 3 weeks. Call with any questions or new medications  [phone]

## 2017-11-14 NOTE — Progress Notes (Addendum)
Cardiology Office Note    Date:  12/03/2017   ID:  Matthew [name], DOB 1976/07/05, MRN [id number]  PCP:  Matthew Kirschner, MD  Cardiologist:  Dr. Harrington Challenger EP: Dr. Lovena [name] Chief Complaint:  F/U of TGV  History of Present Illness:   Matthew [name] is a 42 y.o. 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 He has been evaluated by EP (Dr. Lovena [name]) in 2015 who had arranged for him to undergo radiofrequency ablation at [place] echo in Feb 2017  Difficult study   RVEF 35%   Mild systemic AV vlave regurg; mod nonsystemic AV valve regurg  Baffles not well seen  Admitted to [facility] 09/2017 for alcohol intoxication (relaps after many years).  Patient does not wish to have inpatient rehab program and  he wants to keep his job.  Coumadin was resumed   He was reported in [place] then self converted to SR  (he had been off meds for a month)   Seen by [facility] in April 2019  Doing good at that time  Some brief dyspnea withextreme lifting   No edema  No palpitations   Since seen he was seen in ED on 430 for fever   Sent home on Z Pack for community acquid pneumonia of R Lung  Since ED visit he denies fever    Feeling better   Breathing OK   No CP   No palpitaitons   No edema    Past Medical History:  Diagnosis Date  . Alcohol abuse   . Anxiety   . Atypical atrial flutter ([place])   . Complete transposition of great vessels   . Depression   . Dyslipidemia   . GERD (gastroesophageal reflux disease)   . Headache    "weekly" (09/24/2014)  . Heart murmur   . Hypertension   . 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 SVT (supraventricular tachycardia) ([place]) "several times in the last year" (09/24/2014)  . 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 FINGER ARTHROPLASTY Right      volar plate arthroplasty, right small finger proxima linterphalangeal joint [Other]  . FRACTURE SURGERY    . SEPTOSTOMY     Rashkind balloon atrial septostomy   . TRANSPOSITION OF GREAT VESSELS REPAIR  1980   "mustard procedure"    Current Medications: Prior to Admission medications   Medication Sig Start Date End Date Taking? Authorizing Provider  acetaminophen (TYLENOL) 500 MG tablet Take 2 tablets (1,000 mg total) by mouth every 8 (eight) hours as needed for headache. 10/03/17 10/03/18  Kathie Dike, MD  escitalopram (LEXAPRO) 10 MG tablet Take 1 tablet (10 mg total) by mouth daily. 09/18/17   Matthew Kirschner, MD  hydrOXYzine (ATARAX/VISTARIL) 25 MG tablet Take 1 tablet (25 mg total) by mouth 3 (three) times daily as needed for anxiety. 10/03/17   Kathie Dike, MD  magnesium oxide (MAG-OX) 400 MG tablet Take 1 tablet (400 mg total) by mouth daily. 10/03/17   Kathie Dike, MD  metoprolol tartrate (LOPRESSOR) 25 MG tablet Take 50 mg (2 tabs) by mouth in AM and 25 mg (1 tab) by mouth in PM. 10/03/17   Kathie Dike, MD  ondansetron (ZOFRAN ODT) 4 MG disintegrating tablet Take 1 tablet (4 mg total) by  mouth every 8 (eight) hours as needed for nausea or vomiting. 10/03/17   Kathie Dike, MD  pantoprazole (PROTONIX) 40 MG tablet Take 1 tablet (40 mg total) by mouth daily. 10/03/17   Kathie Dike, MD  warfarin (COUMADIN) 5 MG tablet Take 1/2 tablet to 1 tablet daily as directed by Coumadin clinic 10/28/17   Fay Records, MD    Allergies:   Aspirin   Social History   Socioeconomic History  . Marital status: Legally Separated    Spouse name: Not on file  . Number of children: Not on file  . Years of education: Not on file  . Highest education level: Not on file  Occupational History  . Not on file  Social Needs  . Financial resource strain: Not on file  . Food insecurity:    Worry: Not on file    Inability: Not on file  . Transportation needs:    Medical: Not on file     Non-medical: Not on file  Tobacco Use  . Smoking status: Never Smoker  . Smokeless tobacco: Current User    Types: Snuff  . Tobacco comment: "dipped when I played softball; none since 2011"  Substance and Sexual Activity  . Alcohol use: Yes    Alcohol/week: 36.0 oz    Types: 60 Shots of liquor per week    Comment: 09/24/2014 "1/5th vodka in 2 days; easy"  . Drug use: No  . Sexual activity: Yes  Lifestyle  . Physical activity:    Days per week: Not on file    Minutes per session: Not on file  . Stress: Not on file  Relationships  . Social connections:    Talks on phone: Not on file    Gets together: Not on file    Attends religious service: Not on file    Active member of club or organization: Not on file    Attends meetings of clubs or organizations: Not on file    Relationship status: Not on file  Other Topics Concern  . Not on file  Social History Narrative  . Not on file     Family History:  The patient's family history includes Hypertension in his father; Rheumatic fever in his father.   ROS:   Please see the history of present illness.    ROS All other systems reviewed and are negative.   PHYSICAL EXAM:   VS:  BP 116/80   Pulse 61   Ht 6' 2.5" (1.892 m)   Wt 228 lb 1.9 oz (103.5 kg)   BMI 28.90 kg/m    GEN: Well nourished, well developed, in no acute distress  HEENT: normal  Neck: no JVD, carotid bruits, or masses Cardiac: RRR; no murmurs, rubs, or gallops,no edema  Respiratory:  clear to auscultation bilaterally, normal work of breathing GI: soft, nontender, nondistended, + BS MS: no deformity or atrophy  Neuro:  Alert and Oriented x 3,  Psych: euthymic mood, full affect  Wt Readings from Last 3 Encounters:  11/14/17 228 lb 1.9 oz (103.5 kg)  11/11/17 237 lb (107.5 kg)  11/10/17 237 lb 9.6 oz (107.8 kg)      Studies/Labs Reviewed:   EKG:  EKG is not ordered today.    Recent Labs: 10/14/2017: Magnesium 1.8 11/11/2017: ALT 15; B Natriuretic Peptide  294.0; Hemoglobin 12.2; Platelets 303 11/14/2017: BUN 6; Creatinine, Ser 0.55; Potassium 4.3; Sodium 145   Lipid Panel    Component Value Date/Time   CHOL 160 10/13/2015 0948  TRIG 49 10/13/2015 0948   HDL 39 (L) 10/13/2015 0948   CHOLHDL 4.1 10/13/2015 0948   VLDL 10 10/13/2015 0948   LDLCALC 111 10/13/2015 0948   LDLDIRECT 162.4 08/10/2012 1143    Additional studies/ records that were reviewed today include:   As above   ASSESSMENT & PLAN:    1. TGV s/p mustard procedure - Last echo 08/2016 technically difficult study.    Clinically he has done well and continueds to do well (when not drinking and on meds ) Volume status good today  He is active     I will review echo     I am not convinced he needs repeat at present   Nor MRI   Would not change current Rx  2  SVT/Atrial flutter   Reported in hosp in March   Then SR   Clinically in SR I would keep on current regimen   3.  HTN -  well controlled on current regimen  4. Alcohol abuse - Off ETOH  Pt says he wont resume  5  Pulmonary  Recoveing     Medication Adjustments/Labs and Tests Ordered: Current medicines are reviewed at length with the patient today.  Concerns regarding medicines are outlined above.  Medication changes, Labs and Tests ordered today are listed in the Patient Instructions below. Patient Instructions  Your physician recommends that you continue on your current medications as directed. Please refer to the Current Medication list given to you today. Your physician recommends that you return for lab work today (BMET)     Signed, Dorris Carnes, MD  12/03/2017 [address], [place], [place]  [address] Phone: [phone]; Fax: [phone]

## 2017-11-14 NOTE — Patient Instructions (Signed)
Your physician recommends that you continue on your current medications as directed. Please refer to the Current Medication list given to you today. Your physician recommends that you return for lab work today Artist)

## 2017-11-15 LAB — BASIC METABOLIC PANEL
BUN/Creatinine Ratio: 11 (ref 9–20)
BUN: 6 mg/dL (ref 6–20)
CO2: 24 mmol/L (ref 20–29)
Calcium: 9.5 mg/dL (ref 8.7–10.2)
Chloride: 108 mmol/L — ABNORMAL HIGH (ref 96–106)
Creatinine, Ser: 0.55 mg/dL — ABNORMAL LOW (ref 0.76–1.27)
GFR calc Af Amer: 152 mL/min/{1.73_m2} (ref >59)
GFR calc non Af Amer: 131 mL/min/{1.73_m2} (ref >59)
Glucose: 79 mg/dL (ref 65–99)
Potassium: 4.3 mmol/L (ref 3.5–5.2)
Sodium: 145 mmol/L — ABNORMAL HIGH (ref 134–144)

## 2017-11-16 LAB — CULTURE, BLOOD (ROUTINE X 2)
Culture: NO GROWTH
Culture: NO GROWTH
Special Requests: ADEQUATE
Special Requests: ADEQUATE

## 2017-12-05 ENCOUNTER — Ambulatory Visit (INDEPENDENT_AMBULATORY_CARE_PROVIDER_SITE_OTHER): Payer: BLUE CROSS/BLUE SHIELD

## 2017-12-05 DIAGNOSIS — Z5181 Encounter for therapeutic drug level monitoring: Secondary | ICD-10-CM | POA: Diagnosis not present

## 2017-12-05 DIAGNOSIS — I4892 Unspecified atrial flutter: Secondary | ICD-10-CM | POA: Diagnosis not present

## 2017-12-05 LAB — POCT INR: INR: 2.1 (ref 2.0–3.0)

## 2017-12-05 MED ORDER — WARFARIN SODIUM 5 MG PO TABS: ORAL_TABLET | ORAL | 1 refills | Status: DC

## 2017-12-05 NOTE — Patient Instructions (Signed)
Description   Take 1.5 tablets today (since you missed 1/2 tablet yesterday) then resume same dosage 1 tablet daily except 1/2 tablet on Monday and Wednesday.  Recheck INR in 4 weeks. Call with any questions or new medications  [phone]

## 2017-12-10 ENCOUNTER — Ambulatory Visit (HOSPITAL_COMMUNITY): Payer: Self-pay | Admitting: Licensed Clinical Social Worker

## 2017-12-27 ENCOUNTER — Telehealth: Payer: Self-pay | Admitting: Internal Medicine

## 2017-12-27 NOTE — Telephone Encounter (Signed)
Patient would like to be evaluated by [facility] cardiology  Please send information (clinic note, echo report, last MRI) To set appt wit h Jeralyn Bennett ([facility]  Office downstairs [phone]

## 2017-12-29 NOTE — Telephone Encounter (Addendum)
Scheduler in Dr. Elaina Hoops office does not schedule new patients.  The main office in [place] does this.  [phone] ph    [phone] (fax). He is in [place] once a month and is booked for the next couple months but may be able to add in sooner.  Spoke to [facility] scheduling. Was instructed to fax records to Dr. Dillon Bjork nurse, Maudie Mercury at [phone]. Records have been faxed for referral.

## 2017-12-29 NOTE — Telephone Encounter (Signed)
Called pt and informed of plan. Gave office number to call to check on status of referral in case he does not hear anything back.

## 2018-01-05 ENCOUNTER — Ambulatory Visit (INDEPENDENT_AMBULATORY_CARE_PROVIDER_SITE_OTHER): Payer: BLUE CROSS/BLUE SHIELD | Admitting: *Deleted

## 2018-01-05 DIAGNOSIS — I4892 Unspecified atrial flutter: Secondary | ICD-10-CM

## 2018-01-05 DIAGNOSIS — Z5181 Encounter for therapeutic drug level monitoring: Secondary | ICD-10-CM | POA: Diagnosis not present

## 2018-01-05 LAB — POCT INR: INR: 2.7 (ref 2.0–3.0)

## 2018-01-05 NOTE — Patient Instructions (Signed)
Description   Continue taking the same dosage 1 tablet daily except 1/2 tablet on Monday and Wednesday.  Recheck INR in 4 weeks. Call with any questions or new medications  [phone]

## 2018-01-08 ENCOUNTER — Other Ambulatory Visit: Payer: Self-pay | Admitting: Family Medicine

## 2018-01-23 DIAGNOSIS — I509 Heart failure, unspecified: Secondary | ICD-10-CM | POA: Insufficient documentation

## 2018-01-27 ENCOUNTER — Encounter: Payer: Self-pay | Admitting: Family Medicine

## 2018-01-27 ENCOUNTER — Ambulatory Visit: Payer: BLUE CROSS/BLUE SHIELD | Admitting: Family Medicine

## 2018-01-27 VITALS — BP 112/80 | Ht 73.0 in | Wt 236.0 lb

## 2018-01-27 DIAGNOSIS — F32A Depression, unspecified: Secondary | ICD-10-CM

## 2018-01-27 DIAGNOSIS — F411 Generalized anxiety disorder: Secondary | ICD-10-CM | POA: Diagnosis not present

## 2018-01-27 DIAGNOSIS — F329 Major depressive disorder, single episode, unspecified: Secondary | ICD-10-CM

## 2018-01-27 MED ORDER — ESCITALOPRAM OXALATE 10 MG PO TABS: 10.0000 mg | ORAL_TABLET | Freq: Every day | ORAL | 5 refills | Status: DC

## 2018-01-27 MED ORDER — HYDROXYZINE HCL 25 MG PO TABS: 25.0000 mg | ORAL_TABLET | Freq: Three times a day (TID) | ORAL | 3 refills | Status: DC | PRN

## 2018-01-27 NOTE — Progress Notes (Signed)
   Subjective:    Patient ID: Matthew [name], male    DOB: 05-01-1976, 42 y.o.   MRN: [id number]  HPI  Patient is here today as a follow up from his visit in April when he had pneumonia  of right lung.He states he is better,but occasionally gets a cough in the am after running a fan all night.  Seen in ER in spril ith dx of pneum, still rare cough    Pt now exercising failry rg, walking a fair amnt    Attending AA, going well  Patient notes ongoing compliance with antidepressant medication. No obvious side effects. Reports does not miss a dose. Overall continues to help depression substantially. No thoughts of homicide or suicide. Would like to maintain medication.  lexapro has helpe a lot      Review of Systems No headache, no major weight loss or weight gain, no chest pain no back pain abdominal pain no change in bowel habits complete ROS otherwise negative     Objective:   Physical Exam Alert vitals stable, NAD. Blood pressure good on repeat. HEENT normal. Lungs clear. Heart regular rate and rhythm.        Assessment & Plan:  Impression depression/anxiety overall substantially improved.  Discussed.  Maintain same medication.  Also working with Alcoholics Anonymous for his chronic alcohol habit.  This is strongly encouraged for patient to maintain.  Medication refilled follow-up in 6 months also uses Atarax as needed for anxiety

## 2018-02-06 ENCOUNTER — Ambulatory Visit (INDEPENDENT_AMBULATORY_CARE_PROVIDER_SITE_OTHER): Payer: BLUE CROSS/BLUE SHIELD | Admitting: *Deleted

## 2018-02-06 DIAGNOSIS — Z5181 Encounter for therapeutic drug level monitoring: Secondary | ICD-10-CM | POA: Diagnosis not present

## 2018-02-06 DIAGNOSIS — I4892 Unspecified atrial flutter: Secondary | ICD-10-CM | POA: Diagnosis not present

## 2018-02-06 LAB — POCT INR: INR: 2.1 (ref 2.0–3.0)

## 2018-02-06 NOTE — Patient Instructions (Signed)
Description   Continue taking the same dosage 1 tablet daily except 1/2 tablet on Monday and Wednesday.  Recheck INR in 6 weeks. Call with any questions or new medications  [phone]     

## 2018-02-12 ENCOUNTER — Other Ambulatory Visit: Payer: Self-pay

## 2018-02-12 ENCOUNTER — Other Ambulatory Visit: Payer: Self-pay | Admitting: Internal Medicine

## 2018-02-12 MED ORDER — WARFARIN SODIUM 5 MG PO TABS: ORAL_TABLET | ORAL | 0 refills | Status: DC

## 2018-02-12 NOTE — Telephone Encounter (Signed)
Refill authorization request received from [facility][id number] for warfarin (coumadin) 5 mg as directed by coumadin clinic

## 2018-03-20 ENCOUNTER — Ambulatory Visit (INDEPENDENT_AMBULATORY_CARE_PROVIDER_SITE_OTHER): Payer: BLUE CROSS/BLUE SHIELD | Admitting: *Deleted

## 2018-03-20 DIAGNOSIS — Z5181 Encounter for therapeutic drug level monitoring: Secondary | ICD-10-CM

## 2018-03-20 DIAGNOSIS — I4892 Unspecified atrial flutter: Secondary | ICD-10-CM

## 2018-03-20 LAB — POCT INR: INR: 2.5 (ref 2.0–3.0)

## 2018-03-20 NOTE — Patient Instructions (Signed)
Description   Continue taking the same dosage 1 tablet daily except 1/2 tablet on Monday and Wednesday.  Recheck INR in 6 weeks. Call with any questions or new medications  [phone]     

## 2018-03-27 ENCOUNTER — Encounter (HOSPITAL_COMMUNITY): Payer: Self-pay | Admitting: Emergency Medicine

## 2018-03-27 ENCOUNTER — Other Ambulatory Visit: Payer: Self-pay

## 2018-03-27 ENCOUNTER — Emergency Department (HOSPITAL_COMMUNITY)
Admission: EM | Admit: 2018-03-27 | Discharge: 2018-03-27 | Disposition: A | Discharge status: Home / Self Care | Payer: BLUE CROSS/BLUE SHIELD | Attending: Emergency Medicine | Admitting: Emergency Medicine

## 2018-03-27 ENCOUNTER — Other Ambulatory Visit: Payer: Self-pay | Admitting: Family Medicine

## 2018-03-27 DIAGNOSIS — I484 Atypical atrial flutter: Secondary | ICD-10-CM

## 2018-03-27 DIAGNOSIS — F17220 Nicotine dependence, chewing tobacco, uncomplicated: Secondary | ICD-10-CM | POA: Diagnosis not present

## 2018-03-27 DIAGNOSIS — Z96691 Finger-joint replacement of right hand: Secondary | ICD-10-CM | POA: Insufficient documentation

## 2018-03-27 DIAGNOSIS — I5022 Chronic systolic (congestive) heart failure: Secondary | ICD-10-CM | POA: Diagnosis not present

## 2018-03-27 DIAGNOSIS — Z7901 Long term (current) use of anticoagulants: Secondary | ICD-10-CM | POA: Insufficient documentation

## 2018-03-27 DIAGNOSIS — I110 Hypertensive heart disease with heart failure: Secondary | ICD-10-CM | POA: Diagnosis not present

## 2018-03-27 DIAGNOSIS — Z79899 Other long term (current) drug therapy: Secondary | ICD-10-CM | POA: Insufficient documentation

## 2018-03-27 DIAGNOSIS — M25521 Pain in right elbow: Secondary | ICD-10-CM | POA: Diagnosis not present

## 2018-03-27 DIAGNOSIS — R002 Palpitations: Secondary | ICD-10-CM | POA: Diagnosis present

## 2018-03-27 LAB — CBC WITH DIFFERENTIAL/PLATELET
Basophils Absolute: 0 10*3/uL (ref 0.0–0.1)
Basophils Relative: 0 %
Eosinophils Absolute: 0.2 10*3/uL (ref 0.0–0.7)
Eosinophils Relative: 2 %
HCT: 44.6 % (ref 39.0–52.0)
Hemoglobin: 15 g/dL (ref 13.0–17.0)
Lymphocytes Relative: 35 %
Lymphs Abs: 3.4 10*3/uL (ref 0.7–4.0)
MCH: 29.1 pg (ref 26.0–34.0)
MCHC: 33.6 g/dL (ref 30.0–36.0)
MCV: 86.6 fL (ref 78.0–100.0)
Monocytes Absolute: 1 10*3/uL (ref 0.1–1.0)
Monocytes Relative: 10 %
Neutro Abs: 5.2 10*3/uL (ref 1.7–7.7)
Neutrophils Relative %: 53 %
Platelets: 265 10*3/uL (ref 150–400)
RBC: 5.15 MIL/uL (ref 4.22–5.81)
RDW: 13.5 % (ref 11.5–15.5)
WBC: 9.8 10*3/uL (ref 4.0–10.5)

## 2018-03-27 LAB — BASIC METABOLIC PANEL
Anion gap: 8 (ref 5–15)
BUN: 10 mg/dL (ref 6–20)
CO2: 27 mmol/L (ref 22–32)
Calcium: 9 mg/dL (ref 8.9–10.3)
Chloride: 105 mmol/L (ref 98–111)
Creatinine, Ser: 0.72 mg/dL (ref 0.61–1.24)
GFR calc Af Amer: >60 mL/min (ref >60)
GFR calc non Af Amer: >60 mL/min (ref >60)
Glucose, Bld: 129 mg/dL — ABNORMAL HIGH (ref 70–99)
Potassium: 4 mmol/L (ref 3.5–5.1)
Sodium: 140 mmol/L (ref 135–145)

## 2018-03-27 LAB — PROTIME-INR
INR: 2.12
Prothrombin Time: 23.6 seconds — ABNORMAL HIGH (ref 11.4–15.2)

## 2018-03-27 LAB — MAGNESIUM: Magnesium: 1.9 mg/dL (ref 1.7–2.4)

## 2018-03-27 MED ORDER — DILTIAZEM HCL-DEXTROSE 100-5 MG/100ML-% IV SOLN (PREMIX)
5.0000 mg/h | INTRAVENOUS | Status: DC
  Administered 2018-03-27: 5 mg/h via INTRAVENOUS

## 2018-03-27 MED ORDER — DILTIAZEM HCL-DEXTROSE 100-5 MG/100ML-% IV SOLN (PREMIX)
INTRAVENOUS | Status: AC
  Filled 2018-03-27: qty 100

## 2018-03-27 MED ORDER — DILTIAZEM LOAD VIA INFUSION
20.0000 mg | Freq: Once | INTRAVENOUS | Status: AC
  Administered 2018-03-27: 20 mg via INTRAVENOUS
  Filled 2018-03-27: qty 20

## 2018-03-27 NOTE — Discharge Instructions (Addendum)
Follow-up with cardiology at [facility] as previously scheduled.

## 2018-03-27 NOTE — ED Provider Notes (Signed)
[facility] EMERGENCY DEPARTMENT Provider Note   CSN: [id number] Arrival date & time: 03/27/18  0737     History   Chief Complaint Chief Complaint  Patient presents with  . Tachycardia    HPI Matthew [name] is a 42 y.o. male.  HPI   42 year old male with palpitations.  Arrived to the emergency room with a heart rate 180s.  He has a past history of atypical atrial flutter.  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  Reports that he went to sleep in his usual state of health.  Woke up this morning shortly before arrival with palpitations. Has been going on hour about an hour now.   Mild ache in his right elbow, otherwise denies any other associated symptoms.  He reports compliance with his medication and did take 50 mg of metoprolol this morning.  He says he has been sober from alcohol for several months.  He has not had an episode of palpitations approximately a year.  He is followed locally by Dr. Dorris Carnes.  He reports that he has an upcoming evaluation at [facility].  Past Medical History:  Diagnosis Date  . Alcohol abuse   . Anxiety   . Atypical atrial flutter ([place])   . Complete transposition of great vessels   . Depression   . Dyslipidemia   . GERD (gastroesophageal reflux disease)   . Headache    "weekly" (09/24/2014)  . Heart murmur   . Hypertension   . 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 SVT (supraventricular tachycardia) ([place]) "several times in the last year" (09/24/2014)  . 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 MDD (major depressive disorder), recurrent episode, moderate ([place]) 10/03/2017  . Abnormal EKG   . Nonspecific chest pain   . Hyponatremia 09/29/2017  . Transposition of great vessel 09/29/2017  . Dyslipidemia 09/29/2017  . GERD (gastroesophageal reflux disease) 09/29/2017  . Leukocytosis 09/29/2017  . Hyperbilirubinemia  09/29/2017  . Generalized anxiety disorder 09/18/2017  . Atrial flutter with rapid ventricular response ([place]) 09/23/2014  . Chronic systolic congestive heart failure ([place])   . Tachycardia 07/28/2014  . SVT (supraventricular tachycardia) ([place])   . Hypocalcemia   . Transaminitis   . A-fib ([place]) 04/21/2014  . Encounter for therapeutic drug monitoring 01/07/2014  . Hallucination 01/04/2014  . Chronic systolic CHF (congestive heart failure) ([place]) 01/01/2014  . Pulmonary hypertension ([place]) 12/31/2013  . Metabolic acidosis 05/11/2535  . Protein-calorie malnutrition, severe ([place]) 12/30/2013  . Nausea vomiting and diarrhea 12/29/2013  . Atrial flutter ([place]) 12/29/2013  . Hypokalemia 12/29/2013  . Hypomagnesemia 12/29/2013  . Alcohol abuse 12/29/2013  . Alcohol withdrawal ([place]) 12/29/2013  . Dehydration with hyponatremia 12/29/2013  . Depression 12/29/2013  . Hyperglycemia 12/29/2013  . Melena 12/29/2013  . Elevated transaminase level 12/29/2013  . HYPERLIPIDEMIA-MIXED 04/21/2009  . HYPERTENSION, BENIGN 04/21/2009  . OTHER DISORDERS PAPILLARY MUSCLE 01/24/2009  . 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 FINGER ARTHROPLASTY Right    volar plate arthroplasty, right small finger proxima linterphalangeal joint [Other]  . FRACTURE SURGERY    . SEPTOSTOMY     Rashkind balloon atrial septostomy   . TRANSPOSITION OF GREAT VESSELS REPAIR  1980   "mustard procedure"        Home Medications    Prior  to Admission medications   Medication Sig Start Date End Date Taking? Authorizing Provider  azithromycin (ZITHROMAX Z-PAK) 250 MG tablet Take 1 tablet (250 mg total) by mouth daily. 500mg  PO day 1, then 250mg  PO days 205 Patient not taking: Reported on 01/27/2018 11/11/17   Noemi Chapel, MD  escitalopram (LEXAPRO) 10 MG tablet Take 1 tablet (10 mg total) by mouth daily. 01/27/18   Mikey Kirschner, MD  hydrOXYzine  (ATARAX/VISTARIL) 25 MG tablet Take 1 tablet (25 mg total) by mouth 3 (three) times daily as needed. 01/27/18   Mikey Kirschner, MD  losartan (COZAAR) 50 MG tablet Take 1 tablet (50 mg total) by mouth daily. 10/29/17 01/27/18  Leanor Kail, PA  metoprolol tartrate (LOPRESSOR) 25 MG tablet Take 50 mg (2 tabs) by mouth in AM and 25 mg (1 tab) by mouth in PM. 10/03/17   Kathie Dike, MD  ondansetron (ZOFRAN ODT) 4 MG disintegrating tablet Take 1 tablet (4 mg total) by mouth every 8 (eight) hours as needed for nausea or vomiting. Patient not taking: Reported on 01/27/2018 10/03/17   Kathie Dike, MD  SALINE NASAL MIST NA Place 1-2 sprays into the nose daily as needed (for congestion).    [provider]  warfarin (COUMADIN) 5 MG tablet TAKE 1/2 TO 1 (ONE-HALF TO ONE) TABLET BY MOUTH DAILY AS DIRECTED BY  COUMADIN  CLINIC 02/12/18   Fay Records, MD    Family History Family History  Problem Relation Age of Onset  . Rheumatic fever Father   . Hypertension Father     Social History Social History   Tobacco Use  . Smoking status: Never Smoker  . Smokeless tobacco: Current User    Types: Snuff  . Tobacco comment: "dipped when I played softball; none since 2011"  Substance Use Topics  . Alcohol use: Yes    Alcohol/week: 60.0 standard drinks    Types: 60 Shots of liquor per week    Comment: 09/24/2014 "1/5th vodka in 2 days; easy"  . Drug use: No     Allergies   Aspirin   Review of Systems Review of Systems  All systems reviewed and negative, other than as noted in HPI.  Physical Exam Updated Vital Signs BP (!) 123/96   Pulse (!) 186   Resp (!) 26   Ht 5\' 10"  (1.778 m)   Wt 107 kg   SpO2 99%   BMI 33.85 kg/m   Physical Exam  Constitutional: He appears well-developed and well-nourished. No distress.  HENT:  Head: Normocephalic and atraumatic.  Eyes: Conjunctivae are normal. Right eye exhibits no discharge. Left eye exhibits no discharge.  Neck: Neck  supple.  Cardiovascular: Regular rhythm and normal heart sounds. Exam reveals no gallop and no friction rub.  No murmur heard. Marked tachycardia.  Sounds regular.  Pulmonary/Chest: Effort normal and breath sounds normal. No respiratory distress.  Abdominal: Soft. He exhibits no distension. There is no tenderness.  Musculoskeletal: He exhibits no edema or tenderness.  Neurological: He is alert.  Skin: Skin is warm and dry.  Psychiatric: He has a normal mood and affect. His behavior is normal. Thought content normal.  Nursing note and vitals reviewed.    ED Treatments / Results  Labs (all labs ordered are listed, but only abnormal results are displayed) Labs Reviewed  BASIC METABOLIC PANEL - Abnormal; Notable for the following components:      Result Value   Glucose, Bld 129 (*)    All other components within  normal limits  PROTIME-INR - Abnormal; Notable for the following components:   Prothrombin Time 23.6 (*)    All other components within normal limits  CBC WITH DIFFERENTIAL/PLATELET  MAGNESIUM    EKG EKG Interpretation  Date/Time:  Friday March 27 2018 07:47:27 EDT Ventricular Rate:  185 PR Interval:    QRS Duration: 93 QT Interval:  289 QTC Calculation: 507 R Axis:   91 Text Interpretation:  Supraventricular tachycardia RVH with secondary repolarization abnrm Repolarization abnormality, prob rate related Confirmed by Virgel Manifold [phone]) on 03/27/2018 7:56:13 AM   Radiology No results found.  Procedures Procedures (including critical care time)  CRITICAL CARE Performed by: Virgel Manifold Total critical care time: 35 minutes Critical care time was exclusive of separately billable procedures and treating other patients. Critical care was necessary to treat or prevent imminent or life-threatening deterioration. Critical care was time spent personally by me on the following activities: development of treatment plan with patient and/or surrogate as well as  nursing, discussions with consultants, evaluation of patient's response to treatment, examination of patient, obtaining history from patient or surrogate, ordering and performing treatments and interventions, ordering and review of laboratory studies, ordering and review of radiographic studies, pulse oximetry and re-evaluation of patient's condition.   Medications Ordered in ED Medications  diltiazem (CARDIZEM) 1 mg/mL load via infusion 20 mg (5 mg/hr Intravenous Not Given 03/27/18 0805)     Initial Impression / Assessment and Plan / ED Course  I have reviewed the triage vital signs and the nursing notes.  Pertinent labs & imaging results that were available during my care of the patient were reviewed by me and considered in my medical decision making (see chart for details).     41yM with palpitations.  EKG with a regular narrow complex tachycardia with rate around 180.  Probably atypical atrial flutter based on his past history. Minimal symptoms aside from palpitations.  He reports compliance with meds including 50 mg of metoprolol this morning. Says he has been sober for several months.   Patient converted fairly quickly on Cardizem.  He has no complaints.  Electrolytes are fine.  INR is therapeutic.  He has a prescheduled cardiology appointment in the upcoming week.  Emergent return precautions were discussed.   Final Clinical Impressions(s) / ED Diagnoses   Final diagnoses:  Atypical atrial flutter [facility])    ED Discharge Orders    None       Virgel Manifold, MD 03/28/18 1217

## 2018-03-27 NOTE — ED Triage Notes (Signed)
Pt reports sudden onset tachycardia this am while lying in bed. Pt denies chest pain but reports bilateral elbow pain. Pt reports intermittent shortness of breath. Pt able to speak clear complete sentences. Pt non diaphoretic. Pt reports history of same.

## 2018-03-30 ENCOUNTER — Encounter (HOSPITAL_COMMUNITY): Payer: Self-pay | Admitting: Emergency Medicine

## 2018-03-30 ENCOUNTER — Observation Stay
Admission: AD | Admit: 2018-03-30 | Payer: BLUE CROSS/BLUE SHIELD | Source: Ambulatory Visit | Admitting: Internal Medicine

## 2018-03-30 ENCOUNTER — Ambulatory Visit: Payer: BLUE CROSS/BLUE SHIELD | Admitting: Internal Medicine

## 2018-03-30 ENCOUNTER — Encounter: Payer: Self-pay | Admitting: Internal Medicine

## 2018-03-30 ENCOUNTER — Observation Stay (HOSPITAL_COMMUNITY)
Admission: EM | Admit: 2018-03-30 | Discharge: 2018-03-31 | DRG: 310 | Disposition: A | Discharge status: Home / Self Care | Payer: BLUE CROSS/BLUE SHIELD | Attending: Cardiovascular Disease | Admitting: Cardiovascular Disease

## 2018-03-30 ENCOUNTER — Telehealth: Payer: Self-pay | Admitting: Internal Medicine

## 2018-03-30 ENCOUNTER — Other Ambulatory Visit: Payer: Self-pay

## 2018-03-30 VITALS — HR 166 | Ht 70.0 in | Wt 238.0 lb

## 2018-03-30 DIAGNOSIS — I484 Atypical atrial flutter: Secondary | ICD-10-CM | POA: Diagnosis not present

## 2018-03-30 DIAGNOSIS — I4892 Unspecified atrial flutter: Secondary | ICD-10-CM | POA: Diagnosis present

## 2018-03-30 DIAGNOSIS — I4891 Unspecified atrial fibrillation: Secondary | ICD-10-CM | POA: Diagnosis not present

## 2018-03-30 DIAGNOSIS — Z886 Allergy status to analgesic agent status: Secondary | ICD-10-CM | POA: Diagnosis not present

## 2018-03-30 DIAGNOSIS — Z79899 Other long term (current) drug therapy: Secondary | ICD-10-CM

## 2018-03-30 DIAGNOSIS — I10 Essential (primary) hypertension: Secondary | ICD-10-CM | POA: Diagnosis not present

## 2018-03-30 DIAGNOSIS — Z7901 Long term (current) use of anticoagulants: Secondary | ICD-10-CM

## 2018-03-30 DIAGNOSIS — R000 Tachycardia, unspecified: Secondary | ICD-10-CM | POA: Diagnosis not present

## 2018-03-30 LAB — BASIC METABOLIC PANEL
Anion gap: 11 (ref 5–15)
BUN: 8 mg/dL (ref 6–20)
CO2: 24 mmol/L (ref 22–32)
Calcium: 9.5 mg/dL (ref 8.9–10.3)
Chloride: 107 mmol/L (ref 98–111)
Creatinine, Ser: 0.87 mg/dL (ref 0.61–1.24)
GFR calc Af Amer: >60 mL/min (ref >60)
GFR calc non Af Amer: >60 mL/min (ref >60)
Glucose, Bld: 128 mg/dL — ABNORMAL HIGH (ref 70–99)
Potassium: 4.1 mmol/L (ref 3.5–5.1)
Sodium: 142 mmol/L (ref 135–145)

## 2018-03-30 LAB — CBC WITH DIFFERENTIAL/PLATELET
Abs Immature Granulocytes: 0 10*3/uL (ref 0.0–0.1)
Basophils Absolute: 0 10*3/uL (ref 0.0–0.1)
Basophils Relative: 0 %
Eosinophils Absolute: 0.1 10*3/uL (ref 0.0–0.7)
Eosinophils Relative: 1 %
HCT: 44.2 % (ref 39.0–52.0)
Hemoglobin: 15 g/dL (ref 13.0–17.0)
Immature Granulocytes: 0 %
Lymphocytes Relative: 23 %
Lymphs Abs: 1.9 10*3/uL (ref 0.7–4.0)
MCH: 29 pg (ref 26.0–34.0)
MCHC: 33.9 g/dL (ref 30.0–36.0)
MCV: 85.3 fL (ref 78.0–100.0)
Monocytes Absolute: 0.7 10*3/uL (ref 0.1–1.0)
Monocytes Relative: 9 %
Neutro Abs: 5.4 10*3/uL (ref 1.7–7.7)
Neutrophils Relative %: 67 %
Platelets: 292 10*3/uL (ref 150–400)
RBC: 5.18 MIL/uL (ref 4.22–5.81)
RDW: 13.2 % (ref 11.5–15.5)
WBC: 8.2 10*3/uL (ref 4.0–10.5)

## 2018-03-30 LAB — PROTIME-INR
INR: 2.72
Prothrombin Time: 28.6 seconds — ABNORMAL HIGH (ref 11.4–15.2)

## 2018-03-30 LAB — MAGNESIUM: Magnesium: 1.7 mg/dL (ref 1.7–2.4)

## 2018-03-30 MED ORDER — MORPHINE SULFATE (PF) 4 MG/ML IV SOLN
4.0000 mg | Freq: Once | INTRAVENOUS | Status: AC
  Administered 2018-03-30: 4 mg via INTRAVENOUS
  Filled 2018-03-30: qty 1

## 2018-03-30 MED ORDER — ONDANSETRON HCL 4 MG/2ML IJ SOLN: 4.0000 mg | Freq: Four times a day (QID) | INTRAMUSCULAR | Status: DC | PRN

## 2018-03-30 MED ORDER — ACETAMINOPHEN 325 MG PO TABS: 650.0000 mg | ORAL_TABLET | ORAL | Status: DC | PRN

## 2018-03-30 MED ORDER — ETOMIDATE 2 MG/ML IV SOLN
10.0000 mg | Freq: Once | INTRAVENOUS | Status: AC
  Administered 2018-03-30: 10 mg via INTRAVENOUS
  Filled 2018-03-30: qty 10

## 2018-03-30 MED ORDER — DILTIAZEM LOAD VIA INFUSION
20.0000 mg | Freq: Once | INTRAVENOUS | Status: AC
  Administered 2018-03-30: 20 mg via INTRAVENOUS
  Filled 2018-03-30: qty 20

## 2018-03-30 MED ORDER — SODIUM CHLORIDE 0.9 % IV SOLN
250.0000 mL | INTRAVENOUS | Status: DC
  Administered 2018-03-30: 250 mL via INTRAVENOUS

## 2018-03-30 MED ORDER — WARFARIN SODIUM 2.5 MG PO TABS: 2.5000 mg | ORAL_TABLET | ORAL | Status: DC

## 2018-03-30 MED ORDER — SODIUM CHLORIDE 0.9% FLUSH
3.0000 mL | Freq: Two times a day (BID) | INTRAVENOUS | Status: DC
  Administered 2018-03-30 – 2018-03-31 (×2): 3 mL via INTRAVENOUS

## 2018-03-30 MED ORDER — WARFARIN SODIUM 2.5 MG PO TABS
2.5000 mg | ORAL_TABLET | Freq: Once | ORAL | Status: AC
  Administered 2018-03-30: 2.5 mg via ORAL
  Filled 2018-03-30 (×2): qty 1

## 2018-03-30 MED ORDER — ESCITALOPRAM OXALATE 10 MG PO TABS
10.0000 mg | ORAL_TABLET | Freq: Every day | ORAL | Status: DC
  Administered 2018-03-31: 10 mg via ORAL
  Filled 2018-03-30: qty 1

## 2018-03-30 MED ORDER — SODIUM CHLORIDE 0.9% FLUSH: 3.0000 mL | INTRAVENOUS | Status: DC | PRN

## 2018-03-30 MED ORDER — DILTIAZEM HCL-DEXTROSE 100-5 MG/100ML-% IV SOLN (PREMIX)
5.0000 mg/h | INTRAVENOUS | Status: DC
  Administered 2018-03-30: 5 mg/h via INTRAVENOUS

## 2018-03-30 MED ORDER — WARFARIN - PHARMACIST DOSING INPATIENT: Freq: Every day | Status: DC

## 2018-03-30 MED ORDER — ESCITALOPRAM OXALATE 10 MG PO TABS: 10.0000 mg | ORAL_TABLET | Freq: Every day | ORAL | Status: DC

## 2018-03-30 NOTE — Telephone Encounter (Signed)
Spoke to patient is in A fib rhythm at a rate of 120s.  He was in the ED last Friday with a rate of 190.  They were able to bring rate down, but it is now elevated.  He feels a fluttering in his chest.  Dr Harrington Challenger was having him go to [facility] Wednesday, but she would like to see him in office today.  I informed patient and he will make it in.

## 2018-03-30 NOTE — Telephone Encounter (Signed)
New Message   Patient c/o Palpitations:  High priority if patient c/o lightheadedness, shortness of breath, or chest pain  1) How long have you had palpitations/irregular HR/ Afib? Are you having the symptoms now? Since Friday, yes  2) Are you currently experiencing lightheadedness, SOB or CP? No  3) Do you have a history of afib (atrial fibrillation) or irregular heart rhythm? yes  4) Have you checked your BP or HR? (document readings if available): HR 120  5) Are you experiencing any other symptoms? No   Patient states that he was at the emergency room Friday.

## 2018-03-30 NOTE — Patient Instructions (Signed)
Patient taken by EMS to [facility] ER

## 2018-03-30 NOTE — ED Notes (Signed)
Cardiologist in to assess pt at this time.   

## 2018-03-30 NOTE — ED Provider Notes (Signed)
[place] EMERGENCY DEPARTMENT Provider Note   CSN: [id number] Arrival date & time: 03/30/18  1642     History   Chief Complaint Chief Complaint  Patient presents with  . Tachycardia    HPI Matthew [name] is a 42 y.o. male.  HPI  41yM with palpitations. I actually saw him in the ED at [facility] on Friday for the same. 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 Atypical atrial flutter. I saw him on Friday when he was symptomatic with rate in 180s. Converted with cardizem and asymptomatic. Electrolytes fine and INR therapeutic. Hadn't had symptoms from his in about a year prior. Had appointment with cardiology at [facility] on Wednesday and advised to take an additional 25 mg of metoprolol PRN until then. Developed symptoms again Saturday while at son's soccer game and has been intermittently since. Had walk in appointment with cardiology today and referred to the ED after found to be in afib with RVR with rate around 200.   Past Medical History:  Diagnosis Date  . Alcohol abuse   . Anxiety   . Atypical atrial flutter ([place])   . Complete transposition of great vessels   . Depression   . Dyslipidemia   . GERD (gastroesophageal reflux disease)   . Headache    "weekly" (09/24/2014)  . Heart murmur   . Hypertension   . 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 SVT (supraventricular tachycardia) ([place]) "several times in the last year" (09/24/2014)  . 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 MDD (major depressive disorder), recurrent episode, moderate ([place]) 10/03/2017  . Abnormal EKG   . Nonspecific chest pain   . Hyponatremia 09/29/2017  . Transposition of great vessel 09/29/2017  . Dyslipidemia 09/29/2017  . GERD (gastroesophageal reflux disease) 09/29/2017  . Leukocytosis 09/29/2017  . Hyperbilirubinemia 09/29/2017  . Generalized anxiety disorder 09/18/2017    . Atrial flutter with rapid ventricular response ([place]) 09/23/2014  . Chronic systolic congestive heart failure ([place])   . Tachycardia 07/28/2014  . SVT (supraventricular tachycardia) ([place])   . Hypocalcemia   . Transaminitis   . A-fib ([place]) 04/21/2014  . Encounter for therapeutic drug monitoring 01/07/2014  . Hallucination 01/04/2014  . Chronic systolic CHF (congestive heart failure) ([place]) 01/01/2014  . Pulmonary hypertension ([place]) 12/31/2013  . Metabolic acidosis 55/73/2202  . Protein-calorie malnutrition, severe ([place]) 12/30/2013  . Nausea vomiting and diarrhea 12/29/2013  . Atrial flutter ([place]) 12/29/2013  . Hypokalemia 12/29/2013  . Hypomagnesemia 12/29/2013  . Alcohol abuse 12/29/2013  . Alcohol withdrawal ([place]) 12/29/2013  . Dehydration with hyponatremia 12/29/2013  . Depression 12/29/2013  . Hyperglycemia 12/29/2013  . Melena 12/29/2013  . Elevated transaminase level 12/29/2013  . HYPERLIPIDEMIA-MIXED 04/21/2009  . HYPERTENSION, BENIGN 04/21/2009  . OTHER DISORDERS PAPILLARY MUSCLE 01/24/2009  . 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 FINGER ARTHROPLASTY Right    volar plate arthroplasty, right small finger proxima linterphalangeal joint [Other]  . FRACTURE SURGERY    . SEPTOSTOMY     Rashkind balloon atrial septostomy   . TRANSPOSITION OF GREAT VESSELS REPAIR  1980   "mustard procedure"        Home Medications    Prior to Admission medications   Medication Sig Start Date End Date Taking? Authorizing Provider  escitalopram (LEXAPRO) 10 MG  tablet TAKE 1 TABLET BY MOUTH ONCE DAILY 03/30/18   Mikey Kirschner, MD  hydrOXYzine (ATARAX/VISTARIL) 25 MG tablet Take 1 tablet (25 mg total) by mouth 3 (three) times daily as needed. 01/27/18   Mikey Kirschner, MD  losartan (COZAAR) 50 MG tablet Take 1 tablet (50 mg total) by mouth daily. 10/29/17 03/30/18  Leanor Kail, PA  metoprolol tartrate  (LOPRESSOR) 25 MG tablet Take 50 mg (2 tabs) by mouth in AM and 25 mg (1 tab) by mouth in PM. 10/03/17   Kathie Dike, MD  SALINE NASAL MIST NA Place 1-2 sprays into the nose daily as needed (for congestion).    [provider]  warfarin (COUMADIN) 5 MG tablet TAKE 1/2 TO 1 (ONE-HALF TO ONE) TABLET BY MOUTH DAILY AS DIRECTED BY  COUMADIN  CLINIC 02/12/18   Fay Records, MD    Family History Family History  Problem Relation Age of Onset  . Rheumatic fever Father   . Hypertension Father     Social History Social History   Tobacco Use  . Smoking status: Never Smoker  . Smokeless tobacco: Current User    Types: Snuff  . Tobacco comment: "dipped when I played softball; none since 2011"  Substance Use Topics  . Alcohol use: Yes    Alcohol/week: 60.0 standard drinks    Types: 60 Shots of liquor per week    Comment: 09/24/2014 "1/5th vodka in 2 days; easy"  . Drug use: No     Allergies   Aspirin   Review of Systems Review of Systems  All systems reviewed and negative, other than as noted in HPI.  Physical Exam Updated Vital Signs Ht 5\' 10"  (1.778 m)   Wt 107.9 kg   BMI 34.13 kg/m   Physical Exam  Constitutional: He appears well-developed and well-nourished. No distress.  HENT:  Head: Normocephalic and atraumatic.  Eyes: Conjunctivae are normal. Right eye exhibits no discharge. Left eye exhibits no discharge.  Neck: Neck supple.  Cardiovascular: Normal heart sounds. Exam reveals no gallop and no friction rub.  No murmur heard. Very tachy. Regular.   Pulmonary/Chest: Effort normal and breath sounds normal. No respiratory distress.  Abdominal: Soft. He exhibits no distension. There is no tenderness.  Musculoskeletal: He exhibits no edema or tenderness.  Neurological: He is alert.  Skin: Skin is warm and dry.  Psychiatric: His behavior is normal. Thought content normal.  anxious  Nursing note and vitals reviewed.    ED Treatments / Results  Labs (all  labs ordered are listed, but only abnormal results are displayed) Labs Reviewed  BASIC METABOLIC PANEL - Abnormal; Notable for the following components:      Result Value   Glucose, Bld 128 (*)    All other components within normal limits  PROTIME-INR - Abnormal; Notable for the following components:   Prothrombin Time 28.6 (*)    All other components within normal limits  CBC WITH DIFFERENTIAL/PLATELET  MAGNESIUM  BASIC METABOLIC PANEL  CBC  PROTIME-INR    EKG EKG Interpretation  Date/Time:  Monday March 30 2018 16:45:20 EDT Ventricular Rate:  203 PR Interval:    QRS Duration: 90 QT Interval:  239 QTC Calculation: 440 R Axis:   126 Text Interpretation:  Supraventricular tachycardia RVH with secondary repolarization abnrm Repolarization abnormality, prob rate related Confirmed by Virgel Manifold [phone]) on 03/30/2018 4:58:01 PM   Radiology No results found.  Procedures .Sedation Date/Time: 03/30/2018 7:30 PM Performed by: Virgel Manifold, MD Authorized by: Wilson Singer,  Annie Main, MD   Consent:    Consent obtained:  Verbal   Consent given by:  Patient   Risks discussed:  Allergic reaction, dysrhythmia, inadequate sedation and prolonged hypoxia resulting in organ damage Universal protocol:    Procedure explained and questions answered to patient or proxy's satisfaction: yes     Relevant documents present and verified: yes     Test results available and properly labeled: yes     Imaging studies available: yes     Required blood products, implants, devices, and special equipment available: yes     Immediately prior to procedure a time out was called: yes     Patient identity confirmation method:  Provided demographic data, verbally with patient and arm band Indications:    Procedure performed:  Cardioversion   Procedure necessitating sedation performed by:  Different physician   Intended level of sedation:  Deep Pre-sedation assessment:    Time since last food or drink:   .   ASA classification: class 2 - patient with mild systemic disease     Neck mobility: normal     Mouth opening:  3 or more finger widths   Thyromental distance:  3 finger widths   Mallampati score:  II - soft palate, uvula, fauces visible   Pre-sedation assessments completed and reviewed: airway patency, cardiovascular function, mental status, nausea/vomiting, respiratory function and temperature   Immediate pre-procedure details:    Reassessment: Patient reassessed immediately prior to procedure     Reviewed: vital signs     Verified: bag valve mask available, emergency equipment available, intubation equipment available, IV patency confirmed, oxygen available and suction available   Procedure details (see MAR for exact dosages):    Preoxygenation:  Nasal cannula   Sedation:  Etomidate   Analgesia:  Morphine   Intra-procedure monitoring:  Continuous pulse oximetry, frequent LOC assessments, frequent vital sign checks, cardiac monitor and blood pressure monitoring   Intra-procedure events: none     Total Provider sedation time (minutes):  31 Post-procedure details:    Post-sedation assessment completed:  03/30/2018 8:04 PM   Attendance: Constant attendance by certified staff until patient recovered     Recovery: Patient returned to pre-procedure baseline     Post-sedation assessments completed and reviewed: airway patency, cardiovascular function, mental status and respiratory function     Patient is stable for discharge or admission: yes     Patient tolerance:  Tolerated well, no immediate complications    CRITICAL CARE Performed by: Virgel Manifold Total critical care time: 35 minutes Critical care time was exclusive of separately billable procedures and treating other patients. Critical care was necessary to treat or prevent imminent or life-threatening deterioration. Critical care was time spent personally by me on the following activities: development of treatment plan with  patient and/or surrogate as well as nursing, discussions with consultants, evaluation of patient's response to treatment, examination of patient, obtaining history from patient or surrogate, ordering and performing treatments and interventions, ordering and review of laboratory studies, ordering and review of radiographic studies, pulse oximetry and re-evaluation of patient's condition.   (including critical care time)  Medications Ordered in ED Medications  diltiazem (CARDIZEM) 1 mg/mL load via infusion 20 mg (20 mg Intravenous Bolus from Bag 03/30/18 1649)    And  diltiazem (CARDIZEM) 100 mg in dextrose 5% 160mL (1 mg/mL) infusion (5 mg/hr Intravenous New Bag/Given 03/30/18 1658)     Initial Impression / Assessment and Plan / ED Course  I have reviewed the triage vital  signs and the nursing notes.  Pertinent labs & imaging results that were available during my care of the patient were reviewed by me and considered in my medical decision making (see chart for details).     41yM with afib with RVR. Slowed to 80-90s with cardizem but remains in afib. Has been intermittently symptomatic for several days. Will discuss with cardiology.  Cardioverted in ED. Appreciate cardiology help.   Final Clinical Impressions(s) / ED Diagnoses   Final diagnoses:  Atypical atrial flutter [facility])    ED Discharge Orders    None       Virgel Manifold, MD 03/30/18 2006

## 2018-03-30 NOTE — Progress Notes (Addendum)
Cardiology Office Note    Date:  03/30/2018   ID:  Matthew [name], DOB [birth year], MRN [id number]  PCP:  Mikey Kirschner, MD  Cardiologist:  Dr. Harrington Challenger EP: Dr. Lovena Le Chief Complaint:  F/U of TGV  History of Present Illness:   Matthew [name] is a 42 y.o. 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 He has been evaluated by EP (Dr. Lovena Le) in 2015 who had arranged for him to undergo radiofrequency ablation at [place] echo in Feb 2017  Difficult study   RVEF 35%   Mild systemic AV vlave regurg; mod nonsystemic AV valve regurg  Baffles not well seen  Admitted to [facility] 09/2017 for alcohol intoxication (relaps after many years).  Patient does not wish to have inpatient rehab program and  he wants to keep his job.  Coumadin was resumed   He was reported in [place] then self converted to SR  (he had been off meds for a month)   Seen by [facility] in April 2019  Doing good at that time  Some brief dyspnea withextreme lifting   No edema  No palpitations   I saw him in May   DOing OK  At time  HE says he hasa been doing good   Active   No SOB   Has 3 kids  Friday woke up   Feeling heart race   Felt bad   Went to [facility] ED   Atrial flutter   Rats 180   IV diltiazem started and by ED report pt converted    No EKG done    Saturday pt felt a little winded   Went to son's soccer game then took it easy the rest of the afternoon Sunday felt bad   Weak  Laid in bed most of day Today tired  COntinues to feel bad  Weak   Scared     Denies  SOB with activity   Called office   Here in clinic  Past Medical History:  Diagnosis Date  . Alcohol abuse   . Anxiety   . Atypical atrial flutter ([place])   . Complete transposition of great vessels   . Depression   . Dyslipidemia   . GERD (gastroesophageal reflux disease)   . Headache    "weekly"  (09/24/2014)  . Heart murmur   . Hypertension   . 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 SVT (supraventricular tachycardia) ([place]) "several times in the last year" (09/24/2014)  . 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 FINGER ARTHROPLASTY Right    volar plate arthroplasty, right small finger proxima linterphalangeal joint [Other]  . FRACTURE SURGERY    . SEPTOSTOMY     Rashkind balloon atrial septostomy   . TRANSPOSITION OF GREAT VESSELS REPAIR  1980   "mustard procedure"    Current Medications: Prior to Admission medications   Medication Sig Start Date End Date Taking? Authorizing Provider  acetaminophen (TYLENOL) 500 MG tablet Take 2 tablets (1,000 mg total) by mouth every 8 (eight) hours as needed for headache. 10/03/17 10/03/18  Kathie Dike, MD  escitalopram (LEXAPRO) 10 MG tablet Take 1 tablet (10 mg total) by mouth daily. 09/18/17   Mikey Kirschner, MD  hydrOXYzine (ATARAX/VISTARIL) 25 MG tablet Take 1  tablet (25 mg total) by mouth 3 (three) times daily as needed for anxiety. 10/03/17   Kathie Dike, MD  magnesium oxide (MAG-OX) 400 MG tablet Take 1 tablet (400 mg total) by mouth daily. 10/03/17   Kathie Dike, MD  metoprolol tartrate (LOPRESSOR) 25 MG tablet Take 50 mg (2 tabs) by mouth in AM and 25 mg (1 tab) by mouth in PM. 10/03/17   Kathie Dike, MD  ondansetron (ZOFRAN ODT) 4 MG disintegrating tablet Take 1 tablet (4 mg total) by mouth every 8 (eight) hours as needed for nausea or vomiting. 10/03/17   Kathie Dike, MD  pantoprazole (PROTONIX) 40 MG tablet Take 1 tablet (40 mg total) by mouth daily. 10/03/17   Kathie Dike, MD  warfarin (COUMADIN) 5 MG tablet Take 1/2 tablet to 1 tablet daily as directed by Coumadin clinic 10/28/17   Fay Records, MD    Allergies:   Aspirin   Social History   Socioeconomic History  . Marital status: Legally Separated    Spouse name: Not on file    . Number of children: Not on file  . Years of education: Not on file  . Highest education level: Not on file  Occupational History  . Not on file  Social Needs  . Financial resource strain: Not on file  . Food insecurity:    Worry: Not on file    Inability: Not on file  . Transportation needs:    Medical: Not on file    Non-medical: Not on file  Tobacco Use  . Smoking status: Never Smoker  . Smokeless tobacco: Current User    Types: Snuff  . Tobacco comment: "dipped when I played softball; none since 2011"  Substance and Sexual Activity  . Alcohol use: Yes    Alcohol/week: 60.0 standard drinks    Types: 60 Shots of liquor per week    Comment: 09/24/2014 "1/5th vodka in 2 days; easy"  . Drug use: No  . Sexual activity: Yes  Lifestyle  . Physical activity:    Days per week: Not on file    Minutes per session: Not on file  . Stress: Not on file  Relationships  . Social connections:    Talks on phone: Not on file    Gets together: Not on file    Attends religious service: Not on file    Active member of club or organization: Not on file    Attends meetings of clubs or organizations: Not on file    Relationship status: Not on file  Other Topics Concern  . Not on file  Social History Narrative  . Not on file     Family History:  The patient's family history includes Hypertension in his father; Rheumatic fever in his father.   ROS:   Please see the history of present illness.    ROS All other systems reviewed and are negative.   PHYSICAL EXAM:   VS:  Pulse (!) 166   Ht 5\' 10"  (1.778 m)   Wt 238 lb (108 kg)   BMI 34.15 kg/m     Unable to get BP    GEN: Overweight 42 yo in mld distress    HEENT: NCAT Neck: no JVD, carotid bruits, or masses Cardiac: RRR; no murmurs, rubs, or gallops,no edema   1_ pulses in feet  Respiratory:  clear to auscultation bilaterally, normal work of breathing GI: soft, nontender, nondistended, + BS MS: no deformity or atrophy  Neuro:   Alert and Oriented  x 3,  Psych: euthymic mood, full affect  Wt Readings from Last 3 Encounters:  03/30/18 238 lb (108 kg)  03/27/18 235 lb 14.3 oz (107 kg)  01/27/18 236 lb 0.4 oz (107.1 kg)      Studies/Labs Reviewed:   EKG:  EKG atrial fib with RVR   166   ST dperesion nf the anterior/ anterolateral leads    Recent Labs: 11/11/2017: ALT 15; B Natriuretic Peptide 294.0 03/27/2018: BUN 10; Creatinine, Ser 0.72; Hemoglobin 15.0; Magnesium 1.9; Platelets 265; Potassium 4.0; Sodium 140   Lipid Panel    Component Value Date/Time   CHOL 160 10/13/2015 0948   TRIG 49 10/13/2015 0948   HDL 39 (L) 10/13/2015 0948   CHOLHDL 4.1 10/13/2015 0948   VLDL 10 10/13/2015 0948   LDLCALC 111 10/13/2015 0948   LDLDIRECT 162.4 08/10/2012 1143    Additional studies/ records that were reviewed today include:   As above   ASSESSMENT & PLAN:    1. Rhyhm   Pt with recurrent tachycardia   EKG here looks like Afib with RVR   In ED Fri regular, consistent with probable atrial flutter  I reviewed with pt   Would recomm Transport to [facility]   Repeat INR  Recomm DCCV if INR OK Admit after to tele to observe  Consult with EP re antiarrhythmic options  2  TGV s/p mustard procedure - Last echo 08/2016 technically difficult study.    Clinically he has done well and continueds to do well (when not drinking and on meds )  Pt does have appt with [facility] (in [place] ) on Wed 9/18 at 1 PM    3.  HTN -  well controlled on current regimen  4. Alcohol abuse - Off ETOH for 6 months        Medication Adjustments/Labs and Tests Ordered: Current medicines are reviewed at length with the patient today.  Concerns regarding medicines are outlined above.  Medication changes, Labs and Tests ordered today are listed in the Patient Instructions below. There are no Patient Instructions on file for this visit.   Signed, Dorris Carnes, MD  03/30/2018 4:00 PM    [address], [place], [place]  [address] Phone: [phone]; Fax: [phone]

## 2018-03-30 NOTE — H&P (Signed)
H and P Primary cardiol:   Harrington Challenger EP:   [facility]   [place]   Pt presents for eval of tahycardia  Matthew [name] is a 42 y.o. 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 He has been evaluated by EP (Dr. Lovena Le) in 2015 who had arranged for him to undergo radiofrequency ablation at [place] echo in Feb 2017  Difficult study   RVEF 35%   Mild systemic AV vlave regurg; mod nonsystemic AV valve regurg  Baffles not well seen  Admitted to [facility] 09/2017 for alcohol intoxication (relaps after many years).  Patient does not wish to have inpatient rehab program and  he wants to keep his job.  Coumadin was resumed   He was reported in [place] then self converted to SR  (he had been off meds for a month)   Seen by [facility] in April 2019  Doing good at that time  Some brief dyspnea withextreme lifting   No edema  No palpitations   I saw him in May   DOing OK  At time  HE says he hasa been doing good   Active   No SOB   Has 3 kids  Friday woke up   Feeling heart race   Felt bad   Went to [facility] ED   Atrial flutter   Rats 180   IV diltiazem started and by ED report pt converted    No EKG done    Saturday pt felt a little winded   Went to son's soccer game then took it easy the rest of the afternoon Sunday felt bad   Weak  Laid in bed most of day Today tired  COntinues to feel bad  Weak   Scared     Denies  SOB with activity   Called office   Here in clinic  Past Medical History:  Diagnosis Date  . Alcohol abuse   . Anxiety   . Atypical atrial flutter ([place])   . Complete transposition of great vessels   . Depression   . Dyslipidemia   . GERD (gastroesophageal reflux disease)   . Headache    "weekly" (09/24/2014)  . Heart murmur   . Hypertension   . 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 SVT (supraventricular tachycardia) ([place]) "several times in the  last year" (09/24/2014)  . 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 FINGER ARTHROPLASTY Right    volar plate arthroplasty, right small finger proxima linterphalangeal joint [Other]  . FRACTURE SURGERY    . SEPTOSTOMY     Rashkind balloon atrial septostomy   . TRANSPOSITION OF GREAT VESSELS REPAIR  1980   "mustard procedure"    Current Medications: Prior to Admission medications   Medication Sig Start Date End Date Taking? Authorizing Provider  acetaminophen (TYLENOL) 500 MG tablet Take 2 tablets (1,000 mg total) by mouth every 8 (eight) hours as needed for headache. 10/03/17 10/03/18  Kathie Dike, MD  escitalopram (LEXAPRO) 10 MG tablet Take 1 tablet (10 mg total) by mouth daily. 09/18/17   Mikey Kirschner, MD  hydrOXYzine (ATARAX/VISTARIL) 25 MG tablet Take 1 tablet (25 mg total) by mouth 3 (three) times daily as needed for anxiety. 10/03/17   Kathie Dike, MD  magnesium oxide (MAG-OX)  400 MG tablet Take 1 tablet (400 mg total) by mouth daily. 10/03/17   Kathie Dike, MD  metoprolol tartrate (LOPRESSOR) 25 MG tablet Take 50 mg (2 tabs) by mouth in AM and 25 mg (1 tab) by mouth in PM. 10/03/17   Kathie Dike, MD  ondansetron (ZOFRAN ODT) 4 MG disintegrating tablet Take 1 tablet (4 mg total) by mouth every 8 (eight) hours as needed for nausea or vomiting. 10/03/17   Kathie Dike, MD  pantoprazole (PROTONIX) 40 MG tablet Take 1 tablet (40 mg total) by mouth daily. 10/03/17   Kathie Dike, MD  warfarin (COUMADIN) 5 MG tablet Take 1/2 tablet to 1 tablet daily as directed by Coumadin clinic 10/28/17   Fay Records, MD    Allergies:   Aspirin   Social History   Socioeconomic History  . Marital status: Legally Separated    Spouse name: Not on file  . Number of children: Not on file  . Years of education: Not on file  . Highest education level: Not on file  Occupational History  . Not on file   Social Needs  . Financial resource strain: Not on file  . Food insecurity:    Worry: Not on file    Inability: Not on file  . Transportation needs:    Medical: Not on file    Non-medical: Not on file  Tobacco Use  . Smoking status: Never Smoker  . Smokeless tobacco: Current User    Types: Snuff  . Tobacco comment: "dipped when I played softball; none since 2011"  Substance and Sexual Activity  . Alcohol use: Yes    Alcohol/week: 60.0 standard drinks    Types: 60 Shots of liquor per week    Comment: 09/24/2014 "1/5th vodka in 2 days; easy"  . Drug use: No  . Sexual activity: Yes  Lifestyle  . Physical activity:    Days per week: Not on file    Minutes per session: Not on file  . Stress: Not on file  Relationships  . Social connections:    Talks on phone: Not on file    Gets together: Not on file    Attends religious service: Not on file    Active member of club or organization: Not on file    Attends meetings of clubs or organizations: Not on file    Relationship status: Not on file  Other Topics Concern  . Not on file  Social History Narrative  . Not on file     Family History:  The patient's family history includes Hypertension in his father; Rheumatic fever in his father.   ROS:   Please see the history of present illness.    ROS All other systems reviewed and are negative.   PHYSICAL EXAM:   VS:  Pulse (!) 166   Ht 5\' 10"  (1.778 m)   Wt 238 lb (108 kg)   BMI 34.15 kg/m     Unable to get BP    GEN: Overweight 42 yo in mld distress    HEENT: NCAT Neck: no JVD, carotid bruits, or masses Cardiac: RRR; no murmurs, rubs, or gallops,no edema   1_ pulses in feet  Respiratory:  clear to auscultation bilaterally, normal work of breathing GI: soft, nontender, nondistended, + BS MS: no deformity or atrophy  Neuro:  Alert and Oriented x 3,  Psych: euthymic mood, full affect  Wt Readings from Last 3 Encounters:  03/30/18 238 lb (108 kg)  03/27/18 235 lb 14.3  oz  (107 kg)  01/27/18 236 lb 0.4 oz (107.1 kg)      Studies/Labs Reviewed:   EKG:  EKG atrial fib with RVR   166   ST dperesion nf the anterior/ anterolateral leads    Recent Labs: 11/11/2017: ALT 15; B Natriuretic Peptide 294.0 03/27/2018: BUN 10; Creatinine, Ser 0.72; Hemoglobin 15.0; Magnesium 1.9; Platelets 265; Potassium 4.0; Sodium 140   Lipid Panel    Component Value Date/Time   CHOL 160 10/13/2015 0948   TRIG 49 10/13/2015 0948   HDL 39 (L) 10/13/2015 0948   CHOLHDL 4.1 10/13/2015 0948   VLDL 10 10/13/2015 0948   LDLCALC 111 10/13/2015 0948   LDLDIRECT 162.4 08/10/2012 1143    Additional studies/ records that were reviewed today include:   As above   ASSESSMENT & PLAN:    1. Rhyhm   Pt with recurrent tachycardia   EKG here looks like Afib with RVR   In ED Fri regular, consistent with probable atrial flutter  I reviewed with pt   Would recomm Transport to [facility]   Repeat INR  Recomm DCCV if INR OK Admit after to tele to observe  Consult with EP re antiarrhythmic options  2  TGV s/p mustard procedure - Last echo 08/2016 technically difficult study.    Clinically he has done well and continueds to do well (when not drinking and on meds )  Pt does have appt with [facility] (in [place] ) on Wed 9/18 at 1 PM    3.  HTN -  well controlled on current regimen  4. Alcohol abuse - Off ETOH for 6 months

## 2018-03-30 NOTE — Telephone Encounter (Signed)
New Message   Patient c/o Palpitations:  High priority if patient c/o lightheadedness, shortness of breath, or chest pain  1. How long have you had palpitations/irregular HR/ Afib? Are you having the symptoms now? Since Friday, yes  2. Are you currently experiencing lightheadedness, SOB or CP? No  3. Do you have a history of afib (atrial fibrillation) or irregular heart rhythm? yes  4. Have you checked your BP or HR? (document readings if available): HR 120  5. Are you experiencing any other symptoms? No   Patient states that he was at the emergency room Friday.

## 2018-03-30 NOTE — ED Notes (Signed)
Cardizem 10mg  giver per cardiologist.

## 2018-03-30 NOTE — ED Notes (Signed)
Cardizem turned off per Dr. Sallyanne Kuster

## 2018-03-30 NOTE — Progress Notes (Signed)
After intravenous diltiazem in the emergency room the patient feels better.  Rhythm is regular and fixed at 107 bpm, mimicking sinus tachycardia.  An additional bolus of intravenous diltiazem 10 mg caused variable AV block, ventricular rate 90, unmasking underlying atypical flutter with a rate of roughly 200 bpm.  Confirmed on twelve-lead ECG. Awaiting check of prothrombin time, after which we plan to proceed with sedation and electrical cardioversion. This procedure has been fully reviewed with the patient and written informed consent has been obtained.  Sanda Klein, MD, [facility] [phone] office [phone] pager

## 2018-03-30 NOTE — ED Triage Notes (Signed)
Pt to ED from Dr. Harrington Challenger (cardiologist) office with c/o SVT rate of 203.  Pt st's  He had a episode on Sunday but it went away.  Pt was at [facility] on 9/13 with same complaint

## 2018-03-30 NOTE — CV Procedure (Signed)
Procedure: Electrical Cardioversion Indications:  Atrial Flutter  Procedure Details:  Consent: Risks of procedure as well as the alternatives and risks of each were explained to the (patient/caregiver).  Consent for procedure obtained.  Time Out: Verified patient identification, verified procedure, site/side was marked, verified correct patient position, special equipment/implants available, medications/allergies/relevent history reviewed, required imaging and test results available.  Performed  Patient placed on cardiac monitor, pulse oximetry, supplemental oxygen as necessary.  Sedation given: Etomidate IV,  Dr. Wilson Singer Pacer pads placed anterior and posterior chest.  Cardioverted 1 time(s).  Cardioversion with synchronized biphasic 120J shock.  Evaluation: Findings: Post procedure EKG shows: NSR Complications: None Patient did tolerate procedure well.  Time Spent Directly with the Patient:  30 minutes   Matthew [name] 03/30/2018, 7:48 PM

## 2018-03-30 NOTE — Progress Notes (Signed)
ANTICOAGULATION CONSULT NOTE - Initial Consult  Pharmacy Consult for warfarin Indication: atrial fibrillation  Allergies  Allergen Reactions  . Aspirin Other (See Comments)    Cardiologist told pt not to take    Patient Measurements: Height: 5\' 10"  (177.8 cm) Weight: 237 lb 14 oz (107.9 kg) IBW/kg (Calculated) : 73  Vital Signs: Temp: 98.3 F (36.8 C) (09/16 1937) Temp Source: Oral (09/16 1937) BP: 101/74 (09/16 1937) Pulse Rate: 62 (09/16 1937)  Labs: Recent Labs    03/30/18 1706 03/30/18 1809  HGB 15.0  --   HCT 44.2  --   PLT 292  --   LABPROT  --  28.6*  INR  --  2.72  CREATININE 0.87  --     Estimated Creatinine Clearance: 140.3 mL/min (by C-G formula based on SCr of 0.87 mg/dL).   Medical History: Past Medical History:  Diagnosis Date  . Alcohol abuse   . Anxiety   . Atypical atrial flutter ([place])   . Complete transposition of great vessels   . Depression   . Dyslipidemia   . GERD (gastroesophageal reflux disease)   . Headache    "weekly" (09/24/2014)  . Heart murmur   . Hypertension   . 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 SVT (supraventricular tachycardia) ([place]) "several times in the last year" (09/24/2014)  . Transposition of great vessel    s/p Mustard procedure in 1980    Assessment: 41 yom presented to the ED with tachycardia. He is on chronic warfarin for afib and this will be continued. INR is therapeutic. CBC is WNL and no bleeding noted.   Goal of Therapy:  INR 2-3 Monitor platelets by anticoagulation protocol: Yes   Plan:  Warfarin 2.5mg  PO x 1 tonight  Daily INR  Elisabel Hanover, Rande Lawman 03/30/2018,7:51 PM

## 2018-03-30 NOTE — H&P (Signed)
Error

## 2018-03-31 ENCOUNTER — Other Ambulatory Visit: Payer: Self-pay

## 2018-03-31 ENCOUNTER — Encounter (HOSPITAL_COMMUNITY): Payer: Self-pay

## 2018-03-31 DIAGNOSIS — Q203 Discordant ventriculoarterial connection: Secondary | ICD-10-CM | POA: Diagnosis not present

## 2018-03-31 DIAGNOSIS — I484 Atypical atrial flutter: Secondary | ICD-10-CM | POA: Diagnosis not present

## 2018-03-31 LAB — BASIC METABOLIC PANEL
Anion gap: 11 (ref 5–15)
BUN: 9 mg/dL (ref 6–20)
CO2: 25 mmol/L (ref 22–32)
Calcium: 9 mg/dL (ref 8.9–10.3)
Chloride: 106 mmol/L (ref 98–111)
Creatinine, Ser: 0.71 mg/dL (ref 0.61–1.24)
GFR calc Af Amer: >60 mL/min (ref >60)
GFR calc non Af Amer: >60 mL/min (ref >60)
Glucose, Bld: 104 mg/dL — ABNORMAL HIGH (ref 70–99)
Potassium: 4.4 mmol/L (ref 3.5–5.1)
Sodium: 142 mmol/L (ref 135–145)

## 2018-03-31 LAB — CBC
HCT: 40.2 % (ref 39.0–52.0)
Hemoglobin: 13.4 g/dL (ref 13.0–17.0)
MCH: 29.1 pg (ref 26.0–34.0)
MCHC: 33.3 g/dL (ref 30.0–36.0)
MCV: 87.2 fL (ref 78.0–100.0)
Platelets: 277 10*3/uL (ref 150–400)
RBC: 4.61 MIL/uL (ref 4.22–5.81)
RDW: 13.5 % (ref 11.5–15.5)
WBC: 9.8 10*3/uL (ref 4.0–10.5)

## 2018-03-31 LAB — MRSA PCR SCREENING: MRSA by PCR: NEGATIVE

## 2018-03-31 LAB — PROTIME-INR
INR: 2.43
Prothrombin Time: 26.2 seconds — ABNORMAL HIGH (ref 11.4–15.2)

## 2018-03-31 MED ORDER — WARFARIN SODIUM 5 MG PO TABS: 5.0000 mg | ORAL_TABLET | Freq: Once | ORAL | Status: DC

## 2018-03-31 NOTE — Progress Notes (Signed)
Progress Note  Patient Name: Matthew [name] Date of Encounter: 03/31/2018  Primary Cardiologist: No primary care provider on file. [place]  Subjective   " I feel like an 42 year old man!"  Inpatient Medications    Scheduled Meds: . escitalopram  10 mg Oral Daily  . sodium chloride flush  3 mL Intravenous Q12H  . warfarin  5 mg Oral ONCE-1800  . Warfarin - Pharmacist Dosing Inpatient   Does not apply q1800   Continuous Infusions: . sodium chloride Stopped (03/30/18 2040)  . diltiazem (CARDIZEM) infusion Stopped (03/30/18 1938)   PRN Meds: acetaminophen, ondansetron (ZOFRAN) IV, sodium chloride flush   Vital Signs    Vitals:   03/31/18 0300 03/31/18 0417 03/31/18 0605 03/31/18 0816  BP:  (!) 86/60  99/68  Pulse: (!) 59 67 83 63  Resp: 15 15 (!) 23 20  Temp:  97.9 F (36.6 C)  98.1 F (36.7 C)  TempSrc:  Oral  Oral  SpO2: 93% 95% 95% 95%  Weight:   107.9 kg   Height:        Intake/Output Summary (Last 24 hours) at 03/31/2018 2951 Last data filed at 03/30/2018 2230 Gross per 24 hour  Intake 243 ml  Output -  Net 243 ml   Filed Weights   03/30/18 1645 03/30/18 2101 03/31/18 0605  Weight: 107.9 kg 108.5 kg 107.9 kg    Telemetry    Sinus rhythm, no recurrent atrial arrhythmia- Personally Reviewed  ECG    Sinus rhythm, right axis deviation, right ventricular hypertrophy with marked secondary repolarization changes- Personally Reviewed  Physical Exam  Looks very comfortable lying fully supine in bed GEN: No acute distress.   Neck: No JVD Cardiac: RRR, widely split second heart sound , no murmurs, rubs, or gallops.  Respiratory: Clear to auscultation bilaterally. GI: Soft, nontender, non-distended  MS: No edema; No deformity. Neuro:  Nonfocal  Psych: Normal affect   Labs    Chemistry Recent Labs  Lab 03/27/18 0754 03/30/18 1706 03/31/18 0234  NA 140 142 142  K 4.0 4.1 4.4  CL 105 107 106  CO2 27 24 25   GLUCOSE 129* 128* 104*  BUN 10 8 9     CREATININE 0.72 0.87 0.71  CALCIUM 9.0 9.5 9.0  GFRNONAA >60 >60 >60  GFRAA >60 >60 >60  ANIONGAP 8 11 11      Hematology Recent Labs  Lab 03/27/18 0754 03/30/18 1706 03/31/18 0234  WBC 9.8 8.2 9.8  RBC 5.15 5.18 4.61  HGB 15.0 15.0 13.4  HCT 44.6 44.2 40.2  MCV 86.6 85.3 87.2  MCH 29.1 29.0 29.1  MCHC 33.6 33.9 33.3  RDW 13.5 13.2 13.5  PLT 265 292 277    Cardiac EnzymesNo results for input(s): TROPONINI in the last 168 hours. No results for input(s): TROPIPOC in the last 168 hours.   BNPNo results for input(s): BNP, PROBNP in the last 168 hours.   DDimer No results for input(s): DDIMER in the last 168 hours.   Radiology    No results found.  Cardiac Studies   Synchronized cardioversion single 120 J shock unsuccessful.  Patient Profile     42 y.o. male with history of congenital TGA status post Mustard procedure 1980, admitted with a protracted episode of recurrent atypical atrial flutter, requiring urgent cardioversion in the emergency room last night.  Assessment & Plan    Successfully maintaining sinus rhythm.  Substantial symptomatic improvement.  Therapeutically anticoagulated.  Has an appointment to see Dr. Corine Shelter at  [facility] tomorrow at 1 PM. No antiarrhythmics were added on this admission. In sinus rhythm, his rate is in the high 50s.  Continue metoprolol 50 mg every morning and 25 mg at night, further medication adjustment after he is evaluated at [facility]. Abstinent from alcohol for 6 months.  For questions or updates, please contact [place] Please consult [email or web address] for contact info under        Signed, Sanda Klein, MD  03/31/2018, 9:27 AM

## 2018-03-31 NOTE — Discharge Summary (Addendum)
Discharge Summary    Patient ID: Matthew [name] MRN: [id number]; DOB: 1975/09/24  Admit date: 03/30/2018 Discharge date: 03/31/2018  Primary Care Provider: Mikey Kirschner, MD  Primary Cardiologist: Dorris Carnes, MD  Primary Electrophysiologist:  None  Discharge Diagnoses    Active Problems:   Atrial flutter ([place])   Allergies Allergies  Allergen Reactions  . Aspirin Other (See Comments)    Cardiologist told pt not to take    Diagnostic Studies/Procedures    Procedure: Electrical Cardioversion  03/30/18 Indications:  Atrial Flutter  Procedure Details:  Consent: Risks of procedure as well as the alternatives and risks of each were explained to the (patient/caregiver).  Consent for procedure obtained.  Time Out: Verified patient identification, verified procedure, site/side was marked, verified correct patient position, special equipment/implants available, medications/allergies/relevent history reviewed, required imaging and test results available.  Performed  Patient placed on cardiac monitor, pulse oximetry, supplemental oxygen as necessary.  Sedation given: Etomidate IV,  Dr. Wilson Singer Pacer pads placed anterior and posterior chest.  Cardioverted 1 time(s).  Cardioversion with synchronized biphasic 120J shock.  Evaluation: Findings: Post procedure EKG shows: NSR Complications: None Patient did tolerate procedure well. _____________   History of Present Illness     Matthew [name] is a 43 y.o. male malewith a history of transposition of great vessels(s/p Mustard procedure in 1980), atypical atrial flutter (reported recurrence in march) on warfarin, HTN and alcohol abuse/DTs presents for follow up. He has been evaluated by EP (Dr. Jearl Klinefelter [phone] had arranged for him to undergo radiofrequency ablation at [facility]. He has an appointment to see Dr. Lawanda Cousins at [facility].   On Friday he woke up feeling his heart race   He felt bad and went to [facility] ED.  He was found to be in Atrial flutter with Rates of 180 bpm.  IV diltiazem was started and by ED report pt converted but no EKG was done.  On Saturday pt felt a little winded   He went to his son's soccer game then took it easy the rest of the afternoon. On Sunday he felt bad again with weakness. He laid in bed most of the day. On Monday 9/16 he felt tired, weak and scared. He called our office and was brought in. EKG showed afib with RVR, 166 bpm. He was sent for admission to [facility].   He was noted to be off alcohol for the last 6 months.    Hospital Course     Consultants: none  Matthew [name] was given IV diltiazem in the ED with improvement in his symptoms.  The rhythm was regular and fixed at 107 bpm, mimicking sinus tachycardia.  An additional bolus of intravenous diltiazem 10 mg caused variable AV block, ventricular rate 90, unmasking underlying Atypical flutter with a rate of roughly 200 bpm.  Confirmed on EKG.  He is anticoagulated with warfarin and therapeutic INR.  Electrical cardioversion was performed in the ED.  Postprocedure EKG showed normal sinus rhythm.   He was observed overnight. Successfully maintaining sinus rhythm.  Substantial symptomatic improvement.  Therapeutically anticoagulated.  Has an appointment to see Dr. Corine Shelter at [facility] tomorrow at 1 PM. No antiarrhythmics were added on this admission. In sinus rhythm, his rate is in the high 50s.  Continue metoprolol 50 mg every morning and 25 mg at night, further medication adjustment after he is evaluated at [facility]. Abstinent from alcohol for 6 months, advised to continue.    Patient has been seen by  Dr. Sallyanne Kuster today and deemed ready for discharge home. All follow up appointments have been scheduled. Discharge medications are listed below. _____________  Discharge Vitals Blood pressure 99/68, pulse 63, temperature 98.1 F (36.7 C), temperature source Oral, resp. rate 20, height 6\' 2"  (1.88 m),  weight 107.9 kg, SpO2 95 %.  Filed Weights   03/30/18 1645 03/30/18 2101 03/31/18 0605  Weight: 107.9 kg 108.5 kg 107.9 kg    Labs & Radiologic Studies    CBC Recent Labs    03/30/18 1706 03/31/18 0234  WBC 8.2 9.8  NEUTROABS 5.4  --   HGB 15.0 13.4  HCT 44.2 40.2  MCV 85.3 87.2  PLT 292 314   Basic Metabolic Panel Recent Labs    03/30/18 1706 03/31/18 0234  NA 142 142  K 4.1 4.4  CL 107 106  CO2 24 25  GLUCOSE 128* 104*  BUN 8 9  CREATININE 0.87 0.71  CALCIUM 9.5 9.0  MG 1.7  --    Liver Function Tests No results for input(s): AST, ALT, ALKPHOS, BILITOT, PROT, ALBUMIN in the last 72 hours. No results for input(s): LIPASE, AMYLASE in the last 72 hours. Cardiac Enzymes No results for input(s): CKTOTAL, CKMB, CKMBINDEX, TROPONINI in the last 72 hours. BNP Invalid input(s): POCBNP D-Dimer No results for input(s): DDIMER in the last 72 hours. Hemoglobin A1C No results for input(s): HGBA1C in the last 72 hours. Fasting Lipid Panel No results for input(s): CHOL, HDL, LDLCALC, TRIG, CHOLHDL, LDLDIRECT in the last 72 hours. Thyroid Function Tests No results for input(s): TSH, T4TOTAL, T3FREE, THYROIDAB in the last 72 hours.  Invalid input(s): FREET3 _____________  No results found. Disposition   Pt is being discharged home today in good condition.  Follow-up Plans & Appointments    Follow-up Information    Fay Records, MD Follow up.   Specialty:  Cardiology Why:  Our office will call you to schedule a cardilogy follow up. Please keep your appointment at [facility].  Contact information: [place] [address] [phone]          Discharge Instructions    Diet - low sodium heart healthy   Complete by:  As directed    Increase activity slowly   Complete by:  As directed       Discharge Medications   Allergies as of 03/31/2018      Reactions   Aspirin Other (See Comments)   Cardiologist told pt not to take        Medication List    TAKE these medications   acetaminophen 500 MG tablet Commonly known as:  TYLENOL Take 1,000 mg by mouth every 6 (six) hours as needed for headache (pain).   escitalopram 10 MG tablet Commonly known as:  LEXAPRO TAKE 1 TABLET BY MOUTH ONCE DAILY   hydrOXYzine 25 MG tablet Commonly known as:  ATARAX/VISTARIL Take 1 tablet (25 mg total) by mouth 3 (three) times daily as needed. What changed:  when to take this   losartan 50 MG tablet Commonly known as:  COZAAR Take 1 tablet (50 mg total) by mouth daily.   metoprolol tartrate 25 MG tablet Commonly known as:  LOPRESSOR Take 50 mg (2 tabs) by mouth in AM and 25 mg (1 tab) by mouth in PM. What changed:    how much to take  how to take this  when to take this  additional instructions   multivitamin with minerals Tabs tablet Take 1 tablet  by mouth daily.   sodium chloride 0.65 % Soln nasal spray Commonly known as:  OCEAN Place 1 spray into both nostrils daily as needed for congestion.   warfarin 5 MG tablet Commonly known as:  COUMADIN Take as directed. If you are unsure how to take this medication, talk to your nurse or doctor. Original instructions:  TAKE 1/2 TO 1 (ONE-HALF TO ONE) TABLET BY MOUTH DAILY AS DIRECTED BY  COUMADIN  CLINIC What changed:    how much to take  how to take this  when to take this  additional instructions        Acute coronary syndrome (MI, NSTEMI, STEMI, etc) this admission?: No.    Outstanding Labs/Studies     Duration of Discharge Encounter   Greater than 30 minutes including physician time.  Signed, Daune Perch, NP 03/31/2018, 11:37 AM

## 2018-03-31 NOTE — Progress Notes (Signed)
Case manager contacted regarding "condition code 68," and she advised that patient is clear for discharge.

## 2018-03-31 NOTE — Progress Notes (Signed)
ANTICOAGULATION CONSULT NOTE - [place] for warfarin Indication: atrial fibrillation  Allergies  Allergen Reactions  . Aspirin Other (See Comments)    Cardiologist told pt not to take    Patient Measurements: Height: 6\' 2"  (188 cm) Weight: 237 lb 14 oz (107.9 kg) IBW/kg (Calculated) : 82.2  Vital Signs: Temp: 97.9 F (36.6 C) (09/17 0417) Temp Source: Oral (09/17 0417) BP: 86/60 (09/17 0417) Pulse Rate: 83 (09/17 0605)  Labs: Recent Labs    03/30/18 1706 03/30/18 1809 03/31/18 0234  HGB 15.0  --  13.4  HCT 44.2  --  40.2  PLT 292  --  277  LABPROT  --  28.6* 26.2*  INR  --  2.72 2.43  CREATININE 0.87  --  0.71    Estimated Creatinine Clearance: 162.2 mL/min (by C-G formula based on SCr of 0.71 mg/dL).   Medical History: Past Medical History:  Diagnosis Date  . Alcohol abuse   . Anxiety   . Atypical atrial flutter ([place])   . Complete transposition of great vessels   . Depression   . Dyslipidemia   . GERD (gastroesophageal reflux disease)   . Headache    "weekly" (09/24/2014)  . Heart murmur   . Hypertension   . 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 SVT (supraventricular tachycardia) ([place]) "several times in the last year" (09/24/2014)  . 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 Pt is on warfarin PTA for AFib, INR therapeutic on admit and remains at goal today at 2.43, CBC stable, maintaining NSR with controlled HR.  *PTA Warfarin Dose = 2.5mg  Mon/Wed, 5mg  all other days  Goal of Therapy:  INR 2-3 Monitor platelets by anticoagulation protocol: Yes   Plan:  Warfarin 5mg  PO x 1 tonight  Daily INR  Arrie Senate, PharmD, BCPS Clinical Pharmacist [phone] Please check AMION for all [place] numbers 03/31/2018

## 2018-03-31 NOTE — H&P (Signed)
Fay Records, MD  Physician  Cardiology  H&P  Signed  Date of Service:  03/30/2018 4:34 PM   See above note

## 2018-03-31 NOTE — Plan of Care (Signed)

## 2018-03-31 NOTE — Discharge Instructions (Signed)
Atrial Flutter °Atrial flutter is a type of abnormal heart rhythm (arrhythmia). In atrial flutter, the heartbeat is fast but regular. There are two types of atrial flutter: °· Paroxysmal atrial flutter. This type starts suddenly. It usually stops on its own soon after it starts. °· Permanent atrial flutter. This type does not go away. ° °What are the causes? °This condition may be caused by: °· A heart condition or problem, such as: °? A heart attack. °? Heart failure. °? A heart valve problem. °· A lung problem, such as: °? A blood clot in the lungs (pulmonary embolism, or PE). °? Chronic obstructive pulmonary disease. °· Poorly controlled high blood pressure (hypertension). °· Hyperthyroidism. °· Caffeine. °· Some decongestant cold medicines. °· Low levels of minerals called electrolytes in the blood. °· Cocaine. ° °What increases the risk? °This condition is more likely to develop in: °· Elderly adults. °· Men. ° °What are the signs or symptoms? °Symptoms of this condition include: °· A feeling that your heart is pounding or racing (palpitations). °· Shortness of breath. °· Chest pain. °· Feeling light-headed. °· Dizziness. °· Fainting. ° °How is this diagnosed? °This condition may be diagnosed with tests, including: °· An electrocardiogram (ECG). This is a painless test that records electrical signals in the heart. °· Holter monitoring. For this test, you wear a device that records your heartbeat for 1-2 days. °· Cardiac event monitoring. For this test, you wear a device that records your heartbeat for up to 30 days. °· An echocardiogram. This is a painless test that uses sound waves to make a picture of your heart. °· Stress test. This test records your heartbeat while you exercise. °· Blood tests. ° °How is this treated? °This condition may be treated with: °· Treatment of any underlying conditions. °· Medicine to make your heart beat more slowly. °· Medicine to keep the condition from coming back. °· A  procedure to keep the condition under control. Some procedures to do this include: °? Cardioversion. During this procedure, medicines or an electrical shock are given to make the heart beat normally. °? Ablation. During this procedure, the heart tissue that is causing the problem is destroyed. This procedure may be done if atrial flutter lasts a long time or happens often. ° °Follow these instructions at home: °· Take over-the-counter and prescription medicines only as told by your health care provider. °· Do not take any new medicines without talking to your health care provider. °· Do not use tobacco products, including cigarettes, chewing tobacco, or e-cigarettes. If you need help quitting, ask your health care provider. °· Limit alcohol intake to no more than 1 drink per day for nonpregnant women and 2 drinks per day for men. One drink equals 12 oz of beer, 5 oz of wine, or 1½ oz of hard liquor. °· Try to reduce any stress. Stress can make your symptoms worse. °Contact a health care provider if: °· Your symptoms get worse. °Get help right away if: °· You are dizzy. °· You feel like fainting or you faint. °· You have shortness of breath. °· You feel pain or pressure in your chest. °· You suddenly feel nauseous or you suddenly vomit. °· There is a sudden change in your ability to speak, eat, or move. °· You are sweating a lot for no reason. °This information is not intended to replace advice given to you by your health care provider. Make sure you discuss any questions you have with your health care   provider. Document Released: 11/17/2008 Document Revised: 11/08/2015 Document Reviewed: 01/13/2015 Elsevier Interactive Patient Education  2018 [employer].   Hospital doctor cardioversion is the delivery of a jolt of electricity to restore a normal rhythm to the heart. A rhythm that is too fast or is not regular keeps the heart from pumping well. In this procedure, sticky patches or metal  paddles are placed on the chest to deliver electricity to the heart from a device. This procedure may be done in an emergency if:  There is low or no blood pressure as a result of the heart rhythm.  Normal rhythm must be restored as fast as possible to protect the brain and heart from further damage.  It may save a life.  This procedure may also be done for irregular or fast heart rhythms that are not immediately life-threatening. Tell a health care provider about:  Any allergies you have.  All medicines you are taking, including vitamins, herbs, eye drops, creams, and over-the-counter medicines.  Any problems you or family members have had with anesthetic medicines.  Any blood disorders you have.  Any surgeries you have had.  Any medical conditions you have.  Whether you are pregnant or may be pregnant. What are the risks? Generally, this is a safe procedure. However, problems may occur, including:  Allergic reactions to medicines.  A blood clot that breaks free and travels to other parts of your body.  The possible return of an abnormal heart rhythm within hours or days after the procedure.  Your heart stopping (cardiac arrest). This is rare.  What happens before the procedure? Medicines  Your health care provider may have you start taking: ? Blood-thinning medicines (anticoagulants) so your blood does not clot as easily. ? Medicines may be given to help stabilize your heart rate and rhythm.  Ask your health care provider about changing or stopping your regular medicines. This is especially important if you are taking diabetes medicines or blood thinners. General instructions  Plan to have someone take you home from the hospital or clinic.  If you will be going home right after the procedure, plan to have someone with you for 24 hours.  Follow instructions from your health care provider about eating or drinking restrictions. What happens during the  procedure?  To lower your risk of infection: ? Your health care team will wash or sanitize their hands. ? Your skin will be washed with soap.  An IV tube will be inserted into one of your veins.  You will be given a medicine to help you relax (sedative).  Sticky patches (electrodes) or metal paddles may be placed on your chest.  An electrical shock will be delivered. The procedure may vary among health care providers and hospitals. What happens after the procedure?  Your blood pressure, heart rate, breathing rate, and blood oxygen level will be monitored until the medicines you were given have worn off.  Do not drive for 24 hours if you were given a sedative.  Your heart rhythm will be watched to make sure it does not change. This information is not intended to replace advice given to you by your health care provider. Make sure you discuss any questions you have with your health care provider. Document Released: 06/21/2002 Document Revised: 02/28/2016 Document Reviewed: 01/05/2016 Elsevier Interactive Patient Education  2017 [employer]   Preventing Atrial Fibrillation-Related Stroke Atrial fibrillation is a common type of irregular or rapid heartbeat (arrhythmia) that greatly increases your risk for a  stroke. In atrial fibrillation, the top portions of the heart (atria) beat out of sync with the lower portions of the heart. When the muscles of the atria are tightening in an uncoordinated way (fibrillating), blood can pool in the heart and form clots. If a clot travels to the brain, it can cause a stroke. This type of stroke is preventable. Understanding atrial fibrillation and knowing how to properly manage it can prevent you from having a stroke. What increases my risk for a stroke? If you have atrial fibrillation, you may be at increased risk for a stroke if you also:  Have heart failure.  Have high blood pressure.  Are older than age 60.  Have diabetes.  Have a history  of vascular disease, such as heart attack or stroke.  Are male.  If you have atrial fibrillation and you also have one or more of those risk factors, talk with your health care provider about treatments that can prevent a stroke. Other risk factors for a stroke include:  Smoking.  High cholesterol.  Diabetes.  Being inactive (sedentary lifestyle).  Having a family history of stroke.  Eating a diet that is high in fat, cholesterol, and salt.  What treatments help to manage atrial fibrillation? The main goals of treatment for atrial fibrillation are to prevent blood clots from forming and to keep your heart beating at a normal rate and rhythm. Treatment may include:  Blood-thinning medicine (anticoagulant) that helps to prevent clots from forming. This medicine also increases the risk of bleeding. Talk with your health care provider about the risks and benefits of taking anticoagulants.  Medicine that slows the heart rate or brings the heart rhythm back to normal.  Electrical cardioversion. This is a procedure that resets the heart's rhythm by delivering a controlled, low-energy shock through your skin to your heart.  An ablation procedure, such as catheter ablation, catheter ablation with pacemaker, or surgical ablation. These procedures destroy the heart tissues that send abnormal signals so that heart rhythms can be improved or made normal. A pacemaker is a device that is placed under the skin to help the heart beat in a regular rhythm.  How can I prevent atrial fibrillation-related stroke? Medicines  Take over-the-counter and prescription medicines only as told by your health care provider.  If your health care provider prescribed an anticoagulant, take it exactly as told. Taking too much blood-thinning medicine can cause bleeding. If you do not take enough blood-thinning medicine, you will not have the protection that you need against stroke and other problems. Eating and  drinking  Eat healthy foods, including at least 5 servings of fruits and vegetables a day.  Do not drink alcohol.  Do not drink beverages that contain caffeine, such as coffee, soda, and tea.  Follow dietary instructions as told by your health care provider. Managing other medical conditions  Manage and be aware of your blood pressure. If you have high blood pressure, follow your treatment plan to keep it in your target range.  Have your cholesterol checked as often as recommended by your health care provider. If you have high cholesterol, follow your treatment plan to lower it and keep it in your target range.  Talk with your health care provider about symptoms to watch for. Some people may not have any symptoms, so it can be hard to know that they have atrial fibrillation. Talk with your health care provider if you experience: ? A feeling that your heart is beating rapidly or  irregularly. ? An irregular pulse. ? A feeling of discomfort or pain in your chest. ? Shortness of breath. ? Sudden light-headedness or weakness. ? Tiredness (fatigue) that happens easily during exercise.  If you have obstructive sleep apnea (OSA), manage your condition as told by your health care provider. General instructions  Maintain a healthy weight. Do not use diet pills unless your health care provider approves. Diet pills may make heart problems worse.  Exercise regularly. Get at least 30 minutes of activity on most or all days, or as told by your health care provider.  Do not use any products that contain nicotine or tobacco, such as cigarettes and e-cigarettes. If you need help quitting, ask your health care provider.  Do not use drugs, such as cocaine and amphetamines.  Keep all follow-up visits as told by your health care providers. This is important. These include visits with your heart specialist. Where to find more information: You may find more information about preventing atrial  fibrillation-related stroke from:  National Stroke Association (AFib-Stroke Connection): www.stroke.org  Contact a health care provider if:  You notice a change in the rate, rhythm, or strength of your heartbeat.  You have dizziness.  You are taking an anticoagulant and you have more bruises than usual.  You tire out more easily when you exercise or do similar activities. Get help right away if:  You have chest pain.  You have pain in your abdomen.  You experience unusual sweating or weakness.  You take anticoagulants and you: ? Have severe headaches or confusion. ? Have blood in your vomit, bowel movement, or urine. ? Have bleeding that will not stop. ? Fall or injure your head.  You have any symptoms of a stroke. "BE FAST" is an easy way to remember the main warning signs of a stroke: ? B - Balance. Signs are dizziness, trouble walking, or loss of balance. ? E - Eyes. Signs are trouble seeing or a sudden change in vision. ? F - Face. Signs are sudden weakness or numbness of the face, or the face or eyelid drooping on one side. ? A - Arms. Signs are weakness or numbness in an arm. This happens suddenly and usually on one side of the body. ? S - Speech. Signs are sudden trouble speaking, slurred speech, or trouble understanding what people say. ? T - Time. Time to call emergency services. Write down what time symptoms started.  You have other signs of a stroke, such as: ? A sudden, severe headache with no known cause. ? Nausea or vomiting. ? Seizure. These symptoms may represent a serious problem that is an emergency. Do not wait to see if the symptoms will go away. Get medical help right away. Call your local emergency services (911 in the [place] Do not drive yourself to the hospital. Summary  Having atrial fibrillation increases the risk for a stroke. Talk with your health care provider about what symptoms to watch for.  Atrial fibrillation-related stroke is preventable.  Proper management of atrial fibrillation can prevent you from having a stroke.  Talk with your health care provider about whether anticoagulant medicine is right for you.  Learn the warning signs of a stroke and remember "BE FAST." This information is not intended to replace advice given to you by your health care provider. Make sure you discuss any questions you have with your health care provider. Document Released: 10/16/2016 Document Revised: 10/16/2016 Document Reviewed: 10/16/2016 Elsevier Interactive Patient Education  2018 [employer].

## 2018-03-31 NOTE — Progress Notes (Signed)
Verbal and written discharge instructions given to patient. All questions answered. PIV removed per discharge protocol. Patient transported by CNA via wheelchair to his friend's waiting car for discharge.

## 2018-04-05 DIAGNOSIS — I420 Dilated cardiomyopathy: Secondary | ICD-10-CM | POA: Insufficient documentation

## 2018-04-07 ENCOUNTER — Encounter: Payer: Self-pay | Admitting: Physician Assistant

## 2018-04-21 ENCOUNTER — Ambulatory Visit: Payer: Self-pay | Admitting: Physician Assistant

## 2018-05-01 ENCOUNTER — Ambulatory Visit (INDEPENDENT_AMBULATORY_CARE_PROVIDER_SITE_OTHER): Payer: BLUE CROSS/BLUE SHIELD | Admitting: *Deleted

## 2018-05-01 DIAGNOSIS — Z5181 Encounter for therapeutic drug level monitoring: Secondary | ICD-10-CM

## 2018-05-01 DIAGNOSIS — I4892 Unspecified atrial flutter: Secondary | ICD-10-CM

## 2018-05-01 LAB — POCT INR: INR: 1.9 — AB (ref 2.0–3.0)

## 2018-05-01 NOTE — Patient Instructions (Signed)
Description   Today take 1.5 tablets then continue taking the same dosage 1 tablet daily except 1/2 tablet on Monday and Wednesday.  Recheck INR in 5 weeks. Call with any questions or new medications  [phone]

## 2018-05-12 ENCOUNTER — Ambulatory Visit: Payer: Self-pay | Admitting: Physician Assistant

## 2018-05-12 DIAGNOSIS — R0989 Other specified symptoms and signs involving the circulatory and respiratory systems: Secondary | ICD-10-CM

## 2018-05-12 NOTE — Progress Notes (Deleted)
Cardiology Office Note:    Date:  05/12/2018   ID:  Matthew [name], DOB 08-Sep-1975, MRN [id number]  PCP:  Mikey Kirschner, MD  Cardiologist:  Dorris Carnes, MD *** Electrophysiologist:  None   Referring MD: Mikey Kirschner, MD   No chief complaint on file. ***  History of Present Illness:    Matthew [name] is a 42 y.o. male with transposition of the great vessels s/p Mustard procedure, atrial flutter, hypertension, prior alcohol abuse.  He was admitted from the office in 9/19 with recurrent, symptomatic, atrial flutter with RVR.  He underwent DCCV with return of NSR.  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 Corine Shelter on 04/01/18.  His beta-blocker was adjusted.  He has undergone a cardiac MRI and a cardiopulmonary stress test is pending.  The notes indicate that he may be considered for Dofetilide load if he has recurrent atrial arrhythmias.  ***  [name] ***  Prior CV studies:   The following studies were reviewed today:  Cardiac MRI 04/29/18 ([facility]) -Complete transposition of the great arteries s/p Mustard operation (atrial switch) -Moderate tricuspid valve regurgitation -Mild narrowing of the superior baffle (1.6 x 0.95 cm) without any significant gradient. -Posterior shelf noted at aortic isthmus. Normal aortic pulsatility distal in the thoracic aorta -Moderate to severe right ventricular hypertrophy with normal systolic function  -RVEF 61; LVEF 62  Congenital Echo 04/02/18 ([facility]) Cardiac Position  Levocardia. Atrial situs solitus.   Veins  IVC connection to RA, with normal size and normal respiratory  collapse   Atria  s/p mustard procedure; pulmonary venous baffle is patent.   Atrio-ventricular Valves  Normal TV with very trivial TR, insufficient TR for estimated RVSP.  Mild to moderate MR   Ventricles  Moderately enlarged RV which is the systemic ventricle. The RV  function is severely globally  reduced ( EF 30%).  Normal LV size which is the pulmonary ventricle. The LV function is  mildly reduced ( EF 40-45%)   Semi Lunar Valves  No AS or PS. Both valves are tri-leaflet, Mild AR; No PR.   Coronary Arteries  Not evaluated.   Great Arteries  Paralleled great arteries, anterior is Aorta, emanating from the RV  (systemic ventricle).  The pulmonary artery is of normal size and is connected to the LV.   No prior study for comparison  Event monitor 10/19/15 Sinus rhythm with occasional PACs (did sense some) Couple short bursts of PAT  Self limited  Sensed   Echo 08/21/15 S/p Mustard procedure with atrial baffle in the d transposition, systemic ventricle (RV) diff HK with EF 35, mild systemic AV valve regurgitation, mod non systemic AV valve regurgitation  Past Medical History:  Diagnosis Date  . Alcohol abuse   . Anxiety   . Atypical atrial flutter ([place])   . Complete transposition of great vessels   . Depression   . Dyslipidemia   . GERD (gastroesophageal reflux disease)   . Headache    "weekly" (09/24/2014)  . Heart murmur   . Hypertension   . 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 SVT (supraventricular tachycardia) ([place]) "several times in the last year" (09/24/2014)  . 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   Current Medications: No outpatient medications have been marked  as taking for the 05/12/18 encounter (Appointment) with Richardson Dopp T, PA-C.     Allergies:   Aspirin   Social History   Tobacco Use  . Smoking status: Never Smoker  . Smokeless tobacco: Current User    Types: Snuff  . Tobacco comment: "dipped when I played softball; none since 2011"  Substance Use Topics  . Alcohol use: Yes    Alcohol/week: 60.0 standard drinks    Types: 60  Shots of liquor per week    Comment: 09/24/2014 "1/5th vodka in 2 days; easy"  . Drug use: No     Family Hx: The patient's family history includes Hypertension in his father; Rheumatic fever in his father.  ROS:   Please see the history of present illness.    ROS All other systems reviewed and are negative.   EKGs/Labs/Other Test Reviewed:    EKG:  EKG is *** ordered today.  The ekg ordered today demonstrates ***  Recent Labs: 11/11/2017: ALT 15; B Natriuretic Peptide 294.0 03/30/2018: Magnesium 1.7 03/31/2018: BUN 9; Creatinine, Ser 0.71; Hemoglobin 13.4; Platelets 277; Potassium 4.4; Sodium 142   Recent Lipid Panel Lab Results  Component Value Date/Time   CHOL 160 10/13/2015 09:48 AM   TRIG 49 10/13/2015 09:48 AM   HDL 39 (L) 10/13/2015 09:48 AM   CHOLHDL 4.1 10/13/2015 09:48 AM   LDLCALC 111 10/13/2015 09:48 AM   LDLDIRECT 162.4 08/10/2012 11:43 AM    Physical Exam:    VS:  There were no vitals taken for this visit.    Wt Readings from Last 3 Encounters:  03/31/18 237 lb 14 oz (107.9 kg)  03/30/18 238 lb (108 kg)  03/27/18 235 lb 14.3 oz (107 kg)     ***Physical Exam  ASSESSMENT & PLAN:    Atypical atrial flutter (HCC)  Complete transposition of great vessels  Essential hypertension***  Dispo:  No follow-ups on file.   Medication Adjustments/Labs and Tests Ordered: Current medicines are reviewed at length with the patient today.  Concerns regarding medicines are outlined above.  Tests Ordered: No orders of the defined types were placed in this encounter.  Medication Changes: No orders of the defined types were placed in this encounter.   Signed, Richardson Dopp, PA-C  05/12/2018 3:43 PM    [facility] [place], [place], [place]  [address] Phone: [phone]; Fax: [phone]

## 2018-06-05 ENCOUNTER — Ambulatory Visit (INDEPENDENT_AMBULATORY_CARE_PROVIDER_SITE_OTHER): Payer: Self-pay | Admitting: *Deleted

## 2018-06-05 DIAGNOSIS — Z5181 Encounter for therapeutic drug level monitoring: Secondary | ICD-10-CM

## 2018-06-05 DIAGNOSIS — I4892 Unspecified atrial flutter: Secondary | ICD-10-CM

## 2018-06-05 LAB — POCT INR: INR: 2.3 (ref 2.0–3.0)

## 2018-06-05 NOTE — Patient Instructions (Signed)
Description   Continue taking the same dosage 1 tablet daily except 1/2 tablet on Monday and Wednesday.  Recheck INR in 6 weeks. Call with any questions or new medications  [phone]

## 2018-07-04 ENCOUNTER — Other Ambulatory Visit: Payer: Self-pay | Admitting: Family Medicine

## 2018-07-17 ENCOUNTER — Ambulatory Visit (INDEPENDENT_AMBULATORY_CARE_PROVIDER_SITE_OTHER): Payer: BLUE CROSS/BLUE SHIELD | Admitting: *Deleted

## 2018-07-17 DIAGNOSIS — Z5181 Encounter for therapeutic drug level monitoring: Secondary | ICD-10-CM | POA: Diagnosis not present

## 2018-07-17 DIAGNOSIS — I4892 Unspecified atrial flutter: Secondary | ICD-10-CM

## 2018-07-17 LAB — POCT INR: INR: 2.1 (ref 2.0–3.0)

## 2018-07-17 NOTE — Patient Instructions (Signed)
Description   Continue taking the same dosage 1 tablet daily except 1/2 tablet on Monday and Wednesday.  Recheck INR in 6 weeks. Call with any questions or new medications  [phone]

## 2018-08-28 ENCOUNTER — Telehealth: Payer: Self-pay | Admitting: Family Medicine

## 2018-08-28 ENCOUNTER — Encounter (INDEPENDENT_AMBULATORY_CARE_PROVIDER_SITE_OTHER): Payer: Self-pay

## 2018-08-28 ENCOUNTER — Ambulatory Visit (INDEPENDENT_AMBULATORY_CARE_PROVIDER_SITE_OTHER): Payer: BLUE CROSS/BLUE SHIELD | Admitting: Pharmacist

## 2018-08-28 ENCOUNTER — Other Ambulatory Visit: Payer: Self-pay | Admitting: Family Medicine

## 2018-08-28 DIAGNOSIS — I4892 Unspecified atrial flutter: Secondary | ICD-10-CM | POA: Diagnosis not present

## 2018-08-28 DIAGNOSIS — Z5181 Encounter for therapeutic drug level monitoring: Secondary | ICD-10-CM

## 2018-08-28 LAB — POCT INR: INR: 1.5 — AB (ref 2.0–3.0)

## 2018-08-28 MED ORDER — OSELTAMIVIR PHOSPHATE 75 MG PO CAPS: ORAL_CAPSULE | ORAL | 0 refills | Status: DC

## 2018-08-28 NOTE — Patient Instructions (Signed)
Description   Take 1.5 tablets today and tomorrow, then continue taking the same dosage 1 tablet daily except 1/2 tablet on Monday and Wednesday.  Recheck INR in 2 weeks. Call with any questions or new medications  [phone]

## 2018-08-28 NOTE — Telephone Encounter (Signed)
Pt's son was seen this morning by Dr. Nicki Reaper and told if anyone in the family is showing symptoms to call the office and something could be called in.   Matthew [name] is achy, fever, chills, raw feeling in throat and cough.   [place], [place] - [address]

## 2018-08-28 NOTE — Telephone Encounter (Signed)
Tamiflu sent in to pharmacy and pt is aware.

## 2018-08-28 NOTE — Telephone Encounter (Signed)
Please advise. Thank you

## 2018-08-28 NOTE — Telephone Encounter (Signed)
Tamiflu 75 mg 1 twice daily for 5 days If severe shortness of breath or other severe symptoms of the flu we recommended to be seen

## 2018-09-08 ENCOUNTER — Other Ambulatory Visit: Payer: Self-pay | Admitting: Internal Medicine

## 2018-09-08 ENCOUNTER — Other Ambulatory Visit: Payer: Self-pay | Admitting: Family Medicine

## 2018-10-07 ENCOUNTER — Other Ambulatory Visit: Payer: Self-pay | Admitting: Family Medicine

## 2018-10-12 ENCOUNTER — Other Ambulatory Visit: Payer: Self-pay | Admitting: Family Medicine

## 2018-11-09 ENCOUNTER — Other Ambulatory Visit: Payer: Self-pay | Admitting: Family Medicine

## 2018-12-18 ENCOUNTER — Other Ambulatory Visit: Payer: Self-pay | Admitting: Internal Medicine

## 2018-12-18 ENCOUNTER — Other Ambulatory Visit: Payer: Self-pay

## 2018-12-18 ENCOUNTER — Ambulatory Visit (INDEPENDENT_AMBULATORY_CARE_PROVIDER_SITE_OTHER): Payer: BC Managed Care – PPO | Admitting: *Deleted

## 2018-12-18 ENCOUNTER — Telehealth: Payer: Self-pay | Admitting: Pharmacist

## 2018-12-18 DIAGNOSIS — Z5181 Encounter for therapeutic drug level monitoring: Secondary | ICD-10-CM | POA: Diagnosis not present

## 2018-12-18 DIAGNOSIS — I4892 Unspecified atrial flutter: Secondary | ICD-10-CM

## 2018-12-18 LAB — POCT INR: INR: 2.4 (ref 2.0–3.0)

## 2018-12-18 MED ORDER — APIXABAN 5 MG PO TABS: 5.0000 mg | ORAL_TABLET | Freq: Two times a day (BID) | ORAL | 1 refills | Status: AC

## 2018-12-18 NOTE — Telephone Encounter (Signed)

## 2018-12-18 NOTE — Addendum Note (Signed)
Addended by: Marcelle Overlie D on: 12/18/2018 04:31 PM   Modules accepted: Orders

## 2018-12-18 NOTE — Telephone Encounter (Signed)
Overdue for f/u - will wait until pt comes to appt this afternoon before refilling.

## 2019-02-18 ENCOUNTER — Telehealth: Payer: Self-pay | Admitting: Internal Medicine

## 2019-02-18 NOTE — Telephone Encounter (Signed)
  1. What dental office are you calling from? Dr Anselm Pancoast  2. What is your office phone number? [phone]  3. What is your fax number?  4. What type of procedure is the patient having performed? Tooth extraction  5. What date is procedure scheduled or is the patient there now? 02/22/19 (if the patient is at the dentist's office question goes to their cardiologist if he/she is in the office.  If not, question should go to the DOD).   6. What is your question (ex. Antibiotics prior to procedure, holding medication-we need to know how long dentist wants pt to hold med)?  Does patient need to stop Eliquis and how long?

## 2019-02-18 NOTE — Telephone Encounter (Signed)
   Primary Cardiologist: Dorris Carnes, MD  Chart reviewed as part of pre-operative protocol coverage. Simple dental extractions are considered low risk procedures per guidelines and generally do not require any specific cardiac clearance. It is also generally accepted that for simple extractions and dental cleanings, there is no need to interrupt blood thinner therapy.   SBE prophylaxis is not required for the patient.  I will route this recommendation to the requesting party via Epic fax function and remove from pre-op pool.  Please call with questions.  Abigail Butts, PA-C 02/18/2019, 10:09 AM

## 2019-04-12 ENCOUNTER — Emergency Department (HOSPITAL_COMMUNITY)
Admission: EM | Admit: 2019-04-12 | Discharge: 2019-04-12 | Disposition: A | Discharge status: Home / Self Care | Payer: BC Managed Care – PPO | Attending: Emergency Medicine | Admitting: Emergency Medicine

## 2019-04-12 ENCOUNTER — Emergency Department (HOSPITAL_COMMUNITY): Payer: BC Managed Care – PPO

## 2019-04-12 ENCOUNTER — Encounter (HOSPITAL_COMMUNITY): Payer: Self-pay | Admitting: Emergency Medicine

## 2019-04-12 ENCOUNTER — Other Ambulatory Visit: Payer: Self-pay

## 2019-04-12 DIAGNOSIS — I110 Hypertensive heart disease with heart failure: Secondary | ICD-10-CM | POA: Insufficient documentation

## 2019-04-12 DIAGNOSIS — Z7901 Long term (current) use of anticoagulants: Secondary | ICD-10-CM | POA: Diagnosis not present

## 2019-04-12 DIAGNOSIS — I4891 Unspecified atrial fibrillation: Secondary | ICD-10-CM | POA: Diagnosis not present

## 2019-04-12 DIAGNOSIS — I5022 Chronic systolic (congestive) heart failure: Secondary | ICD-10-CM | POA: Insufficient documentation

## 2019-04-12 DIAGNOSIS — Z79899 Other long term (current) drug therapy: Secondary | ICD-10-CM | POA: Diagnosis not present

## 2019-04-12 DIAGNOSIS — F17290 Nicotine dependence, other tobacco product, uncomplicated: Secondary | ICD-10-CM | POA: Diagnosis not present

## 2019-04-12 LAB — CBC
HCT: 46.6 % (ref 39.0–52.0)
Hemoglobin: 15.1 g/dL (ref 13.0–17.0)
MCH: 29.5 pg (ref 26.0–34.0)
MCHC: 32.4 g/dL (ref 30.0–36.0)
MCV: 91 fL (ref 80.0–100.0)
Platelets: 261 10*3/uL (ref 150–400)
RBC: 5.12 MIL/uL (ref 4.22–5.81)
RDW: 12.7 % (ref 11.5–15.5)
WBC: 7.6 10*3/uL (ref 4.0–10.5)
nRBC: 0 % (ref 0.0–0.2)

## 2019-04-12 LAB — BASIC METABOLIC PANEL
Anion gap: 11 (ref 5–15)
BUN: 8 mg/dL (ref 6–20)
CO2: 22 mmol/L (ref 22–32)
Calcium: 8.9 mg/dL (ref 8.9–10.3)
Chloride: 106 mmol/L (ref 98–111)
Creatinine, Ser: 0.59 mg/dL — ABNORMAL LOW (ref 0.61–1.24)
GFR calc Af Amer: >60 mL/min (ref >60)
GFR calc non Af Amer: >60 mL/min (ref >60)
Glucose, Bld: 124 mg/dL — ABNORMAL HIGH (ref 70–99)
Potassium: 4 mmol/L (ref 3.5–5.1)
Sodium: 139 mmol/L (ref 135–145)

## 2019-04-12 LAB — TROPONIN I (HIGH SENSITIVITY): Troponin I (High Sensitivity): 19 ng/L — ABNORMAL HIGH (ref <18)

## 2019-04-12 MED ORDER — PROPOFOL 10 MG/ML IV BOLUS
0.5000 mg/kg | Freq: Once | INTRAVENOUS | Status: DC
  Filled 2019-04-12: qty 20

## 2019-04-12 MED ORDER — PROPOFOL 10 MG/ML IV BOLUS
INTRAVENOUS | Status: AC | PRN
  Administered 2019-04-12: 51.05 mg via INTRAVENOUS

## 2019-04-12 NOTE — ED Notes (Signed)
Cancel second troponin per Dr. Stark Jock.

## 2019-04-12 NOTE — Sedation Documentation (Signed)
Synchronized shock of 120J delivered with successful conversion to NSR.

## 2019-04-12 NOTE — ED Triage Notes (Signed)
He states that it has been between 60 and 120's this morning

## 2019-04-12 NOTE — Progress Notes (Signed)
Patient discussed with ER staff, history of PAF presents with symptomatic afib. ER staff has confirmed he has not missed any doses of his xarelto within the last 3 weeks or more. Have recommended cardioversion in the ER, if maintains SR 1 hour post DCCV would be ok for discharge and outpatient follow up. Looks like last recurrence 1 year ago, would defer any changes in long term therapy to his outpatient follow up since fairly infrequent episodes   Carlyle Dolly MD

## 2019-04-12 NOTE — ED Triage Notes (Signed)
Pt states that he woke up with his afib he states that he has had this happen in the past. He denies any over s/s.

## 2019-04-12 NOTE — Sedation Documentation (Signed)
Pt now awake and talking °

## 2019-04-12 NOTE — ED Notes (Signed)
Pt fully awake and conversive.

## 2019-04-12 NOTE — Discharge Instructions (Addendum)
Continue medications as previously prescribed.  Follow-up with your cardiologist in the next 1 to 2 weeks, and return to the ER if symptoms worsen or change.  Be sure to take your Xarelto as directed.

## 2019-04-12 NOTE — ED Provider Notes (Addendum)
[facility] EMERGENCY DEPARTMENT Provider Note   CSN: [id number] Arrival date & time: 04/12/19  0841     History   Chief Complaint Chief Complaint  Patient presents with  . Atrial Fibrillation    HPI Matthew [name] is a 43 y.o. male.     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  He also has a history of paroxysmal A. fib.  He presents today with complaints of palpitations and rapid heartbeat.  This started yesterday evening while tossing a football with his kids.  Patient denies any chest pain or difficulty breathing.  He denies any recent fevers or chills.  The history is provided by the patient.  Atrial Fibrillation This is a recurrent problem. The current episode started yesterday. The problem occurs constantly. The problem has not changed since onset.Pertinent negatives include no chest pain and no shortness of breath. Nothing aggravates the symptoms. Nothing relieves the symptoms. He has tried nothing for the symptoms.    Past Medical History:  Diagnosis Date  . Alcohol abuse   . Anxiety   . Atypical atrial flutter ([place])   . Complete transposition of great vessels   . Depression   . Dyslipidemia   . GERD (gastroesophageal reflux disease)   . Headache    "weekly" (09/24/2014)  . Heart murmur   . Hypertension   . 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 SVT (supraventricular tachycardia) ([place]) "several times in the last year" (09/24/2014)  . 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 MDD (major depressive disorder), recurrent episode, moderate ([place]) 10/03/2017  . Abnormal EKG   . Nonspecific chest pain   . Hyponatremia 09/29/2017  . Transposition of great vessel 09/29/2017  . Dyslipidemia 09/29/2017  . GERD (gastroesophageal reflux disease) 09/29/2017  . Leukocytosis 09/29/2017  . Hyperbilirubinemia  09/29/2017  . Generalized anxiety disorder 09/18/2017  . Tachycardia 07/28/2014  . SVT (supraventricular tachycardia) ([place])   . Hypocalcemia   . Transaminitis   . A-fib ([place]) 04/21/2014  . Hallucination 01/04/2014  . Chronic systolic CHF (congestive heart failure) ([place]) 01/01/2014  . Pulmonary hypertension ([place]) 12/31/2013  . Metabolic acidosis 99991111  . Protein-calorie malnutrition, severe ([place]) 12/30/2013  . Nausea vomiting and diarrhea 12/29/2013  . Atrial flutter ([place]) 12/29/2013  . Hypokalemia 12/29/2013  . Hypomagnesemia 12/29/2013  . Alcohol abuse 12/29/2013  . Alcohol withdrawal ([place]) 12/29/2013  . Dehydration with hyponatremia 12/29/2013  . Depression 12/29/2013  . Hyperglycemia 12/29/2013  . Melena 12/29/2013  . Elevated transaminase level 12/29/2013  . HYPERLIPIDEMIA-MIXED 04/21/2009  . Essential hypertension 04/21/2009  . OTHER DISORDERS PAPILLARY MUSCLE 01/24/2009  . 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 FINGER ARTHROPLASTY Right    volar plate arthroplasty, right small finger proxima linterphalangeal joint [Other]  . FRACTURE SURGERY    . SEPTOSTOMY     Rashkind balloon atrial septostomy   . TRANSPOSITION OF GREAT VESSELS REPAIR  1980   "mustard procedure"        Home Medications    Prior to Admission medications   Medication Sig Start Date End Date Taking? Authorizing Provider  acetaminophen (TYLENOL) 500 MG tablet Take 1,000 mg by mouth every 6 (six) hours as needed for headache (pain).    [provider]  apixaban Arne Cleveland) 5  MG TABS tablet Take 1 tablet (5 mg total) by mouth 2 (two) times daily. 12/18/18   Fay Records, MD  escitalopram (LEXAPRO) 10 MG tablet Take 1 tablet by mouth once daily 10/07/18   Mikey Kirschner, MD  hydrOXYzine (ATARAX/VISTARIL) 25 MG tablet Take 1 tablet by mouth three times daily as needed 11/09/18   Mikey Kirschner, MD  losartan (COZAAR)  50 MG tablet Take 1 tablet (50 mg total) by mouth daily. 10/29/17 03/30/18  Leanor Kail, PA  metoprolol tartrate (LOPRESSOR) 25 MG tablet Take 50 mg (2 tabs) by mouth in AM and 25 mg (1 tab) by mouth in PM. Patient taking differently: Take 25-75 mg by mouth See admin instructions. Take 2 tablets (50 mg) by mouth every morning and 1 tablet (25 mg) at night, may take an additional tablet if needed for racing heart. 10/03/17   Kathie Dike, MD  Multiple Vitamin (MULTIVITAMIN WITH MINERALS) TABS tablet Take 1 tablet by mouth daily.    [provider]  oseltamivir (TAMIFLU) 75 MG capsule Take one tablet by mouth twice daily for 5 days. 08/28/18   Kathyrn Drown, MD  sodium chloride (OCEAN) 0.65 % SOLN nasal spray Place 1 spray into both nostrils daily as needed for congestion.    [provider]    Family History Family History  Problem Relation Age of Onset  . Rheumatic fever Father   . Hypertension Father     Social History Social History   Tobacco Use  . Smoking status: Never Smoker  . Smokeless tobacco: Current User    Types: Snuff  . Tobacco comment: "dipped when I played softball; none since 2011"  Substance Use Topics  . Alcohol use: Yes    Alcohol/week: 60.0 standard drinks    Types: 60 Shots of liquor per week    Comment: 09/24/2014 "1/5th vodka in 2 days; easy"  . Drug use: No     Allergies   Aspirin   Review of Systems Review of Systems  Respiratory: Negative for shortness of breath.   Cardiovascular: Negative for chest pain.  All other systems reviewed and are negative.    Physical Exam Updated Vital Signs BP (!) 121/92   Pulse (!) 121   Temp 98.5 F (36.9 C) (Oral)   Resp 14   Ht 6' 2.5" (1.892 m)   Wt 102.1 kg   SpO2 96%   BMI 28.50 kg/m   Physical Exam Vitals signs and nursing note reviewed.  Constitutional:      General: He is not in acute distress.    Appearance: He is well-developed. He is not diaphoretic.  HENT:      Head: Normocephalic and atraumatic.  Neck:     Musculoskeletal: Normal range of motion and neck supple.  Cardiovascular:     Rate and Rhythm: Tachycardia present. Rhythm irregular.     Heart sounds: No murmur. No friction rub.  Pulmonary:     Effort: Pulmonary effort is normal. No respiratory distress.     Breath sounds: Normal breath sounds. No wheezing or rales.  Abdominal:     General: Bowel sounds are normal. There is no distension.     Palpations: Abdomen is soft.     Tenderness: There is no abdominal tenderness.  Musculoskeletal: Normal range of motion.        General: No swelling or tenderness.     Right lower leg: No edema.     Left lower leg: No edema.  Skin:  General: Skin is warm and dry.  Neurological:     Mental Status: He is alert and oriented to person, place, and time.     Coordination: Coordination normal.      ED Treatments / Results  Labs (all labs ordered are listed, but only abnormal results are displayed) Labs Reviewed  BASIC METABOLIC PANEL - Abnormal; Notable for the following components:      Result Value   Glucose, Bld 124 (*)    Creatinine, Ser 0.59 (*)    All other components within normal limits  TROPONIN I (HIGH SENSITIVITY) - Abnormal; Notable for the following components:   Troponin I (High Sensitivity) 19 (*)    All other components within normal limits  CBC    EKG EKG Interpretation  Date/Time:  Monday April 12 2019 09:21:02 EDT Ventricular Rate:  122 PR Interval:    QRS Duration: 95 QT Interval:  334 QTC Calculation: 476 R Axis:   66 Text Interpretation:  Atrial flutter with predominant 2:1 AV block RVH with secondary repolarization abnrm Repol abnrm suggests ischemia, diffuse leads Baseline wander in lead(s) V2 Confirmed by Veryl Speak [phone]) on 04/12/2019 9:26:34 AM   Radiology Dg Chest 2 View  Result Date: 04/12/2019 CLINICAL DATA:  Chest pain.  History of atrial fibrillation EXAM: CHEST - 2 VIEW COMPARISON:  November 11 2017 FINDINGS: Status post median sternotomy. No edema or consolidation. There is mild scarring in the right upper lobe at the site of previous infiltrate. There is a 5 mm nodular opacity in the lateral right base region. Heart is upper normal in size. There is mild pulmonary venous hypertension. No adenopathy. No bone lesions IMPRESSION: Mild pulmonary vascular congestion. No edema or consolidation. Mild scarring right upper lobe. 5 mm nodular opacity in the lateral right base. This finding warrants either noncontrast enhanced chest CT to further assess or follow-up chest radiograph in 3-4 months to assess for stability. No adenopathy. Electronically Signed   By: Lowella Grip III M.D.   On: 04/12/2019 10:15    Procedures .Sedation  Date/Time: 04/12/2019 12:34 PM Performed by: Veryl Speak, MD Authorized by: Veryl Speak, MD   Consent:    Consent obtained:  Verbal and written   Consent given by:  Patient   Risks discussed:  Allergic reaction, dysrhythmia, inadequate sedation, nausea, prolonged hypoxia resulting in organ damage, prolonged sedation necessitating reversal, respiratory compromise necessitating ventilatory assistance and intubation and vomiting   Alternatives discussed:  Analgesia without sedation, anxiolysis and regional anesthesia Universal protocol:    Procedure explained and questions answered to patient or proxy's satisfaction: yes     Relevant documents present and verified: yes     Test results available and properly labeled: yes     Imaging studies available: yes     Required blood products, implants, devices, and special equipment available: yes     Site/side marked: yes     Immediately prior to procedure a time out was called: yes     Patient identity confirmation method:  Verbally with patient, arm band and provided demographic data Indications:    Procedure performed:  Cardioversion   Procedure necessitating sedation performed by:  Physician performing  sedation Pre-sedation assessment:    Time since last food or drink:  0700   ASA classification: class 1 - normal, healthy patient     Neck mobility: normal     Mouth opening:  3 or more finger widths   Thyromental distance:  4 finger widths   Mallampati  score:  I - soft palate, uvula, fauces, pillars visible   Pre-sedation assessments completed and reviewed: airway patency, cardiovascular function, hydration status, mental status, nausea/vomiting, pain level, respiratory function and temperature   Immediate pre-procedure details:    Reassessment: Patient reassessed immediately prior to procedure     Reviewed: vital signs, relevant labs/tests and NPO status     Verified: bag valve mask available, emergency equipment available, intubation equipment available, IV patency confirmed, oxygen available and suction available   Procedure details (see MAR for exact dosages):    Preoxygenation:  Nasal cannula   Sedation:  Propofol   Intra-procedure monitoring:  Blood pressure monitoring, cardiac monitor, continuous pulse oximetry, frequent LOC assessments, frequent vital sign checks and continuous capnometry   Intra-procedure events: none     Total Provider sedation time (minutes):  15 Post-procedure details:    Post-sedation assessment completed:  04/12/2019 12:34 PM   Attendance: Constant attendance by certified staff until patient recovered     Recovery: Patient returned to pre-procedure baseline     Post-sedation assessments completed and reviewed: airway patency, cardiovascular function, hydration status, mental status, nausea/vomiting, pain level, respiratory function and temperature     Patient is stable for discharge or admission: yes     Patient tolerance:  Tolerated well, no immediate complications Comments:     Patient back in NSR.  Tolerated procedure well. .Cardioversion  Date/Time: 04/12/2019 12:35 PM Performed by: Veryl Speak, MD Authorized by: Veryl Speak, MD   Consent:     Consent obtained:  Verbal and written   Consent given by:  Patient   Risks discussed:  Cutaneous burn, death and induced arrhythmia   Alternatives discussed:  No treatment and rate-control medication Pre-procedure details:    Cardioversion basis:  Emergent   Rhythm:  Atrial fibrillation   Electrode placement:  Anterior-posterior Patient sedated: Yes. Refer to sedation procedure documentation for details of sedation.  Attempt one:    Cardioversion mode:  Synchronous   Waveform:  Biphasic   Shock (Joules):  120   Shock outcome:  Conversion to normal sinus rhythm Post-procedure details:    Patient status:  Alert   Patient tolerance of procedure:  Tolerated well, no immediate complications Comments:     Back in NSR, feeling better, tolerated procedure well.     (including critical care time)  Medications Ordered in ED Medications - No data to display   Initial Impression / Assessment and Plan / ED Course  I have reviewed the triage vital signs and the nursing notes.  Pertinent labs & imaging results that were available during my care of the patient were reviewed by me and considered in my medical decision making (see chart for details).  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 fib.  He presents today with complaints of palpitations since last night.  Patient found to be in atrial fibrillation with rapid ventricular response.  Care was discussed with Dr. Harl Bowie from cardiology.  He has reviewed the patient's record and is in agreement with me that cardioversion is appropriate.  Patient has been compliant with his Xarelto and is also comfortable with having this procedure performed.  Conscious sedation was performed using propofol, then the patient was successfully cardioverted to a sinus rhythm using 100 J of electricity.  Patient tolerated the procedure well.  He is now back in sinus rhythm, vitals are stable, and is  awake, alert, and feeling much better.  He will be  discharged to home and is to follow-up with his cardiologist in the next 1 to 2 weeks.  CRITICAL CARE Performed by: Veryl Speak Total critical care time: 45 minutes Critical care time was exclusive of separately billable procedures and treating other patients. Critical care was necessary to treat or prevent imminent or life-threatening deterioration. Critical care was time spent personally by me on the following activities: development of treatment plan with patient and/or surrogate as well as nursing, discussions with consultants, evaluation of patient's response to treatment, examination of patient, obtaining history from patient or surrogate, ordering and performing treatments and interventions, ordering and review of laboratory studies, ordering and review of radiographic studies, pulse oximetry and re-evaluation of patient's condition.   Final Clinical Impressions(s) / ED Diagnoses   Final diagnoses:  None    ED Discharge Orders    None       Veryl Speak, MD 04/12/19 1233    Veryl Speak, MD 04/12/19 1236

## 2019-04-19 ENCOUNTER — Other Ambulatory Visit: Payer: Self-pay | Admitting: Registered"

## 2019-04-19 ENCOUNTER — Encounter: Payer: Self-pay | Admitting: Family Medicine

## 2019-04-19 ENCOUNTER — Other Ambulatory Visit: Payer: Self-pay

## 2019-04-19 ENCOUNTER — Ambulatory Visit (INDEPENDENT_AMBULATORY_CARE_PROVIDER_SITE_OTHER): Payer: BC Managed Care – PPO | Admitting: Family Medicine

## 2019-04-19 ENCOUNTER — Other Ambulatory Visit: Payer: Self-pay | Admitting: Family Medicine

## 2019-04-19 DIAGNOSIS — Z20822 Contact with and (suspected) exposure to covid-19: Secondary | ICD-10-CM

## 2019-04-19 DIAGNOSIS — Z20828 Contact with and (suspected) exposure to other viral communicable diseases: Secondary | ICD-10-CM

## 2019-04-19 MED ORDER — AMOXICILLIN 500 MG PO CAPS: ORAL_CAPSULE | ORAL | 0 refills | Status: DC

## 2019-04-19 NOTE — Progress Notes (Signed)
   Subjective:  Audio plus video  Patient ID: MAXIMUS CHERNOW, male    DOB: 1976-06-24, 43 y.o.   MRN: [id number]  Sinusitis This is a new problem. Episode onset: pt was in ED on Monday for Afib; symptoms started after he left Monday evening  (Sneezing all weekend, tired, no fever, hamstrings are sore(pt was outside and at work this weekend), cough (clear phelgm). Pt had a headache about a week ago. Sore throat when coughing, decreased appetite.) Past treatments include acetaminophen (Saline spray). The treatment provided mild relief.   Virtual Visit via Video Note  I connected with [name] on 04/19/19 at  9:30 AM EDT by a video enabled telemedicine application and verified that I am speaking with the correct person using two identifiers.  Location: Patient: home Provider: office   I discussed the limitations of evaluation and management by telemedicine and the availability of in person appointments. The patient expressed understanding and agreed to proceed.  History of Present Illness:    Observations/Objective:   Assessment and Plan:   Follow Up Instructions:    I discussed the assessment and treatment plan with the patient. The patient was provided an opportunity to ask questions and all were answered. The patient agreed with the plan and demonstrated an understanding of the instructions.   The patient was advised to call back or seek an in-person evaluation if the symptoms worsen or if the condition fails to improve as anticipated.  I provided 18 minutes of non-face-to-face time during this encounter.   Vicente Males, LPN  Patient's emergency room report reviewed.  Went into A. fib.  Was successfully converted to sinus rhythm.  Several days after this started developing congestion.  Then over the last several days severe sneezing.  Accompanied by cough.  Cough now intermittent and significant and productive of phlegm.  Some malaise.  Also deep achiness in both  legs.  Review of Systems No headache no chest pain no abdominal    Objective:   Physical Exam   Virtual     Assessment & Plan:  Impression suspected COVID-19.  Will cover with antibiotic for potential bronchitis.  With exposure in emergency room several days prior to emergence of symptoms this is quite concerning.  Also has history of heart disease with congenital heart disease history and rhythm issues as demonstrated recently.  Symptom care discussed.  Warning signs discussed.

## 2019-04-20 ENCOUNTER — Other Ambulatory Visit: Payer: Self-pay | Admitting: Family Medicine

## 2019-04-20 MED ORDER — HYDROXYZINE HCL 25 MG PO TABS: 25.0000 mg | ORAL_TABLET | Freq: Three times a day (TID) | ORAL | 2 refills | Status: DC | PRN

## 2019-04-20 NOTE — Telephone Encounter (Signed)
Ok pplus 2 ref

## 2019-04-20 NOTE — Telephone Encounter (Signed)
3 mo lexapro also

## 2019-04-21 ENCOUNTER — Telehealth: Payer: Self-pay | Admitting: Family Medicine

## 2019-04-21 LAB — NOVEL CORONAVIRUS, NAA: SARS-CoV-2, NAA: NOT DETECTED

## 2019-04-21 NOTE — Telephone Encounter (Signed)
Patient was seen 04/19/19 for cough congestion and body aches. Patient is still having some cough and congestion but stated work told him he could return to work with a doctor's note and negative Covid test. Please advise

## 2019-04-21 NOTE — Telephone Encounter (Signed)
Covid test was negative - pt needs work note to return to work   1st day out 04/19/2019, to return to work 04/22/2019 due to negative results   Please advise & call pt when done (explained we could mail note or fax note or pt can print from [place])

## 2019-04-22 ENCOUNTER — Telehealth: Payer: Self-pay | Admitting: Family Medicine

## 2019-04-22 ENCOUNTER — Encounter: Payer: Self-pay | Admitting: Family Medicine

## 2019-04-22 NOTE — Telephone Encounter (Signed)
Pt called to check on work note

## 2019-04-22 NOTE — Telephone Encounter (Signed)
Sorry I have worked virtually every [employer] since the pt responded to my last question. Go ahead and write work note to return next Monday and fax, also send copy of covid test

## 2019-04-22 NOTE — Telephone Encounter (Signed)
Pt is calling again to check on work note    States his employer wants to know if it will be faxed in by the end of today   Please see phone message from 04/21/2019

## 2019-04-22 NOTE — Telephone Encounter (Signed)
I am concerned pt could possibly have the covid 19 with a false neg result due to his symtoms and er exoisre, is he willing to at least wait til next wk to return n make sure he is sig improved before returing???

## 2019-04-22 NOTE — Telephone Encounter (Signed)
Patient s ok waiting till next week and stated his 10 days would have him returning to work on Monday. Patient needs a note faxed to work for this week and return on Monday if ok Fax to [phone] Attn: Heinz Knuckles

## 2019-06-16 DIAGNOSIS — I519 Heart disease, unspecified: Secondary | ICD-10-CM | POA: Insufficient documentation

## 2019-08-16 ENCOUNTER — Encounter: Payer: Self-pay | Admitting: Family Medicine

## 2019-08-17 ENCOUNTER — Ambulatory Visit: Payer: BC Managed Care – PPO | Admitting: Family Medicine

## 2019-09-13 ENCOUNTER — Ambulatory Visit (INDEPENDENT_AMBULATORY_CARE_PROVIDER_SITE_OTHER): Payer: BC Managed Care – PPO | Admitting: Family Medicine

## 2019-09-13 ENCOUNTER — Other Ambulatory Visit: Payer: Self-pay

## 2019-09-13 ENCOUNTER — Encounter: Payer: Self-pay | Admitting: Family Medicine

## 2019-09-13 DIAGNOSIS — Z20822 Contact with and (suspected) exposure to covid-19: Secondary | ICD-10-CM

## 2019-09-13 NOTE — Progress Notes (Signed)
   Subjective:    Patient ID: Matthew [name], male    DOB: 1976/06/05, 44 y.o.   MRN: [id number]  Headache  This is a new problem. Associated symptoms comments: Leg pain at hamstring/calf area, fatigue, congestion, pt states he wakes up sweating (not unusual). Treatments tried: Vit C, Tylenol.  Pt informed of how to get tested. Pt states he may have a rapid test done this evening if he feels like driving.   Virtual Visit via Video Note  I connected with [name] on 09/13/19 at  3:50 PM EST by a video enabled telemedicine application and verified that I am speaking with the correct person using two identifiers.  Location: Patient: home Provider: office   I discussed the limitations of evaluation and management by telemedicine and the availability of in person appointments. The patient expressed understanding and agreed to proceed.  History of Present Illness:    Observations/Objective:   Assessment and Plan:   Follow Up Instructions:    I discussed the assessment and treatment plan with the patient. The patient was provided an opportunity to ask questions and all were answered. The patient agreed with the plan and demonstrated an understanding of the instructions.   The patient was advised to call back or seek an in-person evaluation if the symptoms worsen or if the condition fails to improve as anticipated.  I provided 22 minutes of non-face-to-face time during this encounter.       Review of Systems  Neurological: Positive for headaches.       Objective:   Physical Exam   Virtual     Assessment & Plan:  Impression several days of congestion drainage achiness sore throat leg pain.  Some potential exposures at work regarding coworkers with [place].  Symptomatic care discussed.  Add Hycodan as needed for cough for bad headache.  Covid testing strongly encouraged.  Warning signs discussed

## 2019-09-14 ENCOUNTER — Other Ambulatory Visit: Payer: Self-pay

## 2019-09-14 ENCOUNTER — Telehealth: Payer: Self-pay | Admitting: Family Medicine

## 2019-09-14 ENCOUNTER — Ambulatory Visit: Payer: BC Managed Care – PPO | Attending: Internal Medicine

## 2019-09-14 DIAGNOSIS — Z20822 Contact with and (suspected) exposure to covid-19: Secondary | ICD-10-CM

## 2019-09-14 MED ORDER — HYDROCODONE-HOMATROPINE 5-1.5 MG/5ML PO SYRP: 5.0000 mL | ORAL_SOLUTION | Freq: Four times a day (QID) | ORAL | 0 refills | Status: DC | PRN

## 2019-09-14 NOTE — Telephone Encounter (Signed)
Medication was sent in as requested 

## 2019-09-14 NOTE — Telephone Encounter (Signed)
Nurses Please go ahead with Hycodan syrup 5 mL taken every 6 hours as needed for cough caution drowsiness 100 mL Please pend Patient may take medication when at home only not with driving

## 2019-09-14 NOTE — Telephone Encounter (Signed)
Patient called this morning checking on cough medication that was suppose to be called into [facility] yesterday after his phone visit. Please advise

## 2019-09-14 NOTE — Telephone Encounter (Signed)
Patient notified

## 2019-09-15 ENCOUNTER — Telehealth: Payer: Self-pay | Admitting: Family Medicine

## 2019-09-15 LAB — NOVEL CORONAVIRUS, NAA: SARS-CoV-2, NAA: NOT DETECTED

## 2019-09-15 NOTE — Telephone Encounter (Signed)
Pt has received covid results. They are negative he is needing a note stating he is negative and when he can return to work. Also needs copy of results printed out.

## 2019-09-16 NOTE — Telephone Encounter (Signed)
Telephone call-voicemail not set up ?

## 2019-09-16 NOTE — Telephone Encounter (Signed)
Does he feel ready to return? When would like to return? wis

## 2019-09-22 NOTE — Telephone Encounter (Signed)
Tried to call no answer

## 2019-09-24 NOTE — Telephone Encounter (Signed)
Pt states he already returned and he is feeling better. He used the copy of results off of his mychart and states he is dong fine now. No issues

## 2019-11-21 ENCOUNTER — Emergency Department (HOSPITAL_COMMUNITY)
Admission: EM | Admit: 2019-11-21 | Discharge: 2019-11-21 | Disposition: A | Discharge status: Home / Self Care | Payer: BC Managed Care – PPO | Attending: Emergency Medicine | Admitting: Emergency Medicine

## 2019-11-21 ENCOUNTER — Encounter (HOSPITAL_COMMUNITY): Payer: Self-pay | Admitting: Emergency Medicine

## 2019-11-21 ENCOUNTER — Emergency Department (HOSPITAL_COMMUNITY): Payer: BC Managed Care – PPO

## 2019-11-21 ENCOUNTER — Other Ambulatory Visit: Payer: Self-pay

## 2019-11-21 DIAGNOSIS — Z79899 Other long term (current) drug therapy: Secondary | ICD-10-CM | POA: Insufficient documentation

## 2019-11-21 DIAGNOSIS — Y939 Activity, unspecified: Secondary | ICD-10-CM | POA: Diagnosis not present

## 2019-11-21 DIAGNOSIS — Y999 Unspecified external cause status: Secondary | ICD-10-CM | POA: Diagnosis not present

## 2019-11-21 DIAGNOSIS — S82832A Other fracture of upper and lower end of left fibula, initial encounter for closed fracture: Secondary | ICD-10-CM | POA: Diagnosis not present

## 2019-11-21 DIAGNOSIS — X500XXA Overexertion from strenuous movement or load, initial encounter: Secondary | ICD-10-CM | POA: Insufficient documentation

## 2019-11-21 DIAGNOSIS — Y929 Unspecified place or not applicable: Secondary | ICD-10-CM | POA: Insufficient documentation

## 2019-11-21 DIAGNOSIS — I5022 Chronic systolic (congestive) heart failure: Secondary | ICD-10-CM | POA: Insufficient documentation

## 2019-11-21 DIAGNOSIS — S99912A Unspecified injury of left ankle, initial encounter: Secondary | ICD-10-CM | POA: Diagnosis present

## 2019-11-21 DIAGNOSIS — I110 Hypertensive heart disease with heart failure: Secondary | ICD-10-CM | POA: Diagnosis not present

## 2019-11-21 DIAGNOSIS — Z7901 Long term (current) use of anticoagulants: Secondary | ICD-10-CM | POA: Insufficient documentation

## 2019-11-21 DIAGNOSIS — R609 Edema, unspecified: Secondary | ICD-10-CM

## 2019-11-21 DIAGNOSIS — F17290 Nicotine dependence, other tobacco product, uncomplicated: Secondary | ICD-10-CM | POA: Diagnosis not present

## 2019-11-21 MED ORDER — IBUPROFEN 800 MG PO TABS
800.0000 mg | ORAL_TABLET | Freq: Once | ORAL | Status: AC
  Administered 2019-11-21: 800 mg via ORAL
  Filled 2019-11-21: qty 1

## 2019-11-21 NOTE — Discharge Instructions (Signed)
You have a very small chip fracture over the outside of your ankle, this should heal by itself without surgery most likely.  Please use the walking boot, you may use the crutches to help you walk, you can do "touchdown" weightbearing but please try to avoid putting her whole weight on the ankle.  You should elevate it, apply ice and take ibuprofen 3 times a day.  Seek medical exam for severe or worsening symptoms  Please call the office of Dr. Aline Brochure or the orthopedist of your choice for follow-up this week.

## 2019-11-21 NOTE — ED Triage Notes (Signed)
Mowing mother's yard and stepped into a hole turning ankle   Now "can't put no weight on it"

## 2019-11-21 NOTE — ED Provider Notes (Signed)
[facility] EMERGENCY DEPARTMENT Provider Note   CSN: [id number] Arrival date & time: 11/21/19  1447     History Chief Complaint  Patient presents with  . Ankle Pain    Matthew [name] is a 44 y.o. male.  HPI    This patient is a 44 year old male, presents approximately 24 hours after injuring his left ankle.  He reports that he was stepping off of a trailer when his left foot landed in a hole and caused him to twist.  He feels like the foot was twisted laterally as if it was dislocated.  He was able to get it back into place and since that time has had pain with ambulation although he has been able to hobble around on it.  He states that today the pain got worse, he has noticed some swelling laterally and has a significant amount of pain trying to stand.  The pain goes away completely when he is in bed unless he rolls his foot laterally in which case it hurts over the lateral malleolus.  He denies any other injuries, he denies numbness or tingling to this area.  This is been persistent, fluctuates based on position and weightbearing status.  No prior injury to this ankle.  Past Medical History:  Diagnosis Date  . Alcohol abuse   . Anxiety   . Atypical atrial flutter ([place])   . Complete transposition of great vessels   . Depression   . Dyslipidemia   . GERD (gastroesophageal reflux disease)   . Headache    "weekly" (09/24/2014)  . Heart murmur   . Hypertension   . 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 SVT (supraventricular tachycardia) ([place]) "several times in the last year" (09/24/2014)  . 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 MDD (major depressive disorder), recurrent episode, moderate ([employer]) 10/03/2017  . Abnormal EKG   . Nonspecific chest pain   . Hyponatremia 09/29/2017  . Transposition of great vessel 09/29/2017  . Dyslipidemia 09/29/2017  . GERD (gastroesophageal reflux disease)  09/29/2017  . Leukocytosis 09/29/2017  . Hyperbilirubinemia 09/29/2017  . Generalized anxiety disorder 09/18/2017  . Tachycardia 07/28/2014  . SVT (supraventricular tachycardia) ([place])   . Hypocalcemia   . Transaminitis   . A-fib ([place]) 04/21/2014  . Hallucination 01/04/2014  . Chronic systolic CHF (congestive heart failure) ([place]) 01/01/2014  . Pulmonary hypertension ([place]) 12/31/2013  . Metabolic acidosis 99991111  . Protein-calorie malnutrition, severe ([place]) 12/30/2013  . Nausea vomiting and diarrhea 12/29/2013  . Atrial flutter ([place]) 12/29/2013  . Hypokalemia 12/29/2013  . Hypomagnesemia 12/29/2013  . Alcohol abuse 12/29/2013  . Alcohol withdrawal ([place]) 12/29/2013  . Dehydration with hyponatremia 12/29/2013  . Depression 12/29/2013  . Hyperglycemia 12/29/2013  . Melena 12/29/2013  . Elevated transaminase level 12/29/2013  . HYPERLIPIDEMIA-MIXED 04/21/2009  . Essential hypertension 04/21/2009  . OTHER DISORDERS PAPILLARY MUSCLE 01/24/2009  . 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 FINGER ARTHROPLASTY Right    volar plate arthroplasty, right small finger proxima linterphalangeal joint [Other]  . FRACTURE SURGERY    . SEPTOSTOMY     Rashkind balloon atrial septostomy   . TRANSPOSITION OF GREAT VESSELS REPAIR  1980   "mustard procedure"       Family History  Problem Relation Age of Onset  . Rheumatic fever Father   . Hypertension  Father     Social History   Tobacco Use  . Smoking status: Never Smoker  . Smokeless tobacco: Current User    Types: Snuff  . Tobacco comment: "dipped when I played softball; none since 2011"  Substance Use Topics  . Alcohol use: Yes    Alcohol/week: 60.0 standard drinks    Types: 60 Shots of liquor per week    Comment: 09/24/2014 "1/5th vodka in 2 days; easy"  . Drug use: No    Home Medications Prior to Admission medications   Medication Sig Start Date End Date  Taking? Authorizing Provider  acetaminophen (TYLENOL) 500 MG tablet Take 1,000 mg by mouth every 6 (six) hours as needed for headache (pain).    [provider]  apixaban (ELIQUIS) 5 MG TABS tablet Take 1 tablet (5 mg total) by mouth 2 (two) times daily. 12/18/18   Fay Records, MD  escitalopram (LEXAPRO) 10 MG tablet Take 1 tablet by mouth once daily 04/20/19   Mikey Kirschner, MD  HYDROcodone-homatropine [facility]) 5-1.5 MG/5ML syrup Take 5 mLs by mouth every 6 (six) hours as needed for cough. Caution drowsiness- home use only 09/14/19   Kathyrn Drown, MD  hydrOXYzine (ATARAX/VISTARIL) 25 MG tablet Take 1 tablet by mouth three times daily as needed 04/20/19   Mikey Kirschner, MD  losartan (COZAAR) 50 MG tablet Take 1 tablet (50 mg total) by mouth daily. 10/29/17 03/30/18  Leanor Kail, PA  metoprolol tartrate (LOPRESSOR) 25 MG tablet Take 50 mg (2 tabs) by mouth in AM and 25 mg (1 tab) by mouth in PM. Patient taking differently: Take 25-75 mg by mouth See admin instructions. Take 2 tablets (50 mg) by mouth every morning and 1 tablet (25 mg) at night, may take an additional tablet if needed for racing heart. 10/03/17   Kathie Dike, MD  Multiple Vitamin (MULTIVITAMIN WITH MINERALS) TABS tablet Take 1 tablet by mouth daily.    [provider]  sodium chloride (OCEAN) 0.65 % SOLN nasal spray Place 1 spray into both nostrils daily as needed for congestion.    [provider]    Allergies    Aspirin  Review of Systems   Review of Systems  Musculoskeletal: Positive for arthralgias.  Skin: Negative for wound.  Neurological: Negative for weakness and numbness.  Hematological: Does not bruise/bleed easily.    Physical Exam Updated Vital Signs BP 117/75 (BP Location: Left Arm)   Pulse 76   Temp 98.4 F (36.9 C) (Oral)   Resp 16   Ht 1.88 m (6\' 2" )   Wt 102.1 kg   SpO2 95%   BMI 28.89 kg/m   Physical Exam Vitals and nursing note reviewed.    Constitutional:      General: He is not in acute distress.    Appearance: He is well-developed. He is not diaphoretic.  HENT:     Head: Normocephalic and atraumatic.  Eyes:     General: No scleral icterus.    Conjunctiva/sclera: Conjunctivae normal.  Cardiovascular:     Rate and Rhythm: Normal rate and regular rhythm.  Pulmonary:     Effort: Pulmonary effort is normal.     Breath sounds: Normal breath sounds.  Musculoskeletal:        General: Tenderness present.     Comments: Tenderness over the lateral malleolus over the tip as well as the body of the malleolus.  There is no tenderness above 2 cm proximal to the malleolus.  His medial malleolus  is not tender.  There is no pain over the base of the fifth metatarsal or the midfoot.  He has normal pulses of the left foot, he has a normal exam of the proximal fibula on the left and normal range of motion of the knee.  Skin:    General: Skin is warm and dry.     Findings: No rash.     Comments: No skin or dermal injuries  Neurological:     Mental Status: He is alert.     Comments: Normal sensation of the left foot in its entirety     ED Results / Procedures / Treatments   Labs (all labs ordered are listed, but only abnormal results are displayed) Labs Reviewed - No data to display  EKG None  Radiology No results found.  Procedures Procedures (including critical care time)  Medications Ordered in ED Medications - No data to display  ED Course  I have reviewed the triage vital signs and the nursing notes.  Pertinent labs & imaging results that were available during my care of the patient were reviewed by me and considered in my medical decision making (see chart for details).    MDM Rules/Calculators/A&P                       This patient presents to the ED for concern of ankle injury, this involves an extensive number of treatment options, and is a complaint that carries with it a high risk of complications and  morbidity.  The differential diagnosis includes fracture, dislocation, ligamentous injury   Lab Tests:   I Ordered, reviewed, and interpreted labs, which included none  Medicines ordered:   I ordered medication ibuprofen for inflammation and pain  Imaging Studies ordered:   I ordered imaging studies which included ankle x-ray on the left, complete series and  I independently visualized and interpreted imaging which showed the patient has a distal tip of the lateral malleolus fracture.  Weber a classification, stable mortise   Consultations Obtained:   I consulted the patient and discussed lab and imaging findings  Reevaluation:  After the interventions stated above, I reevaluated the patient and found he was improved, placed in a walking boot with crutches and encouraged to have touchdown weightbearing  Critical Interventions:  . Immobilization for small avulsion fracture off the tip of the lateral malleolus   Final Clinical Impression(s) / ED Diagnoses Final diagnoses:  Other closed fracture of distal end of left fibula, initial encounter    Rx / DC Orders ED Discharge Orders    None       Noemi Chapel, MD 11/21/19 1524

## 2019-11-30 ENCOUNTER — Ambulatory Visit: Payer: BC Managed Care – PPO | Admitting: Orthopedic Surgery

## 2019-11-30 ENCOUNTER — Other Ambulatory Visit: Payer: Self-pay

## 2019-11-30 VITALS — BP 142/89 | HR 64 | Ht 74.0 in | Wt 228.0 lb

## 2019-11-30 DIAGNOSIS — S82839A Other fracture of upper and lower end of unspecified fibula, initial encounter for closed fracture: Secondary | ICD-10-CM

## 2019-11-30 NOTE — Progress Notes (Signed)
EMERGENCY ROOM FOLLOW UP  NEW PROBLEM/PATIENT   Patient ID: Matthew [name], male   DOB: [birth year], 44 y.o.   MRN: [id number]  Emergency room record from (date) May 9 has been reviewed and this is included by reference and includes the review of systems with the following addition:   Chief Complaint  Patient presents with  . Ankle Injury    left 11/20/19 injury     HPI Matthew [name] is a 44 y.o. male.  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  He was diagnosed with an avulsion fracture of the fibula left ankle was placed in a cam walker and now 10 days later says it feels much better he went back to work this week was able to tolerate work in the boot  He did not tolerate more than 45 minutes out of the boot at his home   Review of Systems Review of Systems  Neurological: Negative for numbness.  All other systems reviewed and are negative.    has a past medical history of Alcohol abuse, Anxiety, Atypical atrial flutter ([place]), Complete transposition of great vessels, Depression, Dyslipidemia, GERD (gastroesophageal reflux disease), Headache, Heart murmur, Hypertension, [facility] spotted fever (11/2013), SVT (supraventricular tachycardia) ([facility]) ("several times in the last year" (09/24/2014)), and Transposition of great vessel.   Past Surgical History:  Procedure Laterality Date  . FINGER ARTHROPLASTY Right    volar plate arthroplasty, right small finger proxima linterphalangeal joint [Other]  . FRACTURE SURGERY    . SEPTOSTOMY     Rashkind balloon atrial septostomy   . TRANSPOSITION OF GREAT VESSELS REPAIR  1980   "mustard procedure"    Family History  Problem Relation Age of Onset  . Rheumatic fever Father   . Hypertension Father     Social History Social History   Tobacco Use  .  Smoking status: Never Smoker  . Smokeless tobacco: Current User    Types: Snuff  . Tobacco comment: "dipped when I played softball; none since 2011"  Substance Use Topics  . Alcohol use: Yes    Alcohol/week: 60.0 standard drinks    Types: 60 Shots of liquor per week    Comment: 09/24/2014 "1/5th vodka in 2 days; easy"  . Drug use: No    Allergies  Allergen Reactions  . Aspirin Other (See Comments)    Cardiologist told pt not to take    Current Outpatient Medications  Medication Sig Dispense Refill  . acetaminophen (TYLENOL) 500 MG tablet Take 1,000 mg by mouth every 6 (six) hours as needed for headache (pain).    Marland Kitchen apixaban (ELIQUIS) 5 MG TABS tablet Take 1 tablet (5 mg total) by mouth 2 (two) times daily. 180 tablet 1  . escitalopram (LEXAPRO) 10 MG tablet Take 1 tablet by mouth once daily 30 tablet 3  . hydrOXYzine (ATARAX/VISTARIL) 25 MG tablet Take 1 tablet by mouth three times daily as needed 30 tablet 3  . metoprolol tartrate (LOPRESSOR) 25 MG tablet Take 50 mg (2 tabs) by mouth in AM and 25 mg (1 tab) by mouth in PM. (Patient taking differently: Take 25-75 mg by mouth See admin instructions. Take 2 tablets (50 mg) by mouth every morning and 1 tablet (25 mg) at night, may take an additional tablet  if needed for racing heart.) 270 tablet 3  . Multiple Vitamin (MULTIVITAMIN WITH MINERALS) TABS tablet Take 1 tablet by mouth daily.    . sodium chloride (OCEAN) 0.65 % SOLN nasal spray Place 1 spray into both nostrils daily as needed for congestion.    Marland Kitchen losartan (COZAAR) 50 MG tablet Take 1 tablet (50 mg total) by mouth daily. 90 tablet 3   No current facility-administered medications for this visit.    Physical Exam BP (!) 142/89   Pulse 64   Ht 6\' 2"  (1.88 m)   Wt 228 lb (103.4 kg)   BMI 29.27 kg/m  Body mass index is 29.27 kg/m.  Well developed and well nourished  Gait, ambulatory with a cam walker full weightbearing  alert and oriented x 3  Normal affect and  mood  Ortho Exam The ankle looks good no bruising no ecchymosis tenderness tip of the fibula no tenderness medial side or proximal no to syndesmosis tenderness ankle motion normal feels stable muscle tone normal no atrophy neurovascular exam intact  Data Reviewed IMAGING From THE ER AND THE REPORT ARE REVIEWED, MY INTERPRETATION OF THE IMAGE(S) IS :  Avulsion fracture lateral malleolus left ankle ankle mortise otherwise intact  Assessment  Left ankle avulsion fracture Encounter Diagnosis  Name Primary?  Marland Kitchen Avulsion fracture of distal end of fibula Yes    Plan   Self management patient can wean himself out of the boot follow-up as needed he is full weightbearing   Arther Abbott, MD 11/30/2019 3:02 PM

## 2019-11-30 NOTE — Patient Instructions (Signed)
Self management : take the boot off anytime after 12/04/19

## 2019-12-16 IMAGING — DX DG CHEST 2V
2 series · 2 of 2 positions shown · non-contrast
Comparison: Chest x-ray dated September 30, 2017.

CLINICAL DATA: Cough with fever and body aches for the past 4 days.

EXAM:
CHEST - 2 VIEW

[chest pa]
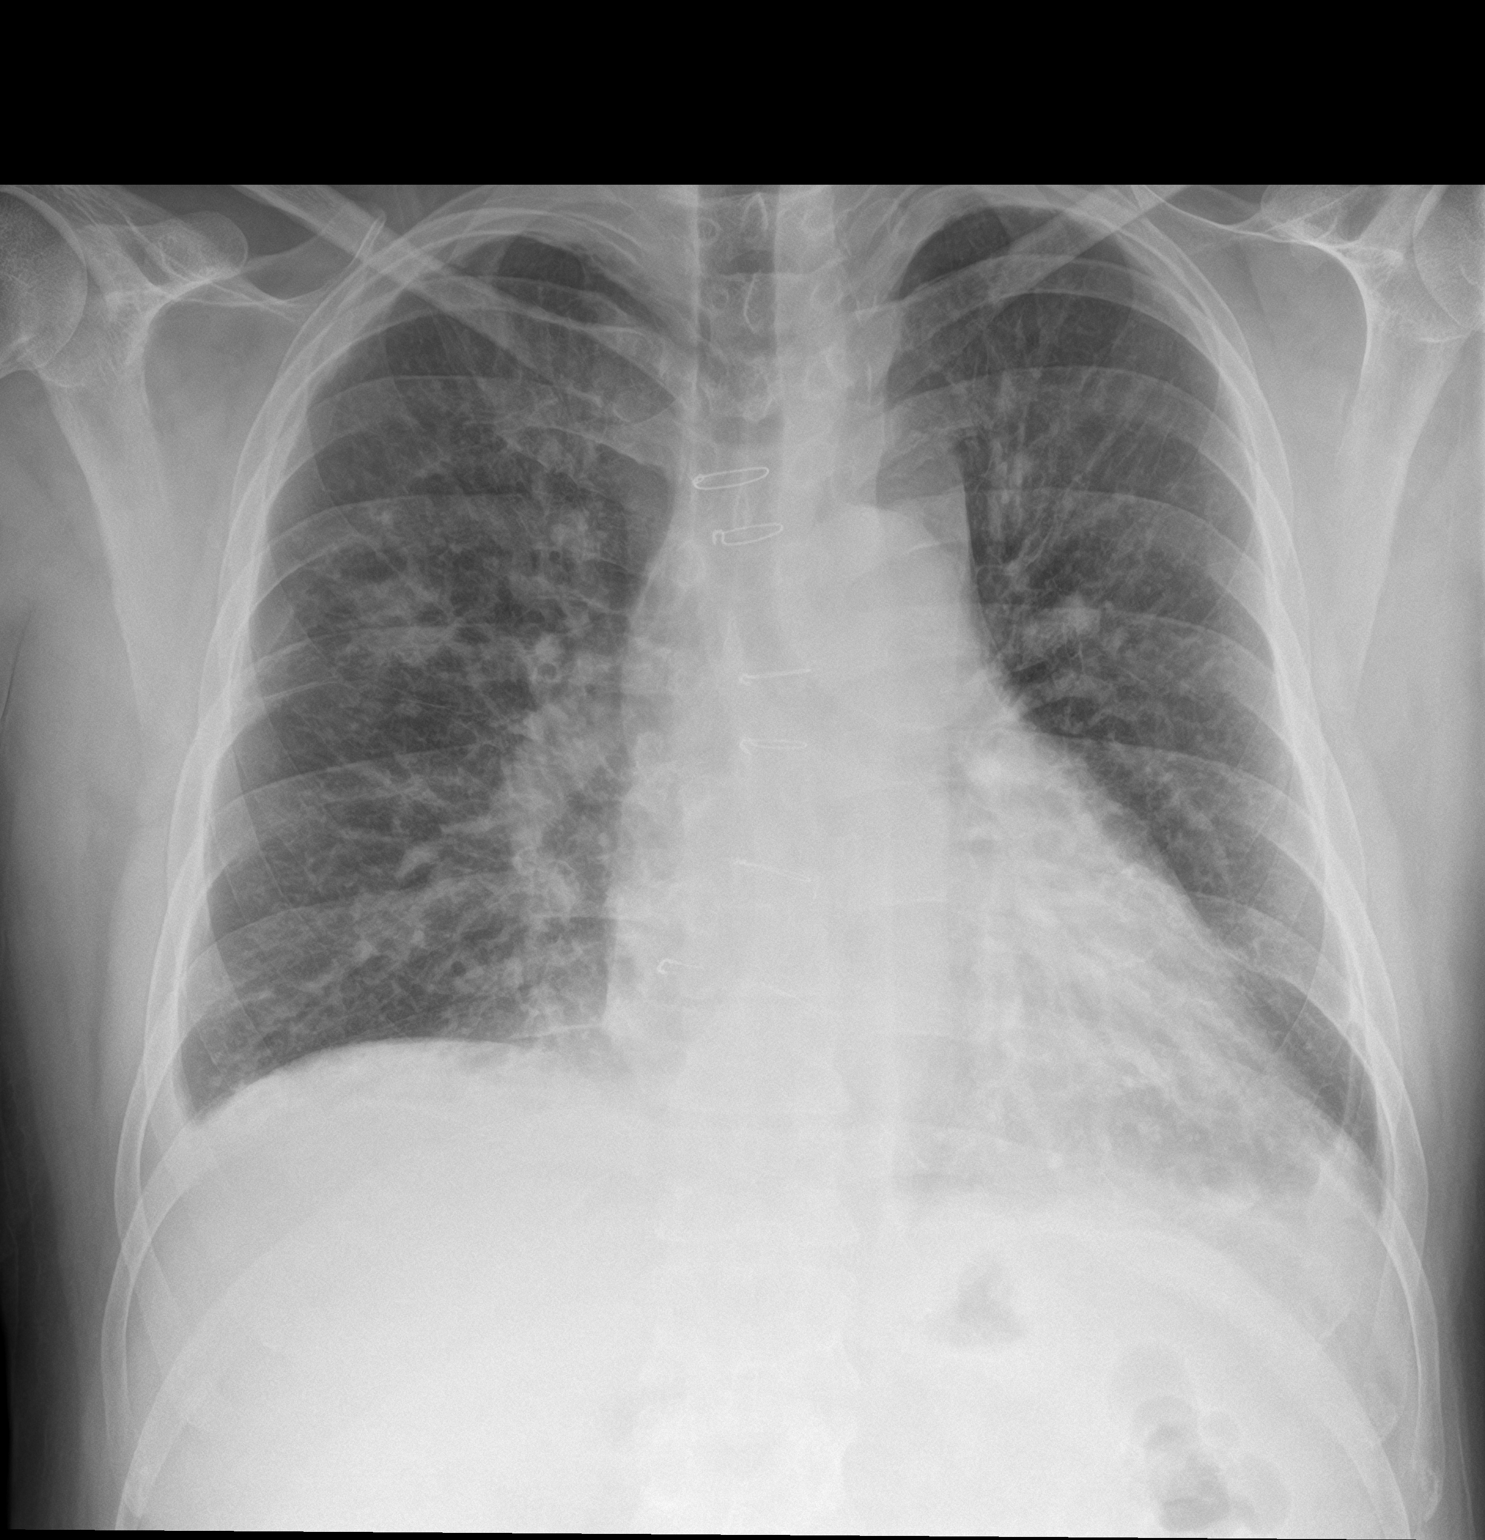

[chest lat]
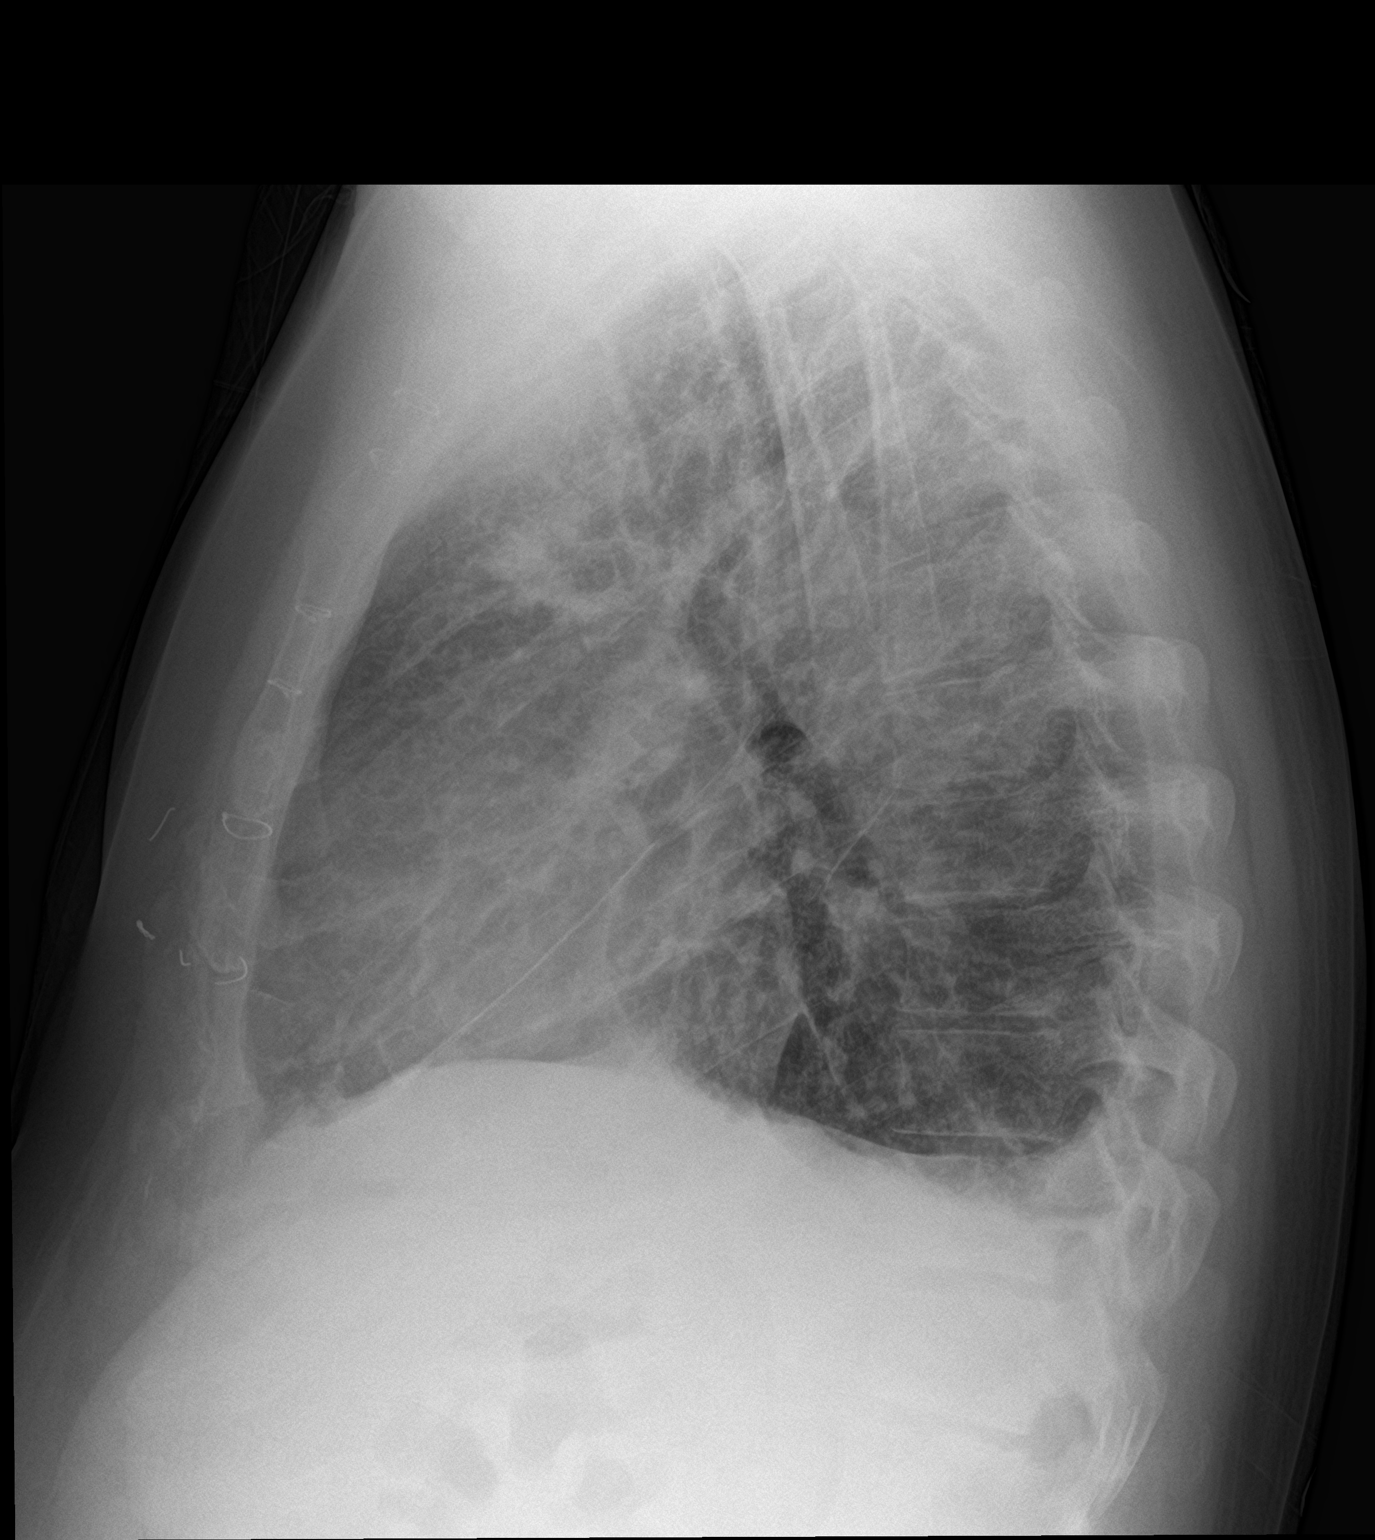

[2 of 2 positions shown; findings below may reference images not displayed]

FINDINGS: Stable cardiomegaly. New pulmonary vascular congestion and diffuse
interstitial thickening. Patchy opacities in the right upper and
lower lobes. Trace bilateral pleural effusions. No pneumothorax. No
acute osseous abnormality.
IMPRESSION: 1. New pulmonary vascular congestion and mild interstitial edema
with trace bilateral pleural effusions.
2. More focal opacities in the right upper and lower lobes may
reflect asymmetric alveolar edema versus superimposed infection.

## 2020-04-08 ENCOUNTER — Other Ambulatory Visit: Payer: Self-pay

## 2020-04-08 ENCOUNTER — Emergency Department (HOSPITAL_COMMUNITY): Payer: BC Managed Care – PPO

## 2020-04-08 ENCOUNTER — Emergency Department (HOSPITAL_COMMUNITY)
Admission: EM | Admit: 2020-04-08 | Discharge: 2020-04-08 | Disposition: A | Discharge status: Home / Self Care | Payer: BC Managed Care – PPO | Attending: Emergency Medicine | Admitting: Emergency Medicine

## 2020-04-08 ENCOUNTER — Encounter (HOSPITAL_COMMUNITY): Payer: Self-pay | Admitting: Emergency Medicine

## 2020-04-08 DIAGNOSIS — Z7901 Long term (current) use of anticoagulants: Secondary | ICD-10-CM | POA: Diagnosis not present

## 2020-04-08 DIAGNOSIS — Z79899 Other long term (current) drug therapy: Secondary | ICD-10-CM | POA: Insufficient documentation

## 2020-04-08 DIAGNOSIS — I4891 Unspecified atrial fibrillation: Secondary | ICD-10-CM

## 2020-04-08 DIAGNOSIS — I110 Hypertensive heart disease with heart failure: Secondary | ICD-10-CM | POA: Diagnosis not present

## 2020-04-08 DIAGNOSIS — Z96691 Finger-joint replacement of right hand: Secondary | ICD-10-CM | POA: Diagnosis not present

## 2020-04-08 DIAGNOSIS — I5022 Chronic systolic (congestive) heart failure: Secondary | ICD-10-CM | POA: Diagnosis not present

## 2020-04-08 DIAGNOSIS — R079 Chest pain, unspecified: Secondary | ICD-10-CM

## 2020-04-08 DIAGNOSIS — R000 Tachycardia, unspecified: Secondary | ICD-10-CM | POA: Diagnosis present

## 2020-04-08 LAB — BASIC METABOLIC PANEL
Anion gap: 10 (ref 5–15)
BUN: 7 mg/dL (ref 6–20)
CO2: 25 mmol/L (ref 22–32)
Calcium: 8.8 mg/dL — ABNORMAL LOW (ref 8.9–10.3)
Chloride: 105 mmol/L (ref 98–111)
Creatinine, Ser: 0.75 mg/dL (ref 0.61–1.24)
GFR calc Af Amer: >60 mL/min (ref >60)
GFR calc non Af Amer: >60 mL/min (ref >60)
Glucose, Bld: 129 mg/dL — ABNORMAL HIGH (ref 70–99)
Potassium: 3.9 mmol/L (ref 3.5–5.1)
Sodium: 140 mmol/L (ref 135–145)

## 2020-04-08 LAB — CBC
HCT: 44.8 % (ref 39.0–52.0)
Hemoglobin: 14.4 g/dL (ref 13.0–17.0)
MCH: 28.9 pg (ref 26.0–34.0)
MCHC: 32.1 g/dL (ref 30.0–36.0)
MCV: 90 fL (ref 80.0–100.0)
Platelets: 245 10*3/uL (ref 150–400)
RBC: 4.98 MIL/uL (ref 4.22–5.81)
RDW: 13 % (ref 11.5–15.5)
WBC: 6.7 10*3/uL (ref 4.0–10.5)
nRBC: 0 % (ref 0.0–0.2)

## 2020-04-08 LAB — TROPONIN I (HIGH SENSITIVITY): Troponin I (High Sensitivity): 7 ng/L (ref <18)

## 2020-04-08 MED ORDER — ETOMIDATE 2 MG/ML IV SOLN
10.0000 mg | Freq: Once | INTRAVENOUS | Status: AC
  Administered 2020-04-08: 10 mg via INTRAVENOUS
  Filled 2020-04-08: qty 10

## 2020-04-08 MED ORDER — FENTANYL CITRATE (PF) 100 MCG/2ML IJ SOLN
50.0000 ug | Freq: Once | INTRAMUSCULAR | Status: AC
  Administered 2020-04-08: 50 ug via INTRAVENOUS
  Filled 2020-04-08: qty 2

## 2020-04-08 MED ORDER — SODIUM CHLORIDE 0.9 % IV BOLUS
1000.0000 mL | Freq: Once | INTRAVENOUS | Status: AC
  Administered 2020-04-08: 1000 mL via INTRAVENOUS

## 2020-04-08 NOTE — Discharge Instructions (Addendum)
Follow-up with your cardiologist about further management of your arrhythmia.

## 2020-04-08 NOTE — Sedation Documentation (Signed)
150 J. Shock given.

## 2020-04-08 NOTE — ED Triage Notes (Signed)
Pt was mowing the grass and felt his heart racing. HR 180.

## 2020-04-08 NOTE — ED Provider Notes (Addendum)
[facility] EMERGENCY DEPARTMENT Provider Note   CSN: [id number] Arrival date & time: 04/08/20  1549     History Chief Complaint  Patient presents with  . Tachycardia    Matthew [name] is a 44 y.o. male.  HPI   Patient presents with fast heart rate.  Began around an hour prior to arrival.  Has a history of atrial fib/flutter.  On anticoagulation.  Appears to be on Eliquis.  States been taking consistently.  Sees cardiology both through [facility] and at [facility].  Previous history of transposition of the great vessels post surgery when he was a child.  Feels some chest tightness and shortness of breath began with the episode.  States that on Wednesday he had a second Covid vaccine.  States he was feeling little bit off on Thursday and today is Saturday. Past Medical History:  Diagnosis Date  . Alcohol abuse   . Anxiety   . Atypical atrial flutter ([place])   . Complete transposition of great vessels   . Depression   . Dyslipidemia   . GERD (gastroesophageal reflux disease)   . Headache    "weekly" (09/24/2014)  . Heart murmur   . Hypertension   . 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 SVT (supraventricular tachycardia) ([place]) "several times in the last year" (09/24/2014)  . 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 Right ventricular dysfunction 06/16/2019  . Dilated cardiomyopathy ([place]) 04/05/2018  . Congestive heart failure ([place]) 01/23/2018  . MDD (major depressive disorder), recurrent episode, moderate ([place]) 10/03/2017  . Abnormal EKG   . Nonspecific chest pain   . Hyponatremia 09/29/2017  . Transposition of great vessel 09/29/2017  . Dyslipidemia 09/29/2017  . GERD (gastroesophageal reflux disease) 09/29/2017  . Leukocytosis 09/29/2017  . Hyperbilirubinemia 09/29/2017  . Generalized anxiety disorder 09/18/2017  . Tachycardia 07/28/2014  . Hypocalcemia   . Transaminitis   . A-fib ([place])  04/21/2014  . Hx of complete transposition of great vessels 02/04/2014  . Atrial fibrillation ([place]) 02/03/2014  . Hallucination 01/04/2014  . Chronic systolic CHF (congestive heart failure) ([place]) 01/01/2014  . Pulmonary hypertension ([place]) 12/31/2013  . Metabolic acidosis 93/81/0175  . Protein-calorie malnutrition, severe ([place]) 12/30/2013  . Nausea vomiting and diarrhea 12/29/2013  . Atrial flutter ([place]) 12/29/2013  . Hypokalemia 12/29/2013  . Hypomagnesemia 12/29/2013  . Alcohol abuse 12/29/2013  . Alcohol withdrawal ([place]) 12/29/2013  . Dehydration with hyponatremia 12/29/2013  . Depression 12/29/2013  . Hyperglycemia 12/29/2013  . Melena 12/29/2013  . Elevated transaminase level 12/29/2013  . HYPERLIPIDEMIA-MIXED 04/21/2009  . Essential hypertension 04/21/2009  . OTHER DISORDERS PAPILLARY MUSCLE 01/24/2009  . 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 FINGER ARTHROPLASTY Right    volar plate arthroplasty, right small finger proxima linterphalangeal joint [Other]  . FRACTURE SURGERY    . SEPTOSTOMY     Rashkind balloon atrial septostomy   . TRANSPOSITION OF GREAT VESSELS REPAIR  1980   "mustard procedure"       Family History  Problem Relation Age of Onset  . Rheumatic fever Father   . Hypertension Father     Social History   Tobacco Use  . Smoking status: Never Smoker  . Smokeless tobacco: Current User    Types: Snuff  . Tobacco comment: "dipped when I played softball; none since 2011"  Vaping  Use  . Vaping Use: Every day  Substance Use Topics  . Alcohol use: Yes    Alcohol/week: 60.0 standard drinks    Types: 60 Shots of liquor per week    Comment: 09/24/2014 "1/5th vodka in 2 days; easy"  . Drug use: No    Home Medications Prior to Admission medications   Medication Sig Start Date End Date Taking? Authorizing Provider  acetaminophen (TYLENOL) 500 MG tablet Take 1,000 mg by mouth every 6  (six) hours as needed for headache (pain).   Yes [provider]  apixaban (ELIQUIS) 5 MG TABS tablet Take 1 tablet (5 mg total) by mouth 2 (two) times daily. 12/18/18  Yes Fay Records, MD  losartan (COZAAR) 25 MG tablet Take 25 mg by mouth daily. 03/10/20  Yes [provider]  metoprolol succinate (TOPROL-XL) 100 MG 24 hr tablet Take 100 mg by mouth daily. 03/19/20  Yes [provider]  escitalopram (LEXAPRO) 10 MG tablet Take 1 tablet by mouth once daily 04/20/19   Mikey Kirschner, MD  hydrOXYzine (ATARAX/VISTARIL) 25 MG tablet Take 1 tablet by mouth three times daily as needed 04/20/19   Mikey Kirschner, MD  losartan (COZAAR) 50 MG tablet Take 1 tablet (50 mg total) by mouth daily. 10/29/17 03/30/18  Leanor Kail, PA  metoprolol tartrate (LOPRESSOR) 25 MG tablet Take 50 mg (2 tabs) by mouth in AM and 25 mg (1 tab) by mouth in PM. Patient taking differently: Take 25-75 mg by mouth See admin instructions. Take 2 tablets (50 mg) by mouth every morning and 1 tablet (25 mg) at night, may take an additional tablet if needed for racing heart. 10/03/17   Kathie Dike, MD  Multiple Vitamin (MULTIVITAMIN WITH MINERALS) TABS tablet Take 1 tablet by mouth daily.    [provider]  sodium chloride (OCEAN) 0.65 % SOLN nasal spray Place 1 spray into both nostrils daily as needed for congestion.    [provider]    Allergies    Aspirin  Review of Systems   Review of Systems  Constitutional: Negative for appetite change.  HENT: Negative for congestion.   Respiratory: Positive for shortness of breath.   Cardiovascular: Positive for chest pain. Negative for leg swelling.  Gastrointestinal: Negative for abdominal pain.  Genitourinary: Negative for flank pain.  Musculoskeletal: Negative for back pain.  Skin: Negative for rash.  Neurological: Negative for weakness.  Psychiatric/Behavioral: Negative for confusion.    Physical Exam Updated Vital  Signs BP 95/69   Pulse (!) 57   Temp 97.8 F (36.6 C) (Oral)   Resp (!) 23   Ht 6\' 2"  (1.88 m)   Wt 99.3 kg   SpO2 97%   BMI 28.12 kg/m   Physical Exam Vitals and nursing note reviewed.  HENT:     Head: Normocephalic.  Eyes:     Pupils: Pupils are equal, round, and reactive to light.  Cardiovascular:     Rate and Rhythm: Tachycardia present. Rhythm irregular.  Pulmonary:     Breath sounds: No wheezing or rhonchi.  Abdominal:     Tenderness: There is no abdominal tenderness.  Musculoskeletal:     Cervical back: Neck supple.     Right lower leg: No edema.     Left lower leg: No edema.  Skin:    General: Skin is warm.     Capillary Refill: Capillary refill takes less than 2 seconds.  Neurological:     Mental Status: He is alert and  oriented to person, place, and time.  Psychiatric:        Mood and Affect: Mood normal.     ED Results / Procedures / Treatments   Labs (all labs ordered are listed, but only abnormal results are displayed) Labs Reviewed  BASIC METABOLIC PANEL - Abnormal; Notable for the following components:      Result Value   Glucose, Bld 129 (*)    Calcium 8.8 (*)    All other components within normal limits  CBC  TROPONIN I (HIGH SENSITIVITY)    EKG EKG Interpretation  Date/Time:  Saturday April 08 2020 18:16:10 EDT Ventricular Rate:  52 PR Interval:    QRS Duration: 96 QT Interval:  431 QTC Calculation: 401 R Axis:   30 Text Interpretation: Sinus rhythm Right ventricular hypertrophy Confirmed by Davonna Belling [phone]) on 04/08/2020 6:26:25 PM   Radiology No results found.  Procedures .Sedation  Date/Time: 04/08/2020 4:30 PM Performed by: Davonna Belling, MD Authorized by: Davonna Belling, MD   Universal protocol:    Procedure explained and questions answered to patient or proxy's satisfaction: yes     Immediately prior to procedure a time out was called: yes   Indications:    Procedure performed:   Cardioversion Pre-sedation assessment:    Time since last food or drink:  12 hrs   ASA classification: class 2 - patient with mild systemic disease     Neck mobility: normal     Mouth opening:  3 or more finger widths   Mallampati score:  II - soft palate, uvula, fauces visible   Pre-sedation assessments completed and reviewed: airway patency, cardiovascular function, hydration status, mental status and respiratory function     Pre-sedation assessments completed and reviewed: pre-procedure nausea and vomiting status not reviewed   Immediate pre-procedure details:    Reassessment: Patient reassessed immediately prior to procedure     Reviewed: vital signs   Procedure details (see MAR for exact dosages):    Preoxygenation:  Nasal cannula   Sedation:  Etomidate   Intended level of sedation: deep   Analgesia:  Fentanyl   Intra-procedure monitoring:  Blood pressure monitoring, cardiac monitor, continuous pulse oximetry, continuous capnometry and frequent LOC assessments   Intra-procedure events: none     Total Provider sedation time (minutes):  10 Post-procedure details:    Attendance: Constant attendance by certified staff until patient recovered     Recovery: Patient returned to pre-procedure baseline     Patient is stable for discharge or admission: yes     Patient tolerance:  Tolerated well, no immediate complications .Cardioversion  Date/Time: 04/08/2020 4:30 PM Performed by: Davonna Belling, MD Authorized by: Davonna Belling, MD   Consent:    Consent obtained:  Verbal   Risks discussed:  Cutaneous burn, induced arrhythmia, pain and death   Alternatives discussed:  Rate-control medication, no treatment and anti-coagulation medication Pre-procedure details:    Cardioversion basis:  Elective   Rhythm:  Atrial fibrillation Patient sedated: Yes. Refer to sedation procedure documentation for details of sedation.  Attempt one:    Cardioversion mode:  Synchronous   Waveform:   Biphasic   Shock (Joules):  150   Shock outcome:  Conversion to normal sinus rhythm Post-procedure details:    Patient status:  Awake   Patient tolerance of procedure:  Tolerated well, no immediate complications   (including critical care time)  Medications Ordered in ED Medications  etomidate (AMIDATE) injection 10 mg (10 mg Intravenous Given 04/08/20 1628)  fentaNYL (SUBLIMAZE)  injection 50 mcg (50 mcg Intravenous Given 04/08/20 1623)  sodium chloride 0.9 % bolus 1,000 mL (0 mLs Intravenous Stopped 04/08/20 1736)    ED Course  I have reviewed the triage vital signs and the nursing notes.  Pertinent labs & imaging results that were available during my care of the patient were reviewed by me and considered in my medical decision making (see chart for details).    MDM Rules/Calculators/A&P                          Patient presents with A. fib with RVR.  History of same.  On anticoagulation has been on it stably.  Cardioverted with good results.  Did have recent Covid vaccination but troponin negative doubt a direct cause of this.  Will follow with his cardiologist.  CRITICAL CARE Performed by: Davonna Belling Total critical care time: 30 minutes Critical care time was exclusive of separately billable procedures and treating other patients. Critical care was necessary to treat or prevent imminent or life-threatening deterioration. Critical care was time spent personally by me on the following activities: development of treatment plan with patient and/or surrogate as well as nursing, discussions with consultants, evaluation of patient's response to treatment, examination of patient, obtaining history from patient or surrogate, ordering and performing treatments and interventions, ordering and review of laboratory studies, ordering and review of radiographic studies, pulse oximetry and re-evaluation of patient's condition.  Final Clinical Impression(s) / ED Diagnoses Final diagnoses:   Atrial fibrillation with rapid ventricular response [facility])    Rx / DC Orders ED Discharge Orders    None       Davonna Belling, MD 04/08/20 2359    Davonna Belling, MD 05/02/20 [phone]

## 2020-04-08 NOTE — ED Triage Notes (Signed)
Pt presents today c/o racing heart, pain under his arm bilaterally and slightly lightheaded. Pt states he has a hx of a-fib. Denies any other sx

## 2020-05-18 ENCOUNTER — Telehealth: Payer: Self-pay

## 2020-05-18 MED ORDER — HYDROXYZINE HCL 25 MG PO TABS
25.0000 mg | ORAL_TABLET | Freq: Three times a day (TID) | ORAL | 1 refills | Status: DC | PRN
Start: 2020-05-18 — End: 2020-09-08

## 2020-05-18 NOTE — Telephone Encounter (Signed)
Refill sent in and pt is aware. Pt is going to call back for appt after he looks at work schedule.

## 2020-05-18 NOTE — Telephone Encounter (Signed)
Please advise. Thank you

## 2020-05-18 NOTE — Telephone Encounter (Signed)
Pt needs refill on hydrOXYzine (ATARAX/VISTARIL) 25 MG tablet [place], [place] - [address]   Pt call back [phone]

## 2020-05-18 NOTE — Telephone Encounter (Signed)
Pls give 30 tab and 1 refill of hydroxyzine.  And have pt f/u in next 45-60 days for f/u and establish care. Thx. Dr Lovena Le

## 2020-05-19 ENCOUNTER — Ambulatory Visit (INDEPENDENT_AMBULATORY_CARE_PROVIDER_SITE_OTHER): Payer: Self-pay | Admitting: Family Medicine

## 2020-05-19 ENCOUNTER — Other Ambulatory Visit: Payer: Self-pay

## 2020-05-19 ENCOUNTER — Encounter: Payer: Self-pay | Admitting: Family Medicine

## 2020-05-19 VITALS — HR 55 | Temp 97.5°F | Resp 16

## 2020-05-19 DIAGNOSIS — J019 Acute sinusitis, unspecified: Secondary | ICD-10-CM | POA: Insufficient documentation

## 2020-05-19 DIAGNOSIS — J029 Acute pharyngitis, unspecified: Secondary | ICD-10-CM

## 2020-05-19 LAB — POCT RAPID STREP A (OFFICE): Rapid Strep A Screen: NEGATIVE

## 2020-05-19 MED ORDER — AMOXICILLIN-POT CLAVULANATE 875-125 MG PO TABS: 1.0000 | ORAL_TABLET | Freq: Two times a day (BID) | ORAL | 0 refills | Status: DC

## 2020-05-19 NOTE — Progress Notes (Signed)
Patient ID: Matthew [name], male    DOB: 01/04/1976, 44 y.o.   MRN: [id number]   Chief Complaint  Patient presents with  . Sinusitis   Subjective:  CC: sinus pain and pressure and headache  Presents today for an outside visit with a complaint of pressure behind his eyes and sinuses.  This is a new problem, symptoms started on Wednesday with headache.  Associated symptoms include congestion, and sore throat for a couple hours.  Pertinent negatives include no fever, no chills, no body aches, no ear pain, no abdominal pain and no urinary symptoms.  He is on metoprolol, managed by physicians at [facility], and had a reaction to his second Covid vaccine.   sinus pressure, headache and congestion.  Started 2 days ago.    Medical History Matthew [name] has a past medical history of Alcohol abuse, Anxiety, Atypical atrial flutter ([place]), Complete transposition of great vessels, Depression, Dyslipidemia, GERD (gastroesophageal reflux disease), Headache, Heart murmur, Hypertension, [facility] spotted fever (11/2013), SVT (supraventricular tachycardia) ([facility]) ("several times in the last year" (09/24/2014)), and Transposition of great vessel.   Outpatient Encounter Medications as of 05/19/2020  Medication Sig  . acetaminophen (TYLENOL) 500 MG tablet Take 1,000 mg by mouth every 6 (six) hours as needed for headache (pain).  Marland Kitchen apixaban (ELIQUIS) 5 MG TABS tablet Take 1 tablet (5 mg total) by mouth 2 (two) times daily.  . hydrOXYzine (ATARAX/VISTARIL) 25 MG tablet Take 1 tablet (25 mg total) by mouth 3 (three) times daily as needed.  Marland Kitchen losartan (COZAAR) 25 MG tablet Take 25 mg by mouth daily.  . metoprolol succinate (TOPROL-XL) 100 MG 24 hr tablet Take 100 mg by mouth daily.  . Multiple Vitamin (MULTIVITAMIN WITH MINERALS) TABS tablet Take 1 tablet by mouth daily.  . sodium chloride (OCEAN) 0.65 % SOLN nasal spray Place 1 spray into both nostrils daily as needed for congestion.  . [DISCONTINUED] escitalopram  (LEXAPRO) 10 MG tablet Take 1 tablet by mouth once daily  . amoxicillin-clavulanate (AUGMENTIN) 875-125 MG tablet Take 1 tablet by mouth 2 (two) times daily.  Marland Kitchen losartan (COZAAR) 50 MG tablet Take 1 tablet (50 mg total) by mouth daily.  . metoprolol tartrate (LOPRESSOR) 25 MG tablet Take 50 mg (2 tabs) by mouth in AM and 25 mg (1 tab) by mouth in PM. (Patient not taking: Reported on 05/19/2020)   No facility-administered encounter medications on file as of 05/19/2020.     Review of Systems  Constitutional: Negative for chills and fever.  HENT: Positive for congestion, sinus pressure, sinus pain and sore throat.   Respiratory: Negative for shortness of breath.   Cardiovascular: Negative for chest pain.  Gastrointestinal: Negative for abdominal pain, diarrhea, nausea and vomiting.  Neurological: Positive for headaches.       From sinus pain and pressure     Vitals Pulse (!) 55   Temp (!) 97.5 F (36.4 C)   Resp 16   SpO2 95%   Objective:   Physical Exam Vitals and nursing note reviewed.  HENT:     Nose:     Right Sinus: Maxillary sinus tenderness and frontal sinus tenderness present.     Left Sinus: Maxillary sinus tenderness and frontal sinus tenderness present.     Mouth/Throat:     Mouth: Mucous membranes are moist.     Pharynx: Posterior oropharyngeal erythema present. No oropharyngeal exudate.     Tonsils: 0 on the right. 1+ on the left.  Cardiovascular:  Rate and Rhythm: Bradycardia present.     Heart sounds: Normal heart sounds, S1 normal and S2 normal.     Comments: On beta blocker, evaluated by [facility] recently for bradycardia.  Pulmonary:     Effort: Pulmonary effort is normal.     Breath sounds: Normal breath sounds.  Musculoskeletal:        General: Normal range of motion.  Skin:    General: Skin is warm and dry.  Neurological:     Mental Status: He is oriented to person, place, and time.    Results for orders placed or performed in visit on 05/19/20    POCT rapid strep A  Result Value Ref Range   Rapid Strep A Screen Negative Negative     Assessment and Plan   1. Acute rhinosinusitis - amoxicillin-clavulanate (AUGMENTIN) 875-125 MG tablet; Take 1 tablet by mouth 2 (two) times daily.  Dispense: 20 tablet; Refill: 0  2. Sore throat - Strep A DNA probe - POCT rapid strep A   Rapid strep negative.  Due to significant maxillary and frontal sinus tenderness and headache, will treat for acute rhinosinusitis.  He is instructed to take the antibiotic twice daily for 10 days.  Agrees with plan of care discussed today. Understands warning signs to seek further care: Fever, chills, nausea vomiting diarrhea, any significant changes in health status. Understands to follow-up if symptoms do not improve.  Work note provided for yesterday and today.  Pecolia Ades, FNP-C 05/19/2020

## 2020-05-20 LAB — STREP A DNA PROBE: Strep Gp A Direct, DNA Probe: NEGATIVE

## 2020-09-08 ENCOUNTER — Other Ambulatory Visit: Payer: Self-pay

## 2020-09-08 ENCOUNTER — Encounter: Payer: Self-pay | Admitting: Family Medicine

## 2020-09-08 ENCOUNTER — Ambulatory Visit: Payer: 59 | Admitting: Family Medicine

## 2020-09-08 VITALS — BP 126/84 | HR 88 | Temp 97.3°F | Ht 74.0 in | Wt 219.8 lb

## 2020-09-08 DIAGNOSIS — F411 Generalized anxiety disorder: Secondary | ICD-10-CM | POA: Diagnosis not present

## 2020-09-08 DIAGNOSIS — I5022 Chronic systolic (congestive) heart failure: Secondary | ICD-10-CM | POA: Diagnosis not present

## 2020-09-08 DIAGNOSIS — I4891 Unspecified atrial fibrillation: Secondary | ICD-10-CM

## 2020-09-08 DIAGNOSIS — F331 Major depressive disorder, recurrent, moderate: Secondary | ICD-10-CM | POA: Diagnosis not present

## 2020-09-08 DIAGNOSIS — I10 Essential (primary) hypertension: Secondary | ICD-10-CM

## 2020-09-08 MED ORDER — HYDROXYZINE HCL 25 MG PO TABS
25.0000 mg | ORAL_TABLET | Freq: Three times a day (TID) | ORAL | 1 refills | Status: DC | PRN
Start: 2020-09-08 — End: 2020-11-22

## 2020-09-08 MED ORDER — ESCITALOPRAM OXALATE 10 MG PO TABS: 10.0000 mg | ORAL_TABLET | Freq: Every day | ORAL | 1 refills | Status: DC

## 2020-09-08 NOTE — Progress Notes (Signed)
Patient ID: Matthew [name], male    DOB: [birth year], 45 y.o.   MRN: [id number]   Chief Complaint  Patient presents with  . Hypertension    Follow up  . Anxiety   Subjective:    HPI  Cc- f/u htn, anxiety.  Pt having h/o anxiety and depression.  Irritable and anxious.  Had it past.  Noticing in past month it's returning.  Stating having more work stress.  H/o open heart surgery when born.  And had surgeries and h/o heart problems. In past was on hydroxyzine and lexapro 10mg . Was sober 3 yrs in march.  Only needing hydroxyzine 1x per day for anxiety. 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  Working in [employer] in [employer] and short handed.  Seeing specialist with cards at [facility] and getting MRI on heart, seeing them in 6/22. Had a reaction to covid vaccine 2nd shot. Then had rapid rate, 160s.  H/o afib. Had some SVT from the covid vaccines.  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  Medical History Matthew [name] has a past medical history of Alcohol abuse, Anxiety, Atypical atrial flutter ([place]), Complete transposition of great vessels, Depression, Dyslipidemia, GERD (gastroesophageal reflux disease), Headache, Heart murmur, Hypertension, [facility] spotted fever (11/2013), SVT (supraventricular tachycardia) ([facility]) ("several times in the last year" (09/24/2014)), and Transposition of great vessel.   Outpatient Encounter Medications as of 09/08/2020  Medication Sig  . escitalopram (LEXAPRO) 10 MG tablet Take 1 tablet (10 mg total) by mouth daily.  Marland Kitchen acetaminophen (TYLENOL) 500 MG tablet Take 1,000 mg by mouth every 6 (six) hours as needed for headache (pain).  Marland Kitchen apixaban (ELIQUIS) 5 MG TABS tablet Take 1 tablet (5 mg total) by mouth 2 (two) times daily.  . hydrOXYzine (ATARAX/VISTARIL) 25 MG tablet Take 1 tablet (25 mg total) by mouth 3 (three) times daily as needed.  Marland Kitchen losartan (COZAAR) 25 MG tablet Take 25 mg by mouth daily.  . metoprolol succinate (TOPROL-XL) 100 MG 24 hr  tablet Take 100 mg by mouth daily.  . metoprolol tartrate (LOPRESSOR) 25 MG tablet Take 50 mg (2 tabs) by mouth in AM and 25 mg (1 tab) by mouth in PM.  . Multiple Vitamin (MULTIVITAMIN WITH MINERALS) TABS tablet Take 1 tablet by mouth daily.  . sodium chloride (OCEAN) 0.65 % SOLN nasal spray Place 1 spray into both nostrils daily as needed for congestion.  . [DISCONTINUED] amoxicillin-clavulanate (AUGMENTIN) 875-125 MG tablet Take 1 tablet by mouth 2 (two) times daily. (Patient not taking: Reported on 09/08/2020)  . [DISCONTINUED] hydrOXYzine (ATARAX/VISTARIL) 25 MG tablet Take 1 tablet (25 mg total) by mouth 3 (three) times daily as needed.  . [DISCONTINUED] losartan (COZAAR) 50 MG tablet Take 1 tablet (50 mg total) by mouth daily.   No facility-administered encounter medications on file as of 09/08/2020.     Review of Systems  Constitutional: Negative for chills and fever.  HENT: Negative for congestion, rhinorrhea and sore throat.   Respiratory: Negative for cough, shortness of breath and wheezing.   Cardiovascular: Negative for chest pain and leg swelling.  Gastrointestinal: Negative for abdominal pain, diarrhea, nausea and vomiting.  Genitourinary: Negative for dysuria and frequency.  Skin: Negative for rash.  Neurological: Negative for dizziness, weakness and headaches.  Psychiatric/Behavioral: Positive for agitation. Negative for dysphoric mood, hallucinations, self-injury, sleep disturbance and suicidal ideas. The patient is nervous/anxious.      Vitals BP 126/84   Pulse 88   Temp (!) 97.3 F (36.3 C) (  Oral)   Ht 6\' 2"  (1.88 m)   Wt 219 lb 12.8 oz (99.7 kg)   SpO2 99%   BMI 28.22 kg/m   Objective:   Physical Exam Vitals and nursing note reviewed.  Constitutional:      General: He is not in acute distress.    Appearance: Normal appearance. He is not ill-appearing.  HENT:     Head: Normocephalic.  Eyes:     Extraocular Movements: Extraocular movements intact.      Conjunctiva/sclera: Conjunctivae normal.     Pupils: Pupils are equal, round, and reactive to light.  Cardiovascular:     Rate and Rhythm: Normal rate and regular rhythm.     Pulses: Normal pulses.     Heart sounds: Normal heart sounds. No murmur heard.   Pulmonary:     Effort: Pulmonary effort is normal.     Breath sounds: Normal breath sounds. No wheezing, rhonchi or rales.  Musculoskeletal:        General: Normal range of motion.     Right lower leg: No edema.     Left lower leg: No edema.  Skin:    General: Skin is warm and dry.     Findings: No rash.  Neurological:     General: No focal deficit present.     Mental Status: He is alert and oriented to person, place, and time.     Cranial Nerves: No cranial nerve deficit.  Psychiatric:        Mood and Affect: Mood normal.        Behavior: Behavior normal.        Thought Content: Thought content normal.        Judgment: Judgment normal.      Assessment and Plan   1. Generalized anxiety disorder  2. Atrial fibrillation, unspecified type ([place])  3. Chronic systolic CHF (congestive heart failure) ([place])  4. MDD (major depressive disorder), recurrent episode, moderate ([place])  5. Essential hypertension    Anxiety/depression-restarting lexapro 10mg  and hydroxyzine 25mg  tid prn.  Chf, afib, and h/o cardiomyopathy- stable, cont f/u with cards at [facility].  Afib- cont eliquis and metoprolol.   If worsening sympotms to call or rto. Pt in agreement.  Return in about 4 weeks (around 10/06/2020) for f/u anxiety. Phone visit.

## 2020-10-05 ENCOUNTER — Other Ambulatory Visit: Payer: Self-pay

## 2020-10-05 ENCOUNTER — Telehealth (INDEPENDENT_AMBULATORY_CARE_PROVIDER_SITE_OTHER): Payer: 59 | Admitting: Family Medicine

## 2020-10-05 DIAGNOSIS — F419 Anxiety disorder, unspecified: Secondary | ICD-10-CM

## 2020-10-05 MED ORDER — ESCITALOPRAM OXALATE 10 MG PO TABS: 10.0000 mg | ORAL_TABLET | Freq: Every day | ORAL | 2 refills | Status: DC

## 2020-10-05 NOTE — Progress Notes (Signed)
Patient ID: Matthew [name], male    DOB: 03/02/1976, 45 y.o.   MRN: [id number]  Virtual Visit via Telephone Note  I connected with [name] on 10/05/20 at  1:10 PM EDT by telephone and verified that I am speaking with the correct person using two identifiers.  Location: Patient: home Provider: office   I discussed the limitations, risks, security and privacy concerns of performing an evaluation and management service by telephone and the availability of in person appointments. I also discussed with the patient that there may be a patient responsible charge related to this service. The patient expressed understanding and agreed to proceed.   Chief Complaint  Patient presents with  . Anxiety   Subjective:    HPI  follow up on anxiety. Gad 7 done.   Had diarrhea for about 1 wk during the start of taking lexapro, but resolved. Taking lexapro 10mg  daily. Not sure if got it from his kids had diarrhea.  Taking hydroxyzine prn.  Not taking it daily.   Medical History Peirce has a past medical history of Alcohol abuse, Anxiety, Atypical atrial flutter ([place]), Complete transposition of great vessels, Depression, Dyslipidemia, GERD (gastroesophageal reflux disease), Headache, Heart murmur, Hypertension, [facility] spotted fever (11/2013), SVT (supraventricular tachycardia) (HCC) ("several times in the last year" (09/24/2014)), and Transposition of great vessel.   Outpatient Encounter Medications as of 10/05/2020  Medication Sig  . acetaminophen (TYLENOL) 500 MG tablet Take 1,000 mg by mouth every 6 (six) hours as needed for headache (pain).  Marland Kitchen apixaban (ELIQUIS) 5 MG TABS tablet Take 1 tablet (5 mg total) by mouth 2 (two) times daily.  . hydrOXYzine (ATARAX/VISTARIL) 25 MG tablet Take 1 tablet (25 mg total) by mouth 3 (three) times daily as needed.  Marland Kitchen losartan (COZAAR) 25 MG tablet Take 25 mg by mouth daily.  . metoprolol succinate (TOPROL-XL) 100 MG 24 hr tablet Take  100 mg by mouth daily.  . Multiple Vitamin (MULTIVITAMIN WITH MINERALS) TABS tablet Take 1 tablet by mouth daily.  . sodium chloride (OCEAN) 0.65 % SOLN nasal spray Place 1 spray into both nostrils daily as needed for congestion.  . [DISCONTINUED] escitalopram (LEXAPRO) 10 MG tablet Take 1 tablet (10 mg total) by mouth daily.  Derrill Memo ON 10/27/2020] escitalopram (LEXAPRO) 10 MG tablet Take 1 tablet (10 mg total) by mouth daily.  . metoprolol tartrate (LOPRESSOR) 25 MG tablet Take 50 mg (2 tabs) by mouth in AM and 25 mg (1 tab) by mouth in PM. (Patient not taking: Reported on 10/05/2020)   No facility-administered encounter medications on file as of 10/05/2020.     Review of Systems  Constitutional: Negative for chills and fever.  HENT: Negative for congestion, rhinorrhea and sore throat.   Respiratory: Negative for cough, shortness of breath and wheezing.   Cardiovascular: Negative for chest pain and leg swelling.  Gastrointestinal: Negative for abdominal pain, diarrhea, nausea and vomiting.  Genitourinary: Negative for dysuria and frequency.  Skin: Negative for rash.  Neurological: Negative for dizziness, weakness and headaches.  Psychiatric/Behavioral: Negative for dysphoric mood, self-injury, sleep disturbance and suicidal ideas. The patient is not nervous/anxious.      Vitals There were no vitals taken for this visit.  Objective:   Physical Exam  Assessment and Plan   1. Anxiety - escitalopram (LEXAPRO) 10 MG tablet; Take 1 tablet (10 mg total) by mouth daily.  Dispense: 30 tablet; Refill: 2    Cont with hydroxyzine prn and lexapro.  Call  if worsening symptoms.   Return in about 3 months (around 01/05/2021) for f/u anxiety.   Follow Up Instructions:    I discussed the assessment and treatment plan with the patient. The patient was provided an opportunity to ask questions and all were answered. The patient agreed with the plan and demonstrated an understanding of the  instructions.   The patient was advised to call back or seek an in-person evaluation if the symptoms worsen or if the condition fails to improve as anticipated.  I provided 15 minutes of non-face-to-face time during this encounter.

## 2020-11-21 ENCOUNTER — Other Ambulatory Visit: Payer: Self-pay | Admitting: Family Medicine

## 2021-10-30 ENCOUNTER — Encounter: Payer: Self-pay | Admitting: Family Medicine

## 2021-10-30 ENCOUNTER — Ambulatory Visit: Payer: 59 | Admitting: Family Medicine

## 2021-10-30 VITALS — BP 132/78 | HR 96 | Temp 97.9°F | Wt 258.0 lb

## 2021-10-30 DIAGNOSIS — R739 Hyperglycemia, unspecified: Secondary | ICD-10-CM | POA: Diagnosis not present

## 2021-10-30 DIAGNOSIS — I10 Essential (primary) hypertension: Secondary | ICD-10-CM

## 2021-10-30 DIAGNOSIS — Z7901 Long term (current) use of anticoagulants: Secondary | ICD-10-CM | POA: Diagnosis not present

## 2021-10-30 DIAGNOSIS — F419 Anxiety disorder, unspecified: Secondary | ICD-10-CM

## 2021-10-30 DIAGNOSIS — E785 Hyperlipidemia, unspecified: Secondary | ICD-10-CM

## 2021-10-30 DIAGNOSIS — F32A Depression, unspecified: Secondary | ICD-10-CM | POA: Insufficient documentation

## 2021-10-30 MED ORDER — HYDROXYZINE PAMOATE 25 MG PO CAPS: 25.0000 mg | ORAL_CAPSULE | Freq: Three times a day (TID) | ORAL | 6 refills | Status: DC | PRN

## 2021-10-30 MED ORDER — ESCITALOPRAM OXALATE 10 MG PO TABS: 10.0000 mg | ORAL_TABLET | Freq: Every day | ORAL | 1 refills | Status: DC

## 2021-10-30 NOTE — Patient Instructions (Signed)
Medication as prescribed. ? ?Labs today. ? ?Follow up in 6 weeks. ? ?Take care ? ?Dr. Lacinda Axon  ?

## 2021-10-30 NOTE — Assessment & Plan Note (Signed)
Uncontrolled/worsening (exacerbation). ?Starting on Lexapro and hydroxyzine ?

## 2021-10-30 NOTE — Progress Notes (Signed)
? ?Subjective:  ?Patient ID: Matthew [name], male    DOB: 1975-08-18  Age: 46 y.o. MRN: [id number] ? ?CC: ?Chief Complaint  ?Patient presents with  ? Establish Care  ?  Discuss anxiety and depression. Has worsened over the past week or son  ? ? ?HPI: ? 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 ? ?Patient is followed at [facility] adult congenital heart disease service/clinic. ? ?Patient reports that over the past 2 weeks he has had worsening anxiety and depression.  Has a history of anxiety and depression.  Was previously on Lexapro and Atarax and was doing well.  He states that over the past 2 weeks he has had an increase in stress.  He states that he was recently alone while his kids went with her mother.  He has a lot of stress due to the fact that he is a single father of 3 he works quite a bit.  PHQ-9 score of 15 and GAD-7 score of 13. ? ?Patient Active Problem List  ? Diagnosis Date Noted  ? Anxiety and depression 10/30/2021  ? Dyslipidemia 09/29/2017  ? Hx of complete transposition of great vessels 02/04/2014  ? Atrial fibrillation ([place]) 02/03/2014  ? Chronic systolic CHF (congestive heart failure) ([place]) 01/01/2014  ? Pulmonary hypertension ([place]) 12/31/2013  ? Essential hypertension 04/21/2009  ? ? ?Social Hx   ?Social History  ? ?Socioeconomic History  ? Marital status: Single  ?  Spouse name: Not on file  ? Number of children: Not on file  ? Years of education: Not on file  ? Highest education level: Not on file  ?Occupational History  ? Not on file  ?Tobacco Use  ? Smoking status: Never  ? Smokeless tobacco: Current  ?  Types: Snuff  ? Tobacco comments:  ?  "dipped when I played softball; none since 2011"  ?Vaping Use  ? Vaping Use: Every day  ?Substance and Sexual Activity  ? Alcohol use: Yes  ?  Alcohol/week: 60.0 standard drinks  ?  Types: 60 Shots of liquor per  week  ?  Comment: 09/24/2014 "1/5th vodka in 2 days; easy"  ? Drug use: No  ? Sexual activity: Yes  ?Other Topics Concern  ? Not on file  ?Social History Narrative  ? Not on file  ? ?Social Determinants of Health  ? ?Financial Resource Strain: Not on file  ?Food Insecurity: Not on file  ?Transportation Needs: Not on file  ?Physical Activity: Not on file  ?Stress: Not on file  ?Social Connections: Not on file  ? ? ?Review of Systems  ?Cardiovascular: Negative.   ?Psychiatric/Behavioral:    ?     Anxiety and depression.  ? ? ?Objective:  ?BP 132/78   Pulse 96   Temp 97.9 ?F (36.6 ?C)   Wt 258 lb (117 kg)   SpO2 96%   BMI 33.13 kg/m?  ? ? ?  10/30/2021  ?  8:30 AM 09/08/2020  ? 10:54 AM 04/08/2020  ?  6:15 PM  ?BP/Weight  ?Systolic BP 539 767 95  ?Diastolic BP 78 84 69  ?Wt. (Lbs) 258 219.8   ?BMI 33.13 kg/m2 28.22 kg/m2   ? ? ?Physical Exam ?Vitals and nursing note reviewed.  ?Constitutional:   ?   General: He is not in acute distress. ?   Appearance: Normal appearance. He is not ill-appearing.  ?HENT:  ?  Head: Normocephalic and atraumatic.  ?Eyes:  ?   General:     ?   Right eye: No discharge.     ?   Left eye: No discharge.  ?   Conjunctiva/sclera: Conjunctivae normal.  ?Cardiovascular:  ?   Rate and Rhythm: Normal rate and regular rhythm.  ?Pulmonary:  ?   Effort: Pulmonary effort is normal.  ?   Breath sounds: Normal breath sounds. No wheezing or rales.  ?Neurological:  ?   Mental Status: He is alert.  ?Psychiatric:     ?   Mood and Affect: Mood normal.     ?   Behavior: Behavior normal.  ? ? ?Lab Results  ?Component Value Date  ? WBC 6.7 04/08/2020  ? HGB 14.4 04/08/2020  ? HCT 44.8 04/08/2020  ? PLT 245 04/08/2020  ? GLUCOSE 129 (H) 04/08/2020  ? CHOL 160 10/13/2015  ? TRIG 49 10/13/2015  ? HDL 39 (L) 10/13/2015  ? LDLDIRECT 162.4 08/10/2012  ? Max 111 10/13/2015  ? ALT 15 (L) 11/11/2017  ? AST 16 11/11/2017  ? NA 140 04/08/2020  ? K 3.9 04/08/2020  ? CL 105 04/08/2020  ? CREATININE 0.75 04/08/2020  ? BUN  7 04/08/2020  ? CO2 25 04/08/2020  ? TSH 0.639 04/21/2014  ? INR 2.4 12/18/2018  ? HGBA1C 5.1 07/29/2014  ? ? ? ?Assessment & Plan:  ? ?Problem List Items Addressed This Visit   ? ?  ? Cardiovascular and Mediastinum  ? Essential hypertension  ? Relevant Orders  ? CMP14+EGFR  ?  ? Other  ? Dyslipidemia (Chronic)  ? Relevant Orders  ? Lipid panel  ? Anxiety and depression - Primary  ?  Uncontrolled/worsening (exacerbation). ?Starting on Lexapro and hydroxyzine ? ?  ?  ? Relevant Medications  ? escitalopram (LEXAPRO) 10 MG tablet  ? hydrOXYzine (VISTARIL) 25 MG capsule  ? ?Other Visit Diagnoses   ? ? Chronic anticoagulation      ? Relevant Orders  ? CBC  ? Blood glucose elevated      ? Relevant Orders  ? Hemoglobin A1c  ? ?  ? ? ?Meds ordered this encounter  ?Medications  ? escitalopram (LEXAPRO) 10 MG tablet  ?  Sig: Take 1 tablet (10 mg total) by mouth daily.  ?  Dispense:  90 tablet  ?  Refill:  1  ? hydrOXYzine (VISTARIL) 25 MG capsule  ?  Sig: Take 1 capsule (25 mg total) by mouth every 8 (eight) hours as needed for anxiety.  ?  Dispense:  30 capsule  ?  Refill:  6  ? ? ?Follow-up:  Return in about 6 weeks (around 12/11/2021). ? ?Thersa Salt DO ?[place] ? ?

## 2021-12-12 ENCOUNTER — Encounter: Payer: Self-pay | Admitting: Family Medicine

## 2021-12-12 ENCOUNTER — Ambulatory Visit: Payer: 59 | Admitting: Family Medicine

## 2022-04-25 ENCOUNTER — Other Ambulatory Visit: Payer: Self-pay | Admitting: Family Medicine

## 2022-05-13 IMAGING — DX DG CHEST 1V PORT
1 series · 2 of 2 positions shown · non-contrast
Comparison: April 12, 2019

CLINICAL DATA: Tachycardia.

EXAM:
PORTABLE CHEST 1 VIEW

[Series 1: chest ap · 0.14mm/px · 2 of 2 slices shown]
[im 1/2]
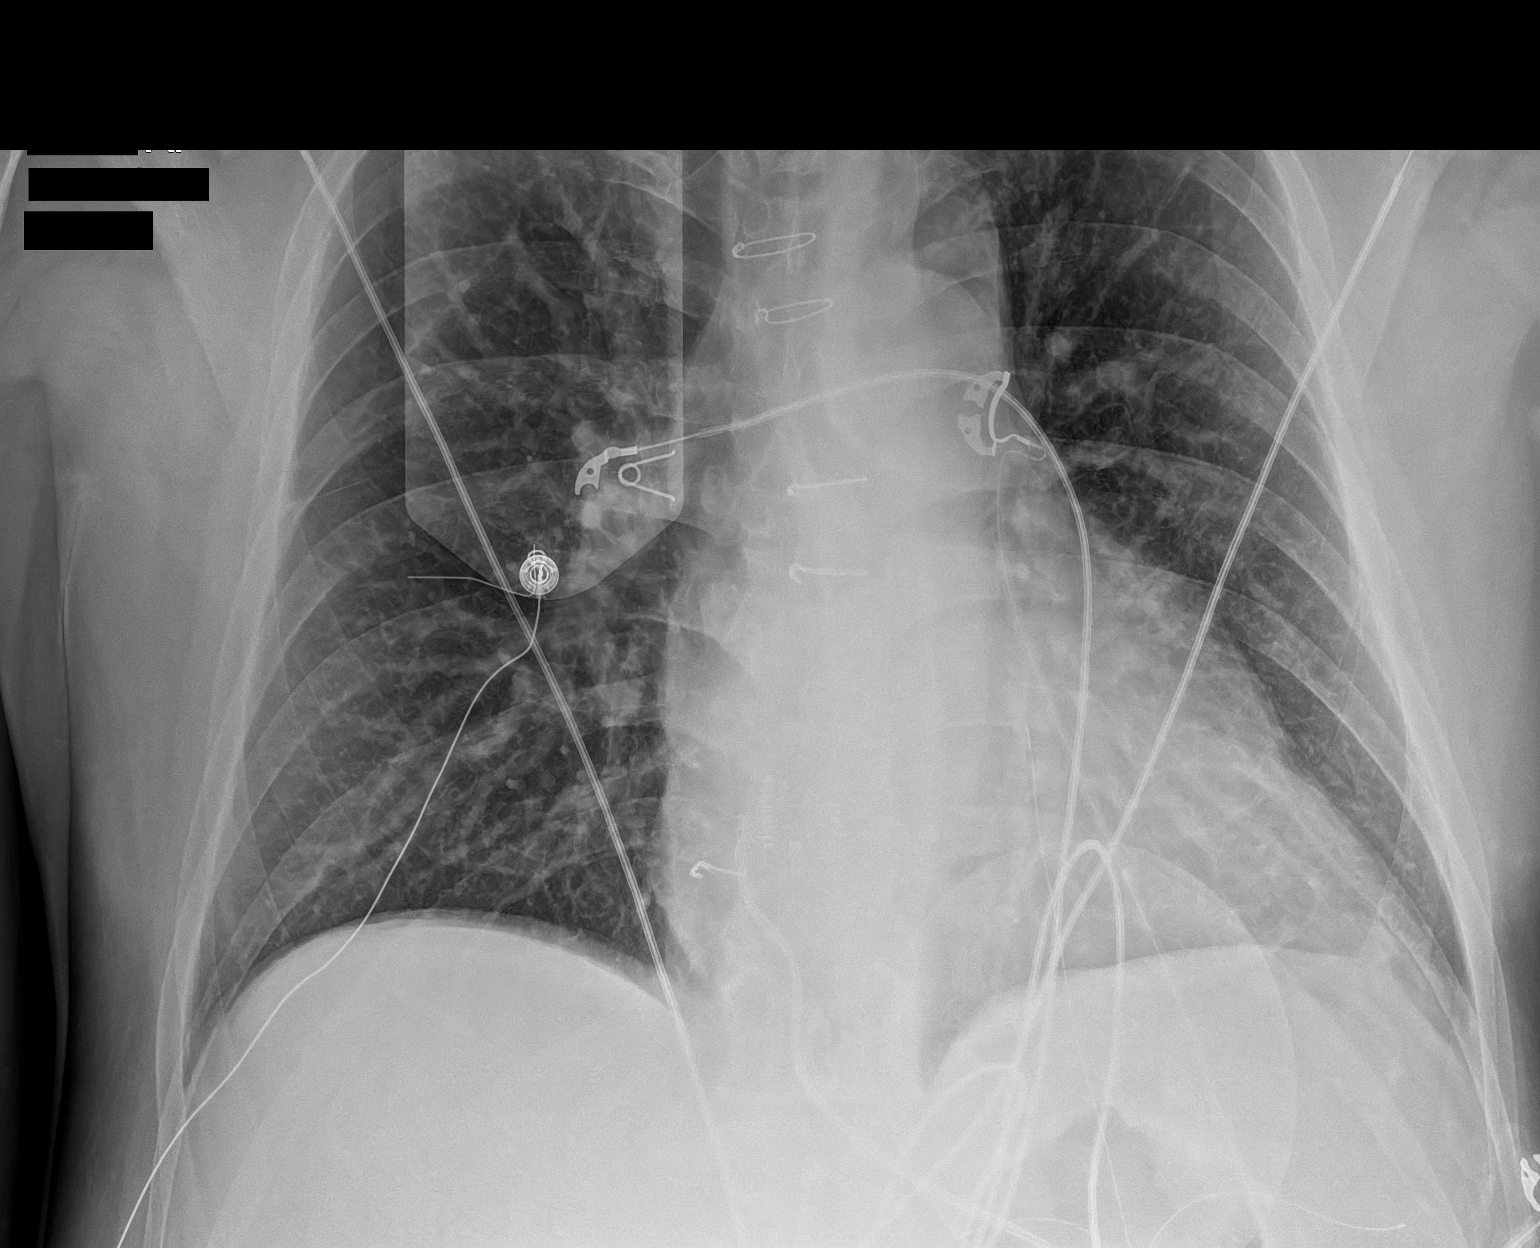
[im 2/2]
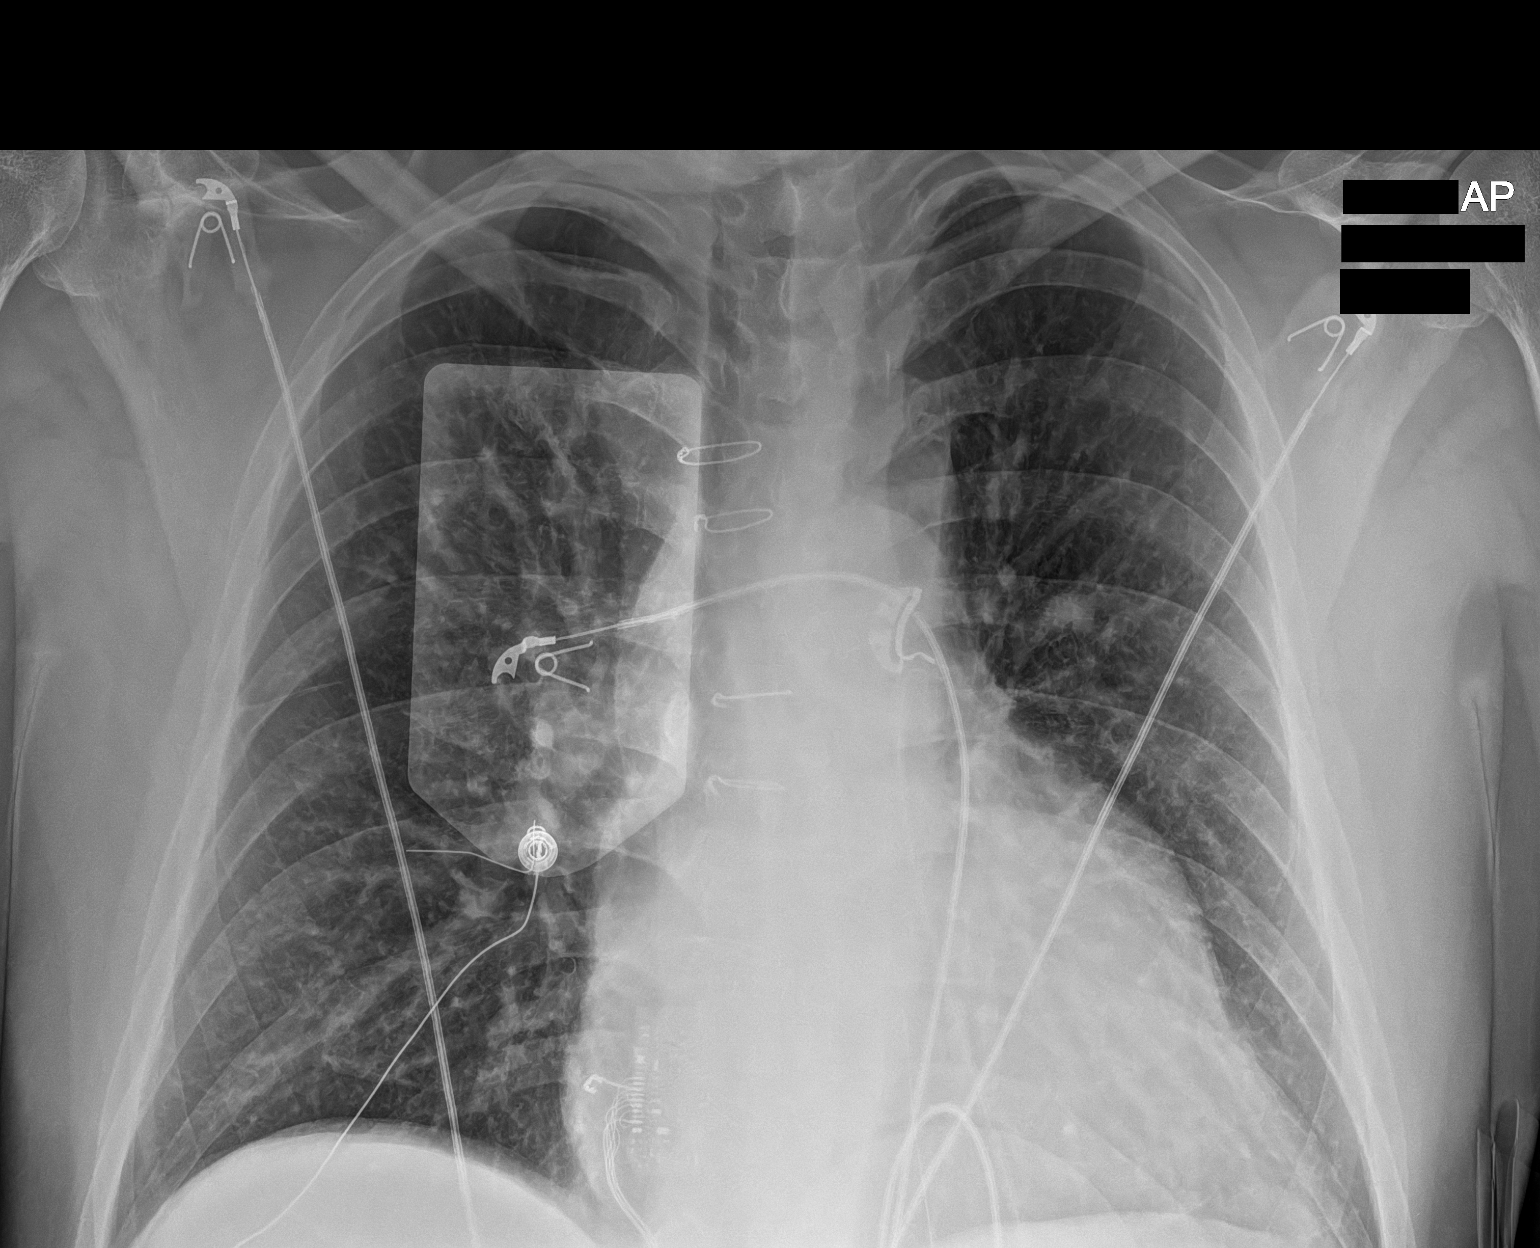

[2 of 2 positions shown; findings below may reference images not displayed]

FINDINGS: Shock pads overlie the thorax.  Stable postsurgical changes.

Cardiomediastinal silhouette is enlarged. Mediastinal contours
appear intact.

There is no evidence of focal airspace consolidation, pleural
effusion or pneumothorax.

Osseous structures are without acute abnormality. Soft tissues are
grossly normal.
IMPRESSION: No active disease.

## 2022-09-12 ENCOUNTER — Encounter: Payer: Self-pay | Admitting: Radiology

## 2023-03-04 ENCOUNTER — Emergency Department (HOSPITAL_COMMUNITY)
Admission: EM | Admit: 2023-03-04 | Discharge: 2023-03-04 | Disposition: A | Discharge status: Home / Self Care | Payer: 59 | Attending: Student | Admitting: Student

## 2023-03-04 ENCOUNTER — Encounter (HOSPITAL_COMMUNITY): Payer: Self-pay | Admitting: Emergency Medicine

## 2023-03-04 DIAGNOSIS — I5022 Chronic systolic (congestive) heart failure: Secondary | ICD-10-CM | POA: Diagnosis not present

## 2023-03-04 DIAGNOSIS — R002 Palpitations: Secondary | ICD-10-CM | POA: Insufficient documentation

## 2023-03-04 DIAGNOSIS — I471 Supraventricular tachycardia, unspecified: Secondary | ICD-10-CM

## 2023-03-04 DIAGNOSIS — E876 Hypokalemia: Secondary | ICD-10-CM | POA: Diagnosis not present

## 2023-03-04 DIAGNOSIS — R000 Tachycardia, unspecified: Secondary | ICD-10-CM | POA: Diagnosis not present

## 2023-03-04 DIAGNOSIS — Z79899 Other long term (current) drug therapy: Secondary | ICD-10-CM | POA: Diagnosis not present

## 2023-03-04 DIAGNOSIS — Z8616 Personal history of COVID-19: Secondary | ICD-10-CM | POA: Diagnosis not present

## 2023-03-04 DIAGNOSIS — R0602 Shortness of breath: Secondary | ICD-10-CM | POA: Insufficient documentation

## 2023-03-04 DIAGNOSIS — D72829 Elevated white blood cell count, unspecified: Secondary | ICD-10-CM | POA: Insufficient documentation

## 2023-03-04 DIAGNOSIS — I110 Hypertensive heart disease with heart failure: Secondary | ICD-10-CM | POA: Insufficient documentation

## 2023-03-04 DIAGNOSIS — Z7901 Long term (current) use of anticoagulants: Secondary | ICD-10-CM | POA: Diagnosis not present

## 2023-03-04 LAB — CBC WITH DIFFERENTIAL/PLATELET
Abs Immature Granulocytes: 0.05 10*3/uL (ref 0.00–0.07)
Basophils Absolute: 0.1 10*3/uL (ref 0.0–0.1)
Basophils Relative: 1 %
Eosinophils Absolute: 0.1 10*3/uL (ref 0.0–0.5)
Eosinophils Relative: 1 %
HCT: 52 % (ref 39.0–52.0)
Hemoglobin: 18.1 g/dL — ABNORMAL HIGH (ref 13.0–17.0)
Immature Granulocytes: 0 %
Lymphocytes Relative: 31 %
Lymphs Abs: 4.3 10*3/uL — ABNORMAL HIGH (ref 0.7–4.0)
MCH: 35.8 pg — ABNORMAL HIGH (ref 26.0–34.0)
MCHC: 34.8 g/dL (ref 30.0–36.0)
MCV: 103 fL — ABNORMAL HIGH (ref 80.0–100.0)
Monocytes Absolute: 1.4 10*3/uL — ABNORMAL HIGH (ref 0.1–1.0)
Monocytes Relative: 10 %
Neutro Abs: 8 10*3/uL — ABNORMAL HIGH (ref 1.7–7.7)
Neutrophils Relative %: 57 %
Platelets: 307 10*3/uL (ref 150–400)
RBC: 5.05 MIL/uL (ref 4.22–5.81)
RDW: 13.4 % (ref 11.5–15.5)
WBC: 13.9 10*3/uL — ABNORMAL HIGH (ref 4.0–10.5)
nRBC: 0 % (ref 0.0–0.2)

## 2023-03-04 LAB — COMPREHENSIVE METABOLIC PANEL
ALT: 54 U/L — ABNORMAL HIGH (ref 0–44)
AST: 62 U/L — ABNORMAL HIGH (ref 15–41)
Albumin: 4.1 g/dL (ref 3.5–5.0)
Alkaline Phosphatase: 61 U/L (ref 38–126)
Anion gap: 12 (ref 5–15)
BUN: 7 mg/dL (ref 6–20)
CO2: 25 mmol/L (ref 22–32)
Calcium: 9.3 mg/dL (ref 8.9–10.3)
Chloride: 95 mmol/L — ABNORMAL LOW (ref 98–111)
Creatinine, Ser: 0.95 mg/dL (ref 0.61–1.24)
GFR, Estimated: >60 mL/min (ref >60)
Glucose, Bld: 164 mg/dL — ABNORMAL HIGH (ref 70–99)
Potassium: 3.3 mmol/L — ABNORMAL LOW (ref 3.5–5.1)
Sodium: 132 mmol/L — ABNORMAL LOW (ref 135–145)
Total Bilirubin: 1.2 mg/dL (ref 0.3–1.2)
Total Protein: 7.7 g/dL (ref 6.5–8.1)

## 2023-03-04 MED ORDER — ADENOSINE 6 MG/2ML IV SOLN
INTRAVENOUS | Status: AC
  Filled 2023-03-04: qty 6

## 2023-03-04 MED ORDER — MAGNESIUM OXIDE -MG SUPPLEMENT 400 (240 MG) MG PO TABS
800.0000 mg | ORAL_TABLET | Freq: Once | ORAL | Status: AC
  Administered 2023-03-04: 800 mg via ORAL
  Filled 2023-03-04: qty 2

## 2023-03-04 MED ORDER — ETOMIDATE 2 MG/ML IV SOLN
INTRAVENOUS | Status: AC
  Filled 2023-03-04: qty 10

## 2023-03-04 MED ORDER — ADENOSINE 6 MG/2ML IV SOLN
INTRAVENOUS | Status: AC | PRN
  Administered 2023-03-04: 6 mg via INTRAVENOUS

## 2023-03-04 MED ORDER — POTASSIUM CHLORIDE CRYS ER 20 MEQ PO TBCR
40.0000 meq | EXTENDED_RELEASE_TABLET | Freq: Once | ORAL | Status: AC
  Administered 2023-03-04: 40 meq via ORAL
  Filled 2023-03-04: qty 2

## 2023-03-04 MED ORDER — ETOMIDATE 2 MG/ML IV SOLN
INTRAVENOUS | Status: AC | PRN
Start: 2023-03-04 — End: 2023-03-04
  Administered 2023-03-04: 10 mg via INTRAVENOUS

## 2023-03-04 NOTE — ED Notes (Signed)
Dc instructions and scripts reviewed with pt. Pt to follow up with cardiologist. Pt verbalized understanding. Wheeled out to lobby and friend to transport home.

## 2023-03-04 NOTE — Progress Notes (Signed)
RT present for conscious sedation. VS and airway stable throughout procedure. Patient tolerated well. No respiratory interventions had to be done.

## 2023-03-04 NOTE — ED Triage Notes (Signed)
Pt presents from home for palpitation with exertion. Recently had covid Wednesday. H/o afib. No missed doses of medications.

## 2023-03-04 NOTE — ED Notes (Signed)
Synchronized cardioversion performed. Pt converted to NSR.

## 2023-03-04 NOTE — ED Provider Notes (Signed)
[facility] DEPARTMENT AT [facility] Provider Note  CSN: [id number] Arrival date & time: 03/04/23 4098  Chief Complaint(s) Palpitations (Recent covid, h/o afib)  HPI Matthew [name] is a 47 y.o. 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  Patient states that he was diagnosed with COVID last week and has been essentially bedbound and had difficulty eating and drinking due to feeling poorly.  He states that he is an alcoholic and used to drink up to 1/5 of liquor daily but has significant cut back on this and only had a few beers this week.  He states that he noticed that his heart rate was normal when lying in the bed but when he would stand up to go the bathroom it would significantly increase.  Patient arrives with heart rate greater than 190 and feels short of breath.  Denies abdominal pain, nausea, vomiting, headache, fever or other systemic symptoms.  Does endorse good compliance with his Eliquis.   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 Authorizing Provider  acetaminophen (TYLENOL) 500 MG tablet Take 1,000 mg by mouth every 6 (six) hours as needed for headache (pain).    [provider]  apixaban (ELIQUIS) 5 MG TABS tablet Take 1 tablet (5 mg total) by mouth 2 (two) times daily. 12/18/18   Pricilla Riffle, MD  escitalopram (LEXAPRO) 10 MG tablet Take 1 tablet (10 mg total) by mouth daily. 10/30/21   Tommie Sams, DO  hydrOXYzine (VISTARIL) 25 MG capsule TAKE 1 CAPSULE BY MOUTH EVERY 8 HOURS AS NEEDED FOR ANXIETY 04/26/22   Cook, Jayce G, DO  losartan (COZAAR) 25 MG tablet Take 25 mg by mouth daily. 03/10/20   [provider]  metoprolol succinate (TOPROL-XL) 100 MG 24 hr tablet Take 100 mg by mouth daily. 03/19/20   [provider]  Multiple Vitamin (MULTIVITAMIN WITH MINERALS) TABS tablet Take 1 tablet by mouth daily.    [provider]  Past Surgical History Past Surgical History:  Procedure Laterality Date   FINGER ARTHROPLASTY Right    volar plate arthroplasty, right small finger proxima linterphalangeal joint [Other]   FRACTURE SURGERY     SEPTOSTOMY     Rashkind balloon atrial septostomy    TRANSPOSITION OF GREAT VESSELS REPAIR  1980   "mustard procedure"   Family History Family History  Problem Relation Age of Onset   Rheumatic fever Father    Hypertension Father     Social History Social History   Tobacco Use   Smoking status: Never   Smokeless tobacco: Current    Types: Snuff   Tobacco comments:    "dipped when I played softball; none since 2011"  Vaping Use   Vaping status: Every Day  Substance Use Topics   Alcohol use: Yes    Alcohol/week: 60.0 standard drinks of alcohol     Types: 60 Shots of liquor per week    Comment: 09/24/2014 "1/5th vodka in 2 days; easy"   Drug use: No   Allergies Aspirin  Review of Systems Review of Systems  Constitutional:  Positive for fatigue.  Respiratory:  Positive for shortness of breath.   Cardiovascular:  Positive for palpitations.    Physical Exam Vital Signs  I have reviewed the triage vital signs BP 118/85   Pulse 83   Temp 98.2 F (36.8 C) (Oral)   Resp (!) 22   Wt 117.9 kg   SpO2 100%   BMI 33.38 kg/m   Physical Exam Constitutional:      General: He is in acute distress.     Appearance: Normal appearance. He is ill-appearing.  HENT:     Head: Normocephalic and atraumatic.     Nose: No congestion or rhinorrhea.  Eyes:     General:        Right eye: No discharge.        Left eye: No discharge.     Extraocular Movements: Extraocular movements intact.     Pupils: Pupils are equal, round, and reactive to light.  Cardiovascular:     Rate and Rhythm: Regular rhythm. Tachycardia present.     Heart sounds: No murmur heard. Pulmonary:     Effort: No respiratory distress.     Breath sounds: No wheezing or rales.  Abdominal:     General: There is no distension.     Tenderness: There is no abdominal tenderness.  Musculoskeletal:        General: Normal range of motion.     Cervical back: Normal range of motion.  Skin:    General: Skin is warm and dry.  Neurological:     General: No focal deficit present.     Mental Status: He is alert.     ED Results and Treatments Labs (all labs ordered are listed, but only abnormal results are displayed) Labs Reviewed  COMPREHENSIVE METABOLIC PANEL - Abnormal; Notable for the following components:      Result Value   Sodium 132 (*)    Potassium 3.3 (*)    Chloride 95 (*)    Glucose, Bld 164 (*)    AST 62 (*)    ALT 54 (*)    All other components within normal limits  CBC WITH DIFFERENTIAL/PLATELET - Abnormal; Notable for the following components:   WBC  13.9 (*)    Hemoglobin 18.1 (*)    MCV 103.0 (*)    MCH 35.8 (*)    Neutro Abs 8.0 (*)  Lymphs Abs 4.3 (*)    Monocytes Absolute 1.4 (*)    All other components within normal limits                                                                                                                          Radiology No results found.  Pertinent labs & imaging results that were available during my care of the patient were reviewed by me and considered in my medical decision making (see MDM for details).  Medications Ordered in ED Medications  adenosine (ADENOCARD) 6 MG/2ML injection (has no administration in time range)  etomidate (AMIDATE) 2 MG/ML injection (has no administration in time range)  potassium chloride SA (KLOR-CON M) CR tablet 40 mEq (has no administration in time range)  magnesium oxide (MAG-OX) tablet 800 mg (has no administration in time range)  etomidate (AMIDATE) injection ( Intravenous Canceled Entry 03/04/23 0615)  adenosine (ADENOCARD) 6 MG/2ML injection (6 mg Intravenous Given 03/04/23 0556)                                                                                                                                     Procedures .Critical Care  Performed by: Glendora Score, MD Authorized by: Glendora Score, MD   Critical care provider statement:    Critical care time (minutes):  30   Critical care was necessary to treat or prevent imminent or life-threatening deterioration of the following conditions:  Cardiac failure   Critical care was time spent personally by me on the following activities:  Development of treatment plan with patient or surrogate, discussions with consultants, evaluation of patient's response to treatment, examination of patient, ordering and review of laboratory studies, ordering and review of radiographic studies, ordering and performing treatments and interventions, pulse oximetry, re-evaluation of patient's condition and review of old  charts .Sedation  Date/Time: 03/04/2023 5:50 PM  Performed by: Glendora Score, MD Authorized by: Glendora Score, MD   Consent:    Consent obtained:  Verbal   Consent given by:  Patient   Risks discussed:  Allergic reaction, dysrhythmia, inadequate sedation, nausea, prolonged hypoxia resulting in organ damage, prolonged sedation necessitating reversal, respiratory compromise necessitating ventilatory assistance and intubation and vomiting   Alternatives discussed:  Analgesia without sedation, anxiolysis and regional anesthesia Universal protocol:    Procedure explained and questions answered to patient or proxy's satisfaction: yes     Relevant  documents present and verified: yes     Test results available: yes     Imaging studies available: yes     Required blood products, implants, devices, and special equipment available: yes     Site/side marked: yes     Immediately prior to procedure, a time out was called: yes     Patient identity confirmed:  Verbally with patient Indications:    Procedure necessitating sedation performed by:  Physician performing sedation Pre-sedation assessment:    Time since last food or drink:  0800   ASA classification: class 1 - normal, healthy patient     Mouth opening:  3 or more finger widths   Thyromental distance:  4 finger widths   Mallampati score:  I - soft palate, uvula, fauces, pillars visible   Neck mobility: normal     Pre-sedation assessments completed and reviewed: airway patency, cardiovascular function, hydration status, mental status, nausea/vomiting, pain level, respiratory function and temperature   Immediate pre-procedure details:    Reassessment: Patient reassessed immediately prior to procedure     Reviewed: vital signs, relevant labs/tests and NPO status     Verified: bag valve mask available, emergency equipment available, intubation equipment available, IV patency confirmed, oxygen available and suction available   Procedure  details (see MAR for exact dosages):    Preoxygenation:  Nasal cannula   Sedation:  Etomidate   Intended level of sedation: deep   Intra-procedure monitoring:  Blood pressure monitoring, cardiac monitor, continuous pulse oximetry, frequent LOC assessments, frequent vital sign checks and continuous capnometry   Intra-procedure events: none     Total Provider sedation time (minutes):  15 Post-procedure details:    Attendance: Constant attendance by certified staff until patient recovered     Recovery: Patient returned to pre-procedure baseline     Post-sedation assessments completed and reviewed: airway patency, cardiovascular function, hydration status, mental status, nausea/vomiting, pain level, respiratory function and temperature     Patient is stable for discharge or admission: yes     Procedure completion:  Tolerated well, no immediate complications .Cardioversion  Date/Time: 03/04/2023 5:50 PM  Performed by: Glendora Score, MD Authorized by: Glendora Score, MD   Consent:    Consent obtained:  Verbal   Consent given by:  Patient   Risks discussed:  Cutaneous burn, death, pain and induced arrhythmia   Alternatives discussed:  Rate-control medication Pre-procedure details:    Cardioversion basis:  Elective   Rhythm:  Supraventricular tachycardia   Electrode placement:  Anterior-posterior Patient sedated: Yes. Refer to sedation procedure documentation for details of sedation.  Attempt one:    Cardioversion mode:  Synchronous   Shock (Joules):  200   Shock outcome:  Conversion to normal sinus rhythm Post-procedure details:    Patient status:  Awake   Patient tolerance of procedure:  Tolerated well, no immediate complications   (including critical care time)  Medical Decision Making / ED Course   This patient presents to the ED for concern of palpitations, shortness of breath, this involves an extensive number of treatment options, and is a complaint that carries with it a  high risk of complications and morbidity.  The differential diagnosis includes SVT, A-fib with RVR, a flutter, VT, dehydration, electrolyte abnormality  MDM: Patient seen emergency room for evaluation of rapid heart rate and palpitations.  Physical exam reveals a extremely rapid tachycardia but is otherwise unremarkable.  Patient is alert and oriented answering all questions appropriately.  Initial ECG showing SVT with a rate greater  than 190.  Did trial 6 of adenosine with no pause noted and overall no effect on heart rate.  However, patient did start to get diaphoretic with sensations of worsening shortness of breath.  Given his compliance with Eliquis what appears to be possible clinical decompensation, we proceeded with a sedated synchronized cardioversion.  Patient was sedated with etomidate and converted to normal sinus rhythm after 1 shock at 200 J.  Repeat ECG showing normal sinus rhythm.  Laboratory evaluation showing a potassium of 3.3 which was repleted in the ER, leukocytosis of 13.9 likely elevated in the setting of his known COVID-19 infection.  I did speak with the cardiologist on-call Dr. Rennis Golden who is recommending follow-up with his outpatient adult congenital cardiologist.  Patient was observed for an additional hour after cardioversion and had no additional evidence of dysrhythmia.  Patient voiced understanding of return precautions and was discharged with outpatient follow-up.  I did highly encourage alcohol cessation as this is certainly complicating his cardiac picture.   Additional history obtained:  -External records from outside source obtained and reviewed including: Chart review including previous notes, labs, imaging, consultation notes   Lab Tests: -I ordered, reviewed, and interpreted labs.   The pertinent results include:   Labs Reviewed  COMPREHENSIVE METABOLIC PANEL - Abnormal; Notable for the following components:      Result Value   Sodium 132 (*)    Potassium 3.3  (*)    Chloride 95 (*)    Glucose, Bld 164 (*)    AST 62 (*)    ALT 54 (*)    All other components within normal limits  CBC WITH DIFFERENTIAL/PLATELET - Abnormal; Notable for the following components:   WBC 13.9 (*)    Hemoglobin 18.1 (*)    MCV 103.0 (*)    MCH 35.8 (*)    Neutro Abs 8.0 (*)    Lymphs Abs 4.3 (*)    Monocytes Absolute 1.4 (*)    All other components within normal limits      EKG   EKG Interpretation Date/Time:  Tuesday March 04 2023 06:08:07 EDT Ventricular Rate:  85 PR Interval:  172 QRS Duration:  104 QT Interval:  364 QTC Calculation: 433 R Axis:   -43  Text Interpretation: Sinus rhythm Atrial premature complexes Abnormal R-wave progression, early transition LVH with secondary repolarization abnormality Baseline wander in lead(s) V2 Confirmed by Alona Bene [phone]) on 03/04/2023 12:24:41 PM          Medicines ordered and prescription drug management: Meds ordered this encounter  Medications   adenosine (ADENOCARD) 6 MG/2ML injection    [place], [place] : cabinet override   etomidate (AMIDATE) 2 MG/ML injection    Rush, Christian A: cabinet override   etomidate (AMIDATE) 2 MG/ML injection    Lamar Benes, Christian A: cabinet override   etomidate (AMIDATE) injection   adenosine (ADENOCARD) 6 MG/2ML injection   potassium chloride SA (KLOR-CON M) CR tablet 40 mEq   magnesium oxide (MAG-OX) tablet 800 mg    -I have reviewed the patients home medicines and have made adjustments as needed  Critical interventions Adenosine, cardioversion  Consultations Obtained: I requested consultation with the cardiologist on-call Dr. Rennis Golden,  and discussed lab and imaging findings as well as pertinent plan - they recommend: Outpatient adult congenital heart follow-up   Cardiac Monitoring: The patient was maintained on a cardiac monitor.  I personally viewed and interpreted the cardiac monitored which showed an underlying rhythm of: SVT, normal sinus  rhythm  Social Determinants of Health:  Factors impacting patients care include: Alcohol use   Reevaluation: After the interventions noted above, I reevaluated the patient and found that they have :improved  Co morbidities that complicate the patient evaluation  Past Medical History:  Diagnosis Date   Alcohol abuse    Anxiety    Atypical atrial flutter (HCC)    Complete transposition of great vessels    Depression    Dyslipidemia    GERD (gastroesophageal reflux disease)    Headache    "weekly" (09/24/2014)   Heart murmur    Hypertension    [facility] spotted fever 11/2013   SVT (supraventricular tachycardia) "several times in the last year" (09/24/2014)   Transposition of great vessel    s/p Mustard procedure in 1980      Dispostion: I considered admission for this patient, but with symptoms resolved and patient returning to normal sinus rhythm he currently does not meet inpatient criteria for admission.  He was given very strict return precautions of which he voiced understanding     Final Clinical Impression(s) / ED Diagnoses Final diagnoses:  None     @PCDICTATION @    Konstance Happel, Wyn Forster, MD 03/04/23 1755

## 2023-03-05 ENCOUNTER — Telehealth: Payer: Self-pay

## 2023-03-06 NOTE — Transitions of Care (Post Inpatient/ED Visit) (Signed)
   03/06/2023  Name: Matthew [name] MRN: [id number] DOB: 17-Jan-1976  Today's TOC FU Call Status: Today's TOC FU Call Status:: Unsuccessful Call (3rd Attempt) Unsuccessful Call (2nd Attempt) Date: 03/06/23 Unsuccessful Call (3rd Attempt) Date: 03/06/23  Attempted to reach the patient regarding the most recent Inpatient/ED visit.  Follow Up Plan: No further outreach attempts will be made at this time. We have been unable to contact the patient.  Signature Karena Addison, LPN [facility] Nurse Health Advisor Direct Dial [phone]

## 2023-03-06 NOTE — Transitions of Care (Post Inpatient/ED Visit) (Signed)
   03/06/2023  Name: PAU ZELDIN MRN: [id number] DOB: [birth year]  Today's TOC FU Call Status: Today's TOC FU Call Status:: Unsuccessful Call (2nd Attempt) Unsuccessful Call (2nd Attempt) Date: 03/06/23  Attempted to reach the patient regarding the most recent Inpatient/ED visit.  Follow Up Plan: Additional outreach attempts will be made to reach the patient to complete the Transitions of Care (Post Inpatient/ED visit) call.   Signature Karena Addison, LPN [facility] Nurse Health Advisor Direct Dial [phone]

## 2023-08-05 ENCOUNTER — Emergency Department (HOSPITAL_COMMUNITY): Payer: 59

## 2023-08-05 ENCOUNTER — Other Ambulatory Visit: Payer: Self-pay

## 2023-08-05 ENCOUNTER — Emergency Department (HOSPITAL_COMMUNITY)
Admission: EM | Admit: 2023-08-05 | Discharge: 2023-08-05 | Disposition: A | Discharge status: Home / Self Care | Payer: 59 | Attending: Emergency Medicine | Admitting: Emergency Medicine

## 2023-08-05 ENCOUNTER — Encounter (HOSPITAL_COMMUNITY): Payer: Self-pay

## 2023-08-05 DIAGNOSIS — R000 Tachycardia, unspecified: Secondary | ICD-10-CM | POA: Diagnosis present

## 2023-08-05 DIAGNOSIS — Z20822 Contact with and (suspected) exposure to covid-19: Secondary | ICD-10-CM | POA: Diagnosis not present

## 2023-08-05 DIAGNOSIS — I483 Typical atrial flutter: Secondary | ICD-10-CM

## 2023-08-05 DIAGNOSIS — Z7901 Long term (current) use of anticoagulants: Secondary | ICD-10-CM | POA: Insufficient documentation

## 2023-08-05 LAB — BASIC METABOLIC PANEL
Anion gap: 14 (ref 5–15)
BUN: 10 mg/dL (ref 6–20)
CO2: 19 mmol/L — ABNORMAL LOW (ref 22–32)
Calcium: 8.6 mg/dL — ABNORMAL LOW (ref 8.9–10.3)
Chloride: 102 mmol/L (ref 98–111)
Creatinine, Ser: 0.87 mg/dL (ref 0.61–1.24)
GFR, Estimated: >60 mL/min (ref >60)
Glucose, Bld: 230 mg/dL — ABNORMAL HIGH (ref 70–99)
Potassium: 4.3 mmol/L (ref 3.5–5.1)
Sodium: 135 mmol/L (ref 135–145)

## 2023-08-05 LAB — CBC WITH DIFFERENTIAL/PLATELET
Abs Immature Granulocytes: 0.02 10*3/uL (ref 0.00–0.07)
Basophils Absolute: 0 10*3/uL (ref 0.0–0.1)
Basophils Relative: 0 %
Eosinophils Absolute: 0.1 10*3/uL (ref 0.0–0.5)
Eosinophils Relative: 2 %
HCT: 46.6 % (ref 39.0–52.0)
Hemoglobin: 15.8 g/dL (ref 13.0–17.0)
Immature Granulocytes: 0 %
Lymphocytes Relative: 22 %
Lymphs Abs: 1.8 10*3/uL (ref 0.7–4.0)
MCH: 33.5 pg (ref 26.0–34.0)
MCHC: 33.9 g/dL (ref 30.0–36.0)
MCV: 98.9 fL (ref 80.0–100.0)
Monocytes Absolute: 0.7 10*3/uL (ref 0.1–1.0)
Monocytes Relative: 8 %
Neutro Abs: 5.6 10*3/uL (ref 1.7–7.7)
Neutrophils Relative %: 68 %
Platelets: 264 10*3/uL (ref 150–400)
RBC: 4.71 MIL/uL (ref 4.22–5.81)
RDW: 13.6 % (ref 11.5–15.5)
WBC: 8.2 10*3/uL (ref 4.0–10.5)
nRBC: 0 % (ref 0.0–0.2)

## 2023-08-05 LAB — RESP PANEL BY RT-PCR (RSV, FLU A&B, COVID)  RVPGX2
Influenza A by PCR: NEGATIVE
Influenza B by PCR: NEGATIVE
Resp Syncytial Virus by PCR: NEGATIVE
SARS Coronavirus 2 by RT PCR: NEGATIVE

## 2023-08-05 LAB — TROPONIN I (HIGH SENSITIVITY)
Troponin I (High Sensitivity): 11 ng/L (ref <18)
Troponin I (High Sensitivity): 16 ng/L (ref <18)

## 2023-08-05 LAB — BRAIN NATRIURETIC PEPTIDE: B Natriuretic Peptide: 173 pg/mL — ABNORMAL HIGH (ref 0.0–100.0)

## 2023-08-05 MED ORDER — METOPROLOL TARTRATE 5 MG/5ML IV SOLN: 5.0000 mg | Freq: Once | INTRAVENOUS | Status: AC

## 2023-08-05 MED ORDER — ONDANSETRON HCL 4 MG/2ML IJ SOLN
INTRAMUSCULAR | Status: AC
  Administered 2023-08-05: 4 mg via INTRAVENOUS
  Filled 2023-08-05: qty 2

## 2023-08-05 MED ORDER — FUROSEMIDE 20 MG PO TABS: 20.0000 mg | ORAL_TABLET | Freq: Two times a day (BID) | ORAL | 0 refills | Status: DC

## 2023-08-05 MED ORDER — ONDANSETRON HCL 4 MG/2ML IJ SOLN: 4.0000 mg | Freq: Once | INTRAMUSCULAR | Status: AC

## 2023-08-05 MED ORDER — METOPROLOL TARTRATE 5 MG/5ML IV SOLN
INTRAVENOUS | Status: AC
  Administered 2023-08-05: 5 mg via INTRAVENOUS
  Filled 2023-08-05: qty 5

## 2023-08-05 MED ORDER — METOPROLOL SUCCINATE ER 50 MG PO TB24
100.0000 mg | ORAL_TABLET | Freq: Every day | ORAL | Status: DC
  Administered 2023-08-05: 100 mg via ORAL
  Filled 2023-08-05: qty 2

## 2023-08-05 MED ORDER — APIXABAN 5 MG PO TABS: 5.0000 mg | ORAL_TABLET | Freq: Two times a day (BID) | ORAL | Status: DC

## 2023-08-05 MED ORDER — ADENOSINE 6 MG/2ML IV SOLN
INTRAVENOUS | Status: AC
  Administered 2023-08-05: 6 mg
  Filled 2023-08-05: qty 6

## 2023-08-05 MED ORDER — FUROSEMIDE 10 MG/ML IJ SOLN
40.0000 mg | Freq: Once | INTRAMUSCULAR | Status: AC
  Administered 2023-08-05: 40 mg via INTRAVENOUS
  Filled 2023-08-05: qty 4

## 2023-08-05 MED ORDER — IOHEXOL 350 MG/ML SOLN
75.0000 mL | Freq: Once | INTRAVENOUS | Status: AC | PRN
  Administered 2023-08-05: 75 mL via INTRAVENOUS

## 2023-08-05 MED ORDER — SODIUM CHLORIDE 0.9 % IV BOLUS
1000.0000 mL | Freq: Once | INTRAVENOUS | Status: AC
  Administered 2023-08-05: 1000 mL via INTRAVENOUS

## 2023-08-05 MED ORDER — ETOMIDATE 2 MG/ML IV SOLN
12.0000 mg | Freq: Once | INTRAVENOUS | Status: AC
  Administered 2023-08-05: 12 mg via INTRAVENOUS

## 2023-08-05 MED ORDER — ADENOSINE 6 MG/2ML IV SOLN
12.0000 mg | Freq: Once | INTRAVENOUS | Status: AC
  Administered 2023-08-05: 12 mg via INTRAVENOUS

## 2023-08-05 MED ORDER — ETOMIDATE 2 MG/ML IV SOLN
INTRAVENOUS | Status: AC
  Filled 2023-08-05: qty 10

## 2023-08-05 MED ORDER — LOSARTAN POTASSIUM 25 MG PO TABS: 25.0000 mg | ORAL_TABLET | Freq: Every day | ORAL | Status: DC

## 2023-08-05 NOTE — ED Provider Notes (Addendum)
[place] EMERGENCY DEPARTMENT AT [facility] Provider Note   CSN: [id number] Arrival date & time: 08/05/23  0631     History  Chief Complaint  Patient presents with   Tachycardia    Matthew [name] is a 48 y.o. male.  Presents with heart palpitations and fast heart rate. Reports history of SVT and AFIB requiring DC cardioversion in the past.       Home Medications Prior to Admission medications   Medication Sig Start Date End Date Taking? Authorizing Provider  acetaminophen (TYLENOL) 500 MG tablet Take 1,000 mg by mouth every 6 (six) hours as needed for headache (pain).    [provider]  apixaban (ELIQUIS) 5 MG TABS tablet Take 1 tablet (5 mg total) by mouth 2 (two) times daily. 12/18/18   Pricilla Riffle, MD  escitalopram (LEXAPRO) 10 MG tablet Take 1 tablet (10 mg total) by mouth daily. 10/30/21   Tommie Sams, DO  hydrOXYzine (VISTARIL) 25 MG capsule TAKE 1 CAPSULE BY MOUTH EVERY 8 HOURS AS NEEDED FOR ANXIETY 04/26/22   Cook, Jayce G, DO  losartan (COZAAR) 25 MG tablet Take 25 mg by mouth daily. 03/10/20   [provider]  metoprolol succinate (TOPROL-XL) 100 MG 24 hr tablet Take 100 mg by mouth daily. 03/19/20   [provider]  Multiple Vitamin (MULTIVITAMIN WITH MINERALS) TABS tablet Take 1 tablet by mouth daily.    [provider]      Allergies    Aspirin    Review of Systems   Review of Systems  Physical Exam Updated Vital Signs BP 132/87   Pulse 89   Resp 19   Ht 6\' 2"  (1.88 m)   Wt (!) 143.6 kg   SpO2 97%   BMI 40.65 kg/m  Physical Exam Vitals and nursing note reviewed.  Constitutional:      General: He is not in acute distress.    Appearance: He is well-developed.  HENT:     Head: Normocephalic and atraumatic.     Mouth/Throat:     Mouth: Mucous membranes are moist.  Eyes:     General: Vision grossly intact. Gaze aligned appropriately.     Extraocular Movements: Extraocular movements intact.      Conjunctiva/sclera: Conjunctivae normal.  Cardiovascular:     Rate and Rhythm: Regular rhythm. Tachycardia present.     Pulses: Normal pulses.     Heart sounds: Normal heart sounds, S1 normal and S2 normal. No murmur heard.    No friction rub. No gallop.  Pulmonary:     Effort: Pulmonary effort is normal. No respiratory distress.     Breath sounds: Normal breath sounds.  Abdominal:     Palpations: Abdomen is soft.     Tenderness: There is no abdominal tenderness. There is no guarding or rebound.     Hernia: No hernia is present.  Musculoskeletal:        General: No swelling.     Cervical back: Full passive range of motion without pain, normal range of motion and neck supple. No pain with movement, spinous process tenderness or muscular tenderness. Normal range of motion.     Right lower leg: No edema.     Left lower leg: No edema.  Skin:    General: Skin is warm and dry.     Capillary Refill: Capillary refill takes less than 2 seconds.     Findings: No ecchymosis, erythema, lesion or wound.  Neurological:     Mental  Status: He is alert and oriented to person, place, and time.     GCS: GCS eye subscore is 4. GCS verbal subscore is 5. GCS motor subscore is 6.     Cranial Nerves: Cranial nerves 2-12 are intact.     Sensory: Sensation is intact.     Motor: Motor function is intact. No weakness or abnormal muscle tone.     Coordination: Coordination is intact.  Psychiatric:        Mood and Affect: Mood normal.        Speech: Speech normal.        Behavior: Behavior normal.     ED Results / Procedures / Treatments   Labs (all labs ordered are listed, but only abnormal results are displayed) Labs Reviewed  CBC WITH DIFFERENTIAL/PLATELET  BASIC METABOLIC PANEL  TROPONIN I (HIGH SENSITIVITY)    EKG EKG Interpretation Date/Time:  Tuesday August 05 2023 06:44:58 EST Ventricular Rate:  184 PR Interval:    QRS Duration:  93 QT Interval:  284 QTC Calculation: 497 R  Axis:   -41  Text Interpretation: Supraventricular tachycardia Left axis deviation Abnormal R-wave progression, early transition Repolarization abnormality, prob rate related Confirmed by Gilda Crease [phone]) on 08/05/2023 7:16:16 AM  Radiology No results found.  Procedures .Cardioversion  Date/Time: 08/05/2023 7:10 AM  Performed by: Gilda Crease, MD Authorized by: Gilda Crease, MD   Consent:    Consent obtained:  Written   Consent given by:  Patient   Risks discussed:  Cutaneous burn, death, induced arrhythmia and pain Pre-procedure details:    Cardioversion basis:  Emergent   Rhythm:  Supraventricular tachycardia   Electrode placement:  Anterior-posterior Patient sedated: Yes. Refer to sedation procedure documentation for details of sedation.  Attempt one:    Cardioversion mode:  Synchronous   Waveform:  Biphasic   Shock (Joules):  200   Shock outcome:  Conversion to normal sinus rhythm Post-procedure details:    Patient status:  Awake   Patient tolerance of procedure:  Tolerated well, no immediate complications Comments:     Attempted chemical cardioversion with adenosine, 6 mg IV push followed by 12 mg IV push without any change in rhythm  .Sedation  Date/Time: 08/05/2023 7:11 AM  Performed by: Gilda Crease, MD Authorized by: Gilda Crease, MD   Consent:    Consent obtained:  Verbal   Consent given by:  Patient   Risks discussed:  Allergic reaction, dysrhythmia, inadequate sedation, nausea, prolonged hypoxia resulting in organ damage, prolonged sedation necessitating reversal, respiratory compromise necessitating ventilatory assistance and intubation and vomiting   Alternatives discussed:  Analgesia without sedation, anxiolysis and regional anesthesia Universal protocol:    Procedure explained and questions answered to patient or proxy's satisfaction: yes     Relevant documents present and verified: yes     Test  results available: yes     Imaging studies available: yes     Required blood products, implants, devices, and special equipment available: yes     Site/side marked: yes     Immediately prior to procedure, a time out was called: yes     Patient identity confirmed:  Verbally with patient Indications:    Procedure performed:  Cardioversion   Procedure necessitating sedation performed by:  Physician performing sedation Pre-sedation assessment:    Time since last food or drink:  12   ASA classification: class 2 - patient with mild systemic disease     Mouth opening:  3 or  more finger widths   Thyromental distance:  4 finger widths   Mallampati score:  II - soft palate, uvula, fauces visible   Neck mobility: normal     Pre-sedation assessments completed and reviewed: airway patency, cardiovascular function, hydration status, mental status, nausea/vomiting, pain level, respiratory function and temperature   A pre-sedation assessment was completed prior to the start of the procedure Immediate pre-procedure details:    Reassessment: Patient reassessed immediately prior to procedure     Reviewed: vital signs, relevant labs/tests and NPO status     Verified: bag valve mask available, emergency equipment available, intubation equipment available, IV patency confirmed, oxygen available and suction available   Procedure details (see MAR for exact dosages):    Preoxygenation:  Nasal cannula   Sedation:  Etomidate   Intended level of sedation: deep   Intra-procedure monitoring:  Blood pressure monitoring, cardiac monitor, continuous pulse oximetry, frequent LOC assessments, frequent vital sign checks and continuous capnometry   Intra-procedure events: none     Total Provider sedation time (minutes):  15 Post-procedure details:   A post-sedation assessment was completed following the completion of the procedure.   Attendance: Constant attendance by certified staff until patient recovered     Recovery:  Patient returned to pre-procedure baseline     Post-sedation assessments completed and reviewed: airway patency, cardiovascular function, hydration status, mental status, nausea/vomiting, pain level, respiratory function and temperature     Patient is stable for discharge or admission: yes     Procedure completion:  Tolerated well, no immediate complications .Critical Care  Performed by: Gilda Crease, MD Authorized by: Gilda Crease, MD   Critical care provider statement:    Critical care time (minutes):  30   Critical care time was exclusive of:  Separately billable procedures and treating other patients   Critical care was necessary to treat or prevent imminent or life-threatening deterioration of the following conditions:  Cardiac failure   Critical care was time spent personally by me on the following activities:  Development of treatment plan with patient or surrogate, discussions with consultants, evaluation of patient's response to treatment, examination of patient, ordering and review of laboratory studies, ordering and review of radiographic studies, ordering and performing treatments and interventions, pulse oximetry, re-evaluation of patient's condition and review of old charts     Medications Ordered in ED Medications  etomidate (AMIDATE) 2 MG/ML injection (  Not Given 08/05/23 0709)  adenosine (ADENOCARD) 6 MG/2ML injection (6 mg  Given 08/05/23 0655)  adenosine (ADENOCARD) 6 MG/2ML injection 12 mg (12 mg Intravenous Given 08/05/23 0657)  etomidate (AMIDATE) injection 12 mg (12 mg Intravenous Given 08/05/23 0705)  ondansetron (ZOFRAN) injection 4 mg (4 mg Intravenous Given 08/05/23 8756)    ED Course/ Medical Decision Making/ A&P                                 Medical Decision Making Amount and/or Complexity of Data Reviewed Labs: ordered.  Risk Prescription drug management.   Patient with SVT/A-fib presents to the emergency department with concerns  over rapid heart rate.  Patient was awakened by initially fluttering sensation followed by very fast heart rate.  No associated chest pain.  Patient reports that this has happened multiple times in the past and he has converted to sinus rhythm with medications at times and other other times required DC cardioversion.  EKG shows heart rate at 185.  It  is a narrow complex regular tachycardia, SVT versus rapid A-fib.  Patient anticoagulated on Eliquis, has not missed any doses.  Initially tried cardioversion/rhythm delineation with adenosine.  Patient administered 6 mg IV push followed by 12 mg IV push.  No change in rhythm to identify if this was an underlying A-fib a flutter.  Patient's blood pressure borderline just over 100 systolic.  He is awake and alert but starting to feel some nausea, likely secondary to the adenosine.  Discussed further procedures with the patient and he did consent for DC cardioversion which was performed.  No complications.  Patient in sinus rhythm.        Final Clinical Impression(s) / ED Diagnoses Final diagnoses:  Tachycardia    Rx / DC Orders ED Discharge Orders     None         Elishah Ashmore, Canary Brim, MD 08/05/23 4401    Gilda Crease, MD 08/05/23 [phone]

## 2023-08-05 NOTE — ED Notes (Signed)
Provider at bedside

## 2023-08-05 NOTE — Discharge Instructions (Addendum)
You were seen in the emergency department for evaluation of rapid heart rate.  Previous ER physicians successfully cardioverted you back to normal sinus rhythm.  While in the emergency department, it was noted that your oxygen levels were fluctuating below normal levels.  You did receive some diuretic medications and it appears the oxygen levels are starting to improve.  The previous physicians requested a transfer to [facility] for hospital admission as your hypoxia had not entirely resolved.  You are requesting to leave the emergency department today and follow-up in their office instead of being admitted to the hospital.  While this is not recommended, it is not entirely unreasonable as it does appear that your oxygen levels are improving.  However, it is extremely important that if you are not going to go through with hospital admission today it is extremely important that you call the [facility] cardiology team for close follow-up.  I have also spoken with the cardiology team who will reach out to you to close the loop.  It is also very important that you return to the emergency department if you feel short of breath in any way.

## 2023-08-05 NOTE — ED Notes (Signed)
Patient in CT at this time.

## 2023-08-05 NOTE — ED Triage Notes (Addendum)
Pt presents with afib that started tonight. Hx of same. Pt endorses SOB. HR 185. EDP at bedside

## 2023-08-05 NOTE — ED Provider Notes (Signed)
   Procedures  .Critical Care  Performed by: Lonell Grandchild, MD Authorized by: Lonell Grandchild, MD   Critical care provider statement:    Critical care time (minutes):  30   Critical care was time spent personally by me on the following activities:  Development of treatment plan with patient or surrogate, discussions with consultants, evaluation of patient's response to treatment, examination of patient, ordering and review of laboratory studies, ordering and review of radiographic studies, ordering and performing treatments and interventions, pulse oximetry, re-evaluation of patient's condition and review of old charts   Care discussed with: accepting provider at another facility     ED Course / MDM   Clinical Course as of 08/05/23 1604  Tue Aug 05, 2023  0816 Received sign out from Dr. Blinda Leatherwood pending re-assessment. Presenting with narrow complex tachycardia s/p cardioversion. Hx TGA. Can discharge once stable [WS]  1032 Patient noted to be hypoxic.  He was hypoxic into the low 80s off oxygen.  On ambulation, he desaturated into the 80s.  He overall feels fine and better after cardioversion, however given hypoxia, probably will need to admit the patient.  BNP is slightly elevated, chest x-ray shows some evidence of pulmonary edema so we will give Lasix.  Suspect likely CHF, low concern for pulmonary embolism as patient is on chronic Eliquis.  Has had some cough recently but no focal infiltrate on chest x-ray.  Or leukocytosis to suggest infection. [WS]  1603 Discussed with cardiologist, Dr. Jenene Slicker, given congenital heart disease, she recommends the patient be transferred to [facility].  Discussed with [facility] transfer center where patient gets his care.  They have accepted the patient.  Accepted by Dr. Jamelle Haring. [WS]  [phone] Signed out to Dr. Posey Rea pending transfer. If improved s/p lasix could be discharged if hypoxia resolves.  [WS]    Clinical Course User Index [WS] Lonell Grandchild, MD    Medical Decision Making Amount and/or Complexity of Data Reviewed Labs: ordered. Radiology: ordered.  Risk Prescription drug management.          Lonell Grandchild, MD 08/05/23 [phone]

## 2023-08-05 NOTE — ED Notes (Signed)
Lab at bedside

## 2023-08-05 NOTE — ED Notes (Signed)
Pt in CT at this time.

## 2023-08-05 NOTE — ED Provider Notes (Signed)
Physical Exam  BP (!) 115/91   Pulse 89   Temp 97.7 F (36.5 C) (Oral)   Resp 16   Ht 6\' 2"  (1.88 m)   Wt (!) 143.6 kg   SpO2 95%   BMI 40.65 kg/m   Physical Exam Constitutional:      General: He is not in acute distress.    Appearance: Normal appearance.  HENT:     Head: Normocephalic and atraumatic.     Nose: No congestion or rhinorrhea.  Eyes:     General:        Right eye: No discharge.        Left eye: No discharge.     Extraocular Movements: Extraocular movements intact.     Pupils: Pupils are equal, round, and reactive to light.  Cardiovascular:     Rate and Rhythm: Normal rate and regular rhythm.     Heart sounds: No murmur heard. Pulmonary:     Effort: No respiratory distress.     Breath sounds: No wheezing or rales.  Abdominal:     General: There is no distension.     Tenderness: There is no abdominal tenderness.  Musculoskeletal:        General: Normal range of motion.     Cervical back: Normal range of motion.  Skin:    General: Skin is warm and dry.  Neurological:     General: No focal deficit present.     Mental Status: He is alert.     Procedures  Procedures  ED Course / MDM   Clinical Course as of 08/05/23 1610  Tue Aug 05, 2023  0816 Received sign out from Dr. Blinda Leatherwood pending re-assessment. Presenting with narrow complex tachycardia s/p cardioversion. Hx TGA. Can discharge once stable [WS]  1032 Patient noted to be hypoxic.  He was hypoxic into the low 80s off oxygen.  On ambulation, he desaturated into the 80s.  He overall feels fine and better after cardioversion, however given hypoxia, probably will need to admit the patient.  BNP is slightly elevated, chest x-ray shows some evidence of pulmonary edema so we will give Lasix.  Suspect likely CHF, low concern for pulmonary embolism as patient is on chronic Eliquis.  Has had some cough recently but no focal infiltrate on chest x-ray.  Or leukocytosis to suggest infection. [WS]  1603 Discussed  with cardiologist, Dr. Jenene Slicker, given congenital heart disease, she recommends the patient be transferred to [facility].  Discussed with [facility] transfer center where patient gets his care.  They have accepted the patient.  Accepted by Dr. Jamelle Haring. [WS]  [phone] Signed out to Dr. Posey Rea pending transfer. If improved s/p lasix could be discharged if hypoxia resolves.  [WS]    Clinical Course User Index [WS] Lonell Grandchild, MD   Medical Decision Making Amount and/or Complexity of Data Reviewed Labs: ordered. Radiology: ordered.  Risk Prescription drug management.   Patient received in handoff.  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  Remains in the emergency department because of persistent hypoxia after sedation.  On previous providers exam, patient remained hypoxic despite changing pulse ox to multiple locations.  Signed out to me with plans to transfer to [facility] due to complex congenital heart disease and persistent hypoxia.  On my reevaluation, patient states that he feels completely back to normal and has had intermittent hypoxia at home for many months.  Here in the emergency room, while at  rest he is primarily saturating between 95 and 100% and will have very brief moments of oxygen desaturation to the upper 80s.  Suspect there may be a component of mixing in the setting of his mustard repair.  Patient is requesting to be discharged at this time he does not want to stay for further workup at [facility].  He is alert and oriented answering all questions appropriately and is able to make his own medical decisions.  Clear that this is certainly a risk and he will need close outpatient follow-up if he is going to pursue this plan with very strict return precautions of which he voiced understanding.  I also spoke with the a accepting physician at [facility] Dr. Jamelle Haring who will help arrange close outpatient follow-up for the patient.  We  repeated an ambulatory pulse ox and he did not have significant hypoxia with ambulation.  Thus, after shared decision making, patient will be discharged with close outpatient follow-up and very strict return precautions as patient is of sound mind and able to make his own medical decisions.  He was thus discharged by informed discharge       Glendora Score, MD 08/05/23 [phone]

## 2023-08-05 NOTE — ED Notes (Signed)
Patient ambulated with this RN up and down the hallway. Patient denies shortness of breath, dizziness and unsteadiness at this time. Patient ambulated with steady and even gait and states, "I feel fine." Patient 02 stats dropped down to 85 while ambulating. MD Scheving made aware.

## 2023-10-02 ENCOUNTER — Other Ambulatory Visit: Payer: Self-pay

## 2023-10-02 ENCOUNTER — Encounter (HOSPITAL_COMMUNITY): Payer: Self-pay | Admitting: Emergency Medicine

## 2023-10-02 ENCOUNTER — Emergency Department (HOSPITAL_COMMUNITY)
Admission: EM | Admit: 2023-10-02 | Discharge: 2023-10-02 | Disposition: A | Discharge status: Home / Self Care | Attending: Emergency Medicine | Admitting: Emergency Medicine

## 2023-10-02 DIAGNOSIS — I4891 Unspecified atrial fibrillation: Secondary | ICD-10-CM | POA: Insufficient documentation

## 2023-10-02 DIAGNOSIS — Z87891 Personal history of nicotine dependence: Secondary | ICD-10-CM | POA: Insufficient documentation

## 2023-10-02 DIAGNOSIS — R0602 Shortness of breath: Secondary | ICD-10-CM | POA: Diagnosis present

## 2023-10-02 DIAGNOSIS — Z7901 Long term (current) use of anticoagulants: Secondary | ICD-10-CM | POA: Diagnosis not present

## 2023-10-02 HISTORY — DX: Unspecified atrial fibrillation: I48.91

## 2023-10-02 LAB — CBC
HCT: 44.3 % (ref 39.0–52.0)
Hemoglobin: 15.4 g/dL (ref 13.0–17.0)
MCH: 33.6 pg (ref 26.0–34.0)
MCHC: 34.8 g/dL (ref 30.0–36.0)
MCV: 96.7 fL (ref 80.0–100.0)
Platelets: 198 10*3/uL (ref 150–400)
RBC: 4.58 MIL/uL (ref 4.22–5.81)
RDW: 14.6 % (ref 11.5–15.5)
WBC: 7.9 10*3/uL (ref 4.0–10.5)
nRBC: 0 % (ref 0.0–0.2)

## 2023-10-02 LAB — BASIC METABOLIC PANEL
Anion gap: 12 (ref 5–15)
BUN: 7 mg/dL (ref 6–20)
CO2: 23 mmol/L (ref 22–32)
Calcium: 8.8 mg/dL — ABNORMAL LOW (ref 8.9–10.3)
Chloride: 102 mmol/L (ref 98–111)
Creatinine, Ser: 0.78 mg/dL (ref 0.61–1.24)
GFR, Estimated: >60 mL/min (ref >60)
Glucose, Bld: 204 mg/dL — ABNORMAL HIGH (ref 70–99)
Potassium: 3.8 mmol/L (ref 3.5–5.1)
Sodium: 137 mmol/L (ref 135–145)

## 2023-10-02 LAB — MAGNESIUM: Magnesium: 1.5 mg/dL — ABNORMAL LOW (ref 1.7–2.4)

## 2023-10-02 MED ORDER — ETOMIDATE 2 MG/ML IV SOLN
10.0000 mg | Freq: Once | INTRAVENOUS | Status: AC
  Administered 2023-10-02: 10 mg via INTRAVENOUS
  Filled 2023-10-02: qty 10

## 2023-10-02 MED ORDER — MAGNESIUM SULFATE 2 GM/50ML IV SOLN
2.0000 g | Freq: Once | INTRAVENOUS | Status: AC
  Administered 2023-10-02: 2 g via INTRAVENOUS
  Filled 2023-10-02: qty 50

## 2023-10-02 NOTE — ED Triage Notes (Signed)
 Pt bib pov w/ c/o afib. Pt has a hx of abif with 2 episodes of cardioversion. Pt is 180 in triage.

## 2023-10-02 NOTE — ED Provider Notes (Signed)
 [place] EMERGENCY DEPARTMENT AT [facility] Provider Note   CSN: [id number] Arrival date & time: 10/02/23  4098     History  Chief Complaint  Patient presents with   Atrial Fibrillation    Matthew [name] is a 48 y.o. male.  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  He has had problems with atrial fibrillation for a few years.  He is on Eliquis and metoprolol and has not missed any doses.  He has required cardioversions in the past.  He said he was well until acutely this morning he felt his heart start fluttering and then racing.  It causes him to be a little short of breath if he exerts himself.  No chest pain diaphoresis dizziness syncope.  Has been compliant with his medications.  He has not eaten or drank anything today.  The history is provided by the patient.  Atrial Fibrillation This is a recurrent problem. The current episode started less than 1 hour ago. The problem occurs constantly. The problem has not changed since onset.Associated symptoms include shortness of breath. Pertinent negatives include no chest pain, no abdominal pain and no headaches. The symptoms are aggravated by walking. Nothing relieves the symptoms. He has tried rest for the symptoms. The treatment provided no relief.       Home Medications Prior to Admission medications   Medication Sig Start Date End Date Taking? Authorizing Provider  acetaminophen (TYLENOL) 500 MG tablet Take 1,000 mg by mouth every 6 (six) hours as needed for headache (pain).    [provider]  apixaban (ELIQUIS) 5 MG TABS tablet Take 1 tablet (5 mg total) by mouth 2 (two) times daily. 12/18/18   Pricilla Riffle, MD  escitalopram (LEXAPRO) 10 MG tablet Take 1 tablet (10 mg total) by mouth daily. Patient not taking: Reported on 08/05/2023 10/30/21   Tommie Sams, DO  furosemide (LASIX) 20 MG tablet Take 1 tablet (20 mg total) by mouth 2 (two) times daily  for 5 days. 08/05/23 08/10/23  Kommor, Madison, MD  hydrOXYzine (VISTARIL) 25 MG capsule TAKE 1 CAPSULE BY MOUTH EVERY 8 HOURS AS NEEDED FOR ANXIETY Patient taking differently: Take 25 mg by mouth 3 (three) times daily. 04/26/22   Tommie Sams, DO  losartan (COZAAR) 25 MG tablet Take 25 mg by mouth daily. 03/10/20   [provider]  metoprolol succinate (TOPROL-XL) 100 MG 24 hr tablet Take 100 mg by mouth daily. 03/19/20   [provider]  Multiple Vitamin (MULTIVITAMIN WITH MINERALS) TABS tablet Take 1 tablet by mouth daily.    [provider]      Allergies    Aspirin    Review of Systems   Review of Systems  Constitutional:  Negative for fever.  HENT:  Negative for sore throat.   Respiratory:  Positive for shortness of breath.   Cardiovascular:  Negative for chest pain.  Gastrointestinal:  Negative for abdominal pain.  Genitourinary:  Negative for dysuria.  Skin:  Negative for rash.  Neurological:  Negative for headaches.    Physical Exam Updated Vital Signs BP (!) 142/95   Pulse (!) 180   Ht 6\' 2"  (1.88 m)   Wt (!) 143 kg   SpO2 94%   BMI 40.48 kg/m  Physical Exam Vitals and nursing note reviewed.  Constitutional:      General: He is not in acute distress.    Appearance: Normal appearance.  He is well-developed.  HENT:     Head: Normocephalic and atraumatic.  Eyes:     Conjunctiva/sclera: Conjunctivae normal.  Cardiovascular:     Rate and Rhythm: Tachycardia present. Rhythm irregular.     Heart sounds: No murmur heard. Pulmonary:     Effort: Pulmonary effort is normal. No respiratory distress.     Breath sounds: Normal breath sounds.  Abdominal:     Palpations: Abdomen is soft.     Tenderness: There is no abdominal tenderness. There is no guarding or rebound.  Musculoskeletal:        General: No deformity.     Cervical back: Neck supple.  Skin:    General: Skin is warm and dry.     Capillary Refill: Capillary refill takes less than 2  seconds.  Neurological:     General: No focal deficit present.     Mental Status: He is alert and oriented to person, place, and time.     Cranial Nerves: No cranial nerve deficit.     Sensory: No sensory deficit.     Motor: No weakness.     ED Results / Procedures / Treatments   Labs (all labs ordered are listed, but only abnormal results are displayed) Labs Reviewed  BASIC METABOLIC PANEL - Abnormal; Notable for the following components:      Result Value   Glucose, Bld 204 (*)    Calcium 8.8 (*)    All other components within normal limits  MAGNESIUM - Abnormal; Notable for the following components:   Magnesium 1.5 (*)    All other components within normal limits  CBC    EKG EKG Interpretation Date/Time:  Thursday October 02 2023 07:49:22 EDT Ventricular Rate:  98 PR Interval:  53 QRS Duration:  107 QT Interval:  366 QTC Calculation: 468 R Axis:   -32  Text Interpretation: Sinus rhythm Short PR interval Left axis deviation RVH with secondary repolarization abnrm Abnormal T, consider ischemia, lateral leads after cardioversion 200J Confirmed by Meridee Score [phone]) on 10/02/2023 8:00:45 AM  Radiology No results found.  Procedures .Critical Care  Performed by: Terrilee Files, MD Authorized by: Terrilee Files, MD   Critical care provider statement:    Critical care time (minutes):  30   Critical care was necessary to treat or prevent imminent or life-threatening deterioration of the following conditions:  Cardiac failure   Critical care was time spent personally by me on the following activities:  Development of treatment plan with patient or surrogate, discussions with consultants, evaluation of patient's response to treatment, examination of patient, ordering and review of laboratory studies, ordering and review of radiographic studies, ordering and performing treatments and interventions, pulse oximetry, re-evaluation of patient's condition and review of old  charts .Sedation  Date/Time: 10/02/2023 7:54 AM  Performed by: Terrilee Files, MD Authorized by: Terrilee Files, MD   Consent:    Consent obtained:  Written   Consent given by:  Patient   Risks discussed:  Dysrhythmia, inadequate sedation, nausea, vomiting, respiratory compromise necessitating ventilatory assistance and intubation and prolonged hypoxia resulting in organ damage   Alternatives discussed:  Analgesia without sedation Universal protocol:    Procedure explained and questions answered to patient or proxy's satisfaction: yes     Immediately prior to procedure, a time out was called: yes     Patient identity confirmed:  Arm band Indications:    Procedure necessitating sedation performed by:  Physician performing sedation Pre-sedation assessment:  Time since last food or drink:  4   ASA classification: class 2 - patient with mild systemic disease     Mouth opening:  3 or more finger widths   Mallampati score:  II - soft palate, uvula, fauces visible   Neck mobility: normal     Pre-sedation assessments completed and reviewed: airway patency, cardiovascular function, hydration status, mental status, nausea/vomiting, pain level, respiratory function and temperature   A pre-sedation assessment was completed prior to the start of the procedure Immediate pre-procedure details:    Reassessment: Patient reassessed immediately prior to procedure     Reviewed: vital signs, relevant labs/tests and NPO status     Verified: bag valve mask available, emergency equipment available, intubation equipment available, IV patency confirmed, oxygen available and suction available   Procedure details (see MAR for exact dosages):    Preoxygenation:  Nonrebreather mask   Sedation:  Etomidate   Intended level of sedation: deep   Intra-procedure monitoring:  Blood pressure monitoring, cardiac monitor, continuous pulse oximetry, continuous capnometry, frequent LOC assessments and frequent vital  sign checks   Intra-procedure events: none     Intra-procedure management:  Supplemental oxygen   Total Provider sedation time (minutes):  15 Post-procedure details:   A post-sedation assessment was completed following the completion of the procedure.   Attendance: Constant attendance by certified staff until patient recovered     Recovery: Patient returned to pre-procedure baseline     Post-sedation assessments completed and reviewed: airway patency, cardiovascular function, hydration status, mental status, nausea/vomiting, pain level, respiratory function and temperature     Patient is stable for discharge or admission: yes     Procedure completion:  Tolerated well, no immediate complications .Cardioversion  Date/Time: 10/02/2023 7:55 AM  Performed by: Terrilee Files, MD Authorized by: Terrilee Files, MD   Consent:    Consent obtained:  Written   Consent given by:  Patient   Risks discussed:  Cutaneous burn, death, induced arrhythmia and pain   Alternatives discussed:  Rate-control medication, alternative treatment, delayed treatment, observation, referral and no treatment Pre-procedure details:    Cardioversion basis:  Emergent   Rhythm:  Atrial fibrillation   Electrode placement:  Anterior-posterior Patient sedated: Yes. Refer to sedation procedure documentation for details of sedation.  Attempt one:    Cardioversion mode:  Synchronous   Waveform:  Biphasic   Shock (Joules):  200   Shock outcome:  Conversion to normal sinus rhythm Post-procedure details:    Patient status:  Alert   Patient tolerance of procedure:  Tolerated well, no immediate complications     Medications Ordered in ED Medications  etomidate (AMIDATE) injection 10 mg (10 mg Intravenous Given 10/02/23 0744)  magnesium sulfate IVPB 2 g 50 mL (0 g Intravenous Stopped 10/02/23 0851)    ED Course/ Medical Decision Making/ A&P Clinical Course as of 10/02/23 1655  Thu Oct 02, 2023  0804 Patient feeling  better after cardioversion.  He said he feels a little tired but otherwise back to baseline.  Will continue to observe. [MB]  [id number] Patient is ambulated in the department satting 93 to 95% and feels asymptomatic.  He is appropriate for discharge.  He has a ride home. [MB]    Clinical Course User Index [MB] Terrilee Files, MD                                 Medical Decision Making  Amount and/or Complexity of Data Reviewed Labs: ordered.  Risk Prescription drug management.   This patient complains of rapid heart rate; this involves an extensive number of treatment Options and is a complaint that carries with it a high risk of complications and morbidity. The differential includes arrhythmia, A-fib, pneumonia, PE, dehydration, metabolic derangement  I ordered, reviewed and interpreted labs, which included CBC normal chemistries with elevated glucose, magnesium low I ordered medication IV magnesium, conscious sedation and reviewed PMP when indicated. Previous records obtained and reviewed in epic including prior cardiology and ED notes Cardiac monitoring reviewed, atrial fibrillation with RVR improving to normal sinus rhythm Social determinants considered, tobacco use Critical Interventions: Conscious sedation and cardioversion  After the interventions stated above, I reevaluated the patient and found patient to be symptomatically improved Admission and further testing considered, no indications for admission.  He feels back to baseline.  Recommended close follow-up with his cardiology team.  Return instructions discussed         Final Clinical Impression(s) / ED Diagnoses Final diagnoses:  Atrial fibrillation with rapid ventricular response (HCC)  Hypomagnesemia    Rx / DC Orders ED Discharge Orders     None         Terrilee Files, MD 10/02/23 1657

## 2023-10-02 NOTE — Discharge Instructions (Signed)
 You were seen in the emergency department for an elevated heart rate.  You were cardioverted and your heart rate is now back in sinus.  Your magnesium was mildly low and you were given some magnesium.  Please contact your cardiology team at [facility] for close follow-up.  Return if any worsening or concerning symptoms

## 2023-10-02 NOTE — ED Notes (Signed)
 Patient ambulated around nurses station approximately 120 feet. Pulse oxygen stated between 93-95% on room air.

## 2023-12-26 ENCOUNTER — Encounter (HOSPITAL_COMMUNITY): Payer: Self-pay

## 2023-12-26 ENCOUNTER — Other Ambulatory Visit: Payer: Self-pay

## 2023-12-26 ENCOUNTER — Emergency Department (HOSPITAL_COMMUNITY)
Admission: EM | Admit: 2023-12-26 | Discharge: 2023-12-26 | Disposition: A | Discharge status: Home / Self Care | Attending: Emergency Medicine | Admitting: Emergency Medicine

## 2023-12-26 DIAGNOSIS — R739 Hyperglycemia, unspecified: Secondary | ICD-10-CM | POA: Insufficient documentation

## 2023-12-26 DIAGNOSIS — D72829 Elevated white blood cell count, unspecified: Secondary | ICD-10-CM | POA: Diagnosis not present

## 2023-12-26 DIAGNOSIS — I471 Supraventricular tachycardia, unspecified: Secondary | ICD-10-CM | POA: Diagnosis not present

## 2023-12-26 DIAGNOSIS — Z7901 Long term (current) use of anticoagulants: Secondary | ICD-10-CM | POA: Insufficient documentation

## 2023-12-26 DIAGNOSIS — R000 Tachycardia, unspecified: Secondary | ICD-10-CM | POA: Diagnosis present

## 2023-12-26 LAB — BASIC METABOLIC PANEL WITH GFR
Anion gap: 14 (ref 5–15)
BUN: 6 mg/dL (ref 6–20)
CO2: 23 mmol/L (ref 22–32)
Calcium: 8.9 mg/dL (ref 8.9–10.3)
Chloride: 98 mmol/L (ref 98–111)
Creatinine, Ser: 0.86 mg/dL (ref 0.61–1.24)
GFR, Estimated: >60 mL/min (ref >60)
Glucose, Bld: 234 mg/dL — ABNORMAL HIGH (ref 70–99)
Potassium: 3.7 mmol/L (ref 3.5–5.1)
Sodium: 135 mmol/L (ref 135–145)

## 2023-12-26 LAB — CBC
HCT: 48.8 % (ref 39.0–52.0)
Hemoglobin: 17.5 g/dL — ABNORMAL HIGH (ref 13.0–17.0)
MCH: 35.5 pg — ABNORMAL HIGH (ref 26.0–34.0)
MCHC: 35.9 g/dL (ref 30.0–36.0)
MCV: 99 fL (ref 80.0–100.0)
Platelets: 370 10*3/uL (ref 150–400)
RBC: 4.93 MIL/uL (ref 4.22–5.81)
RDW: 13.1 % (ref 11.5–15.5)
WBC: 11.9 10*3/uL — ABNORMAL HIGH (ref 4.0–10.5)
nRBC: 0 % (ref 0.0–0.2)

## 2023-12-26 LAB — MAGNESIUM: Magnesium: 1.7 mg/dL (ref 1.7–2.4)

## 2023-12-26 MED ORDER — ADENOSINE 6 MG/2ML IV SOLN: 12.0000 mg | Freq: Once | INTRAVENOUS | Status: DC

## 2023-12-26 MED ORDER — PROPOFOL 10 MG/ML IV BOLUS: 0.5000 mg/kg | Freq: Once | INTRAVENOUS | Status: AC

## 2023-12-26 MED ORDER — PROPOFOL 10 MG/ML IV BOLUS
INTRAVENOUS | Status: AC | PRN
  Administered 2023-12-26: 40 mg via INTRAVENOUS
  Administered 2023-12-26: 10 mg via INTRAVENOUS

## 2023-12-26 MED ORDER — ADENOSINE 6 MG/2ML IV SOLN
INTRAVENOUS | Status: AC
  Administered 2023-12-26: 18 mg via INTRAVENOUS
  Filled 2023-12-26: qty 6

## 2023-12-26 MED ORDER — ADENOSINE 6 MG/2ML IV SOLN
INTRAVENOUS | Status: AC
  Administered 2023-12-26: 12 mg via INTRAVENOUS
  Filled 2023-12-26: qty 6

## 2023-12-26 MED ORDER — SODIUM CHLORIDE 0.9 % IV BOLUS
1000.0000 mL | Freq: Once | INTRAVENOUS | Status: AC
  Administered 2023-12-26: 1000 mL via INTRAVENOUS

## 2023-12-26 MED ORDER — ADENOSINE 6 MG/2ML IV SOLN: 12.0000 mg | Freq: Once | INTRAVENOUS | Status: AC

## 2023-12-26 MED ORDER — ADENOSINE 6 MG/2ML IV SOLN: 18.0000 mg | Freq: Once | INTRAVENOUS | Status: AC

## 2023-12-26 MED ORDER — PROPOFOL 1000 MG/100ML IV EMUL
INTRAVENOUS | Status: AC
  Administered 2023-12-26: 50 mg via INTRAVENOUS
  Filled 2023-12-26: qty 100

## 2023-12-26 NOTE — ED Notes (Signed)
 18 adenosine 

## 2023-12-26 NOTE — ED Notes (Addendum)
 Cardiovert  200j shock

## 2023-12-26 NOTE — ED Notes (Signed)
 Pt talking to EDP  in complete sentences.

## 2023-12-26 NOTE — ED Notes (Addendum)
 Prop 5 ml push

## 2023-12-26 NOTE — ED Triage Notes (Signed)
 Pt arrived POV with c/o HR 160-199 at home this morning. Pt has a hx of afib. EDP at bedside during triage.

## 2023-12-26 NOTE — ED Provider Notes (Signed)
 [place] EMERGENCY DEPARTMENT AT [facility] Provider Note   CSN: [id number] Arrival date & time: 12/26/23  4098     Patient presents with: svt   Matthew [name] is a 48 y.o. male.   HPI   This patient is a 48 year old male history of atrial fibrillation on Eliquis  and metoprolol , the patient is not a diabetic, he reports that he has had symptoms that started this morning when he got out of the shower feeling like his heart was racing, there was a little bit of near syncope, he tried to drive himself to work but because he was feeling so terrible he decided to come here instead.  He has had this happen multiple times in the past and has had to be cardioverted 3 times this year according to his report.  He is currently following with [facility] cardiology to consider doing some type of other surgery.  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  He has been on Eliquis  for over a year and has not missed any doses in the last month.  He denies fevers chills coughing shortness of breath or chest pain, no swelling of the legs or infectious symptoms, no changes in his routine and he slept well last night.  Prior to Admission medications   Medication Sig Start Date End Date Taking? Authorizing Provider  acetaminophen  (TYLENOL ) 500 MG tablet Take 1,000 mg by mouth every 6 (six) hours as needed for headache (pain).    [provider]  apixaban  (ELIQUIS ) 5 MG TABS tablet Take 1 tablet (5 mg total) by mouth 2 (two) times daily. 12/18/18   Elmyra Haggard, MD  escitalopram  (LEXAPRO ) 10 MG tablet Take 1 tablet (10 mg total) by mouth daily. Patient not taking: Reported on 08/05/2023 10/30/21   Cook, Jayce G, DO  furosemide  (LASIX ) 20 MG tablet Take 1 tablet (20 mg total) by mouth 2 (two) times daily for 5 days. 08/05/23 08/10/23  Kommor, Madison, MD  hydrOXYzine  (VISTARIL ) 25 MG capsule TAKE 1 CAPSULE BY MOUTH EVERY 8  HOURS AS NEEDED FOR ANXIETY Patient taking differently: Take 25 mg by mouth 3 (three) times daily. 04/26/22   Cook, Jayce G, DO  losartan  (COZAAR ) 25 MG tablet Take 25 mg by mouth daily. 03/10/20   [provider]  metoprolol  succinate (TOPROL -XL) 100 MG 24 hr tablet Take 100 mg by mouth daily. 03/19/20   [provider]  Multiple Vitamin (MULTIVITAMIN WITH MINERALS) TABS tablet Take 1 tablet by mouth daily.    [provider]    Allergies: Aspirin    Review of Systems  All other systems reviewed and are negative.   Updated Vital Signs BP 114/77   Pulse 79   Temp 97.9 F (36.6 C) (Oral)   Resp 17   Ht 1.88 m (6' 2)   Wt 134.3 kg   SpO2 95%   BMI 38.00 kg/m   Physical Exam Vitals and nursing note reviewed.  Constitutional:      General: He is not in acute distress.    Appearance: He is well-developed.  HENT:     Head: Normocephalic and atraumatic.     Mouth/Throat:     Pharynx: No oropharyngeal exudate.   Eyes:     General: No scleral icterus.       Right eye: No discharge.        Left eye: No discharge.  Conjunctiva/sclera: Conjunctivae normal.     Pupils: Pupils are equal, round, and reactive to light.   Neck:     Thyroid: No thyromegaly.     Vascular: No JVD.   Cardiovascular:     Rate and Rhythm: Regular rhythm. Tachycardia present.     Heart sounds: Normal heart sounds. No murmur heard.    No friction rub. No gallop.  Pulmonary:     Effort: Pulmonary effort is normal. No respiratory distress.     Breath sounds: Normal breath sounds. No wheezing or rales.  Abdominal:     General: Bowel sounds are normal. There is no distension.     Palpations: Abdomen is soft. There is no mass.     Tenderness: There is no abdominal tenderness.   Musculoskeletal:        General: No tenderness. Normal range of motion.     Cervical back: Normal range of motion and neck supple.  Lymphadenopathy:     Cervical: No cervical adenopathy.   Skin:     General: Skin is warm and dry.     Findings: No erythema or rash.   Neurological:     Mental Status: He is alert.     Coordination: Coordination normal.   Psychiatric:        Behavior: Behavior normal.     (all labs ordered are listed, but only abnormal results are displayed) Labs Reviewed  CBC - Abnormal; Notable for the following components:      Result Value   WBC 11.9 (*)    Hemoglobin 17.5 (*)    MCH 35.5 (*)    All other components within normal limits  BASIC METABOLIC PANEL WITH GFR - Abnormal; Notable for the following components:   Glucose, Bld 234 (*)    All other components within normal limits  MAGNESIUM     EKG: EKG Interpretation Date/Time:  Friday December 26 2023 08:16:54 EDT Ventricular Rate:  81 PR Interval:  177 QRS Duration:  106 QT Interval:  371 QTC Calculation: 431 R Axis:   -43  Text Interpretation: Sinus rhythm Atrial premature complexes Left axis deviation Right ventricular hypertrophy Repol abnrm, prob ischemia, anterolateral lds since last tracing no significant change Confirmed by Early Glisson ([id number]) on 12/26/2023 8:48:35 AM  Radiology: No results found.   .Cardioversion  Date/Time: 12/26/2023 8:20 AM  Performed by: Early Glisson, MD Authorized by: Early Glisson, MD   Consent:    Consent obtained:  Verbal   Consent given by:  Patient   Alternatives discussed:  Rate-control medication and alternative treatment Pre-procedure details:    Cardioversion basis:  Emergent   Rhythm:  Supraventricular tachycardia   Electrode placement:  Anterior-lateral Patient sedated: Yes. Refer to sedation procedure documentation for details of sedation.  Attempt one:    Cardioversion mode:  Synchronous   Waveform:  Biphasic   Shock (Joules):  200   Shock outcome:  Conversion to normal sinus rhythm Post-procedure details:    Patient status:  Awake   Patient tolerance of procedure:  Tolerated well, no immediate complications Comments:         .Sedation  Date/Time: 12/26/2023 8:20 AM  Performed by: Early Glisson, MD Authorized by: Early Glisson, MD   Consent:    Consent obtained:  Verbal   Consent given by:  Patient   Risks discussed:  Dysrhythmia, inadequate sedation, nausea, allergic reaction, prolonged hypoxia resulting in organ damage, prolonged sedation necessitating reversal, respiratory compromise necessitating ventilatory assistance and intubation and vomiting   Alternatives discussed:  Analgesia without sedation Universal protocol:    Procedure explained and questions answered to patient or proxy's satisfaction: yes     Immediately prior to procedure, a time out was called: yes   Indications:    Procedure performed:  Cardioversion   Procedure necessitating sedation performed by:  Physician performing sedation Pre-sedation assessment:    Time since last food or drink:  2   NPO status caution: urgency dictates proceeding with non-ideal NPO status     ASA classification: class 2 - patient with mild systemic disease     Mouth opening:  3 or more finger widths   Mallampati score:  III - soft palate, base of uvula visible   Neck mobility: normal     Pre-sedation assessments completed and reviewed: airway patency, cardiovascular function, hydration status, mental status, nausea/vomiting, pain level, respiratory function and temperature   A pre-sedation assessment was completed prior to the start of the procedure Immediate pre-procedure details:    Reviewed: vital signs and relevant labs/tests     Verified: bag valve mask available, emergency equipment available and intubation equipment available   Procedure details (see MAR for exact dosages):    Preoxygenation:  Nasal cannula   Sedation:  Propofol    Intended level of sedation: deep   Intra-procedure monitoring:  Continuous pulse oximetry, blood pressure monitoring and cardiac monitor   Intra-procedure events: none     Total Provider sedation time (minutes):   12 Post-procedure details:   A post-sedation assessment was completed following the completion of the procedure.   Attendance: Constant attendance by certified staff until patient recovered     Recovery: Patient returned to pre-procedure baseline     Post-sedation assessments completed and reviewed: airway patency, cardiovascular function, hydration status, mental status, nausea/vomiting, pain level, respiratory function and temperature     Patient is stable for discharge or admission: yes     Procedure completion:  Tolerated well, no immediate complications Comments:       .Critical Care  Performed by: Early Glisson, MD Authorized by: Early Glisson, MD   Critical care provider statement:    Critical care time was exclusive of:  Separately billable procedures and treating other patients and teaching time   Critical care was necessary to treat or prevent imminent or life-threatening deterioration of the following conditions:  Cardiac failure   Critical care was time spent personally by me on the following activities:  Development of treatment plan with patient or surrogate, discussions with consultants, evaluation of patient's response to treatment, examination of patient, obtaining history from patient or surrogate, ordering and performing treatments and interventions, ordering and review of laboratory studies, ordering and review of radiographic studies, pulse oximetry, re-evaluation of patient's condition and review of old charts   I assumed direction of critical care for this patient from another provider in my specialty: no   Comments:          Medications Ordered in the ED  adenosine  (ADENOCARD ) 6 MG/2ML injection 12 mg (12 mg Intravenous Given 12/26/23 0748)  propofol  (DIPRIVAN ) 10 mg/mL bolus/IV push 71.5 mg (50 mg Intravenous Given 12/26/23 0810)  adenosine  (ADENOCARD ) 6 MG/2ML injection 18 mg (18 mg Intravenous Given 12/26/23 0752)  sodium chloride  0.9 % bolus 1,000 mL (0 mLs  Intravenous Stopped 12/26/23 0915)  propofol  (DIPRIVAN ) 10 mg/mL bolus/IV push (10 mg Intravenous Given by Other 12/26/23 0814)  Medical Decision Making Amount and/or Complexity of Data Reviewed Labs: ordered.  Risk Prescription drug management.    This patient presents to the ED for concern of tachycardia, this involves an extensive number of treatment options, and is a complaint that carries with it a high risk of complications and morbidity.  The differential diagnosis includes A-fib, SVT, ventricular tachycardia   Co morbidities / Chronic conditions that complicate the patient evaluation  Obesity, chronic A-fib, chronic heart disease   Additional history obtained:  Additional history obtained from EMR External records from outside source obtained and reviewed including electronic medical record including prior cardiology notes   Lab Tests:  I Ordered, and personally interpreted labs.  The pertinent results include: CBC with mild leukocytosis, increased hemoglobin consistent with hemoconcentration, metabolic panel with hyperglycemia at 234 but no renal dysfunction magnesium  normal     Cardiac Monitoring: / EKG:  The patient was maintained on a cardiac monitor.  I personally viewed and interpreted the cardiac monitored which showed an underlying rhythm of: Narrow complex supraventricular tachycardia rate of 190 and regular   Problem List / ED Course / Critical interventions / Medication management  The patient was given adenosine  twice at 12 mg and 18 mg and had only about 6 seconds of conversion to sinus rhythm before going back into SVT.  I discussed the case with Dr. Amanda Jungling of the cardiology service who recommended cardioversion given that the patient's blood pressure was around 80/40, he had some improvement with IV fluids with his blood pressure up to 100 systolic at which time he was given 100 mg of propofol  successfully, no  complications no hypoxia and no hypotension.  He cardioverted successfully at 200 J on the first attempt I have reviewed the patients home medicines and have made adjustments as needed   Consultations Obtained:  I requested consultation with the Dr. Amanda Jungling,  and discussed lab and imaging findings as well as pertinent plan - they recommend: Cardioversion   Social Determinants of Health:  Congenital heart   Test / Admission - Considered:  Considered admission but the patient converted successfully and is normotensive at the time of discharge with a heart rate of 80 and sinus rhythm with a blood pressure of 111/73.  He ambulated without difficulty and feels comfortable going home, I do not see any other signs of cardiac dysfunction or sepsis, the patient is agreeable to return should symptoms worsen.  Scheduled to follow-up with [facility] cardiology      Final diagnoses:  SVT (supraventricular tachycardia) [facility])    ED Discharge Orders     None          Early Glisson, MD 12/26/23 1006

## 2023-12-26 NOTE — ED Notes (Signed)
 Prop 1ml

## 2023-12-26 NOTE — ED Notes (Signed)
 Prop 4ml given.

## 2023-12-26 NOTE — ED Notes (Signed)
 Pt up walking and states he is feeling better. Pt walked to the restroom and back to room. MD and RN notified.

## 2023-12-26 NOTE — Discharge Instructions (Signed)
 Thankfully your heart rate has returned to a normal rate, your lab work was unremarkable and I did discuss your care with the cardiologist, they recommended that we do the shock which was successful, you can follow-up with your heart doctor this week, please call them today to let them know that you were in the hospital and needed a shock.  If you should develop severe or worsening symptoms return to the emergency department immediately.  Please make sure that you continue to take your metoprolol  as prescribed and do not miss any doses of your Eliquis .

## 2024-01-01 ENCOUNTER — Emergency Department (HOSPITAL_COMMUNITY)
Admission: EM | Admit: 2024-01-01 | Discharge: 2024-01-01 | Disposition: A | Discharge status: Home / Self Care | Attending: Emergency Medicine | Admitting: Emergency Medicine

## 2024-01-01 ENCOUNTER — Encounter (HOSPITAL_COMMUNITY): Payer: Self-pay

## 2024-01-01 ENCOUNTER — Other Ambulatory Visit: Payer: Self-pay

## 2024-01-01 ENCOUNTER — Emergency Department (HOSPITAL_COMMUNITY)

## 2024-01-01 DIAGNOSIS — I471 Supraventricular tachycardia, unspecified: Secondary | ICD-10-CM | POA: Insufficient documentation

## 2024-01-01 DIAGNOSIS — Z7901 Long term (current) use of anticoagulants: Secondary | ICD-10-CM | POA: Insufficient documentation

## 2024-01-01 DIAGNOSIS — R000 Tachycardia, unspecified: Secondary | ICD-10-CM | POA: Diagnosis present

## 2024-01-01 LAB — CBC WITH DIFFERENTIAL/PLATELET
Abs Immature Granulocytes: 0.03 K/uL (ref 0.00–0.07)
Basophils Absolute: 0.1 K/uL (ref 0.0–0.1)
Basophils Relative: 1 %
Eosinophils Absolute: 0.1 K/uL (ref 0.0–0.5)
Eosinophils Relative: 1 %
HCT: 48 % (ref 39.0–52.0)
Hemoglobin: 16.9 g/dL (ref 13.0–17.0)
Immature Granulocytes: 0 %
Lymphocytes Relative: 21 %
Lymphs Abs: 2.1 K/uL (ref 0.7–4.0)
MCH: 35.2 pg — ABNORMAL HIGH (ref 26.0–34.0)
MCHC: 35.2 g/dL (ref 30.0–36.0)
MCV: 100 fL (ref 80.0–100.0)
Monocytes Absolute: 1.2 K/uL — ABNORMAL HIGH (ref 0.1–1.0)
Monocytes Relative: 12 %
Neutro Abs: 6.2 K/uL (ref 1.7–7.7)
Neutrophils Relative %: 65 %
Platelets: 306 K/uL (ref 150–400)
RBC: 4.8 MIL/uL (ref 4.22–5.81)
RDW: 13.3 % (ref 11.5–15.5)
WBC: 9.6 K/uL (ref 4.0–10.5)
nRBC: 0 % (ref 0.0–0.2)

## 2024-01-01 LAB — BASIC METABOLIC PANEL WITH GFR
Anion gap: 13 (ref 5–15)
BUN: <5 mg/dL — ABNORMAL LOW (ref 6–20)
CO2: 22 mmol/L (ref 22–32)
Calcium: 9.2 mg/dL (ref 8.9–10.3)
Chloride: 99 mmol/L (ref 98–111)
Creatinine, Ser: 0.74 mg/dL (ref 0.61–1.24)
GFR, Estimated: >60 mL/min (ref >60)
Glucose, Bld: 177 mg/dL — ABNORMAL HIGH (ref 70–99)
Potassium: 3.7 mmol/L (ref 3.5–5.1)
Sodium: 134 mmol/L — ABNORMAL LOW (ref 135–145)

## 2024-01-01 MED ORDER — ADENOSINE 6 MG/2ML IV SOLN
INTRAVENOUS | Status: AC
  Administered 2024-01-01: 6 mg
  Filled 2024-01-01: qty 2

## 2024-01-01 MED ORDER — PROPOFOL 10 MG/ML IV BOLUS
INTRAVENOUS | Status: AC | PRN
  Administered 2024-01-01 (×2): 10 mg via INTRAVENOUS
  Administered 2024-01-01: 50 mg via INTRAVENOUS

## 2024-01-01 MED ORDER — ADENOSINE 6 MG/2ML IV SOLN
12.0000 mg | Freq: Once | INTRAVENOUS | Status: AC
  Administered 2024-01-01: 12 mg via INTRAVENOUS

## 2024-01-01 MED ORDER — PROPOFOL 10 MG/ML IV BOLUS
0.5000 mg/kg | Freq: Once | INTRAVENOUS | Status: AC
  Administered 2024-01-01: 66.9 mg via INTRAVENOUS
  Filled 2024-01-01: qty 20

## 2024-01-01 MED ORDER — ADENOSINE 6 MG/2ML IV SOLN
INTRAVENOUS | Status: AC
  Filled 2024-01-01: qty 4

## 2024-01-01 NOTE — Sedation Documentation (Signed)
 200 J synchronized defib delivered to patient without issue. Patient cardioversion successful.

## 2024-01-01 NOTE — Progress Notes (Signed)
 Called to patient's room by RN for Cardioversion on patient.  ETCO2 was connected, suctions was ready and ambu near patient.  Patient tolerated procedure well.  Patient's HR had been in 160s and even 170s at times; after procedure, patient's rate came down into the 70s.  RN is currently at bedside.

## 2024-01-01 NOTE — ED Provider Notes (Signed)
 [place] EMERGENCY DEPARTMENT AT [facility] Provider Note   CSN: [id number] Arrival date & time: 01/01/24  1810     Patient presents with: Tachycardia   Matthew [name] is a 48 y.o. male.   Patient has a history of cardiac surgery as a child.  He has had numerous episodes where he has gone into SVT and has needed cardioversion.  Patient states he is having palpitations now  The history is provided by the patient and medical records. No language interpreter was used.  Palpitations Palpitations quality:  Regular Onset quality:  Sudden Timing:  Constant Progression:  Unchanged Chronicity:  Recurrent Context: not anxiety   Relieved by:  Nothing Worsened by:  Nothing Ineffective treatments:  None tried Associated symptoms: no back pain, no chest pain and no cough   Risk factors: no diabetes mellitus        Prior to Admission medications   Medication Sig Start Date End Date Taking? Authorizing Provider  acetaminophen  (TYLENOL ) 500 MG tablet Take 1,000 mg by mouth every 6 (six) hours as needed for headache (pain).   Yes [provider]  apixaban  (ELIQUIS ) 5 MG TABS tablet Take 1 tablet (5 mg total) by mouth 2 (two) times daily. 12/18/18  Yes Okey Vina GAILS, MD  furosemide  (LASIX ) 20 MG tablet Take 1 tablet (20 mg total) by mouth 2 (two) times daily for 5 days. 08/05/23 01/01/24 Yes Kommor, Madison, MD  hydrOXYzine  (VISTARIL ) 25 MG capsule TAKE 1 CAPSULE BY MOUTH EVERY 8 HOURS AS NEEDED FOR ANXIETY Patient taking differently: Take 25 mg by mouth 3 (three) times daily. 04/26/22  Yes Cook, Jayce G, DO  losartan  (COZAAR ) 25 MG tablet Take 25 mg by mouth daily. 03/10/20  Yes [provider]  metoprolol  succinate (TOPROL -XL) 100 MG 24 hr tablet Take 100 mg by mouth daily. 03/19/20  Yes [provider]  Multiple Vitamin (MULTIVITAMIN WITH MINERALS) TABS tablet Take 1 tablet by mouth daily.   Yes [provider]    Allergies: Aspirin    Review  of Systems  Constitutional:  Negative for appetite change and fatigue.  HENT:  Negative for congestion, ear discharge and sinus pressure.   Eyes:  Negative for discharge.  Respiratory:  Negative for cough.   Cardiovascular:  Positive for palpitations. Negative for chest pain.  Gastrointestinal:  Negative for abdominal pain and diarrhea.  Genitourinary:  Negative for frequency and hematuria.  Musculoskeletal:  Negative for back pain.  Skin:  Negative for rash.  Neurological:  Negative for seizures and headaches.  Psychiatric/Behavioral:  Negative for hallucinations.     Updated Vital Signs BP 113/83   Pulse 74   Temp 97.9 F (36.6 C) (Oral)   Resp 14   Wt 133.8 kg   SpO2 91%   BMI 37.88 kg/m   Physical Exam Vitals and nursing note reviewed.  Constitutional:      Appearance: He is well-developed.  HENT:     Head: Normocephalic.     Nose: Nose normal.   Eyes:     General: No scleral icterus.    Conjunctiva/sclera: Conjunctivae normal.   Neck:     Thyroid: No thyromegaly.   Cardiovascular:     Rate and Rhythm: Regular rhythm. Tachycardia present.     Heart sounds: No murmur heard.    No friction rub. No gallop.  Pulmonary:     Breath sounds: No stridor. No wheezing or rales.  Chest:     Chest wall: No tenderness.  Abdominal:     General: There is no distension.     Tenderness: There is no abdominal tenderness. There is no rebound.   Musculoskeletal:        General: Normal range of motion.     Cervical back: Neck supple.  Lymphadenopathy:     Cervical: No cervical adenopathy.   Skin:    Findings: No erythema or rash.   Neurological:     Mental Status: He is alert and oriented to person, place, and time.     Motor: No abnormal muscle tone.     Coordination: Coordination normal.   Psychiatric:        Behavior: Behavior normal.     (all labs ordered are listed, but only abnormal results are displayed) Labs Reviewed  CBC WITH DIFFERENTIAL/PLATELET -  Abnormal; Notable for the following components:      Result Value   MCH 35.2 (*)    Monocytes Absolute 1.2 (*)    All other components within normal limits  BASIC METABOLIC PANEL WITH GFR - Abnormal; Notable for the following components:   Sodium 134 (*)    Glucose, Bld 177 (*)    BUN <5 (*)    All other components within normal limits    EKG: None  Radiology: [facility] Chest Port 1 View Result Date: 01/01/2024 CLINICAL DATA:  Arrhythmia, weakness EXAM: PORTABLE CHEST 1 VIEW COMPARISON:  08/05/2023 FINDINGS: Stable moderate cardiomegaly. Median sternotomy has been performed. Pulmonary vascularity is normal. Lungs are clear. No pneumothorax. No definite pleural effusion. IMPRESSION: 1. Moderate cardiomegaly. Electronically Signed   By: Dorethia Molt M.D.   On: 01/01/2024 19:44     .Cardioversion  Date/Time: 01/02/2024 12:07 PM  Performed by: Suzette Pac, MD Authorized by: Suzette Pac, MD   Consent:    Consent obtained:  Verbal   Consent given by:  Patient   Risks discussed:  Death and pain Attempt one:    Cardioversion mode:  Synchronous   Shock (Joules):  200   Shock outcome:  Conversion to normal sinus rhythm Comments:     Pt sedated with propofol  and was cardioverted without problems     Medications Ordered in the ED  adenosine  (ADENOCARD ) 6 MG/2ML injection (has no administration in time range)  adenosine  (ADENOCARD ) 6 MG/2ML injection (6 mg  Given 01/01/24 1844)  adenosine  (ADENOCARD ) 6 MG/2ML injection 12 mg (12 mg Intravenous Given 01/01/24 1849)  propofol  (DIPRIVAN ) 10 mg/mL bolus/IV push 66.9 mg (66.9 mg Intravenous Given 01/01/24 1915)  propofol  (DIPRIVAN ) 10 mg/mL bolus/IV push (10 mg Intravenous Given 01/01/24 1917)   CRITICAL CARE Performed by: Pac Suzette Total critical care time: 55 minutes Critical care time was exclusive of separately billable procedures and treating other patients. Critical care was necessary to treat or prevent imminent or  life-threatening deterioration. Critical care was time spent personally by me on the following activities: development of treatment plan with patient and/or surrogate as well as nursing, discussions with consultants, evaluation of patient's response to treatment, examination of patient, obtaining history from patient or surrogate, ordering and performing treatments and interventions, ordering and review of laboratory studies, ordering and review of radiographic studies, pulse oximetry and re-evaluation of patient's condition.   Pt with svt.  Pt given adenosine  twice without response.  Pt cardioveted on 200 joules and went into sinus  Medical Decision Making Amount and/or Complexity of Data Reviewed Labs: ordered. Radiology: ordered.  Risk Prescription drug management.   SVT that has converted with cardioversion.  Patient will follow-up with his cardiologist     Final diagnoses:  SVT (supraventricular tachycardia) [facility])    ED Discharge Orders     None          Suzette Pac, MD 01/02/24 1208

## 2024-01-01 NOTE — ED Triage Notes (Signed)
 Pt reports feeling his heart racing.  Pt reports hx of afib and recently cardioverted for SVT when adenosine  did not work last week.

## 2024-01-01 NOTE — Discharge Instructions (Signed)
Follow up as planned with your cardiologist.

## 2024-02-24 ENCOUNTER — Emergency Department (HOSPITAL_COMMUNITY)
Admission: EM | Admit: 2024-02-24 | Discharge: 2024-02-24 | Disposition: A | Discharge status: Home / Self Care | Payer: Self-pay | Attending: Emergency Medicine | Admitting: Emergency Medicine

## 2024-02-24 DIAGNOSIS — Z7901 Long term (current) use of anticoagulants: Secondary | ICD-10-CM | POA: Insufficient documentation

## 2024-02-24 DIAGNOSIS — E876 Hypokalemia: Secondary | ICD-10-CM | POA: Insufficient documentation

## 2024-02-24 DIAGNOSIS — I471 Supraventricular tachycardia, unspecified: Secondary | ICD-10-CM | POA: Diagnosis not present

## 2024-02-24 DIAGNOSIS — R002 Palpitations: Secondary | ICD-10-CM | POA: Diagnosis present

## 2024-02-24 LAB — CBC WITH DIFFERENTIAL/PLATELET
Abs Immature Granulocytes: 0.02 K/uL (ref 0.00–0.07)
Basophils Absolute: 0.1 K/uL (ref 0.0–0.1)
Basophils Relative: 1 %
Eosinophils Absolute: 0.2 K/uL (ref 0.0–0.5)
Eosinophils Relative: 2 %
HCT: 51.1 % (ref 39.0–52.0)
Hemoglobin: 18 g/dL — ABNORMAL HIGH (ref 13.0–17.0)
Immature Granulocytes: 0 %
Lymphocytes Relative: 23 %
Lymphs Abs: 2.4 K/uL (ref 0.7–4.0)
MCH: 35 pg — ABNORMAL HIGH (ref 26.0–34.0)
MCHC: 35.2 g/dL (ref 30.0–36.0)
MCV: 99.2 fL (ref 80.0–100.0)
Monocytes Absolute: 1.2 K/uL — ABNORMAL HIGH (ref 0.1–1.0)
Monocytes Relative: 12 %
Neutro Abs: 6.4 K/uL (ref 1.7–7.7)
Neutrophils Relative %: 62 %
Platelets: 315 K/uL (ref 150–400)
RBC: 5.15 MIL/uL (ref 4.22–5.81)
RDW: 14.2 % (ref 11.5–15.5)
WBC: 10.2 K/uL (ref 4.0–10.5)
nRBC: 0 % (ref 0.0–0.2)

## 2024-02-24 LAB — TROPONIN I (HIGH SENSITIVITY): Troponin I (High Sensitivity): 6 ng/L (ref <18)

## 2024-02-24 LAB — COMPREHENSIVE METABOLIC PANEL WITH GFR
ALT: 27 U/L (ref 0–44)
AST: 28 U/L (ref 15–41)
Albumin: 4.1 g/dL (ref 3.5–5.0)
Alkaline Phosphatase: 66 U/L (ref 38–126)
Anion gap: 12 (ref 5–15)
BUN: 6 mg/dL (ref 6–20)
CO2: 22 mmol/L (ref 22–32)
Calcium: 9.2 mg/dL (ref 8.9–10.3)
Chloride: 103 mmol/L (ref 98–111)
Creatinine, Ser: 0.65 mg/dL (ref 0.61–1.24)
GFR, Estimated: >60 mL/min (ref >60)
Glucose, Bld: 151 mg/dL — ABNORMAL HIGH (ref 70–99)
Potassium: 3.3 mmol/L — ABNORMAL LOW (ref 3.5–5.1)
Sodium: 137 mmol/L (ref 135–145)
Total Bilirubin: 1.3 mg/dL — ABNORMAL HIGH (ref 0.0–1.2)
Total Protein: 7.8 g/dL (ref 6.5–8.1)

## 2024-02-24 MED ORDER — PROPOFOL 10 MG/ML IV BOLUS
1.0000 mg/kg | Freq: Once | INTRAVENOUS | Status: AC
  Administered 2024-02-24 (×2): 50 mg via INTRAVENOUS
  Filled 2024-02-24: qty 20

## 2024-02-24 MED ORDER — MIDAZOLAM HCL 2 MG/2ML IJ SOLN
2.0000 mg | Freq: Once | INTRAMUSCULAR | Status: AC
  Administered 2024-02-24 (×2): 2 mg via INTRAVENOUS
  Filled 2024-02-24: qty 2

## 2024-02-24 MED ORDER — MIDAZOLAM BOLUS VIA INFUSION: 2.0000 mg | Freq: Once | INTRAVENOUS | Status: DC

## 2024-02-24 MED ORDER — FENTANYL CITRATE (PF) 100 MCG/2ML IJ SOLN
100.0000 ug | Freq: Once | INTRAMUSCULAR | Status: AC
  Administered 2024-02-24 (×2): 100 ug via INTRAVENOUS
  Filled 2024-02-24: qty 2

## 2024-02-24 NOTE — ED Provider Notes (Signed)
 [place] EMERGENCY DEPARTMENT AT [facility] Provider Note   CSN: [id number] Arrival date & time: 02/24/24  1735     Patient presents with: Atrial Fibrillation   Matthew [name] is a 48 y.o. male.    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  He is supposed to have a cardiac MRI in September and a possible ablation after that.  Unfortunately the patient has recurrent episodes of supraventricular tachycardia, they are not amenable to adenosine  treatment as I have tried this in the past, other physicians have tried this in the past and ultimately he has not getting cardioverted.  He was in his usual state of health today, he does admit to having a cocktail when he got home from work as it was his 48 year old's birthday today.  He then noticed shortly thereafter while he was shopping in the [employer] that he had acute onset of palpitations similar to what he has had in the past.  No fevers no vomiting no diarrhea, he has been losing weight by restricting his caloric intake at night and has lost approximately 20 pounds.    Prior to Admission medications   Medication Sig Start Date End Date Taking? Authorizing Provider  acetaminophen  (TYLENOL ) 500 MG tablet Take 1,000 mg by mouth every 6 (six) hours as needed for headache (pain).    [provider]  apixaban  (ELIQUIS ) 5 MG TABS tablet Take 1 tablet (5 mg total) by mouth 2 (two) times daily. 12/18/18   Okey Vina GAILS, MD  furosemide  (LASIX ) 20 MG tablet Take 1 tablet (20 mg total) by mouth 2 (two) times daily for 5 days. 08/05/23 02/23/25  Kommor, Madison, MD  hydrOXYzine  (VISTARIL ) 25 MG capsule TAKE 1 CAPSULE BY MOUTH EVERY 8 HOURS AS NEEDED FOR ANXIETY Patient taking differently: Take 25 mg by mouth 3  (three) times daily. 04/26/22   Cook, Jayce G, DO  losartan  (COZAAR ) 25 MG tablet Take 25 mg by mouth daily. 03/10/20   [provider]  metoprolol  succinate (TOPROL -XL) 100 MG 24 hr tablet Take 100 mg by mouth daily. 03/19/20   [provider]  Multiple Vitamin (MULTIVITAMIN WITH MINERALS) TABS tablet Take 1 tablet by mouth daily.    [provider]    Allergies: Aspirin    Review of Systems  All other systems reviewed and are negative.   Updated Vital Signs BP 127/77 (BP Location: Left Arm)   Pulse (!) 168   Temp 98.4 F (36.9 C) (Oral)   Resp 18   Ht 1.88 m (6' 2)   Wt 133.4 kg   SpO2 93%   BMI 37.75 kg/m   Physical Exam Vitals and nursing note reviewed.  Constitutional:      General: He is in acute distress.     Appearance: He is well-developed.  HENT:     Head: Normocephalic and atraumatic.     Mouth/Throat:     Pharynx: No oropharyngeal exudate.  Eyes:     General: No scleral icterus.       Right eye: No discharge.        Left eye: No discharge.     Conjunctiva/sclera: Conjunctivae normal.     Pupils: Pupils are equal, round, and reactive to light.  Neck:     Thyroid:  No thyromegaly.     Vascular: No JVD.  Cardiovascular:     Rate and Rhythm: Regular rhythm. Tachycardia present.     Heart sounds: Normal heart sounds. No murmur heard.    No friction rub. No gallop.  Pulmonary:     Effort: Pulmonary effort is normal. No respiratory distress.     Breath sounds: Normal breath sounds. No wheezing or rales.  Abdominal:     General: Bowel sounds are normal. There is no distension.     Palpations: Abdomen is soft. There is no mass.     Tenderness: There is no abdominal tenderness.  Musculoskeletal:        General: No tenderness. Normal range of motion.     Cervical back: Normal range of motion and neck supple.     Right lower leg: Edema present.     Left lower leg: Edema present.  Lymphadenopathy:     Cervical: No cervical adenopathy.   Skin:    General: Skin is warm and dry.     Findings: No erythema or rash.  Neurological:     Mental Status: He is alert.     Coordination: Coordination normal.  Psychiatric:        Behavior: Behavior normal.     (all labs ordered are listed, but only abnormal results are displayed) Labs Reviewed  CBC WITH DIFFERENTIAL/PLATELET - Abnormal; Notable for the following components:      Result Value   Hemoglobin 18.0 (*)    MCH 35.0 (*)    Monocytes Absolute 1.2 (*)    All other components within normal limits  COMPREHENSIVE METABOLIC PANEL WITH GFR - Abnormal; Notable for the following components:   Potassium 3.3 (*)    Glucose, Bld 151 (*)    Total Bilirubin 1.3 (*)    All other components within normal limits  TROPONIN I (HIGH SENSITIVITY)    EKG: EKG Interpretation Date/Time:  Tuesday February 24 2024 17:50:19 EDT Ventricular Rate:  167 PR Interval:  128 QRS Duration:  90 QT Interval:  272 QTC Calculation: 453 R Axis:   -11  Text Interpretation: Sinus tachycardia Marked ST abnormality, possible inferior subendocardial injury Marked ST abnormality, possible anterolateral subendocardial injury Abnormal ECG When compared with ECG of 01-Jan-2024 19:21, PREVIOUS ECG IS PRESENT Confirmed by Cleotilde Rogue ([id number]) on 02/24/2024 5:54:36 PM  Radiology: No results found.   .Critical Care  Performed by: Cleotilde Rogue, MD Authorized by: Cleotilde Rogue, MD   Critical care provider statement:    Critical care time (minutes):  30   Critical care time was exclusive of:  Separately billable procedures and treating other patients and teaching time   Critical care was necessary to treat or prevent imminent or life-threatening deterioration of the following conditions:  Cardiac failure   Critical care was time spent personally by me on the following activities:  Development of treatment plan with patient or surrogate, discussions with consultants, evaluation of patient's response to  treatment, examination of patient, ordering and review of laboratory studies, ordering and review of radiographic studies, ordering and performing treatments and interventions, pulse oximetry, re-evaluation of patient's condition, review of old charts and obtaining history from patient or surrogate   I assumed direction of critical care for this patient from another provider in my specialty: no   Comments:       .Cardioversion  Date/Time: 02/24/2024 6:52 PM  Performed by: Cleotilde Rogue, MD Authorized by: Cleotilde Rogue, MD   Consent:    Consent obtained:  Verbal   Consent given by:  Patient   Risks discussed:  Cutaneous burn, death, induced arrhythmia and pain   Alternatives discussed:  Rate-control medication and alternative treatment Pre-procedure details:    Cardioversion basis:  Emergent   Rhythm:  Supraventricular tachycardia   Electrode placement:  Anterior-posterior Patient sedated: Yes. Refer to sedation procedure documentation for details of sedation.  Attempt one:    Cardioversion mode:  Synchronous   Waveform:  Biphasic   Shock (Joules):  200   Shock outcome:  Conversion to normal sinus rhythm Post-procedure details:    Patient status:  Awake   Patient tolerance of procedure:  Tolerated well, no immediate complications Comments:        .Sedation  Date/Time: 02/24/2024 6:53 PM  Performed by: Cleotilde Rogue, MD Authorized by: Cleotilde Rogue, MD   Consent:    Consent obtained:  Verbal and emergent situation   Consent given by:  Patient   Risks discussed:  Allergic reaction, prolonged hypoxia resulting in organ damage, dysrhythmia, prolonged sedation necessitating reversal, respiratory compromise necessitating ventilatory assistance and intubation, vomiting, nausea and inadequate sedation   Alternatives discussed:  Analgesia without sedation Universal protocol:    Procedure explained and questions answered to patient or proxy's satisfaction: yes     Immediately prior  to procedure, a time out was called: yes     Patient identity confirmed:  Verbally with patient and provided demographic data Indications:    Procedure performed:  Cardioversion   Procedure necessitating sedation performed by:  Physician performing sedation Pre-sedation assessment:    Time since last food or drink:  2   ASA classification: class 3 - patient with severe systemic disease     Mouth opening:  3 or more finger widths   Mallampati score:  II - soft palate, uvula, fauces visible   Neck mobility: normal     Pre-sedation assessments completed and reviewed: airway patency, cardiovascular function, hydration status, mental status, nausea/vomiting, pain level, respiratory function and temperature   A pre-sedation assessment was completed prior to the start of the procedure Immediate pre-procedure details:    Reviewed: vital signs, relevant labs/tests and NPO status     Verified: bag valve mask available, emergency equipment available, intubation equipment available, IV patency confirmed, oxygen available and suction available   Procedure details (see MAR for exact dosages):    Preoxygenation:  Nasal cannula   Sedation:  Midazolam  and propofol    Intended level of sedation: deep   Analgesia:  Fentanyl    Intra-procedure monitoring:  Blood pressure monitoring, cardiac monitor, continuous capnometry, continuous pulse oximetry, frequent LOC assessments and frequent vital sign checks   Intra-procedure events: none     Total Provider sedation time (minutes):  12 Post-procedure details:   A post-sedation assessment was completed following the completion of the procedure.   Attendance: Constant attendance by certified staff until patient recovered     Recovery: Patient returned to pre-procedure baseline     Post-sedation assessments completed and reviewed: airway patency, cardiovascular function, hydration status, mental status, nausea/vomiting, pain level, respiratory function and temperature      Patient is stable for discharge or admission: yes     Procedure completion:  Tolerated well, no immediate complications Comments:          Medications Ordered in the ED  fentaNYL  (SUBLIMAZE ) injection 100 mcg (100 mcg Intravenous Given by Other 02/24/24 1841)  propofol  (DIPRIVAN ) 10 mg/mL bolus/IV push 133.4 mg (50 mg Intravenous Given by Other 02/24/24 1846)  midazolam  (VERSED )  injection 2 mg (2 mg Intravenous Given 02/24/24 1843)                                    Medical Decision Making Amount and/or Complexity of Data Reviewed Labs: ordered.  Risk Prescription drug management.    This patient presents to the ED for concern of tachycardia, this involves an extensive number of treatment options, and is a complaint that carries with it a high risk of complications and morbidity.  The differential diagnosis includes likely primary arrhythmia secondary to chronic heart patient, doubt ischemic process, he has had no other ischemic symptoms, he does not appear to be dehydrated, he did have some alcohol which may have thrown him into this arrhythmia as well.   Co morbidities / Chronic conditions that complicate the patient evaluation  Chronic heart disease   Additional history obtained:  Additional history obtained from EMR External records from outside source obtained and reviewed including medical record, [facility] cardiology follow-ups, prior cardioversions, I personally cardioverted this patient back in June   Lab Tests:  I Ordered, and personally interpreted labs.  The pertinent results include: Labs unremarkable including CBC and metabolic panel with only mild hypokalemia    Cardiac Monitoring: / EKG:  The patient was maintained on a cardiac monitor.  I personally viewed and interpreted the cardiac monitored which showed an underlying rhythm of: SVT, tacky arrhythmia at 160 bpm   Problem List / ED Course / Critical interventions / Medication management  Patient did  very well with cardioversion on the first attempt I ordered medication including etomidate , propofol , fentanyl  Reevaluation of the patient after these medicines showed that the patient appropriately sedated I have reviewed the patients home medicines and have made adjustments as needed   Has follow-up with cardiology at [facility]  Social Determinants of Health:   Counseled on alcohol use    Test / Admission - Considered:  Considered admission but the patient had the arrhythmia terminated with the appropriate cardioversion.  Critically ill but improved after critical intervention  The patient is returned to his baseline, at 7:20 PM, the patient requests discharge and has a ride, stable for discharge at this time, not requiring oxygen, no further clinical findings of sedation at this time, no recurrent tacky arrhythmias after successful cardioversion      Final diagnoses:  SVT (supraventricular tachycardia) [facility])    ED Discharge Orders     None          Cleotilde Rogue, MD 02/24/24 1920

## 2024-02-24 NOTE — ED Triage Notes (Signed)
 Pt comes in for a-fib, palpitations and some left sided CP (related to anxiety). Pt has hx of a-fib and has been shocked multiple times for this. Pt states it started 30 mins ago. Pt is under stress due to new job.    A&Ox4.

## 2024-02-24 NOTE — Discharge Instructions (Addendum)
 Please make sure that you follow-up with your cardiologist, hopefully they are able to consider other options for you to help prevent you from going into this type of rhythm, I would call them tomorrow morning to expedite your follow-up, I would prefer if you were seen this week.  Return to the ER for severe worsening symptoms.  Please continue to take your metoprolol  and your Eliquis , do not miss any doses

## 2024-04-14 ENCOUNTER — Encounter (HOSPITAL_COMMUNITY): Payer: Self-pay

## 2024-04-14 ENCOUNTER — Other Ambulatory Visit: Payer: Self-pay

## 2024-04-14 ENCOUNTER — Emergency Department (HOSPITAL_COMMUNITY)
Admission: EM | Admit: 2024-04-14 | Discharge: 2024-04-14 | Disposition: A | Discharge status: Home / Self Care | Attending: Emergency Medicine | Admitting: Emergency Medicine

## 2024-04-14 DIAGNOSIS — Z7901 Long term (current) use of anticoagulants: Secondary | ICD-10-CM | POA: Diagnosis not present

## 2024-04-14 DIAGNOSIS — R002 Palpitations: Secondary | ICD-10-CM | POA: Diagnosis present

## 2024-04-14 DIAGNOSIS — I471 Supraventricular tachycardia, unspecified: Secondary | ICD-10-CM | POA: Insufficient documentation

## 2024-04-14 LAB — CBC WITH DIFFERENTIAL/PLATELET
Abs Immature Granulocytes: 0.03 K/uL (ref 0.00–0.07)
Basophils Absolute: 0 K/uL (ref 0.0–0.1)
Basophils Relative: 0 %
Eosinophils Absolute: 0.1 K/uL (ref 0.0–0.5)
Eosinophils Relative: 1 %
HCT: 53.4 % — ABNORMAL HIGH (ref 39.0–52.0)
Hemoglobin: 18.1 g/dL — ABNORMAL HIGH (ref 13.0–17.0)
Immature Granulocytes: 0 %
Lymphocytes Relative: 26 %
Lymphs Abs: 2.8 K/uL (ref 0.7–4.0)
MCH: 33.1 pg (ref 26.0–34.0)
MCHC: 33.9 g/dL (ref 30.0–36.0)
MCV: 97.6 fL (ref 80.0–100.0)
Monocytes Absolute: 1 K/uL (ref 0.1–1.0)
Monocytes Relative: 9 %
Neutro Abs: 6.8 K/uL (ref 1.7–7.7)
Neutrophils Relative %: 64 %
Platelets: 270 K/uL (ref 150–400)
RBC: 5.47 MIL/uL (ref 4.22–5.81)
RDW: 13.2 % (ref 11.5–15.5)
WBC: 10.6 K/uL — ABNORMAL HIGH (ref 4.0–10.5)
nRBC: 0 % (ref 0.0–0.2)

## 2024-04-14 LAB — TROPONIN T, HIGH SENSITIVITY
Troponin T High Sensitivity: <15 ng/L (ref 0–19)
Troponin T High Sensitivity: <15 ng/L (ref 0–19)

## 2024-04-14 LAB — COMPREHENSIVE METABOLIC PANEL WITH GFR
ALT: 45 U/L — ABNORMAL HIGH (ref 0–44)
AST: 44 U/L — ABNORMAL HIGH (ref 15–41)
Albumin: 4.4 g/dL (ref 3.5–5.0)
Alkaline Phosphatase: 81 U/L (ref 38–126)
Anion gap: 16 — ABNORMAL HIGH (ref 5–15)
BUN: <5 mg/dL — ABNORMAL LOW (ref 6–20)
CO2: 23 mmol/L (ref 22–32)
Calcium: 9.5 mg/dL (ref 8.9–10.3)
Chloride: 99 mmol/L (ref 98–111)
Creatinine, Ser: 0.85 mg/dL (ref 0.61–1.24)
GFR, Estimated: >60 mL/min (ref >60)
Glucose, Bld: 147 mg/dL — ABNORMAL HIGH (ref 70–99)
Potassium: 4.7 mmol/L (ref 3.5–5.1)
Sodium: 138 mmol/L (ref 135–145)
Total Bilirubin: 0.7 mg/dL (ref 0.0–1.2)
Total Protein: 7.9 g/dL (ref 6.5–8.1)

## 2024-04-14 MED ORDER — FENTANYL CITRATE (PF) 100 MCG/2ML IJ SOLN
INTRAMUSCULAR | Status: DC | PRN
  Administered 2024-04-14: 50 ug via INTRAVENOUS

## 2024-04-14 MED ORDER — PROPOFOL 10 MG/ML IV BOLUS
INTRAVENOUS | Status: DC | PRN
Start: 2024-04-14 — End: 2024-04-14
  Administered 2024-04-14: 20 mg via INTRAVENOUS
  Administered 2024-04-14: 50 mg via INTRAVENOUS

## 2024-04-14 MED ORDER — FENTANYL CITRATE (PF) 100 MCG/2ML IJ SOLN
50.0000 ug | Freq: Once | INTRAMUSCULAR | Status: DC
  Filled 2024-04-14: qty 2

## 2024-04-14 MED ORDER — PROPOFOL 10 MG/ML IV BOLUS
INTRAVENOUS | Status: AC
  Filled 2024-04-14: qty 20

## 2024-04-14 MED ORDER — MIDAZOLAM HCL 2 MG/2ML IJ SOLN
INTRAMUSCULAR | Status: DC | PRN
  Administered 2024-04-14: 2 mg via INTRAVENOUS

## 2024-04-14 MED ORDER — MIDAZOLAM HCL 2 MG/2ML IJ SOLN
2.0000 mg | Freq: Once | INTRAMUSCULAR | Status: DC
Start: 2024-04-14 — End: 2024-04-14
  Filled 2024-04-14: qty 2

## 2024-04-14 MED ORDER — PROPOFOL BOLUS VIA INFUSION: 50.0000 mg | Freq: Once | INTRAVENOUS | Status: DC

## 2024-04-14 MED ORDER — AMIODARONE HCL 200 MG PO TABS: 200.0000 mg | ORAL_TABLET | Freq: Two times a day (BID) | ORAL | 1 refills | Status: DC

## 2024-04-14 NOTE — ED Notes (Signed)
 Pt has already been cardioverted 3 times this year, MD aware.

## 2024-04-14 NOTE — ED Notes (Signed)
 Applied AED pads, MD at bedside.

## 2024-04-14 NOTE — ED Provider Notes (Addendum)
 [place] EMERGENCY DEPARTMENT AT [facility] Provider Note   CSN: [id number] Arrival date & time: 04/14/24  1050     Patient presents with: Atrial Fibrillation   Matthew [name] is a 48 y.o. male.   Patient has a history of heart surgery and has had SVT many times and cardioverted.  Patient complains of palpitations  The history is provided by the patient and medical records. No language interpreter was used.  Palpitations Palpitations quality:  Regular Onset quality:  Sudden Timing:  Constant Progression:  Worsening Chronicity:  Recurrent Context: not anxiety   Relieved by:  Nothing Worsened by:  Nothing Associated symptoms: no back pain, no chest pain and no cough   Risk factors: no diabetes mellitus        Prior to Admission medications   Medication Sig Start Date End Date Taking? Authorizing Provider  amiodarone  (PACERONE ) 200 MG tablet Take 1 tablet (200 mg total) by mouth 2 (two) times daily. 04/14/24  Yes Suzette Pac, MD  acetaminophen  (TYLENOL ) 500 MG tablet Take 1,000 mg by mouth every 6 (six) hours as needed for headache (pain).    [provider]  apixaban  (ELIQUIS ) 5 MG TABS tablet Take 1 tablet (5 mg total) by mouth 2 (two) times daily. 12/18/18   Okey Vina GAILS, MD  furosemide  (LASIX ) 20 MG tablet Take 1 tablet (20 mg total) by mouth 2 (two) times daily for 5 days. 08/05/23 02/23/25  Kommor, Madison, MD  hydrOXYzine  (VISTARIL ) 25 MG capsule TAKE 1 CAPSULE BY MOUTH EVERY 8 HOURS AS NEEDED FOR ANXIETY Patient taking differently: Take 25 mg by mouth 3 (three) times daily. 04/26/22   Cook, Jayce G, DO  losartan  (COZAAR ) 25 MG tablet Take 25 mg by mouth daily. 03/10/20   [provider]  metoprolol  succinate (TOPROL -XL) 100 MG 24 hr tablet Take 100 mg by mouth daily. 03/19/20   [provider]  Multiple Vitamin (MULTIVITAMIN WITH MINERALS) TABS tablet Take 1 tablet by mouth daily.    [provider]    Allergies: Aspirin     Review of Systems  Constitutional:  Negative for appetite change and fatigue.  HENT:  Negative for congestion, ear discharge and sinus pressure.   Eyes:  Negative for discharge.  Respiratory:  Negative for cough.   Cardiovascular:  Positive for palpitations. Negative for chest pain.  Gastrointestinal:  Negative for abdominal pain and diarrhea.  Genitourinary:  Negative for frequency and hematuria.  Musculoskeletal:  Negative for back pain.  Skin:  Negative for rash.  Neurological:  Negative for seizures and headaches.  Psychiatric/Behavioral:  Negative for hallucinations.     Updated Vital Signs BP (!) 127/92 (BP Location: Left Arm)   Pulse 81   Temp 98 F (36.7 C) (Oral)   Resp 18   Ht 6' 2 (1.88 m)   Wt 133.8 kg   SpO2 97%   BMI 37.88 kg/m   Physical Exam Vitals and nursing note reviewed.  Constitutional:      Appearance: He is well-developed.  HENT:     Head: Normocephalic.     Nose: Nose normal.  Eyes:     General: No scleral icterus.    Conjunctiva/sclera: Conjunctivae normal.  Neck:     Thyroid: No thyromegaly.  Cardiovascular:     Rate and Rhythm: Regular rhythm. Tachycardia present.     Heart sounds: No murmur heard.    No friction rub. No gallop.  Pulmonary:     Breath sounds: No stridor.  No wheezing or rales.  Chest:     Chest wall: No tenderness.  Abdominal:     General: There is no distension.     Tenderness: There is no abdominal tenderness. There is no rebound.  Musculoskeletal:        General: Normal range of motion.     Cervical back: Neck supple.  Lymphadenopathy:     Cervical: No cervical adenopathy.  Skin:    Findings: No erythema or rash.  Neurological:     Mental Status: He is alert and oriented to person, place, and time.     Motor: No abnormal muscle tone.     Coordination: Coordination normal.  Psychiatric:        Behavior: Behavior normal.     (all labs ordered are listed, but only abnormal results are displayed) Labs  Reviewed  CBC WITH DIFFERENTIAL/PLATELET - Abnormal; Notable for the following components:      Result Value   WBC 10.6 (*)    Hemoglobin 18.1 (*)    HCT 53.4 (*)    All other components within normal limits  COMPREHENSIVE METABOLIC PANEL WITH GFR - Abnormal; Notable for the following components:   Glucose, Bld 147 (*)    BUN <5 (*)    AST 44 (*)    ALT 45 (*)    Anion gap 16 (*)    All other components within normal limits  TROPONIN T, HIGH SENSITIVITY  TROPONIN T, HIGH SENSITIVITY    EKG: None  Radiology: No results found.   .Cardioversion  Date/Time: 04/14/2024 5:14 PM  Performed by: Suzette Pac, MD Authorized by: Suzette Pac, MD   Consent:    Consent obtained:  Verbal   Consent given by:  Patient Attempt one:    Cardioversion mode:  Synchronous   Shock (Joules):  200 Comments:     Patient was cardioverted at 200 J 1 time and returned to sinus rhythm  .Sedation  Date/Time: 04/14/2024 5:15 PM  Performed by: Suzette Pac, MD Authorized by: Suzette Pac, MD   Consent:    Consent obtained:  Verbal   Consent given by:  Patient Universal protocol:    Immediately prior to procedure, a time out was called: yes   Indications:    Procedure performed:  Cardioversion Pre-sedation assessment:    Time since last food or drink:  2 hours prior   ASA classification: class 2 - patient with mild systemic disease     Mallampati score:  II - soft palate, uvula, fauces visible   Pre-sedation assessments completed and reviewed: airway patency   A pre-sedation assessment was completed prior to the start of the procedure Procedure details (see MAR for exact dosages):    Sedation:  Propofol    Intended level of sedation: deep   Total Provider sedation time (minutes):  35 Post-procedure details:   A post-sedation assessment was completed following the completion of the procedure. Comments:     Patient received propofol , fentanyl , and Versed  and tolerated the procedure  well    Medications Ordered in the ED  fentaNYL  (SUBLIMAZE ) injection (50 mcg Intravenous Given 04/14/24 1127)  midazolam  (VERSED ) injection (2 mg Intravenous Given 04/14/24 1130)  propofol  (DIPRIVAN ) 10 mg/mL bolus/IV push ( Intravenous Given 04/14/24 1148)   I spoke with cardiology and it was suggested to start the patient on amiodarone    CRITICAL CARE Performed by: Pac Suzette Total critical care time: 45 minutes Critical care time was exclusive of separately billable procedures and treating other patients. Critical  care was necessary to treat or prevent imminent or life-threatening deterioration. Critical care was time spent personally by me on the following activities: development of treatment plan with patient and/or surrogate as well as nursing, discussions with consultants, evaluation of patient's response to treatment, examination of patient, obtaining history from patient or surrogate, ordering and performing treatments and interventions, ordering and review of laboratory studies, ordering and review of radiographic studies, pulse oximetry and re-evaluation of patient's condition.                              Medical Decision Making Amount and/or Complexity of Data Reviewed Labs: ordered.  Risk Prescription drug management.   SVT with multiple cardioversions.  Patient will add amiodarone  and follow-up with cardiology     Final diagnoses:  SVT (supraventricular tachycardia)    ED Discharge Orders          Ordered    amiodarone  (PACERONE ) 200 MG tablet  2 times daily        04/14/24 1527    Ambulatory referral to Cardiology       Comments: If you have not heard from the Cardiology office within the next 72 hours please call [phone].   04/14/24 1527               Suzette Pac, MD 04/14/24 1716    Suzette Pac, MD 04/30/24 1544

## 2024-04-14 NOTE — ED Notes (Signed)
 Pt in bed, pt states that he has someone to come get him, pt denies pain, resps even and unlabored, read and reviewed d/c instructions and follow up, advised to return for any concerns or worsening symptoms. Pt ambulatory from department with steady gait.

## 2024-04-14 NOTE — Sedation Documentation (Signed)
 200J synchronized cardioversion performed with successful conversion to NSR at 86bpm

## 2024-04-14 NOTE — ED Notes (Signed)
 Aldrete now 10 GCS 15 Pt denies complaints of pain

## 2024-04-14 NOTE — ED Triage Notes (Signed)
 Pt arrived via pOV c/o atrial fibrillation that began to flare up on him yesterday. Pt reports 3 cardioversion's this year for this. Pt reports HR at home PTA was 184 BPM. Pt denies CP.

## 2024-04-14 NOTE — Discharge Instructions (Signed)
 Start taking the new medicine twice a day and follow-up with the cardiologist here

## 2024-04-19 ENCOUNTER — Inpatient Hospital Stay (HOSPITAL_COMMUNITY)
Admission: EM | Admit: 2024-04-19 | Discharge: 2024-04-22 | DRG: 291 | Disposition: A | Discharge status: Home / Self Care | Attending: Family Medicine | Admitting: Family Medicine

## 2024-04-19 ENCOUNTER — Encounter (HOSPITAL_COMMUNITY): Payer: Self-pay | Admitting: *Deleted

## 2024-04-19 ENCOUNTER — Emergency Department (HOSPITAL_COMMUNITY)

## 2024-04-19 ENCOUNTER — Other Ambulatory Visit: Payer: Self-pay

## 2024-04-19 DIAGNOSIS — Z7901 Long term (current) use of anticoagulants: Secondary | ICD-10-CM | POA: Diagnosis not present

## 2024-04-19 DIAGNOSIS — Z8774 Personal history of (corrected) congenital malformations of heart and circulatory system: Secondary | ICD-10-CM | POA: Diagnosis not present

## 2024-04-19 DIAGNOSIS — I110 Hypertensive heart disease with heart failure: Principal | ICD-10-CM | POA: Diagnosis present

## 2024-04-19 DIAGNOSIS — E876 Hypokalemia: Secondary | ICD-10-CM | POA: Diagnosis present

## 2024-04-19 DIAGNOSIS — I082 Rheumatic disorders of both aortic and tricuspid valves: Secondary | ICD-10-CM | POA: Diagnosis present

## 2024-04-19 DIAGNOSIS — I4891 Unspecified atrial fibrillation: Secondary | ICD-10-CM | POA: Diagnosis present

## 2024-04-19 DIAGNOSIS — I272 Pulmonary hypertension, unspecified: Secondary | ICD-10-CM | POA: Diagnosis present

## 2024-04-19 DIAGNOSIS — Z888 Allergy status to other drugs, medicaments and biological substances status: Secondary | ICD-10-CM

## 2024-04-19 DIAGNOSIS — I5023 Acute on chronic systolic (congestive) heart failure: Secondary | ICD-10-CM | POA: Diagnosis present

## 2024-04-19 DIAGNOSIS — Z8249 Family history of ischemic heart disease and other diseases of the circulatory system: Secondary | ICD-10-CM | POA: Diagnosis not present

## 2024-04-19 DIAGNOSIS — I484 Atypical atrial flutter: Secondary | ICD-10-CM | POA: Diagnosis present

## 2024-04-19 DIAGNOSIS — D72829 Elevated white blood cell count, unspecified: Secondary | ICD-10-CM | POA: Diagnosis present

## 2024-04-19 DIAGNOSIS — J9601 Acute respiratory failure with hypoxia: Principal | ICD-10-CM | POA: Diagnosis present

## 2024-04-19 DIAGNOSIS — F419 Anxiety disorder, unspecified: Secondary | ICD-10-CM | POA: Diagnosis present

## 2024-04-19 DIAGNOSIS — I509 Heart failure, unspecified: Secondary | ICD-10-CM

## 2024-04-19 DIAGNOSIS — F32A Depression, unspecified: Secondary | ICD-10-CM | POA: Diagnosis present

## 2024-04-19 DIAGNOSIS — Z79899 Other long term (current) drug therapy: Secondary | ICD-10-CM | POA: Diagnosis not present

## 2024-04-19 DIAGNOSIS — E785 Hyperlipidemia, unspecified: Secondary | ICD-10-CM | POA: Diagnosis present

## 2024-04-19 DIAGNOSIS — Q2546 Tortuous aortic arch: Secondary | ICD-10-CM | POA: Diagnosis not present

## 2024-04-19 DIAGNOSIS — Z1152 Encounter for screening for COVID-19: Secondary | ICD-10-CM | POA: Diagnosis not present

## 2024-04-19 DIAGNOSIS — Z7984 Long term (current) use of oral hypoglycemic drugs: Secondary | ICD-10-CM

## 2024-04-19 DIAGNOSIS — I480 Paroxysmal atrial fibrillation: Secondary | ICD-10-CM | POA: Diagnosis present

## 2024-04-19 DIAGNOSIS — I471 Supraventricular tachycardia, unspecified: Secondary | ICD-10-CM | POA: Diagnosis present

## 2024-04-19 DIAGNOSIS — Z6836 Body mass index (BMI) 36.0-36.9, adult: Secondary | ICD-10-CM

## 2024-04-19 DIAGNOSIS — E66812 Obesity, class 2: Secondary | ICD-10-CM | POA: Diagnosis present

## 2024-04-19 DIAGNOSIS — I10 Essential (primary) hypertension: Secondary | ICD-10-CM | POA: Diagnosis present

## 2024-04-19 DIAGNOSIS — Z886 Allergy status to analgesic agent status: Secondary | ICD-10-CM

## 2024-04-19 DIAGNOSIS — F17290 Nicotine dependence, other tobacco product, uncomplicated: Secondary | ICD-10-CM | POA: Diagnosis present

## 2024-04-19 LAB — RESP PANEL BY RT-PCR (RSV, FLU A&B, COVID)  RVPGX2
Influenza A by PCR: NEGATIVE
Influenza B by PCR: NEGATIVE
Resp Syncytial Virus by PCR: NEGATIVE
SARS Coronavirus 2 by RT PCR: NEGATIVE

## 2024-04-19 LAB — COMPREHENSIVE METABOLIC PANEL WITH GFR
ALT: 20 U/L (ref 0–44)
AST: 17 U/L (ref 15–41)
Albumin: 4.3 g/dL (ref 3.5–5.0)
Alkaline Phosphatase: 55 U/L (ref 38–126)
Anion gap: 19 — ABNORMAL HIGH (ref 5–15)
BUN: 9 mg/dL (ref 6–20)
CO2: 21 mmol/L — ABNORMAL LOW (ref 22–32)
Calcium: 9.4 mg/dL (ref 8.9–10.3)
Chloride: 99 mmol/L (ref 98–111)
Creatinine, Ser: 0.88 mg/dL (ref 0.61–1.24)
GFR, Estimated: >60 mL/min (ref >60)
Glucose, Bld: 172 mg/dL — ABNORMAL HIGH (ref 70–99)
Potassium: 3 mmol/L — ABNORMAL LOW (ref 3.5–5.1)
Sodium: 138 mmol/L (ref 135–145)
Total Bilirubin: 1.1 mg/dL (ref 0.0–1.2)
Total Protein: 7.6 g/dL (ref 6.5–8.1)

## 2024-04-19 LAB — CBC WITH DIFFERENTIAL/PLATELET
Abs Immature Granulocytes: 0.08 K/uL — ABNORMAL HIGH (ref 0.00–0.07)
Basophils Absolute: 0.1 K/uL (ref 0.0–0.1)
Basophils Relative: 0 %
Eosinophils Absolute: 0.2 K/uL (ref 0.0–0.5)
Eosinophils Relative: 1 %
HCT: 44.1 % (ref 39.0–52.0)
Hemoglobin: 14.8 g/dL (ref 13.0–17.0)
Immature Granulocytes: 1 %
Lymphocytes Relative: 15 %
Lymphs Abs: 2.3 K/uL (ref 0.7–4.0)
MCH: 33.6 pg (ref 26.0–34.0)
MCHC: 33.6 g/dL (ref 30.0–36.0)
MCV: 100.2 fL — ABNORMAL HIGH (ref 80.0–100.0)
Monocytes Absolute: 1.7 K/uL — ABNORMAL HIGH (ref 0.1–1.0)
Monocytes Relative: 11 %
Neutro Abs: 11 K/uL — ABNORMAL HIGH (ref 1.7–7.7)
Neutrophils Relative %: 72 %
Platelets: 252 K/uL (ref 150–400)
RBC: 4.4 MIL/uL (ref 4.22–5.81)
RDW: 13.4 % (ref 11.5–15.5)
WBC: 15.2 K/uL — ABNORMAL HIGH (ref 4.0–10.5)
nRBC: 0 % (ref 0.0–0.2)

## 2024-04-19 LAB — TROPONIN T, HIGH SENSITIVITY
Troponin T High Sensitivity: <15 ng/L (ref 0–19)
Troponin T High Sensitivity: <15 ng/L (ref 0–19)

## 2024-04-19 LAB — HIV ANTIBODY (ROUTINE TESTING W REFLEX): HIV Screen 4th Generation wRfx: NONREACTIVE

## 2024-04-19 LAB — TSH: TSH: 2.21 u[IU]/mL (ref 0.350–4.500)

## 2024-04-19 LAB — PRO BRAIN NATRIURETIC PEPTIDE: Pro Brain Natriuretic Peptide: 1007 pg/mL — ABNORMAL HIGH

## 2024-04-19 MED ORDER — ONDANSETRON HCL 4 MG PO TABS: 4.0000 mg | ORAL_TABLET | Freq: Four times a day (QID) | ORAL | Status: DC | PRN

## 2024-04-19 MED ORDER — HYDROXYZINE HCL 25 MG PO TABS
25.0000 mg | ORAL_TABLET | Freq: Once | ORAL | Status: AC
  Administered 2024-04-19: 25 mg via ORAL
  Filled 2024-04-19: qty 1

## 2024-04-19 MED ORDER — DOCUSATE SODIUM 100 MG PO CAPS
100.0000 mg | ORAL_CAPSULE | Freq: Two times a day (BID) | ORAL | Status: DC
  Administered 2024-04-19 – 2024-04-22 (×7): 100 mg via ORAL
  Filled 2024-04-19 (×7): qty 1

## 2024-04-19 MED ORDER — POTASSIUM CHLORIDE 20 MEQ PO PACK
40.0000 meq | PACK | Freq: Once | ORAL | Status: AC
  Administered 2024-04-19: 40 meq via ORAL
  Filled 2024-04-19: qty 2

## 2024-04-19 MED ORDER — AMIODARONE HCL 200 MG PO TABS
200.0000 mg | ORAL_TABLET | Freq: Two times a day (BID) | ORAL | Status: DC
  Administered 2024-04-19 – 2024-04-22 (×7): 200 mg via ORAL
  Filled 2024-04-19 (×7): qty 1

## 2024-04-19 MED ORDER — FUROSEMIDE 10 MG/ML IJ SOLN
80.0000 mg | Freq: Once | INTRAMUSCULAR | Status: AC
  Administered 2024-04-19: 80 mg via INTRAVENOUS
  Filled 2024-04-19: qty 8

## 2024-04-19 MED ORDER — APIXABAN 5 MG PO TABS
5.0000 mg | ORAL_TABLET | Freq: Two times a day (BID) | ORAL | Status: DC
  Administered 2024-04-19 – 2024-04-22 (×7): 5 mg via ORAL
  Filled 2024-04-19 (×7): qty 1

## 2024-04-19 MED ORDER — FUROSEMIDE 10 MG/ML IJ SOLN
40.0000 mg | Freq: Two times a day (BID) | INTRAMUSCULAR | Status: DC
  Administered 2024-04-19 – 2024-04-22 (×6): 40 mg via INTRAVENOUS
  Filled 2024-04-19 (×6): qty 4

## 2024-04-19 MED ORDER — LOSARTAN POTASSIUM 25 MG PO TABS
25.0000 mg | ORAL_TABLET | Freq: Every day | ORAL | Status: DC
  Administered 2024-04-19 – 2024-04-22 (×4): 25 mg via ORAL
  Filled 2024-04-19 (×4): qty 1

## 2024-04-19 MED ORDER — METOPROLOL SUCCINATE ER 50 MG PO TB24
100.0000 mg | ORAL_TABLET | Freq: Every day | ORAL | Status: DC
  Administered 2024-04-19 – 2024-04-22 (×4): 100 mg via ORAL
  Filled 2024-04-19 (×4): qty 2

## 2024-04-19 MED ORDER — ACETAMINOPHEN 325 MG PO TABS
650.0000 mg | ORAL_TABLET | Freq: Four times a day (QID) | ORAL | Status: DC | PRN
  Administered 2024-04-21: 650 mg via ORAL
  Filled 2024-04-19: qty 2

## 2024-04-19 MED ORDER — ACETAMINOPHEN 650 MG RE SUPP: 650.0000 mg | Freq: Four times a day (QID) | RECTAL | Status: DC | PRN

## 2024-04-19 MED ORDER — GUAIFENESIN-DM 100-10 MG/5ML PO SYRP
5.0000 mL | ORAL_SOLUTION | ORAL | Status: DC | PRN
  Administered 2024-04-19 – 2024-04-21 (×6): 5 mL via ORAL
  Filled 2024-04-19 (×6): qty 5

## 2024-04-19 MED ORDER — ONDANSETRON HCL 4 MG/2ML IJ SOLN: 4.0000 mg | Freq: Four times a day (QID) | INTRAMUSCULAR | Status: DC | PRN

## 2024-04-19 NOTE — H&P (Addendum)
 History and Physical    Matthew [name] FMW:[id number] DOB: Jul 26, 1975 DOA: 04/19/2024  PCP: Cook, Jayce G, DO   Patient coming from: Home  I have personally briefly reviewed patient's old medical records in [facility]  Chief Complaint: Shortness of breath on exertion and lying flat.  HPI: Matthew [name] is a 48 y.o. male with PMH significant for transposition of great vessels, s/p repair with mustard procedure, hypertension, hyperlipidemia, pulmonary hypertension, A-fib on Eliquis , anxiety/depression, HFr EF(recent echo 35 to 40% in 2017) presented in the ED with complaints of shortness of breath for few days.  Patient was seen in the ED a week ago with SVT, he underwent cardioversion and was discharged on amiodarone .  Patient has been adherent to the amiodarone  and other home medications.  Patient reports he has developed shortness of breath for last 3 days, unable to lie flat,  has been using 2 pillows to sleep.  He denies any sick contacts or recent travel.  ED Course: He was hemodynamically stable except hypertension and hypoxia.   Temp 97.9, HR 83, RR 20, BP 141/76, SpO2 88% on room air requiring 2 L of supplemental oxygen. Labs include sodium 138, potassium 3.0, chloride 99, bicarb 21, glucose 172, BUN 9, creatinine 0.88, calcium  9.4, anion gap 19, alkaline phosphatase 55, albumin 4.3, AST 17, ALT 20, total protein 7.6, proBNP 1007, troponin 15 x 3, WBC 15.2, hemoglobin 14.8, head to great 44.1, platelet 252, COVID, influenza, RSV negative, CXR: Cardiomegaly with perihilar vascular engorgement and interstitial edema of the lung fields with small pleural effusions. Findings consistent with CHF or fluid overload.  EKG shows normal sinus rhythm, short PR interval.  Review of Systems:  Review of Systems  Constitutional:  Positive for malaise/fatigue.  HENT: Negative.    Eyes: Negative.   Respiratory:  Positive for cough and shortness of breath.   Cardiovascular:  Positive  for orthopnea.  Gastrointestinal: Negative.   Genitourinary: Negative.   Musculoskeletal: Negative.   Skin: Negative.   Neurological: Negative.   Endo/Heme/Allergies: Negative.   Psychiatric/Behavioral:  Positive for depression. The patient is nervous/anxious.     Past Medical History:  Diagnosis Date   A-fib [facility])    Alcohol abuse    Anxiety    Atypical atrial flutter (HCC)    Complete transposition of great vessels    Depression    Dyslipidemia    GERD (gastroesophageal reflux disease)    Headache    weekly (09/24/2014)   Heart murmur    Hypertension    [facility] spotted fever 11/2013   SVT (supraventricular tachycardia) several times in the last year (09/24/2014)   Transposition of great vessel    s/p Mustard procedure in 1980    Past Surgical History:  Procedure Laterality Date   FINGER ARTHROPLASTY Right    volar plate arthroplasty, right small finger proxima linterphalangeal joint [Other]   FRACTURE SURGERY     SEPTOSTOMY     Rashkind balloon atrial septostomy    TRANSPOSITION OF GREAT VESSELS REPAIR  1980   mustard procedure     reports that he has never smoked. His smokeless tobacco use includes snuff. He reports current alcohol use of about 60.0 standard drinks of alcohol per week. He reports that he does not use drugs.  Allergies  Allergen Reactions   Aspirin Other (See Comments)    Cardiologist told pt not to take    Family History  Problem Relation Age of Onset   Rheumatic fever Father  Hypertension Father     Family history reviewed and not pertinent.  Prior to Admission medications   Medication Sig Start Date End Date Taking? Authorizing Provider  acetaminophen  (TYLENOL ) 500 MG tablet Take 1,000 mg by mouth every 6 (six) hours as needed for headache (pain).   Yes [provider]  amiodarone  (PACERONE ) 200 MG tablet Take 1 tablet (200 mg total) by mouth 2 (two) times daily. 04/14/24  Yes Suzette Pac, MD  apixaban  (ELIQUIS )  5 MG TABS tablet Take 1 tablet (5 mg total) by mouth 2 (two) times daily. 12/18/18  Yes Okey Vina GAILS, MD  furosemide  (LASIX ) 20 MG tablet Take 1 tablet (20 mg total) by mouth 2 (two) times daily for 5 days. Patient taking differently: Take 20 mg by mouth daily. 08/05/23 02/23/25 Yes Kommor, Madison, MD  losartan  (COZAAR ) 25 MG tablet Take 25 mg by mouth daily. 03/10/20  Yes [provider]  metoprolol  succinate (TOPROL -XL) 100 MG 24 hr tablet Take 100 mg by mouth daily. 03/19/20  Yes [provider]  Multiple Vitamin (MULTIVITAMIN WITH MINERALS) TABS tablet Take 1 tablet by mouth daily.   Yes [provider]  hydrOXYzine  (VISTARIL ) 25 MG capsule TAKE 1 CAPSULE BY MOUTH EVERY 8 HOURS AS NEEDED FOR ANXIETY Patient not taking: Reported on 04/19/2024 04/26/22   Cook, Jayce G, DO    Physical Exam: Vitals:   04/19/24 1214 04/19/24 1215 04/19/24 1230 04/19/24 1256  BP: 125/86 125/86 122/78 (!) 148/93  Pulse: 72 71 68 78  Resp:      Temp:      TempSrc:      SpO2: 93% 93% 93% 98%  Weight:   129.3 kg   Height:   6' 2.5 (1.892 m)     Constitutional: Appears calm, comfortable, not in any acute distress Vitals:   04/19/24 1214 04/19/24 1215 04/19/24 1230 04/19/24 1256  BP: 125/86 125/86 122/78 (!) 148/93  Pulse: 72 71 68 78  Resp:      Temp:      TempSrc:      SpO2: 93% 93% 93% 98%  Weight:   129.3 kg   Height:   6' 2.5 (1.892 m)    Eyes: PERRL, lids and conjunctivae normal ENMT: Mucous membranes are moist. Posterior pharynx clear of any exudate or lesions. Neck: normal, supple, no masses, no thyromegaly Respiratory: CTA bilaterally, no wheezing, no crackles. Normal respiratory effort. No accessory muscle use.  Cardiovascular: S1-S2 heard, irregular rhythm , No murmurs / rubs / gallops. No extremity edema. 2+ pedal pulses. No carotid bruits.  Abdomen:Soft,  no tenderness, no masses palpated. No hepatosplenomegaly. Bowel sounds positive.  Musculoskeletal: no clubbing  / cyanosis. No joint deformity upper and lower extremities. Good ROM, no contractures. Normal muscle tone.  Skin: no rashes, lesions, ulcers. No induration Neurologic: CN 2-12 grossly intact. Sensation intact, DTR normal. Strength 5/5 in all 4.  Psychiatric: Normal judgment and insight. Alert and oriented x 3. Normal mood.    Labs on Admission: I have personally reviewed following labs and imaging studies  CBC: Recent Labs  Lab 04/14/24 1107 04/19/24 0654  WBC 10.6* 15.2*  NEUTROABS 6.8 11.0*  HGB 18.1* 14.8  HCT 53.4* 44.1  MCV 97.6 100.2*  PLT 270 252   Basic Metabolic Panel: Recent Labs  Lab 04/14/24 1107 04/19/24 0654  NA 138 138  K 4.7 3.0*  CL 99 99  CO2 23 21*  GLUCOSE 147* 172*  BUN <5* 9  CREATININE 0.85 0.88  CALCIUM  9.5 9.4   GFR: Estimated Creatinine Clearance: 151 mL/min (by C-G formula based on SCr of 0.88 mg/dL). Liver Function Tests: Recent Labs  Lab 04/14/24 1107 04/19/24 0654  AST 44* 17  ALT 45* 20  ALKPHOS 81 55  BILITOT 0.7 1.1  PROT 7.9 7.6  ALBUMIN 4.4 4.3   No results for input(s): LIPASE, AMYLASE in the last 168 hours. No results for input(s): AMMONIA in the last 168 hours. Coagulation Profile: No results for input(s): INR, PROTIME in the last 168 hours. Cardiac Enzymes: No results for input(s): CKTOTAL, CKMB, CKMBINDEX, TROPONINI in the last 168 hours. BNP (last 3 results) Recent Labs    04/19/24 0654  PROBNP 1,007.0*   HbA1C: No results for input(s): HGBA1C in the last 72 hours. CBG: No results for input(s): GLUCAP in the last 168 hours. Lipid Profile: No results for input(s): CHOL, HDL, LDLCALC, TRIG, CHOLHDL, LDLDIRECT in the last 72 hours. Thyroid Function Tests: Recent Labs    04/19/24 0655  TSH 2.210   Anemia Panel: No results for input(s): VITAMINB12, FOLATE, FERRITIN, TIBC, IRON, RETICCTPCT in the last 72 hours. Urine analysis:    Component Value Date/Time    COLORURINE YELLOW 11/11/2017 1735   APPEARANCEUR CLEAR 11/11/2017 1735   LABSPEC 1.026 11/11/2017 1735   PHURINE 6.0 11/11/2017 1735   GLUCOSEU NEGATIVE 11/11/2017 1735   HGBUR SMALL (A) 11/11/2017 1735   BILIRUBINUR NEGATIVE 11/11/2017 1735   KETONESUR 20 (A) 11/11/2017 1735   PROTEINUR NEGATIVE 11/11/2017 1735   UROBILINOGEN 1.0 07/31/2014 2043   NITRITE NEGATIVE 11/11/2017 1735   LEUKOCYTESUR NEGATIVE 11/11/2017 1735    Radiological Exams on Admission: DG Chest Port 1 View Result Date: 04/19/2024 CLINICAL DATA:  Shortness of breath onset last night. Started a new medication four days ago. EXAM: PORTABLE CHEST 1 VIEW COMPARISON:  Portable chest 01/01/2024. FINDINGS: 6:58 a.m. The heart is moderately enlarged. There is perihilar vascular engorgement. There is interstitial edema of the lung fields with a basal gradient and small pleural effusions. Findings consistent with CHF or fluid overload. There are bilateral infrahilar opacities of the lungs which could be due to alveolar edema or other airspace disease. There are median sternotomy sutures. The mediastinum is stable. There is thoracic spondylosis. IMPRESSION: 1. Cardiomegaly with perihilar vascular engorgement and interstitial edema of the lung fields with small pleural effusions. Findings consistent with CHF or fluid overload. 2. Bilateral infrahilar opacities which could be due to alveolar edema or other airspace disease. Electronically Signed   By: Francis Quam M.D.   On: 04/19/2024 07:10    EKG: Independently reviewed.  Normal sinus rhythm, no ST-T wave changes  Assessment/Plan Principal Problem:   Acute on chronic systolic CHF (congestive heart failure), NYHA class 3 (HCC) Active Problems:   Essential hypertension   Pulmonary hypertension (HCC)   Atrial fibrillation (HCC)   Dyslipidemia   Hx of complete transposition of great vessels   Anxiety and depression  Acute hypoxic respiratory failure Acute on chronic systolic  CHF: Patient presented with cough, progressive shortness of breath, hypoxia, orthopnea, elevated BNP. Chest x-ray shows findings consistent with cardiomegaly with interstitial edema. Patient was given Lasix  80 mg IV once, He felt better. Continue Lasix  IV 40 mg twice daily Monitor daily weight, intake output charting. Continue supplemental oxygen and wean as tolerated. Obtain 2D echocardiogram.  Atrial fibrillation: Heart rate well-controlled. Continue amiodarone , metoprolol  and Eliquis   Dyslipidemia; Obtain lipid profile, patient not on any statins.  Hypertension: Continue losartan , metoprolol  and Lasix .  Transposition of great vessels: Status post repair.  Hypokalemia: Replaced.  Continue to monitor  Leukocytosis: No signs of any infection continue to monitor  DVT prophylaxis: Eliquis  Code Status: Full code Family Communication: No family at bedside Disposition Plan:   Status is: Inpatient Remains inpatient appropriate because: HF exacerbation   Consults called: None Admission status: Inpatient   Darcel Dawley MD [facility]   If 7PM-7AM, please contact night-coverage   04/19/2024, 2:12 PM

## 2024-04-19 NOTE — ED Provider Notes (Signed)
 [place] EMERGENCY DEPARTMENT AT [facility] Provider Note   CSN: [id number] Arrival date & time: 04/19/24  9361     History Chief Complaint  Patient presents with   Shortness of Breath    HPI: Matthew [name] is a 48 y.o. male with history pertinent for transposition of the great vessels status post repair with Mustard procedure, HTN, HLD, pulmonary hypertension, atrial fibrillation, anxiety, depression, HFrEF (most recent EF 35% 2017) who presents complaining of shortness of breath. Patient arrived via POV.  History provided by patient.  No interpreter required during this encounter.  Patient reports that he recently had elevated heart rate and came to the emergency department last week.  Reports that he was cardioverted and started on amiodarone .  Reports that he has been adherent to amiodarone  as well as all of his other home medications, and is on day 4 of these medications.  Reports that his heart rate has been well, however beginning 3 days ago he began feeling poorly, 3 days ago he had a subjective fever, as well as a dry cough.  He noted last night that the cough worsened when he was in a supine position and also accompanied by a sensation of dry throat.  Reports that he slept poorly and up with more pillows than baseline because of difficulty lying flat.  Reports that he also felt more short of breath when lying flat, however feels this may have been due to anxiety.  Denies any purulent sputum, sore throat, congestion, rhinorrhea, ear pain, nausea, vomiting, diarrhea, chest pain.  Reports that he has no known sick contacts, reports that he has been adherent to all his home medications.  Given he slept poorly due to his orthopnea he decided to come to the emergency department.  Patient's recorded medical, surgical, social, medication list and allergies were reviewed in the Snapshot window as part of the initial history.   Prior to Admission medications   Medication Sig  Start Date End Date Taking? Authorizing Provider  acetaminophen  (TYLENOL ) 500 MG tablet Take 1,000 mg by mouth every 6 (six) hours as needed for headache (pain).   Yes [provider]  amiodarone  (PACERONE ) 200 MG tablet Take 1 tablet (200 mg total) by mouth 2 (two) times daily. 04/14/24  Yes Suzette Pac, MD  apixaban  (ELIQUIS ) 5 MG TABS tablet Take 1 tablet (5 mg total) by mouth 2 (two) times daily. 12/18/18  Yes Okey Vina GAILS, MD  furosemide  (LASIX ) 20 MG tablet Take 1 tablet (20 mg total) by mouth 2 (two) times daily for 5 days. Patient taking differently: Take 20 mg by mouth daily. 08/05/23 02/23/25 Yes Kommor, Madison, MD  losartan  (COZAAR ) 25 MG tablet Take 25 mg by mouth daily. 03/10/20  Yes [provider]  metoprolol  succinate (TOPROL -XL) 100 MG 24 hr tablet Take 100 mg by mouth daily. 03/19/20  Yes [provider]  Multiple Vitamin (MULTIVITAMIN WITH MINERALS) TABS tablet Take 1 tablet by mouth daily.   Yes [provider]  hydrOXYzine  (VISTARIL ) 25 MG capsule TAKE 1 CAPSULE BY MOUTH EVERY 8 HOURS AS NEEDED FOR ANXIETY Patient not taking: Reported on 04/19/2024 04/26/22   Cook, Jayce G, DO     Allergies: Aspirin   Review of Systems   ROS as per HPI  Physical Exam Updated Vital Signs BP 119/79   Pulse (!) 58   Temp 97.9 F (36.6 C) (Oral)   Resp (!) 28   Ht 6' 2 (1.88 m)   Wt  133.8 kg   SpO2 93%   BMI 37.87 kg/m  Physical Exam Vitals and nursing note reviewed.  Constitutional:      General: He is not in acute distress.    Appearance: He is well-developed.  HENT:     Head: Normocephalic and atraumatic.  Eyes:     Conjunctiva/sclera: Conjunctivae normal.  Cardiovascular:     Rate and Rhythm: Normal rate and regular rhythm.     Heart sounds: Murmur (Systolic) heard.  Pulmonary:     Effort: Pulmonary effort is normal. No respiratory distress.     Breath sounds: Normal breath sounds.     Comments: Saturating in the low 90s on 2 L nasal  cannula Abdominal:     Palpations: Abdomen is soft.     Tenderness: There is no abdominal tenderness.  Musculoskeletal:        General: No swelling.     Cervical back: Neck supple.     Right lower leg: No tenderness. No edema.     Left lower leg: No tenderness. No edema.  Skin:    General: Skin is warm and dry.     Capillary Refill: Capillary refill takes less than 2 seconds.  Neurological:     Mental Status: He is alert.  Psychiatric:        Mood and Affect: Mood normal.     ED Course/ Medical Decision Making/ A&P  Procedures Procedures   Medications Ordered in ED Medications  furosemide  (LASIX ) injection 80 mg (80 mg Intravenous Given 04/19/24 1056)    Medical Decision Making:   Matthew [name] is a 48 y.o. male who presents for shortness of breath and cough as per above.  Physical exam is pertinent for murmur, tubular oxygen requirement.   The differential includes but is not limited to heart failure exacerbation, ACS, amiodarone  side effect, viral illness, allergic reaction, pneumonia, bronchitis  Independent historian: None  External data reviewed: Notes: Reviewed patient's ED note from 10/1, patient was seen for palpitations, found to be in SVT PE, was cardioverted with 200 J, and was started on amiodarone  after consult with cardiology  Labs: Ordered, Independent interpretation, and Details: CBC with leukocytosis to 15.2 with left shift.  No anemia or thrombocytopenia.  CMP with mild hypokalemia to 3.0.  No AKI, no emergent LFT abnormality.  Mild anion gap elevation to 19.  COVID/flu/RSV negative.  TSH WNL.  BNP elevated at greater than 1000. Initial troponin undetectable, delta undetectable.  Radiology: Ordered, Independent interpretation, Details: Chest x-ray with cardiomegaly, increased interstitial markings bilaterally concerning for volume overload.  Cardiomegaly is stable in comparison to most recent prior chest x-ray, increased interstitial markings is  worsened on comparison to prior.  No focal airspace opacification, mediastinal silhouette derangement, pneumothorax, bony derangement, and All images reviewed independently.  Agree with radiology report at this time.   DG Chest Port 1 View Result Date: 04/19/2024 CLINICAL DATA:  Shortness of breath onset last night. Started a new medication four days ago. EXAM: PORTABLE CHEST 1 VIEW COMPARISON:  Portable chest 01/01/2024. FINDINGS: 6:58 a.m. The heart is moderately enlarged. There is perihilar vascular engorgement. There is interstitial edema of the lung fields with a basal gradient and small pleural effusions. Findings consistent with CHF or fluid overload. There are bilateral infrahilar opacities of the lungs which could be due to alveolar edema or other airspace disease. There are median sternotomy sutures. The mediastinum is stable. There is thoracic spondylosis. IMPRESSION: 1. Cardiomegaly with perihilar vascular engorgement and interstitial edema  of the lung fields with small pleural effusions. Findings consistent with CHF or fluid overload. 2. Bilateral infrahilar opacities which could be due to alveolar edema or other airspace disease. Electronically Signed   By: Francis Quam M.D.   On: 04/19/2024 07:10    EKG/Medicine tests: Ordered and Independent interpretation EKG Interpretation: Sinus rhythm  Short PR interval  Left axis deviation  Right ventricular hypertrophy  Abnrm T, anterolateral lds, similar to prior  No significant change since last tracing Confirmed by Rogelia Satterfield ([id number]) on 04/19/2024 7:17:25 AM  Interventions: Lasix    See the EMR for full details regarding lab and imaging results.  Patient presents for shortness of breath, orthopnea, cough on supine positioning in the setting of recent addition of amiodarone  to antiarrhythmic regimen.  Patient initially saturating in the high 80s on room air, patient reports that he is baseline in the low 90s on room air, was started on 2 L  nasal cannula with improvement.  Patient otherwise in normal sinus rhythm, no lower extremity edema, vitally stable.  Labs and imaging are indicated.  Chest x-ray as well as labs indicate volume overload with elevated BNP and increased interstitial markings, in setting of patient's history of heart failure as well as new oxygen requirement, do feel that patient has significant volume overload.  Lasix  administered.  Patient's presentation is most consistent with heart failure exacerbation with volume overload.  Given patient has associated acute hypoxic respiratory failure, do feel that patient requires admission for IV diuresis.  Hospitalist consulted for admission.  Patient accepted by Dr. Leotis, no additional acute events while under my care.  Presentation is most consistent with acute complicated illness and Current presentation is complicated by underlying chronic conditions  Discussion of management or test interpretations with external provider(s): Dr. Leotis, hospitalist  Risk Drugs:Prescription drug management Treatment: Decision regarding hospitalization  Disposition: ADMIT: I believe the patient requires admission for further care and management. The patient was admitted to hospitalists. Please see inpatient provider note for additional treatment plan details.   MDM generated using voice dictation software and may contain dictation errors.  Please contact me for any clarification or with any questions.  Clinical Impression:  1. Acute respiratory failure with hypoxia (HCC)   2. Acute on chronic congestive heart failure, unspecified heart failure type (HCC)      Admit   Final Clinical Impression(s) / ED Diagnoses Final diagnoses:  Acute respiratory failure with hypoxia (HCC)  Acute on chronic congestive heart failure, unspecified heart failure type [facility])    Rx / DC Orders ED Discharge Orders     None        Rogelia Satterfield RAMAN, MD 04/19/24 1636

## 2024-04-19 NOTE — ED Triage Notes (Signed)
 Pt c/o sob that started last night; pt states he started a new medication Thursday

## 2024-04-20 ENCOUNTER — Inpatient Hospital Stay (HOSPITAL_COMMUNITY)

## 2024-04-20 DIAGNOSIS — I5023 Acute on chronic systolic (congestive) heart failure: Secondary | ICD-10-CM | POA: Diagnosis not present

## 2024-04-20 LAB — COMPREHENSIVE METABOLIC PANEL WITH GFR
ALT: 15 U/L (ref 0–44)
AST: 13 U/L — ABNORMAL LOW (ref 15–41)
Albumin: 3.9 g/dL (ref 3.5–5.0)
Alkaline Phosphatase: 48 U/L (ref 38–126)
Anion gap: 15 (ref 5–15)
BUN: 11 mg/dL (ref 6–20)
CO2: 22 mmol/L (ref 22–32)
Calcium: 9 mg/dL (ref 8.9–10.3)
Chloride: 102 mmol/L (ref 98–111)
Creatinine, Ser: 0.75 mg/dL (ref 0.61–1.24)
GFR, Estimated: >60 mL/min (ref >60)
Glucose, Bld: 96 mg/dL (ref 70–99)
Potassium: 3.5 mmol/L (ref 3.5–5.1)
Sodium: 139 mmol/L (ref 135–145)
Total Bilirubin: 0.7 mg/dL (ref 0.0–1.2)
Total Protein: 7.1 g/dL (ref 6.5–8.1)

## 2024-04-20 LAB — CBC
HCT: 43.5 % (ref 39.0–52.0)
Hemoglobin: 14.5 g/dL (ref 13.0–17.0)
MCH: 33 pg (ref 26.0–34.0)
MCHC: 33.3 g/dL (ref 30.0–36.0)
MCV: 99.1 fL (ref 80.0–100.0)
Platelets: 231 K/uL (ref 150–400)
RBC: 4.39 MIL/uL (ref 4.22–5.81)
RDW: 13.3 % (ref 11.5–15.5)
WBC: 8.1 K/uL (ref 4.0–10.5)
nRBC: 0 % (ref 0.0–0.2)

## 2024-04-20 LAB — ECHOCARDIOGRAM COMPLETE
Calc EF: 55.9 %
Height: 74.5 in
S' Lateral: 3.8 cm
Single Plane A2C EF: 49.4 %
Single Plane A4C EF: 62.9 %
Weight: 4518.55 [oz_av]

## 2024-04-20 LAB — MAGNESIUM: Magnesium: 2.2 mg/dL (ref 1.7–2.4)

## 2024-04-20 LAB — PHOSPHORUS: Phosphorus: 4.4 mg/dL (ref 2.5–4.6)

## 2024-04-20 MED ORDER — HYDROXYZINE HCL 25 MG PO TABS
25.0000 mg | ORAL_TABLET | Freq: Three times a day (TID) | ORAL | Status: AC | PRN
  Administered 2024-04-20 (×2): 25 mg via ORAL
  Filled 2024-04-20 (×2): qty 1

## 2024-04-20 NOTE — Progress Notes (Signed)
 PROGRESS NOTE    Matthew [name]  FMW:[id number] DOB: Nov 19, 1975 DOA: 04/19/2024 PCP: Cook, Jayce G, DO   Brief Narrative:  This 48 y.o. male with PMH significant for transposition of great vessels, s/p repair with mustard procedure, hypertension, hyperlipidemia, pulmonary hypertension, A-fib on Eliquis , anxiety/depression, HFr EF(recent echo 35 to 40% in 2017) presented in the ED with complaints of shortness of breath for few days.  Patient was seen in the ED a week ago with SVT, he underwent cardioversion and was discharged on amiodarone .  Patient has been adherent to the amiodarone  and other home medications.  Patient reports he has developed shortness of breath for last 3 days, unable to lie flat,  has been using 2 pillows to sleep.  He is found to have proBNP 1007, troponin 15 x 3, chest x-ray shows cardiomegaly with increase vascular engorgement with interstitial edema of lung fields.  Patient admitted for further evaluation.  Assessment & Plan:   Principal Problem:   Acute on chronic systolic CHF (congestive heart failure), NYHA class 3 (HCC) Active Problems:   Essential hypertension   Pulmonary hypertension (HCC)   Atrial fibrillation (HCC)   Dyslipidemia   Hx of complete transposition of great vessels   Anxiety and depression  Acute hypoxic respiratory failure: Acute on chronic systolic CHF: Patient presented with cough, progressive shortness of breath, hypoxia, orthopnea, elevated BNP. Chest x-ray shows findings consistent with cardiomegaly with interstitial edema. Patient was given Lasix  80 mg IV once, He felt better. Continue Lasix  IV 40 mg twice daily Monitor daily weight, intake output charting. Continue supplemental oxygen and wean as tolerated. Obtain 2D echocardiogram.  Intake/Output Summary (Last 24 hours) at 04/20/2024 1130 Last data filed at 04/20/2024 0736 Gross per 24 hour  Intake 120 ml  Output 3950 ml  Net -3830 ml      Atrial fibrillation: Heart rate  well-controlled. Continue amiodarone , metoprolol  and Eliquis .   Dyslipidemia; Obtain lipid profile, patient not on any statins.   Hypertension: Continue losartan , metoprolol  and Lasix .   Transposition of great vessels: Status post repair.   Hypokalemia: Replaced.  Continue to monitor   Leukocytosis: No signs of any infection,  continue to monitor   DVT prophylaxis: Eliquis  Code Status: Full code Family Communication: No family at bedside. Disposition Plan:    Status is: Inpatient Remains inpatient appropriate because: Admitted for CHF exacerbation.  Consultants:  None  Procedures: None Antimicrobials: None  Subjective: Patient was seen and examined at bedside.  Overnight events noted. Patient was lying on the bed comfortably and states he still feels some shortness of breath.  Objective: Vitals:   04/20/24 0557 04/20/24 0603 04/20/24 0732 04/20/24 0817  BP: 113/73  109/89 125/81  Pulse: (!) 59  65   Resp: 20  20 20   Temp: 98 F (36.7 C)  98.2 F (36.8 C)   TempSrc: Oral  Oral   SpO2: 93%  92% 97%  Weight:  128.1 kg    Height:        Intake/Output Summary (Last 24 hours) at 04/20/2024 1130 Last data filed at 04/20/2024 0736 Gross per 24 hour  Intake 120 ml  Output 3950 ml  Net -3830 ml   Filed Weights   04/19/24 0645 04/19/24 1230 04/20/24 0603  Weight: 133.8 kg 129.3 kg 128.1 kg   Examination:  General exam: Appears calm and comfortable, not in any acute distress. Respiratory system: Decreased breath sounds. Respiratory effort normal. Cardiovascular system: S1 & S2 heard, RRR. No JVD, murmurs, rubs,  gallops or clicks. No pedal edema. Gastrointestinal system: Abdomen is non distended, soft and nontender. Normal bowel sounds heard. Central nervous system: Alert and oriented x 3. No focal neurological deficits. Extremities: No edema, no cyanosis, no clubbing. Skin: No rashes, lesions or ulcers Psychiatry: Judgement and insight appear normal. Mood &  affect appropriate.    Data Reviewed: I have personally reviewed following labs and imaging studies  CBC: Recent Labs  Lab 04/14/24 1107 04/19/24 0654 04/20/24 0512  WBC 10.6* 15.2* 8.1  NEUTROABS 6.8 11.0*  --   HGB 18.1* 14.8 14.5  HCT 53.4* 44.1 43.5  MCV 97.6 100.2* 99.1  PLT 270 252 231   Basic Metabolic Panel: Recent Labs  Lab 04/14/24 1107 04/19/24 0654 04/20/24 0512  NA 138 138 139  K 4.7 3.0* 3.5  CL 99 99 102  CO2 23 21* 22  GLUCOSE 147* 172* 96  BUN <5* 9 11  CREATININE 0.85 0.88 0.75  CALCIUM  9.5 9.4 9.0  MG  --   --  2.2  PHOS  --   --  4.4   GFR: Estimated Creatinine Clearance: 165.3 mL/min (by C-G formula based on SCr of 0.75 mg/dL). Liver Function Tests: Recent Labs  Lab 04/14/24 1107 04/19/24 0654 04/20/24 0512  AST 44* 17 13*  ALT 45* 20 15  ALKPHOS 81 55 48  BILITOT 0.7 1.1 0.7  PROT 7.9 7.6 7.1  ALBUMIN 4.4 4.3 3.9   No results for input(s): LIPASE, AMYLASE in the last 168 hours. No results for input(s): AMMONIA in the last 168 hours. Coagulation Profile: No results for input(s): INR, PROTIME in the last 168 hours. Cardiac Enzymes: No results for input(s): CKTOTAL, CKMB, CKMBINDEX, TROPONINI in the last 168 hours. BNP (last 3 results) Recent Labs    04/19/24 0654  PROBNP 1,007.0*   HbA1C: No results for input(s): HGBA1C in the last 72 hours. CBG: No results for input(s): GLUCAP in the last 168 hours. Lipid Profile: No results for input(s): CHOL, HDL, LDLCALC, TRIG, CHOLHDL, LDLDIRECT in the last 72 hours. Thyroid Function Tests: Recent Labs    04/19/24 0655  TSH 2.210   Anemia Panel: No results for input(s): VITAMINB12, FOLATE, FERRITIN, TIBC, IRON, RETICCTPCT in the last 72 hours. Sepsis Labs: No results for input(s): PROCALCITON, LATICACIDVEN in the last 168 hours.  Recent Results (from the past 240 hours)  Resp panel by RT-PCR (RSV, Flu A&B, Covid) Anterior Nasal  Swab     Status: None   Collection Time: 04/19/24  8:04 AM   Specimen: Anterior Nasal Swab  Result Value Ref Range Status   SARS Coronavirus 2 by RT PCR NEGATIVE NEGATIVE Final    Comment: (NOTE) SARS-CoV-2 target nucleic acids are NOT DETECTED.  The SARS-CoV-2 RNA is generally detectable in upper respiratory specimens during the acute phase of infection. The lowest concentration of SARS-CoV-2 viral copies this assay can detect is 138 copies/mL. A negative result does not preclude SARS-Cov-2 infection and should not be used as the sole basis for treatment or other patient management decisions. A negative result may occur with  improper specimen collection/handling, submission of specimen other than nasopharyngeal swab, presence of viral mutation(s) within the areas targeted by this assay, and inadequate number of viral copies(<138 copies/mL). A negative result must be combined with clinical observations, patient history, and epidemiological information. The expected result is Negative.  Fact Sheet for Patients:  BloggerCourse.com  Fact Sheet for Healthcare Providers:  SeriousBroker.it  This test is no t yet approved or  cleared by the [place]  FDA and  has been authorized for detection and/or diagnosis of SARS-CoV-2 by FDA under an Emergency Use Authorization (EUA). This EUA will remain  in effect (meaning this test can be used) for the duration of the COVID-19 declaration under Section 564(b)(1) of the Act, 21 U.S.C.section 360bbb-3(b)(1), unless the authorization is terminated  or revoked sooner.       Influenza A by PCR NEGATIVE NEGATIVE Final   Influenza B by PCR NEGATIVE NEGATIVE Final    Comment: (NOTE) The Xpert Xpress SARS-CoV-2/FLU/RSV plus assay is intended as an aid in the diagnosis of influenza from Nasopharyngeal swab specimens and should not be used as a sole basis for treatment. Nasal washings and aspirates  are unacceptable for Xpert Xpress SARS-CoV-2/FLU/RSV testing.  Fact Sheet for Patients: BloggerCourse.com  Fact Sheet for Healthcare Providers: SeriousBroker.it  This test is not yet approved or cleared by the [place]  FDA and has been authorized for detection and/or diagnosis of SARS-CoV-2 by FDA under an Emergency Use Authorization (EUA). This EUA will remain in effect (meaning this test can be used) for the duration of the COVID-19 declaration under Section 564(b)(1) of the Act, 21 U.S.C. section 360bbb-3(b)(1), unless the authorization is terminated or revoked.     Resp Syncytial Virus by PCR NEGATIVE NEGATIVE Final    Comment: (NOTE) Fact Sheet for Patients: BloggerCourse.com  Fact Sheet for Healthcare Providers: SeriousBroker.it  This test is not yet approved or cleared by the [place]  FDA and has been authorized for detection and/or diagnosis of SARS-CoV-2 by FDA under an Emergency Use Authorization (EUA). This EUA will remain in effect (meaning this test can be used) for the duration of the COVID-19 declaration under Section 564(b)(1) of the Act, 21 U.S.C. section 360bbb-3(b)(1), unless the authorization is terminated or revoked.  Performed at [facility], [address], [place], [place] [address]     Radiology Studies: [facility] 1 View Result Date: 04/19/2024 CLINICAL DATA:  Shortness of breath onset last night. Started a new medication four days ago. EXAM: PORTABLE CHEST 1 VIEW COMPARISON:  Portable chest 01/01/2024. FINDINGS: 6:58 a.m. The heart is moderately enlarged. There is perihilar vascular engorgement. There is interstitial edema of the lung fields with a basal gradient and small pleural effusions. Findings consistent with CHF or fluid overload. There are bilateral infrahilar opacities of the lungs which could be due to alveolar edema or other  airspace disease. There are median sternotomy sutures. The mediastinum is stable. There is thoracic spondylosis. IMPRESSION: 1. Cardiomegaly with perihilar vascular engorgement and interstitial edema of the lung fields with small pleural effusions. Findings consistent with CHF or fluid overload. 2. Bilateral infrahilar opacities which could be due to alveolar edema or other airspace disease. Electronically Signed   By: Francis Quam M.D.   On: 04/19/2024 07:10   Scheduled Meds:  amiodarone   200 mg Oral BID   apixaban   5 mg Oral BID   docusate sodium  100 mg Oral BID   furosemide   40 mg Intravenous Q12H   losartan   25 mg Oral Daily   metoprolol  succinate  100 mg Oral Daily   Continuous Infusions:   LOS: 1 day    Time spent: 50 mins  Darcel Dawley, MD [facility]   If 7PM-7AM, please contact night-coverage

## 2024-04-20 NOTE — Plan of Care (Signed)
  Problem: Education: Goal: Knowledge of General Education information will improve Description Including pain rating scale, medication(s)/side effects and non-pharmacologic comfort measures Outcome: Progressing   Problem: Health Behavior/Discharge Planning: Goal: Ability to manage health-related needs will improve Outcome: Progressing   Problem: Activity: Goal: Capacity to carry out activities will improve Outcome: Progressing   

## 2024-04-20 NOTE — Progress Notes (Signed)
 Inpatient Care Manager [facility]) has reviewed patient and no ICM needs have been identified at this time. We will continue to monitor patient advancement through interdisciplinary progression rounds. If new patient transition needs arise, please place a ICM consult.     04/20/24 1524  TOC Brief Assessment  Insurance and Status Reviewed  Patient has primary care physician Yes  Home environment has been reviewed Home  Prior level of function: Independent  Prior/Current Home Services No current home services  Social Drivers of Health Review SDOH reviewed no interventions necessary  Readmission risk has been reviewed Yes  Transition of care needs no transition of care needs at this time

## 2024-04-20 NOTE — Progress Notes (Signed)
  Echocardiogram 2D Echocardiogram has been performed.  Matthew [name] 04/20/2024, 1:41 PM

## 2024-04-21 DIAGNOSIS — I471 Supraventricular tachycardia, unspecified: Secondary | ICD-10-CM

## 2024-04-21 DIAGNOSIS — I10 Essential (primary) hypertension: Secondary | ICD-10-CM

## 2024-04-21 DIAGNOSIS — Z8774 Personal history of (corrected) congenital malformations of heart and circulatory system: Secondary | ICD-10-CM

## 2024-04-21 DIAGNOSIS — I5023 Acute on chronic systolic (congestive) heart failure: Secondary | ICD-10-CM | POA: Diagnosis not present

## 2024-04-21 DIAGNOSIS — E785 Hyperlipidemia, unspecified: Secondary | ICD-10-CM

## 2024-04-21 MED ORDER — HYDROXYZINE HCL 25 MG PO TABS
25.0000 mg | ORAL_TABLET | Freq: Three times a day (TID) | ORAL | Status: DC | PRN
  Administered 2024-04-21: 25 mg via ORAL
  Filled 2024-04-21: qty 1

## 2024-04-21 MED ORDER — EMPAGLIFLOZIN 10 MG PO TABS
10.0000 mg | ORAL_TABLET | Freq: Every day | ORAL | Status: DC
  Administered 2024-04-21 – 2024-04-22 (×2): 10 mg via ORAL
  Filled 2024-04-21 (×2): qty 1

## 2024-04-21 NOTE — Consult Note (Addendum)
 Cardiology Consultation   Patient ID: Matthew [name] MRN: [id number]; DOB: 1976/05/14  Admit date: 04/19/2024 Date of Consult: 04/21/2024  PCP:  Cook, Jayce G, DO   [facility] Providers Cardiologist:  Vina Gull, MD    Patient Profile: Matthew [name] is a 48 y.o. 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 Bryn.  History of Present Illness: Matthew [name] was last seen by [facility] cardiology on 08/11/2023.  Reported significant improvement in edema and SOB with Lasix .  Reported episodes of palpitations with history of inconsistent heart rhythm.  Ordered 2-week heart monitor but results not located.  Ordered cardiac MRI in 11/2023 but unable to complete due to did not tolerate being in the scanner.  Patient has been seen in ED on multiple occasions for SVT including 01/01/2024, 02/24/2024, 04/14/2024 s/p DCCV on all occurrences. Last ED visit was discharged on amiodarone  200 mg BID.  Presented to [facility] ED 04/19/2024 for SOB. K 3 now 3.5, CR 0.88 now 0.75, Mg 2.2, pro BNP 1007, Tn <15 x2, CBC WNL except WBC 15.2 >8.1 and neutrophil 11/monocytes 1.7, TSH WNL, negative respiratory viral panel EKG: NSR, HR 86, NSR, HR 86, nonspecific T wave abnormalities ( no ischemic changes)  CXR consistent with CHF or fluid overload. 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  Treated with Lasix  IV 80 mg X1 then 40 mg X4. I/O -5044 ml, 294 > 282 lbs.   On interview, patient reports after DCCV on Wednesday day he developed sick with fever/dry cough cough on Friday and Saturday.  During that time, he reports drinking 16-17 bottles of water to stay hydrated.  Reported on Sunday while laying down sleep he woke up with SOB and LE edema.  Denies any SOB associated with exertion.  Reports daily medication compliance including Lasix  20 mg daily.  Also reports that he eats what he wants including high sodium diet.  Denies any palpitations, chest pains, dizziness, syncope.  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  Denies any cigarette/EtOH/drug use but does admit to tobacco dipping.  Patient is not usually on O2 at home but is currently on 2 L nasal cannula.  Reports significant improvement in SOB and LEE with IV Lasix .  Patient has been able to walk around room and to bathroom without any concerns. Patient plans to continue to follow with both [facility] and [facility] for cardiology.   Past Medical History:  Diagnosis Date   A-fib [facility])    Alcohol abuse    Anxiety    Atypical atrial flutter (HCC)    Complete transposition of  great vessels    Depression    Dyslipidemia    GERD (gastroesophageal reflux disease)    Headache    weekly (09/24/2014)   Heart murmur    Hypertension    [facility] spotted fever 11/2013   SVT (supraventricular tachycardia) several times in the last year (09/24/2014)   Transposition of great vessel    s/p Mustard procedure in 1980     Past Surgical History:  Procedure Laterality Date   FINGER ARTHROPLASTY Right    volar plate arthroplasty, right small finger proxima linterphalangeal joint [Other]   FRACTURE SURGERY     SEPTOSTOMY     Rashkind balloon atrial septostomy    TRANSPOSITION OF GREAT VESSELS REPAIR  1980   mustard procedure     Home Medications:  Prior to Admission medications   Medication Sig Start Date End Date Taking? Authorizing Provider  acetaminophen  (TYLENOL ) 500 MG tablet Take 1,000 mg by mouth every 6 (six) hours as needed for headache (pain).   Yes [provider]  amiodarone  (PACERONE ) 200 MG tablet Take 1 tablet (200 mg total) by mouth 2 (two) times daily. 04/14/24  Yes Suzette Pac, MD  apixaban  (ELIQUIS ) 5 MG TABS tablet Take 1 tablet (5 mg total) by mouth 2 (two) times daily. 12/18/18  Yes Okey Vina GAILS, MD  furosemide  (LASIX ) 20 MG tablet Take 1 tablet (20 mg total) by mouth 2 (two) times daily for 5 days. Patient taking differently: Take 20 mg by mouth daily. 08/05/23 02/23/25 Yes Kommor, Madison, MD  hydrOXYzine  (VISTARIL ) 25 MG capsule TAKE 1 CAPSULE BY MOUTH EVERY 8 HOURS AS NEEDED FOR ANXIETY 04/26/22  Yes Cook, Jayce G, DO  losartan  (COZAAR ) 25 MG tablet Take 25 mg by mouth daily. 03/10/20  Yes [provider]  metoprolol  succinate (TOPROL -XL) 100 MG 24 hr tablet Take 100 mg by mouth daily. 03/19/20  Yes [provider]  Multiple Vitamin (MULTIVITAMIN WITH MINERALS) TABS tablet Take 1 tablet by mouth daily.   Yes [provider]    Scheduled Meds:  amiodarone   200 mg Oral BID   apixaban   5 mg Oral BID   docusate sodium  100 mg Oral BID   furosemide   40 mg Intravenous Q12H   losartan   25 mg Oral Daily   metoprolol  succinate  100 mg Oral Daily   Continuous Infusions:  PRN Meds: acetaminophen  **OR** acetaminophen , guaiFENesin-dextromethorphan, ondansetron  **OR** ondansetron  (ZOFRAN ) IV  Allergies:    Allergies  Allergen Reactions   Aspirin  Other (See Comments)    Cardiologist told pt not to take    Social History:   Social History   Socioeconomic History   Marital status: Single    Spouse name: Not on file   Number of children: Not on file   Years of education: Not on file   Highest education level: Not on file  Occupational History   Not on file  Tobacco Use   Smoking status: Never   Smokeless tobacco: Current    Types: Snuff   Tobacco comments:    dipped when I played softball; none since 2011  Vaping Use   Vaping status: Every Day  Substance and Sexual Activity   Alcohol use: Yes    Alcohol/week: 60.0 standard drinks of alcohol    Types: 60 Shots of liquor per week    Comment: occassionally a few   Drug use: No   Sexual activity: Yes  Other Topics Concern   Not on file  Social History Narrative  Not on file   Family History:   Family History  Problem Relation Age of Onset   Rheumatic fever Father    Hypertension Father      ROS:  Please see the history of present illness.  All other ROS reviewed and negative.     Physical Exam/Data: Vitals:   04/20/24 2021 04/21/24 0500 04/21/24 0614 04/21/24 1100  BP: 119/75  106/68   Pulse: 72  65   Resp: (!) 22  (!) 22   Temp: 98.5 F (36.9 C)  98.3 F (36.8 C)   TempSrc: Oral  Oral   SpO2: 94%  95%   Weight:  128 kg  127.4 kg  Height:        Intake/Output Summary (Last 24 hours) at 04/21/2024 1221 Last data filed at 04/21/2024 1100 Gross per 24 hour  Intake --  Output 2050 ml  Net -2050 ml      04/21/2024   11:00 AM 04/21/2024    5:00 AM 04/20/2024    6:03 AM  Last 3 Weights  Weight (lbs) 280 lb 13.9 oz 282 lb 3 oz 282 lb 6.6 oz  Weight (kg) 127.4 kg 128 kg 128.1 kg     Body mass index is 35.58 kg/m.  General:  Well nourished, well developed, in no acute distress; 2 L [place]  HEENT: normal Neck: no JVD Vascular: No carotid bruits; Distal pulses 2+ bilaterally Cardiac:  normal S1, S2; RRR; no murmur Lungs:  diminished breath sounds in  lung bases Abd: soft, nontender, no hepatomegaly  Ext: trace edema Musculoskeletal:  No deformities, BUE and BLE strength normal and equal Skin: warm and dry  Neuro:  CNs 2-12 intact, no focal abnormalities noted Psych:  Normal affect   EKG:  The EKG was personally reviewed and demonstrates:   NSR, HR 86, NSR, HR 86, nonspecific T wave abnormalities ( no ischemic changes)  Telemetry:  Telemetry was personally reviewed and demonstrates:  NSR, Hr 60-70's  Relevant CV Studies:  Cardiac MRI 12/2020 @ [facility] FINAL IMPRESSION   ==========================================================================================================  - Complete transposition of the great arteries s/p Mustard operation (atrial switch)  - Mild to moderate tricuspid valve regurgitation  - Mild narrowing of the superior systemic venous limb baffle without any significant obstruction  - Mild central aortic valve regurgitation  - Tortuous aortic arch, most consistent with pseudocoarctation  - Moderate to severe right ventricular hypertrophy with non-specific hyperenhancement  - Mildly dilated right ventricle with normal biventricular systolic function   Compared to the prior scan, there has been a mild increase in right ventricular volumes, but no other  significant change.    ECHO 04/20/2024 IMPRESSIONS   1. 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   2. Overall does not appear to be signficantly changed from echo done at  [facility] in Dec 2024.   3. Poor acoustic windows limit study.   4. Anatomic LV (pulmonic ventricle) is small. There is septal flattening  Overall systolic function normal (55%). Left ventricular diastolic  parameters are indeterminate.   5. RV (systemic ventricle) is moderately dilated and ventricular function  is moderately reduced RVEF approximately 40%. Severely increased right  ventricular wall thickness.   6. S/p Mustard Unable see flow through baffles.   7. Small  to moderate.   8. The mitral valve is normal in structure. Trivial mitral valve  regurgitation.   9. Tricuspid valve regurgitation is moderate.  10. 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 Trileaflet  Mild  AI. The aortic valve is tricuspid. Aortic valve regurgitation is mild.  11. Pulmonic valve arises from anatomic LV, posterior to AV Difficult to  see leaflets well Trace PI.   Laboratory Data: High Sensitivity Troponin:  No results for input(s): TROPONINIHS in the last 720 hours.   Chemistry Recent Labs  Lab 04/19/24 0654 04/20/24 0512  NA 138 139  K 3.0* 3.5  CL 99 102  CO2 21* 22  GLUCOSE 172* 96  BUN 9 11  CREATININE 0.88 0.75  CALCIUM  9.4 9.0  MG  --  2.2  GFRNONAA >60 >60  ANIONGAP 19* 15    Recent Labs  Lab 04/19/24 0654 04/20/24 0512  PROT 7.6 7.1  ALBUMIN 4.3 3.9  AST 17 13*  ALT 20 15  ALKPHOS 55 48  BILITOT 1.1 0.7   Lipids No results for input(s): CHOL, TRIG, HDL, LABVLDL, LDLCALC, CHOLHDL in the last 168 hours.  Hematology Recent Labs  Lab 04/19/24 0654 04/20/24 0512  WBC 15.2* 8.1  RBC 4.40 4.39  HGB 14.8 14.5  HCT 44.1 43.5  MCV 100.2* 99.1  MCH 33.6 33.0  MCHC 33.6 33.3  RDW 13.4 13.3  PLT 252 231   Thyroid  Recent Labs  Lab 04/19/24 0655  TSH 2.210    BNP Recent Labs  Lab 04/19/24 0654  PROBNP 1,007.0*    DDimer No results for input(s): DDIMER in the last 168 hours.  Radiology/Studies:   DG Chest Port 1 View Result Date: 04/19/2024 CLINICAL DATA:  Shortness of breath onset last night. Started a new medication four days ago. EXAM: PORTABLE CHEST 1 VIEW COMPARISON:  Portable chest 01/01/2024. FINDINGS: 6:58 a.m. The heart is moderately enlarged. There is perihilar vascular engorgement. There is interstitial edema of the lung fields with a basal gradient and small pleural effusions. Findings consistent with CHF or fluid overload. There are bilateral infrahilar opacities of the lungs which could be due  to alveolar edema or other airspace disease. There are median sternotomy sutures. The mediastinum is stable. There is thoracic spondylosis. IMPRESSION: 1. Cardiomegaly with perihilar vascular engorgement and interstitial edema of the lung fields with small pleural effusions. Findings consistent with CHF or fluid overload. 2. Bilateral infrahilar opacities which could be due to alveolar edema or other airspace disease. Electronically Signed   By: Francis Quam M.D.   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  Notes significant improvement in symptoms with IV Lasix . proBNP 1007 CXR consistent with CHF or fluid overload. 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 I/O -5044 ml, 294 > 282 lbs (292 on 04/19/2024). Order [employer].  Continue to monitor I's/O's, daily weight, renal function Still appears slightly volume overloaded.  Continue IV Lasix  40 mg twice daily.  Can consider converting to p.o. tomorrow. Continue losartan  25 mg daily, Toprol  XL 100 mg daily Discussed SGLT2i. Denies any hx of UTI. Order Jardiance 10  mg  Discontinued ACEI previously due to cough.  Suspect secondary to salt overloading and excessive water intake Encouraged < 2000 mg of Na and < 64 oz of water daily.   Hypokalemia Initial K 3, Mg 2.2; treated with K supplement now  3.5 Continue to monitor  A-fib Recurrent episodes of SVT  Telemetry: Sinus rhythm 60 to 70s without any signs of recurrent A-fib or SVT Continue on amiodarone  200 mg BID, Toprol  XL 100 mg daily and Eliquis  5 mg BID  HTN BP well-controlled and on soft side also 106/68 Continue losartan  25 mg daily, Toprol  XL 100 mg daily and Lasix  IV Continue to monitor  HLD  FLP pending Patient not on any statin  Transposition of great vessels s/p repair with mustard procedure in 1980  Risk Assessment/Risk Scores:  [place]  Heart Association (NYHA) Functional Class NYHA Class IV  CHA2DS2-VASc Score = 3  This indicates a 3.2% annual risk of stroke. The patient's score is based upon: CHF History: 1 HTN History: 1 Diabetes History: 0 Stroke History: 0 Vascular Disease History: 1 Age Score: 0 Gender Score: 0    For questions or updates, please contact [facility] Please consult [email or web address] for contact info under     Signed, Lorette CINDERELLA Kapur, PA-C  04/21/2024 12:21 PM

## 2024-04-21 NOTE — Progress Notes (Signed)
 [facility] PROGRESS NOTE  Matthew [name] (DOB: 1976/05/06) FMW:[id number] PCP: Cook, Jayce G, DO  Brief Narrative: This 48 y.o. male with PMH significant for transposition of great vessels, s/p repair with mustard procedure, hypertension, hyperlipidemia, pulmonary hypertension, A-fib on Eliquis , anxiety/depression, HFr EF(recent echo 35 to 40% in 2017) presented in the ED with complaints of shortness of breath for few days.  Patient was seen in the ED a week ago with SVT, he underwent cardioversion and was discharged on amiodarone .  Patient has been adherent to the amiodarone  and other home medications.  Patient reports he has developed shortness of breath for last 3 days, unable to lie flat,  has been using 2 pillows to sleep.  He is found to have proBNP 1007, troponin 15 x 3, chest x-ray shows cardiomegaly with increase vascular engorgement with interstitial edema of lung fields.  Patient admitted for further evaluation.   Subjective: No new complaints, feels dyspnea is improved. No chest pain.   Objective: BP 108/77 (BP Location: Left Arm)   Pulse 64   Temp 98.1 F (36.7 C) (Oral)   Resp 20   Ht 6' 2.5 (1.892 m)   Wt 127.4 kg   SpO2 95%   BMI 35.58 kg/m   Gen: No distress Pulm: Clear, nonlabored  CV: RRR, no MRG, trace edema GI: Soft, NT, ND, +BS  Neuro: Alert and oriented. No new focal deficits. Ext: Warm, no deformities. Skin: No rashes, lesions or ulcers on visualized skin   Assessment & Plan: Acute hypoxic respiratory failure: Acute on chronic systolic CHF: Patient presented with cough, progressive shortness of breath, hypoxia, orthopnea, elevated BNP. Chest x-ray shows findings consistent with cardiomegaly with interstitial edema. - Echo this admission showed moderate systolic dysfunction of the systemic ventricle/RV and moderate dilatation of the systemic ventricle/RV. Pulmonary ventricle/LV is small in size and has normal function.  - Cardiology consulted  given his complex SVT history and TGA. Appreciate their help, we will continue IV lasix  today, likely switch to po in AM.  - Continue GDMT, [facility] follow up.  - Monitor daily weight, intake output charting.    Atrial fibrillation, Paroxysmal recurrent SVT: Heart rate well-controlled. Continue amiodarone , metoprolol  and Eliquis . - F/u with [facility] EP   Dyslipidemia; Obtain lipid profile, patient not on any statins.   Hypertension: Continue losartan , metoprolol  and Lasix .   Transposition of great vessels: Status post repair.   Hypokalemia: Replaced.  Continue to monitor   Leukocytosis: No signs of any infection,  continue to monitor  Bernardino KATHEE Come, MD [facility] [email or web address] 04/21/2024, 5:18 PM

## 2024-04-22 ENCOUNTER — Encounter: Payer: Self-pay | Admitting: Family Medicine

## 2024-04-22 DIAGNOSIS — I5023 Acute on chronic systolic (congestive) heart failure: Secondary | ICD-10-CM | POA: Diagnosis not present

## 2024-04-22 DIAGNOSIS — Z8774 Personal history of (corrected) congenital malformations of heart and circulatory system: Secondary | ICD-10-CM | POA: Diagnosis not present

## 2024-04-22 DIAGNOSIS — I471 Supraventricular tachycardia, unspecified: Secondary | ICD-10-CM | POA: Diagnosis not present

## 2024-04-22 LAB — BASIC METABOLIC PANEL WITH GFR
Anion gap: 13 (ref 5–15)
BUN: 14 mg/dL (ref 6–20)
CO2: 26 mmol/L (ref 22–32)
Calcium: 9.6 mg/dL (ref 8.9–10.3)
Chloride: 98 mmol/L (ref 98–111)
Creatinine, Ser: 0.9 mg/dL (ref 0.61–1.24)
GFR, Estimated: >60 mL/min (ref >60)
Glucose, Bld: 98 mg/dL (ref 70–99)
Potassium: 3.3 mmol/L — ABNORMAL LOW (ref 3.5–5.1)
Sodium: 138 mmol/L (ref 135–145)

## 2024-04-22 LAB — LIPID PANEL
Cholesterol: 212 mg/dL — ABNORMAL HIGH (ref 0–200)
HDL: 29 mg/dL — ABNORMAL LOW (ref >40)
LDL Cholesterol: 160 mg/dL — ABNORMAL HIGH (ref 0–99)
Total CHOL/HDL Ratio: 7.4 ratio
Triglycerides: 117 mg/dL (ref <150)
VLDL: 23 mg/dL (ref 0–40)

## 2024-04-22 MED ORDER — POTASSIUM CHLORIDE CRYS ER 20 MEQ PO TBCR
40.0000 meq | EXTENDED_RELEASE_TABLET | Freq: Once | ORAL | Status: AC
  Administered 2024-04-22: 40 meq via ORAL
  Filled 2024-04-22: qty 2

## 2024-04-22 MED ORDER — HYDROXYZINE PAMOATE 25 MG PO CAPS: 25.0000 mg | ORAL_CAPSULE | Freq: Three times a day (TID) | ORAL | 0 refills | Status: DC | PRN

## 2024-04-22 MED ORDER — TORSEMIDE 20 MG PO TABS: 20.0000 mg | ORAL_TABLET | Freq: Every day | ORAL | 0 refills | Status: DC

## 2024-04-22 MED ORDER — EMPAGLIFLOZIN 10 MG PO TABS: 10.0000 mg | ORAL_TABLET | Freq: Every day | ORAL | 0 refills | Status: DC

## 2024-04-22 MED ORDER — ATORVASTATIN CALCIUM 40 MG PO TABS: 40.0000 mg | ORAL_TABLET | Freq: Every day | ORAL | 0 refills | Status: DC

## 2024-04-22 MED ORDER — ATORVASTATIN CALCIUM 40 MG PO TABS
40.0000 mg | ORAL_TABLET | Freq: Every day | ORAL | Status: DC
Start: 2024-04-22 — End: 2024-04-22
  Administered 2024-04-22: 40 mg via ORAL
  Filled 2024-04-22: qty 1

## 2024-04-22 MED ORDER — POTASSIUM CHLORIDE CRYS ER 20 MEQ PO TBCR: 20.0000 meq | EXTENDED_RELEASE_TABLET | Freq: Every day | ORAL | 0 refills | Status: DC

## 2024-04-22 MED ORDER — POTASSIUM CHLORIDE CRYS ER 20 MEQ PO TBCR: 20.0000 meq | EXTENDED_RELEASE_TABLET | Freq: Every day | ORAL | Status: DC

## 2024-04-22 MED ORDER — TORSEMIDE 20 MG PO TABS
20.0000 mg | ORAL_TABLET | Freq: Every day | ORAL | Status: DC
  Administered 2024-04-22: 20 mg via ORAL
  Filled 2024-04-22: qty 1

## 2024-04-22 NOTE — Progress Notes (Signed)
 Rounding Note   Patient Name: Matthew [name] Date of Encounter: 04/22/2024  [facility] Cardiologist: Vina Gull, MD   Subjective Patient denies any complaints. He feels like he is back at baseline. He was able to walk multiple laps in the hallway without any concerns.   Scheduled Meds:  amiodarone   200 mg Oral BID   apixaban   5 mg Oral BID   docusate sodium  100 mg Oral BID   empagliflozin  10 mg Oral Daily   furosemide   40 mg Intravenous Q12H   losartan   25 mg Oral Daily   metoprolol  succinate  100 mg Oral Daily   potassium chloride   40 mEq Oral Once   Continuous Infusions:  PRN Meds: acetaminophen  **OR** acetaminophen , guaiFENesin-dextromethorphan, hydrOXYzine , ondansetron  **OR** ondansetron  (ZOFRAN ) IV   Vital Signs  Vitals:   04/21/24 1456 04/21/24 1932 04/22/24 0452 04/22/24 0455  BP: 108/77 119/84  105/61  Pulse: 64 72  63  Resp: 20 16  18   Temp: 98.1 F (36.7 C) 98.8 F (37.1 C)  97.6 F (36.4 C)  TempSrc: Oral Oral  Oral  SpO2: 95% 95%  99%  Weight:   131 kg   Height:        Intake/Output Summary (Last 24 hours) at 04/22/2024 0928 Last data filed at 04/22/2024 0800 Gross per 24 hour  Intake 120 ml  Output 5001 ml  Net -4881 ml      04/22/2024    4:52 AM 04/21/2024   11:00 AM 04/21/2024    5:00 AM  Last 3 Weights  Weight (lbs) 288 lb 12.8 oz 280 lb 13.9 oz 282 lb 3 oz  Weight (kg) 131 kg 127.4 kg 128 kg      Telemetry NSR, HR 60-70's - Personally Reviewed   Physical Exam GEN: No acute distress.   Neck: No JVD Cardiac: RRR, no murmurs, rubs, or gallops.  Respiratory: Clear to auscultation bilaterally. GI: Soft, nontender, non-distended  MS: No edema; No deformity. Neuro:  Nonfocal  Psych: Normal affect   Labs High Sensitivity Troponin:  No results for input(s): TROPONINIHS in the last 720 hours.   Chemistry Recent Labs  Lab 04/19/24 0654 04/20/24 0512 04/22/24 0516  NA 138 139 138  K 3.0* 3.5 3.3*  CL 99 102 98   CO2 21* 22 26  GLUCOSE 172* 96 98  BUN 9 11 14   CREATININE 0.88 0.75 0.90  CALCIUM  9.4 9.0 9.6  MG  --  2.2  --   PROT 7.6 7.1  --   ALBUMIN 4.3 3.9  --   AST 17 13*  --   ALT 20 15  --   ALKPHOS 55 48  --   BILITOT 1.1 0.7  --   GFRNONAA >60 >60 >60  ANIONGAP 19* 15 13    Lipids  Recent Labs  Lab 04/22/24 0516  CHOL 212*  TRIG 117  HDL 29*  LDLCALC 160*  CHOLHDL 7.4    Hematology Recent Labs  Lab 04/19/24 0654 04/20/24 0512  WBC 15.2* 8.1  RBC 4.40 4.39  HGB 14.8 14.5  HCT 44.1 43.5  MCV 100.2* 99.1  MCH 33.6 33.0  MCHC 33.6 33.3  RDW 13.4 13.3  PLT 252 231   Thyroid  Recent Labs  Lab 04/19/24 0655  TSH 2.210    BNP Recent Labs  Lab 04/19/24 0654  PROBNP 1,007.0*    DDimer No results for input(s): DDIMER in the last 168 hours.   Radiology  ECHOCARDIOGRAM COMPLETE  Result Date: 04/20/2024    ECHOCARDIOGRAM REPORT   Patient Name:   Matthew [name] Date of Exam: 04/20/2024 Medical Rec #:  [id number]           Height:       74.5 in Accession #:    [id number]          Weight:       282.4 lb Date of Birth:  09-14-1975           BSA:          2.528 m Patient Age:    48 years            BP:           109/89 mmHg Patient Gender: M                   HR:           70 bpm. Exam Location:  [facility] Procedure: 2D Echo, Cardiac Doppler and Color Doppler (Both Spectral and Color            Flow Doppler were utilized during procedure). Indications:    I50.40* Unspecified combined systolic (congestive) and diastolic                 (congestive) heart failure  History:        Patient has prior history of Echocardiogram examinations, most                 recent 08/21/2015. CHF and RV failure, Abnormal ECG, Pulmonary                 HTN, Arrythmias:Atrial Fibrillation, Atrial Flutter and                 Tachycardia, Signs/Symptoms:Altered Mental Status; Risk                 Factors:Hypertension and Dyslipidemia. Congenital heart disease.                 D transposition of  great arteries. S/P Mustard procedure in                 infancy. Patient followed at [facility]. ETOH.  Sonographer:    Ellouise Mose RDCS Referring Phys: [id number] [facility]  Sonographer Comments: Technically difficult study due to poor echo windows. Image acquisition challenging due to patient body habitus. TR gradients are systemic regurgitation. IMPRESSIONS  1. 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  2. Overall does not appear to be signficantly changed from echo done at [facility] in Dec 2024.  3. Poor acoustic windows limit study.  4. Anatomic LV (pulmonic ventricle) is small. There is septal flattening Overall systolic function normal (55%). Left ventricular diastolic parameters are indeterminate.  5. RV (systemic ventricle) is moderately dilated and ventricular function is moderately reduced RVEF approximately 40%. Severely increased right ventricular wall thickness.  6. S/p Mustard Unable see flow through baffles.  7. Small to moderate.  8. The mitral valve is normal in structure. Trivial mitral valve regurgitation.  9. Tricuspid valve regurgitation is moderate. 10. 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 Trileaflet Mild AI. The aortic valve is tricuspid. Aortic valve regurgitation is mild. 11. Pulmonic valve arises from anatomic LV, posterior to AV Difficult to see leaflets well Trace PI. FINDINGS  Left Ventricle: Anatomic LV (pulmonic ventricle) is small. There is septal flattening Overall systolic function normal (55%). The left ventricular internal cavity size was  small. There is no left ventricular hypertrophy. Left ventricular diastolic parameters are indeterminate. Right Ventricle: RV (systemic ventricle) is moderately dilated and ventricular function is moderately reduced RVEF approximately 40%. Severely increased right ventricular wall thickness. Left Atrium: S/p Mustard Unable see flow through baffles. Pericardium: Small to moderate. Mitral Valve: The mitral valve is  normal in structure. Trivial mitral valve regurgitation. Tricuspid Valve: The tricuspid valve is normal in structure. Tricuspid valve regurgitation is moderate. 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 Trileaflet Mild AI. The aortic valve is tricuspid. Aortic valve regurgitation is mild. Pulmonic Valve: The pulmonic valve was not well visualized. Pulmonary Artery: Pulmonic valve arises from anatomic LV, posterior to AV Difficult to see leaflets well Trace PI. Venous: The inferior vena cava was not well visualized.  LEFT VENTRICLE PLAX 2D LVIDd:         4.60 cm     Diastology LVIDs:         3.80 cm     LV e' medial:  4.90 cm/s LV PW:         0.90 cm     LV e' lateral: 7.72 cm/s LV IVS:        0.90 cm LVOT diam:     2.50 cm LV SV:         92 LV SV Index:   36 LVOT Area:     4.91 cm  LV Volumes (MOD) LV vol d, MOD A2C: 46.6 ml LV vol d, MOD A4C: 95.0 ml LV vol s, MOD A2C: 23.6 ml LV vol s, MOD A4C: 35.2 ml LV SV MOD A2C:     23.0 ml LV SV MOD A4C:     95.0 ml LV SV MOD BP:      37.2 ml RIGHT VENTRICLE            IVC RV S prime:     3.48 cm/s  IVC diam: 2.00 cm TAPSE (M-mode): 1.1 cm LEFT ATRIUM         Index       RIGHT ATRIUM           Index LA diam:    4.40 cm 1.74 cm/m  RA Area:     31.50 cm                                 RA Volume:   119.00 ml 47.07 ml/m  AORTIC VALVE             PULMONIC VALVE LVOT Vmax:   97.40 cm/s  PR End Diast Vel: 2.17 msec LVOT Vmean:  61.300 cm/s LVOT VTI:    0.187 m  AORTA Ao Asc diam: 3.70 cm TRICUSPID VALVE TV Peak grad:   10.2 mmHg TV Mean grad:   3.0 mmHg TV Vmax:        1.60 m/s TV Vmean:       78.1 cm/s TV VTI:         0.44 msec TR Peak grad:   70.9 mmHg TR Vmax:        421.00 cm/s  SHUNTS Systemic VTI:  0.19 m Systemic Diam: 2.50 cm Vina Gull MD Electronically signed by Vina Gull MD Signature Date/Time: 04/20/2024/6:02:09 PM    Final     Cardiac Studies ECHO IMPRESSIONS 04/20/2024  1. D Transposition of great vessels. Pt s/p Mustard procedure  1980.   2. Overall does not appear to  be signficantly changed from echo done at  [facility] in Dec 2024.   3. Poor acoustic windows limit study.   4. Anatomic LV (pulmonic ventricle) is small. There is septal flattening  Overall systolic function normal (55%). Left ventricular diastolic  parameters are indeterminate.   5. RV (systemic ventricle) is moderately dilated and ventricular function  is moderately reduced RVEF approximately 40%. Severely increased right  ventricular wall thickness.   6. S/p Mustard Unable see flow through baffles.   7. Small to moderate.   8. The mitral valve is normal in structure. Trivial mitral valve  regurgitation.   9. Tricuspid valve regurgitation is moderate.  10. 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 Trileaflet Mild  AI. The aortic valve is tricuspid. Aortic valve regurgitation is mild.  11. Pulmonic valve arises from anatomic LV, posterior to AV Difficult to  see leaflets well Trace PI.   Patient Profile   48 y.o. 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 Bryn.   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  Notes significant improvement in symptoms with IV Lasix . proBNP 1007 CXR consistent with CHF or fluid overload. 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 I/O -9205 ml, 294 > 288 lbs (292 on 04/19/2024).  Appears Euvolemic on exam. D/C IV lasix . Order Torsemide 20 mg PO. Send home with KCL 20 MEQ daily.   Continue losartan  25 mg daily, Toprol  XL 100 mg daily, Jardiance 10 mg daily Discontinued ACEI previously due to cough.  Suspect secondary to salt overloading and excessive water intake Encouraged < 2000 mg of Na and < 64 oz of water daily.  Patient is at baseline and safe for discharge from cardio standpoint with discuss with MD.   Hypokalemia Initial K 3, Mg 2.2; treated with K supplement then back up to 3.5 This am k 3.3. Treated with K supplement again.  Continue to monitor   A-fib Recurrent episodes of SVT  Telemetry: Sinus rhythm 60 to 70s without any signs of recurrent A-fib or SVT Continue on amiodarone  200 mg BID, Toprol  XL 100 mg daily and Eliquis  5 mg BID   HTN BP well-controlled and on soft side also 105/61 Continue losartan  25 mg daily, Toprol  XL 100 mg daily and Lasix  IV Continue to monitor   HLD  LDL 160  Patient not previous muscle aches on statin but does not recall med or dose. Patient agrees to start statin. Order Lipitor 40 mg.  Discussed if muscle aches then can  decrease to 20 mg.  Will plan for repeat FLP and LFT in 3 months.  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

## 2024-04-22 NOTE — Discharge Summary (Signed)
 Physician Discharge Summary   Patient: Matthew [name] MRN: [id number] DOB: 07-24-1975  Admit date:     04/19/2024  Discharge date: 04/22/24  Discharge Physician: Bernardino KATHEE Come   PCP: Cook, Jayce G, DO   Recommendations at discharge:  Follow up with PCP in 1-2 weeks. Needs monitoring of BP, volume status, and BMP at follow up. Starting statin, suggest recheck FLP and LFTs in 3 months.  Note medication changes for GDMT for HFrEF including change from lasix  to torsemide, addition of 20 mEq potassium daily and jardiance 10mg  daily. Follow up with [facility] and EP.   Discharge Diagnoses: Principal Problem:   Acute on chronic systolic CHF (congestive heart failure), NYHA class 3 (HCC) Active Problems:   Essential hypertension   Pulmonary hypertension (HCC)   Atrial fibrillation (HCC)   Dyslipidemia   Hx of complete transposition of great vessels   Anxiety and depression   Hospital Course: This 48 y.o. male with PMH significant for transposition of great vessels, s/p repair with mustard procedure, hypertension, hyperlipidemia, pulmonary hypertension, A-fib on Eliquis , anxiety/depression, HFr EF(recent echo 35 to 40% in 2017) presented in the ED with complaints of shortness of breath for few days.  Patient was seen in the ED a week ago with SVT, he underwent cardioversion and was discharged on amiodarone .  Patient has been adherent to the amiodarone  and other home medications.  Patient reports he has developed shortness of breath for last 3 days, unable to lie flat,  has been using 2 pillows to sleep.  He is found to have proBNP 1007, troponin 15 x 3, chest x-ray shows cardiomegaly with increase vascular engorgement with interstitial edema of lung fields.  Patient admitted for further evaluation.   Assessment and Plan: Acute hypoxic respiratory failure: Resolved  Acute on chronic systolic CHF: Patient presented with cough, progressive shortness of breath, hypoxia, orthopnea,  elevated BNP. Chest x-ray shows findings consistent with cardiomegaly with interstitial edema. - Echo this admission showed moderate systolic dysfunction of the systemic ventricle/RV and moderate dilatation of the systemic ventricle/RV. Pulmonary ventricle/LV is small in size and has normal function.  - Cardiology consulted given his complex SVT history and TGA. Appreciate their help, we will switch to torsemide 20mg  daily. Continue remaining GDMT, metoprolol  succinate 100 mg once daily, losartan  25 mg once daily, Jardiance 10 mg once daily.  - For recurrent SVT, adenosine  sensitive and requiring multiple DCCV (last DCCV on 04/14/2024), continue p.o. amiodarone  200 mg twice daily. He will follow-up with [facility] electrophysiology to determine rhythm control, antiarrhythmics versus ablation.      Atrial fibrillation, Paroxysmal recurrent SVT: Remained in NSR throughout admission, rate 60-70's.  - Continue amiodarone , metoprolol  and Eliquis . - F/u with [facility] EP   Dyslipidemia: LDL is 160.  - Start atorvastatin  40mg , repeat FLP and LFTs in 3 months.    Hypertension: Continue losartan , metoprolol  and switched to torsemide as above.    Transposition of great vessels: Status post repair.   Hypokalemia: Replaced.  Continue to monitor   Leukocytosis: No signs of any infection,  continue to monitor  Anxiety: Hydroxyzine  prn prescribed.   Consultants: Cardiology Procedures performed: Echo  Disposition: Home Diet recommendation:  Cardiac diet DISCHARGE MEDICATION: Allergies as of 04/22/2024       Reactions   Ace Inhibitors Cough   Aspirin Other (See Comments)   Cardiologist told pt not to take        Medication List     STOP taking these medications  furosemide  20 MG tablet Commonly known as: LASIX        TAKE these medications    acetaminophen  500 MG tablet Commonly known as: TYLENOL  Take 1,000 mg by mouth every 6 (six) hours as needed for headache (pain).   amiodarone   200 MG tablet Commonly known as: Pacerone  Take 1 tablet (200 mg total) by mouth 2 (two) times daily.   apixaban  5 MG Tabs tablet Commonly known as: ELIQUIS  Take 1 tablet (5 mg total) by mouth 2 (two) times daily.   atorvastatin  40 MG tablet Commonly known as: LIPITOR Take 1 tablet (40 mg total) by mouth daily. Start taking on: April 23, 2024   empagliflozin 10 MG Tabs tablet Commonly known as: JARDIANCE Take 1 tablet (10 mg total) by mouth daily. Start taking on: April 23, 2024   hydrOXYzine  25 MG capsule Commonly known as: VISTARIL  Take 1 capsule (25 mg total) by mouth every 8 (eight) hours as needed. What changed: See the new instructions.   losartan  25 MG tablet Commonly known as: COZAAR  Take 25 mg by mouth daily.   metoprolol  succinate 100 MG 24 hr tablet Commonly known as: TOPROL -XL Take 100 mg by mouth daily.   multivitamin with minerals Tabs tablet Take 1 tablet by mouth daily.   potassium chloride  SA 20 MEQ tablet Commonly known as: KLOR-CON  M Take 1 tablet (20 mEq total) by mouth daily. Start taking on: April 23, 2024   torsemide 20 MG tablet Commonly known as: DEMADEX Take 1 tablet (20 mg total) by mouth daily. Start taking on: April 23, 2024        Follow-up Information     Cook, Jayce G, DO Follow up.   Specialty: Family Medicine Contact information: [address] [place] [address] [phone]                Discharge Exam: Fredricka Weights   04/21/24 0500 04/21/24 1100 04/22/24 0452  Weight: 128 kg 127.4 kg 131 kg  BP 105/61 (BP Location: Right Arm)   Pulse 63   Temp 97.6 F (36.4 C) (Oral)   Resp 18   Ht 6' 2.5 (1.892 m)   Wt 131 kg   SpO2 99%   BMI 36.58 kg/m   Well-appearing male in no distress clear, nonlabored RRR, no MRG, no edema  Condition at discharge: stable  The results of significant diagnostics from this hospitalization (including imaging, microbiology, ancillary and laboratory) are listed  below for reference.   Imaging Studies: ECHOCARDIOGRAM COMPLETE Result Date: 04/20/2024    ECHOCARDIOGRAM REPORT   Patient Name:   ELISHUA RADFORD Date of Exam: 04/20/2024 Medical Rec #:  [id number]           Height:       74.5 in Accession #:    [id number]          Weight:       282.4 lb Date of Birth:  1976/03/19           BSA:          2.528 m Patient Age:    48 years            BP:           109/89 mmHg Patient Gender: M                   HR:           70 bpm. Exam Location:  [facility] Procedure: 2D Echo, Cardiac  Doppler and Color Doppler (Both Spectral and Color            Flow Doppler were utilized during procedure). Indications:    I50.40* Unspecified combined systolic (congestive) and diastolic                 (congestive) heart failure  History:        Patient has prior history of Echocardiogram examinations, most                 recent 08/21/2015. CHF and RV failure, Abnormal ECG, Pulmonary                 HTN, Arrythmias:Atrial Fibrillation, Atrial Flutter and                 Tachycardia, Signs/Symptoms:Altered Mental Status; Risk                 Factors:Hypertension and Dyslipidemia. Congenital heart disease.                 D transposition of great arteries. S/P Mustard procedure in                 infancy. Patient followed at [facility]. ETOH.  Sonographer:    Ellouise Mose RDCS Referring Phys: [id number] [facility]  Sonographer Comments: Technically difficult study due to poor echo windows. Image acquisition challenging due to patient body habitus. TR gradients are systemic regurgitation. IMPRESSIONS  1. 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  2. Overall does not appear to be signficantly changed from echo done at [facility] in Dec 2024.  3. Poor acoustic windows limit study.  4. Anatomic LV (pulmonic ventricle) is small. There is septal flattening Overall systolic function normal (55%). Left ventricular diastolic parameters are indeterminate.  5. RV (systemic ventricle) is moderately  dilated and ventricular function is moderately reduced RVEF approximately 40%. Severely increased right ventricular wall thickness.  6. S/p Mustard Unable see flow through baffles.  7. Small to moderate.  8. The mitral valve is normal in structure. Trivial mitral valve regurgitation.  9. Tricuspid valve regurgitation is moderate. 10. 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 Trileaflet Mild AI. The aortic valve is tricuspid. Aortic valve regurgitation is mild. 11. Pulmonic valve arises from anatomic LV, posterior to AV Difficult to see leaflets well Trace PI. FINDINGS  Left Ventricle: Anatomic LV (pulmonic ventricle) is small. There is septal flattening Overall systolic function normal (55%). The left ventricular internal cavity size was small. There is no left ventricular hypertrophy. Left ventricular diastolic parameters are indeterminate. Right Ventricle: RV (systemic ventricle) is moderately dilated and ventricular function is moderately reduced RVEF approximately 40%. Severely increased right ventricular wall thickness. Left Atrium: S/p Mustard Unable see flow through baffles. Pericardium: Small to moderate. Mitral Valve: The mitral valve is normal in structure. Trivial mitral valve regurgitation. Tricuspid Valve: The tricuspid valve is normal in structure. Tricuspid valve regurgitation is moderate. 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 Trileaflet Mild AI. The aortic valve is tricuspid. Aortic valve regurgitation is mild. Pulmonic Valve: The pulmonic valve was not well visualized. Pulmonary Artery: Pulmonic valve arises from anatomic LV, posterior to AV Difficult to see leaflets well Trace PI. Venous: The inferior vena cava was not well visualized.  LEFT VENTRICLE PLAX 2D LVIDd:         4.60 cm     Diastology LVIDs:         3.80 cm  LV e' medial:  4.90 cm/s LV PW:         0.90 cm     LV e' lateral: 7.72 cm/s LV IVS:        0.90 cm LVOT diam:     2.50 cm LV SV:          92 LV SV Index:   36 LVOT Area:     4.91 cm  LV Volumes (MOD) LV vol d, MOD A2C: 46.6 ml LV vol d, MOD A4C: 95.0 ml LV vol s, MOD A2C: 23.6 ml LV vol s, MOD A4C: 35.2 ml LV SV MOD A2C:     23.0 ml LV SV MOD A4C:     95.0 ml LV SV MOD BP:      37.2 ml RIGHT VENTRICLE            IVC RV S prime:     3.48 cm/s  IVC diam: 2.00 cm TAPSE (M-mode): 1.1 cm LEFT ATRIUM         Index       RIGHT ATRIUM           Index LA diam:    4.40 cm 1.74 cm/m  RA Area:     31.50 cm                                 RA Volume:   119.00 ml 47.07 ml/m  AORTIC VALVE             PULMONIC VALVE LVOT Vmax:   97.40 cm/s  PR End Diast Vel: 2.17 msec LVOT Vmean:  61.300 cm/s LVOT VTI:    0.187 m  AORTA Ao Asc diam: 3.70 cm TRICUSPID VALVE TV Peak grad:   10.2 mmHg TV Mean grad:   3.0 mmHg TV Vmax:        1.60 m/s TV Vmean:       78.1 cm/s TV VTI:         0.44 msec TR Peak grad:   70.9 mmHg TR Vmax:        421.00 cm/s  SHUNTS Systemic VTI:  0.19 m Systemic Diam: 2.50 cm Vina Gull MD Electronically signed by Vina Gull MD Signature Date/Time: 04/20/2024/6:02:09 PM    Final    DG Chest Port 1 View Result Date: 04/19/2024 CLINICAL DATA:  Shortness of breath onset last night. Started a new medication four days ago. EXAM: PORTABLE CHEST 1 VIEW COMPARISON:  Portable chest 01/01/2024. FINDINGS: 6:58 a.m. The heart is moderately enlarged. There is perihilar vascular engorgement. There is interstitial edema of the lung fields with a basal gradient and small pleural effusions. Findings consistent with CHF or fluid overload. There are bilateral infrahilar opacities of the lungs which could be due to alveolar edema or other airspace disease. There are median sternotomy sutures. The mediastinum is stable. There is thoracic spondylosis. IMPRESSION: 1. Cardiomegaly with perihilar vascular engorgement and interstitial edema of the lung fields with small pleural effusions. Findings consistent with CHF or fluid overload. 2. Bilateral infrahilar opacities which  could be due to alveolar edema or other airspace disease. Electronically Signed   By: Francis Quam M.D.   On: 04/19/2024 07:10    Microbiology: Results for orders placed or performed during the hospital encounter of 04/19/24  Resp panel by RT-PCR (RSV, Flu A&B, Covid) Anterior Nasal Swab     Status: None   Collection Time: 04/19/24  8:04  AM   Specimen: Anterior Nasal Swab  Result Value Ref Range Status   SARS Coronavirus 2 by RT PCR NEGATIVE NEGATIVE Final    Comment: (NOTE) SARS-CoV-2 target nucleic acids are NOT DETECTED.  The SARS-CoV-2 RNA is generally detectable in upper respiratory specimens during the acute phase of infection. The lowest concentration of SARS-CoV-2 viral copies this assay can detect is 138 copies/mL. A negative result does not preclude SARS-Cov-2 infection and should not be used as the sole basis for treatment or other patient management decisions. A negative result may occur with  improper specimen collection/handling, submission of specimen other than nasopharyngeal swab, presence of viral mutation(s) within the areas targeted by this assay, and inadequate number of viral copies(<138 copies/mL). A negative result must be combined with clinical observations, patient history, and epidemiological information. The expected result is Negative.  Fact Sheet for Patients:  BloggerCourse.com  Fact Sheet for Healthcare Providers:  SeriousBroker.it  This test is no t yet approved or cleared by the [place]  FDA and  has been authorized for detection and/or diagnosis of SARS-CoV-2 by FDA under an Emergency Use Authorization (EUA). This EUA will remain  in effect (meaning this test can be used) for the duration of the COVID-19 declaration under Section 564(b)(1) of the Act, 21 U.S.C.section 360bbb-3(b)(1), unless the authorization is terminated  or revoked sooner.       Influenza A by PCR NEGATIVE NEGATIVE  Final   Influenza B by PCR NEGATIVE NEGATIVE Final    Comment: (NOTE) The Xpert Xpress SARS-CoV-2/FLU/RSV plus assay is intended as an aid in the diagnosis of influenza from Nasopharyngeal swab specimens and should not be used as a sole basis for treatment. Nasal washings and aspirates are unacceptable for Xpert Xpress SARS-CoV-2/FLU/RSV testing.  Fact Sheet for Patients: BloggerCourse.com  Fact Sheet for Healthcare Providers: SeriousBroker.it  This test is not yet approved or cleared by the [place]  FDA and has been authorized for detection and/or diagnosis of SARS-CoV-2 by FDA under an Emergency Use Authorization (EUA). This EUA will remain in effect (meaning this test can be used) for the duration of the COVID-19 declaration under Section 564(b)(1) of the Act, 21 U.S.C. section 360bbb-3(b)(1), unless the authorization is terminated or revoked.     Resp Syncytial Virus by PCR NEGATIVE NEGATIVE Final    Comment: (NOTE) Fact Sheet for Patients: BloggerCourse.com  Fact Sheet for Healthcare Providers: SeriousBroker.it  This test is not yet approved or cleared by the [place]  FDA and has been authorized for detection and/or diagnosis of SARS-CoV-2 by FDA under an Emergency Use Authorization (EUA). This EUA will remain in effect (meaning this test can be used) for the duration of the COVID-19 declaration under Section 564(b)(1) of the Act, 21 U.S.C. section 360bbb-3(b)(1), unless the authorization is terminated or revoked.  Performed at [facility], [address] [place], [place] [address]     Labs: CBC: Recent Labs  Lab 04/19/24 0654 04/20/24 0512  WBC 15.2* 8.1  NEUTROABS 11.0*  --   HGB 14.8 14.5  HCT 44.1 43.5  MCV 100.2* 99.1  PLT 252 231   Basic Metabolic Panel: Recent Labs  Lab 04/19/24 0654 04/20/24 0512 04/22/24 0516  NA 138 139 138  K 3.0*  3.5 3.3*  CL 99 102 98  CO2 21* 22 26  GLUCOSE 172* 96 98  BUN 9 11 14   CREATININE 0.88 0.75 0.90  CALCIUM  9.4 9.0 9.6  MG  --  2.2  --   PHOS  --  4.4  --    Liver Function Tests: Recent Labs  Lab 04/19/24 0654 04/20/24 0512  AST 17 13*  ALT 20 15  ALKPHOS 55 48  BILITOT 1.1 0.7  PROT 7.6 7.1  ALBUMIN 4.3 3.9   CBG: No results for input(s): GLUCAP in the last 168 hours.  Discharge time spent: greater than 30 minutes.  Signed: Bernardino KATHEE Come, MD [facility] 04/22/2024

## 2024-04-22 NOTE — Progress Notes (Signed)
 Mobility Specialist Progress Note:    04/22/24 1030  Mobility  Activity Ambulated with assistance  Level of Assistance Independent  Assistive Device None  Distance Ambulated (ft) 480 ft  Range of Motion/Exercises Active;All extremities  Activity Response Tolerated well  Mobility Referral Yes  Mobility visit 1 Mobility  Mobility Specialist Start Time (ACUTE ONLY) 1030  Mobility Specialist Stop Time (ACUTE ONLY) 1050  Mobility Specialist Time Calculation (min) (ACUTE ONLY) 20 min   Pt received in bed, LPN requesting O2 test. Independently able to stand and ambulate with no AD. Tolerated well, SpO2 98% on RA. SpO2 93-97% on RA during ambulation. Returned supine, all needs met.  Gerrianne Aydelott Mobility Specialist Please contact via Special educational needs teacher or  Rehab office at [phone]

## 2024-04-22 NOTE — Plan of Care (Signed)

## 2024-04-22 NOTE — Plan of Care (Signed)

## 2024-04-22 NOTE — Progress Notes (Signed)
 Mobility Specialist Progress Note:    04/22/24 0915  Mobility  Activity Ambulated with assistance  Level of Assistance Independent  Assistive Device None  Distance Ambulated (ft) 700 ft  Range of Motion/Exercises Active;All extremities  Activity Response Tolerated well  Mobility Referral Yes  Mobility visit 1 Mobility  Mobility Specialist Start Time (ACUTE ONLY) 0915  Mobility Specialist Stop Time (ACUTE ONLY) 0935  Mobility Specialist Time Calculation (min) (ACUTE ONLY) 20 min   Pt received in bed, agreeable to mobility. Independently able to stand and ambulate with no AD. Tolerated well, asx throughout. Returned supine, all needs met.  Jaretzi Droz Mobility Specialist Please contact via Special educational needs teacher or  Rehab office at [phone]

## 2024-04-22 NOTE — Progress Notes (Signed)
 Pt urinated 2500 ml of urine overnight and was able to ambulate without shortness of breath.

## 2024-04-23 ENCOUNTER — Telehealth: Payer: Self-pay | Admitting: *Deleted

## 2024-04-23 ENCOUNTER — Telehealth (HOSPITAL_COMMUNITY): Payer: Self-pay | Admitting: Pharmacy Technician

## 2024-04-23 DIAGNOSIS — I509 Heart failure, unspecified: Secondary | ICD-10-CM

## 2024-04-23 NOTE — Transitions of Care (Post Inpatient/ED Visit) (Signed)
 04/23/2024  Name: Matthew [name] MRN: [id number] DOB: 1976/06/26  Today's TOC FU Call Status: Today's TOC FU Call Status:: Successful TOC FU Call Completed TOC FU Call Complete Date: 04/23/24 Patient's Name and Date of Birth confirmed.  Transition Care Management Follow-up Telephone Call Date of Discharge: 04/22/24 [facility] Type of Discharge: Inpatient Admission Primary Inpatient Discharge Diagnosis:: Acute on chronic systolic CHF (congestive heart failure), NYHA class 3 How have you been since you were released from the hospital?: Better (eating, drinking well, ambulating without difficulty) Any questions or concerns?: Yes Patient Questions/Concerns:: needs contact # for referral to [facility] cardiologist  Items Reviewed: Did you receive and understand the discharge instructions provided?: Yes Medications obtained,verified, and reconciled?: Yes (Medications Reviewed) Any new allergies since your discharge?: No Dietary orders reviewed?: Yes Type of Diet Ordered:: heart healthy,  low sodium Do you have support at home?: Yes People in Home [RPT]: other relative(s) Name of Support/Comfort Primary Source: ex- wife lives with pt  Medications Reviewed Today: Medications Reviewed Today     Reviewed by Aura Mliss LABOR, RN (Registered Nurse) on 04/23/24 at 1050  Med List Status: <None>   Medication Order Taking? Sig Documenting Provider Last Dose Status Informant  acetaminophen  (TYLENOL ) 500 MG tablet [id number] Yes Take 1,000 mg by mouth every 6 (six) hours as needed for headache (pain). [provider]  Active Self, Pharmacy Records  amiodarone  (PACERONE ) 200 MG tablet [id number] Yes Take 1 tablet (200 mg total) by mouth 2 (two) times daily. Suzette Pac, MD  Active Self, Pharmacy Records  apixaban  (ELIQUIS ) 5 MG TABS tablet [id number] Yes Take 1 tablet (5 mg total) by mouth 2 (two) times daily. Okey Vina GAILS, MD  Active Self, Pharmacy Records            Med Note [place], CHUCK MATSU   Tue Feb 24, 2024  6:05 PM)    atorvastatin  (LIPITOR) 40 MG tablet [id number] Yes Take 1 tablet (40 mg total) by mouth daily. Bryn Bernardino NOVAK, MD  Active   empagliflozin (JARDIANCE) 10 MG TABS tablet [id number]  Take 1 tablet (10 mg total) by mouth daily.  Patient not taking: Reported on 04/23/2024   Bryn Bernardino NOVAK, MD  Active   hydrOXYzine  (VISTARIL ) 25 MG capsule [id number] Yes Take 1 capsule (25 mg total) by mouth every 8 (eight) hours as needed. Bryn Bernardino NOVAK, MD  Active   losartan  (COZAAR ) 25 MG tablet [id number] Yes Take 25 mg by mouth daily. [provider]  Active Self, Pharmacy Records  metoprolol  succinate (TOPROL -XL) 100 MG 24 hr tablet [id number] Yes Take 100 mg by mouth daily. [provider]  Active Self, Pharmacy Records  Multiple Vitamin (MULTIVITAMIN WITH MINERALS) TABS tablet [id number] Yes Take 1 tablet by mouth daily. [provider]  Active Self, Pharmacy Records  potassium chloride  SA (KLOR-CON  M) 20 MEQ tablet [id number] Yes Take 1 tablet (20 mEq total) by mouth daily. Bryn Bernardino NOVAK, MD  Active   torsemide (DEMADEX) 20 MG tablet [id number] Yes Take 1 tablet (20 mg total) by mouth daily. Bryn Bernardino NOVAK, MD  Active             Home Care and Equipment/Supplies: Were Home Health Services Ordered?: No Any new equipment or medical supplies ordered?: No  Functional Questionnaire: Do you need assistance with bathing/showering or dressing?: No Do you need assistance with meal preparation?: No Do you need assistance with eating?: No Do you have difficulty maintaining continence: No Do  you need assistance with getting out of bed/getting out of a chair/moving?: No Do you have difficulty managing or taking your medications?: No  Follow up appointments reviewed: PCP Follow-up appointment confirmed?: Yes Date of PCP follow-up appointment?: 04/29/24 Follow-up Provider: Jayce Cook DO Specialist [facility] Follow-up appointment  confirmed?: Yes Date of Specialist follow-up appointment?: 06/15/24 Follow-Up Specialty Provider:: [facility],  pt states he is going to start going to [facility] cardiology, referral has been placed, pt is calling today to make appointment Do you need transportation to your follow-up appointment?: No Do you understand care options if your condition(s) worsen?: Yes-patient verbalized understanding  SDOH Interventions Today    Flowsheet Row Most Recent Value  SDOH Interventions   Food Insecurity Interventions Intervention Not Indicated  Housing Interventions Intervention Not Indicated  Transportation Interventions Intervention Not Indicated  Utilities Interventions Intervention Not Indicated    Goals Addressed             This Visit's Progress    VBCI Transitions of Care (TOC) Care Plan       Problems:  Recent Hospitalization for treatment of CHF Medication access barrier pt has new medication prescribed- Jardiance- does not have on hand, states insurance denied, would like assistance of pharmacist, unsure about prior authorization since he will no longer be seeing cardiolgist at [facility] and in process of scheduling appointment with new cardiologist at [facility] Pt plans to go back to work next week, pt is purchasing a scale today and will start weighing today  Goal:  Over the next 30 days, the patient will not experience hospital readmission  Interventions:  Heart Failure Interventions: Basic overview and discussion of pathophysiology of Heart Failure reviewed Provided education on low sodium diet Assessed need for readable accurate scales in home Provided education about placing scale on hard, flat surface Advised patient to weigh each morning after emptying bladder Discussed importance of daily weight and advised patient to weigh and record daily Reviewed role of diuretics in prevention of fluid overload and management of heart failure; Discussed the importance of keeping all  appointments with provider Screening for signs and symptoms of depression related to chronic disease state  Assessed social determinant of health barriers  RN CM provided contact information for cardiology referral, pt is calling today to schedule appointment with [facility] cardiologist, will no longer be going to [facility] Referral placed for pharmacist- pt requests assistance with getting Jardiance  Patient Self Care Activities:  Attend all scheduled provider appointments Attend church or other social activities Call pharmacy for medication refills 3-7 days in advance of running out of medications Call provider office for new concerns or questions  Notify RN Care Manager of TOC call rescheduling needs Participate in Transition of Care Program/Attend TOC scheduled calls Perform all self care activities independently  Perform IADL's (shopping, preparing meals, housekeeping, managing finances) independently Take medications as prescribed   call office if I gain more than 2 pounds in one day or 5 pounds in one week keep legs up while sitting track weight in diary use salt in moderation watch for swelling in feet, ankles and legs every day weigh myself daily develop a rescue plan follow rescue plan if symptoms flare-up eat more whole grains, fruits and vegetables, lean meats and healthy fats dress right for the weather, hot or cold Pharmacist will contact you about Jardiance  Plan:  Telephone follow up appointment with care management team member scheduled for:  04/28/24 @ 415 pm The patient has been provided with contact information  for the care management team and has been advised to call with any health related questions or concerns.          Mliss Creed [facility], BSN RN Care Manager/ Transition of Care [place]/ [facility] [phone].

## 2024-04-23 NOTE — Telephone Encounter (Signed)
 Pharmacy Patient Advocate Encounter  Received notification from [employer] that Prior Authorization for Jardaince 10 mg has been APPROVED from 04/23/2024 to 07/14/2098   PA #/Case ID/Reference #: [id number]  Key: [id number]

## 2024-04-23 NOTE — Patient Instructions (Signed)
 Visit Information

## 2024-04-26 ENCOUNTER — Ambulatory Visit: Admitting: Family Medicine

## 2024-04-26 ENCOUNTER — Telehealth: Payer: Self-pay

## 2024-04-26 VITALS — BP 97/56 | Ht 74.0 in | Wt 281.4 lb

## 2024-04-26 DIAGNOSIS — F419 Anxiety disorder, unspecified: Secondary | ICD-10-CM | POA: Diagnosis not present

## 2024-04-26 DIAGNOSIS — I5022 Chronic systolic (congestive) heart failure: Secondary | ICD-10-CM | POA: Diagnosis not present

## 2024-04-26 DIAGNOSIS — I4891 Unspecified atrial fibrillation: Secondary | ICD-10-CM | POA: Diagnosis not present

## 2024-04-26 DIAGNOSIS — I10 Essential (primary) hypertension: Secondary | ICD-10-CM

## 2024-04-26 MED ORDER — HYDROXYZINE PAMOATE 25 MG PO CAPS: 25.0000 mg | ORAL_CAPSULE | Freq: Three times a day (TID) | ORAL | 3 refills | Status: AC | PRN

## 2024-04-26 NOTE — Progress Notes (Signed)
 Care Guide Pharmacy Note  04/26/2024 Name: Matthew [name] MRN: [id number] DOB: 14-May-1976  Referred By: Cook, Jayce G, DO Reason for referral: Complex Care Management (Outreach to schedule with Pharm d )   Matthew [name] is a 48 y.o. year old male who is a primary care patient of Sena, [place] was referred to the pharmacist for assistance related to: CHF  An unsuccessful telephone outreach was attempted today to contact the patient who was referred to the pharmacy team for assistance with medication assistance. Additional attempts will be made to contact the patient.  Jeoffrey Buffalo , RMA     [facility]  [facility], [facility] Guide  Direct Dial: [phone]  Website: [email or web address]

## 2024-04-26 NOTE — Progress Notes (Signed)
 Subjective:  Patient ID: Matthew [name], male    DOB: 1975/08/01  Age: 48 y.o. MRN: [id number]  CC:   Chief Complaint  Patient presents with   Hospitalization Follow-up    HPI:  48 year old male with history of transposition of the great vessels (has chronic CHF), HTN, Afib presents for hospital follow up.  Admitted from 10/6 - 10/9 for acute CHF. Initially treated with IV Lasix . Was transitioned to PO Torsemide. GDMT - Metoprolol , Losartan , Jardiance. He improved during admission and was discharged home with recommendations to follow up with me and [facility].  Improved at this time. No edema. SOB resolved. Needs form filled out regarding return to work and restrictions.   Reports anxiety. Needs refill of Hydroxyzine .  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   Atrial fibrillation (HCC) 02/03/2014   Chronic systolic CHF (congestive heart failure) (HCC) 01/01/2014   Essential hypertension 04/21/2009    Social Hx   Social History   Socioeconomic History   Marital status: Single    Spouse name: Not on file   Number of children: Not on file   Years of education: Not on file   Highest education level: Not on file  Occupational History   Not on file  Tobacco Use   Smoking status: Never   Smokeless tobacco: Current    Types: Snuff   Tobacco comments:    dipped when I played softball; none since 2011  Vaping Use   Vaping status: Every Day  Substance and Sexual Activity   Alcohol use: Yes    Alcohol/week: 60.0 standard drinks of alcohol    Types: 60 Shots of liquor per week    Comment: occassionally a few   Drug use: No   Sexual activity: Yes  Other Topics Concern   Not on file  Social History Narrative   Not on file   Social Drivers of Health   Financial Resource Strain: Not on file  Food Insecurity: No Food Insecurity (04/23/2024)    Hunger Vital Sign    Worried About Running Out of Food in the Last Year: Never true    Ran Out of Food in the Last Year: Never true  Transportation Needs: No Transportation Needs (04/23/2024)   PRAPARE - Administrator, Civil Service (Medical): No    Lack of Transportation (Non-Medical): No  Physical Activity: Not on file  Stress: Not on file  Social Connections: Not on file    Review of Systems Per HPI  Objective:  BP (!) 97/56   Ht 6' 2 (1.88 m)   Wt 281 lb 6.4 oz (127.6 kg)   BMI 36.13 kg/m      04/26/2024    3:19 PM 04/22/2024    4:55 AM 04/22/2024    4:52 AM  BP/Weight  Systolic BP 97 105   Diastolic BP 56 61   Wt. (Lbs) 281.4  288.8  BMI 36.13 kg/m2  36.58 kg/m2    Physical Exam Constitutional:      General: He is not in acute distress.    Appearance: Normal appearance.  HENT:     Head: Normocephalic and atraumatic.  Cardiovascular:     Rate and Rhythm: Normal rate and regular rhythm.  Pulmonary:     Effort: Pulmonary effort is normal.     Breath sounds: Normal breath sounds. No wheezing or rales.  Musculoskeletal:     Right lower leg: No edema.     Left lower leg: No edema.  Neurological:     Mental Status: He is alert.     Lab Results  Component Value Date   WBC 8.1 04/20/2024   HGB 14.5 04/20/2024   HCT 43.5 04/20/2024   PLT 231 04/20/2024   GLUCOSE 98 04/22/2024   CHOL 212 (H) 04/22/2024   TRIG 117 04/22/2024   HDL 29 (L) 04/22/2024   LDLDIRECT 162.4 08/10/2012   LDLCALC 160 (H) 04/22/2024   ALT 15 04/20/2024   AST 13 (L) 04/20/2024   NA 138 04/22/2024   K 3.3 (L) 04/22/2024   CL 98 04/22/2024   CREATININE 0.90 04/22/2024   BUN 14 04/22/2024   CO2 26 04/22/2024   TSH 2.210 04/19/2024   INR 2.4 12/18/2018   HGBA1C 5.1 07/29/2014     Assessment & Plan:  Chronic systolic CHF (congestive heart failure) (HCC) Assessment & Plan: Euvolemic today. Continue current medications.  Unsure why Entresto was not started. Defer  to [facility] Cardiology. BMP today.   Orders: -     Basic metabolic panel with GFR  Anxiety Assessment & Plan: Atarax  refilled.  Orders: -     hydrOXYzine  Pamoate; Take 1 capsule (25 mg total) by mouth every 8 (eight) hours as needed for anxiety.  Dispense: 90 capsule; Refill: 3  Atrial fibrillation, unspecified type [facility]) Assessment & Plan: In sinus rhythm today. Continue current medications.   Essential hypertension Assessment & Plan: Well controlled. Continue current medications.     Follow-up:  1 month  Makensey Rego DO [facility]

## 2024-04-26 NOTE — Assessment & Plan Note (Signed)
 Euvolemic today. Continue current medications.  Unsure why Entresto was not started. Defer to [facility] Cardiology. BMP today.

## 2024-04-26 NOTE — Assessment & Plan Note (Signed)
 Well controlled. Continue current medications

## 2024-04-26 NOTE — Assessment & Plan Note (Signed)
 In sinus rhythm today. Continue current medications.

## 2024-04-26 NOTE — Progress Notes (Signed)
 Care Guide Pharmacy Note  04/26/2024 Name: RASHEEM FIGIEL MRN: [id number] DOB: [birth year]  Referred By: Cook, Jayce G, DO Reason for referral: Complex Care Management (Outreach to schedule with Pharm d )   ROLLAND STEINERT is a 48 y.o. year old male who is a primary care patient of Ranjit, [place] was referred to the pharmacist for assistance related to: CHF  Successful contact was made with the patient to discuss pharmacy services.  Patient declines engagement at this time. Contact information was provided to the patient should they wish to reach out for assistance at a later time.  Jeoffrey Buffalo , RMA     [facility]  [facility], [facility] Guide  Direct Dial: [phone]  Website: [email or web address]

## 2024-04-26 NOTE — Patient Instructions (Signed)
 Lab today.  Follow up in 1 month.  Take care  Dr. Bluford

## 2024-04-26 NOTE — Assessment & Plan Note (Signed)
Atarax refilled

## 2024-04-27 ENCOUNTER — Ambulatory Visit: Payer: Self-pay | Admitting: Family Medicine

## 2024-04-27 LAB — BASIC METABOLIC PANEL WITH GFR
BUN/Creatinine Ratio: 17 (ref 9–20)
BUN: 18 mg/dL (ref 6–24)
CO2: 23 mmol/L (ref 20–29)
Calcium: 10.1 mg/dL (ref 8.7–10.2)
Chloride: 96 mmol/L (ref 96–106)
Creatinine, Ser: 1.04 mg/dL (ref 0.76–1.27)
Glucose: 107 mg/dL — ABNORMAL HIGH (ref 70–99)
Potassium: 4.6 mmol/L (ref 3.5–5.2)
Sodium: 137 mmol/L (ref 134–144)
eGFR: 90 mL/min/1.73 (ref >59)

## 2024-04-28 ENCOUNTER — Encounter: Payer: Self-pay | Admitting: *Deleted

## 2024-04-28 ENCOUNTER — Telehealth: Payer: Self-pay | Admitting: *Deleted

## 2024-04-29 ENCOUNTER — Inpatient Hospital Stay: Admitting: Family Medicine

## 2024-04-29 ENCOUNTER — Telehealth: Payer: Self-pay | Admitting: *Deleted

## 2024-04-29 NOTE — Patient Instructions (Signed)
 Visit Information  Thank you for taking time to visit with me today. Please don't hesitate to contact me if I can be of assistance to you before our next scheduled telephone appointment.  Following are the goals we discussed today:   Goals Addressed             This Visit's Progress    VBCI Transitions of Care (TOC) Care Plan       Problems:  Recent Hospitalization for treatment of CHF Medication access barrier pt has new medication prescribed- Jardiance- does not have on hand, states insurance denied, would like assistance of pharmacist, unsure about prior authorization since he will no longer be seeing cardiolgist at [facility] and in process of scheduling appointment with new cardiologist at [facility] 04/29/24- pt reports he is doing well, has not gone back to work yet due to 10 pound limit on lifting, pt is now weighing daily, pt now has Jardiance on hand, pt scheduled appointment with [facility] cardiologist  Goal:  Over the next 30 days, the patient will not experience hospital readmission  Interventions:  Heart Failure Interventions: Provided education about placing scale on hard, flat surface Advised patient to weigh each morning after emptying bladder Discussed importance of daily weight and advised patient to weigh and record daily Reviewed role of diuretics in prevention of fluid overload and management of heart failure; Discussed the importance of keeping all appointments with provider Reinforced HF action plan Reviewed heart healthy diet  Patient Self Care Activities:  Attend all scheduled provider appointments Attend church or other social activities Call pharmacy for medication refills 3-7 days in advance of running out of medications Call provider office for new concerns or questions  Notify RN Care Manager of TOC call rescheduling needs Participate in Transition of Care Program/Attend TOC scheduled calls Perform all self care activities independently  Perform IADL's (shopping,  preparing meals, housekeeping, managing finances) independently Take medications as prescribed   call office if I gain more than 2 pounds in one day or 5 pounds in one week keep legs up while sitting track weight in diary use salt in moderation watch for swelling in feet, ankles and legs every day weigh myself daily develop a rescue plan follow rescue plan if symptoms flare-up eat more whole grains, fruits and vegetables, lean meats and healthy fats dress right for the weather, hot or cold  Plan:  Telephone follow up appointment with care management team member scheduled for:  05/07/24 @ 1030 am The patient has been provided with contact information for the care management team and has been advised to call with any health related questions or concerns.          Our next appointment is by telephone on 05/07/24 @ 1030 am  Please call the care guide team at [phone] if you need to cancel or reschedule your appointment.   If you are experiencing a Mental Health or Behavioral Health Crisis or need someone to talk to, please call the Suicide and Crisis Lifeline: 988 call the [place]  National Suicide Prevention Lifeline: 725-199-9456 or TTY: 646-522-7544 TTY (213) 037-0864) to talk to a trained counselor call 1-800-273-TALK (toll free, 24 hour hotline) go to [facility] [address], [place] [phone]) call the [facility] Crisis Line: [phone] call 911   Patient verbalizes understanding of instructions and care plan provided today and agrees to view in MyChart. Active MyChart status and patient understanding of how to access instructions and care plan via MyChart confirmed  with patient.     Telephone follow up appointment with care management team member scheduled for:  05/07/24 @ 1030 am  Mliss Creed [facility], BSN RN Care Manager/ Transition of Care [place]/ [facility] [phone]

## 2024-04-29 NOTE — Patient Outreach (Signed)
 Transition of Care week 2  Visit Note  04/29/2024  Name: Matthew [name] MRN: [id number]          DOB: January 25, 1976  Situation: Patient enrolled in [facility] 30-day program. Visit completed with patient by telephone.   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    Assessment: Patient Reported Symptoms: Cognitive Cognitive Status: No symptoms reported, Alert and oriented to person, place, and time, Normal speech and language skills, Able to follow simple commands      Neurological Neurological Review of Symptoms: No symptoms reported    HEENT HEENT Symptoms Reported: No symptoms reported      Cardiovascular Cardiovascular Symptoms Reported: No symptoms reported Does patient have uncontrolled Hypertension?: No Cardiovascular Management Strategies: Medication therapy, Routine screening Weight: 277 lb (125.6 kg) Cardiovascular Self-Management Outcome: 4 (good) Cardiovascular Comment: pt is not weighing daily  Respiratory Respiratory Symptoms Reported: No symptoms reported    Endocrine Endocrine Symptoms Reported: No symptoms reported Is patient diabetic?: No    Gastrointestinal Gastrointestinal Symptoms Reported: No symptoms reported      Genitourinary Genitourinary Symptoms Reported: No symptoms reported    Integumentary Integumentary Symptoms Reported: No symptoms reported    Musculoskeletal Musculoskelatal  Symptoms Reviewed: No symptoms reported        Psychosocial Psychosocial Symptoms Reported: No symptoms reported         There were no vitals filed for this visit.  Medications Reviewed Today     Reviewed by Aura Mliss LABOR, RN (Registered Nurse) on 04/29/24 at 1017  Med List Status: <None>   Medication Order Taking? Sig Documenting Provider Last Dose Status Informant  acetaminophen  (TYLENOL ) 500 MG tablet [id number]  Take 1,000 mg by mouth every 6 (six) hours as needed for headache (pain). [provider]  Active Self, Pharmacy Records  amiodarone  (PACERONE ) 200 MG tablet [id number]  Take 1 tablet (200 mg total) by mouth 2 (two) times daily. Suzette Pac, MD  Active Self, Pharmacy Records  apixaban  (ELIQUIS ) 5 MG TABS tablet [id number]  Take 1 tablet (5 mg total) by mouth 2 (two) times daily. Okey Vina GAILS, MD  Active Self, Pharmacy Records           Med Note Huey, ANGELICA G   Tue Feb 24, 2024  6:05 PM)    atorvastatin  (LIPITOR) 40 MG tablet [id number]  Take 1 tablet (40 mg total) by mouth daily. Bryn Bernardino NOVAK, MD  Active   empagliflozin (JARDIANCE) 10 MG TABS tablet [id number]  Take 1 tablet (10 mg total) by mouth daily.  Patient not taking: Reported on 04/23/2024   Bryn Bernardino NOVAK, MD  Active   hydrOXYzine  (VISTARIL ) 25 MG capsule [id number]  Take 1 capsule (25 mg total) by mouth every 8 (eight) hours as needed for anxiety. Cook, Jayce G, DO  Active   losartan  (COZAAR ) 25 MG  tablet [id number]  Take 25 mg by mouth daily. [provider]  Active Self, Pharmacy Records  metoprolol  succinate (TOPROL -XL) 100 MG 24 hr tablet [id number]  Take 100 mg by mouth daily. [provider]  Active Self, Pharmacy Records  Multiple Vitamin (MULTIVITAMIN WITH MINERALS) TABS tablet [id number]  Take 1 tablet by mouth daily. [provider]  Active Self, Pharmacy Records  potassium chloride  SA (KLOR-CON  M) 20 MEQ tablet [id number]  Take 1 tablet (20 mEq total) by mouth daily.  Bryn Bernardino NOVAK, MD  Active   torsemide (DEMADEX) 20 MG tablet [id number]  Take 1 tablet (20 mg total) by mouth daily. Bryn Bernardino NOVAK, MD  Active             Goals Addressed             This Visit's Progress    [facility] Transitions of Care (TOC) Care Plan       Problems:  Recent Hospitalization for treatment of CHF Medication access barrier pt has new medication prescribed- Jardiance- does not have on hand, states insurance denied, would like assistance of pharmacist, unsure about prior authorization since he will no longer be seeing cardiolgist at [facility] and in process of scheduling appointment with new cardiologist at [facility] 04/29/24- pt reports he is doing well, has not gone back to work yet due to 10 pound limit on lifting, pt is now weighing daily, pt now has Jardiance on hand, pt scheduled appointment with [facility] cardiologist  Goal:  Over the next 30 days, the patient will not experience hospital readmission  Interventions:  Heart Failure Interventions: Provided education about placing scale on hard, flat surface Advised patient to weigh each morning after emptying bladder Discussed importance of daily weight and advised patient to weigh and record daily Reviewed role of diuretics in prevention of fluid overload and management of heart failure; Discussed the importance of keeping all appointments with provider Reinforced HF action plan Reviewed heart healthy diet  Patient Self Care Activities:  Attend all scheduled provider appointments Attend church or other social activities Call pharmacy for medication refills 3-7 days in advance of running out of medications Call provider office for new concerns or questions  Notify RN Care Manager of TOC call rescheduling needs Participate in Transition of Care Program/Attend TOC scheduled calls Perform all self care activities independently  Perform IADL's (shopping, preparing meals, housekeeping, managing finances) independently Take  medications as prescribed   call office if I gain more than 2 pounds in one day or 5 pounds in one week keep legs up while sitting track weight in diary use salt in moderation watch for swelling in feet, ankles and legs every day weigh myself daily develop a rescue plan follow rescue plan if symptoms flare-up eat more whole grains, fruits and vegetables, lean meats and healthy fats dress right for the weather, hot or cold  Plan:  Telephone follow up appointment with care management team member scheduled for:  05/07/24 @ 1030 am The patient has been provided with contact information for the care management team and has been advised to call with any health related questions or concerns.         Recommendation:   PCP Follow-up Specialty provider follow-up cardiologist  Follow Up Plan:   Telephone follow-up 05/07/24 @ 1030 am  Mliss Creed [facility], BSN RN Care Manager/ Transition of Care [place]/ [facility] [phone]

## 2024-05-07 ENCOUNTER — Encounter: Payer: Self-pay | Admitting: *Deleted

## 2024-05-07 ENCOUNTER — Telehealth: Payer: Self-pay | Admitting: *Deleted

## 2024-05-10 ENCOUNTER — Encounter: Payer: Self-pay | Admitting: *Deleted

## 2024-05-21 ENCOUNTER — Other Ambulatory Visit: Payer: Self-pay | Admitting: Family Medicine

## 2024-05-21 NOTE — Telephone Encounter (Signed)
 Copied from CRM #[id number]. Topic: Clinical - Medication Refill >> May 21, 2024 12:11 PM Roselie C wrote: Medication: atorvastatin  (LIPITOR) 40 MG tablet empagliflozin (JARDIANCE) 10 MG TABS tablet losartan  (COZAAR ) 25 MG tablet potassium chloride  SA (KLOR-CON  M) 20 MEQ tablet torsemide (DEMADEX) 20 MG tablet  Urgent will be out of blood pressure meds and fluid pill.its for lungs.    has the patient contacted their pharmacy? Yes (Agent: If no, request that the patient contact the pharmacy for the refill. If patient does not wish to contact the pharmacy document the reason why and proceed with request.) (Agent: If yes, when and what did the pharmacy advise?)  This is the patient's preferred pharmacy:  [facility] [address] [place] - [address] [place] [address] Phone: [phone] Fax: [phone]  Is this the correct pharmacy for this prescription? Yes If no, delete pharmacy and type the correct one.   Has the prescription been filled recently? Yes  Is the patient out of the medication? Yes  Has the patient been seen for an appointment in the last year OR does the patient have an upcoming appointment? Yes  Can we respond through MyChart? Yes  Agent: Please be advised that Rx refills may take up to 3 business days. We ask that you follow-up with your pharmacy.

## 2024-05-24 ENCOUNTER — Other Ambulatory Visit: Payer: Self-pay

## 2024-05-24 ENCOUNTER — Telehealth: Payer: Self-pay | Admitting: Family Medicine

## 2024-05-24 ENCOUNTER — Encounter: Payer: Self-pay | Admitting: Family Medicine

## 2024-05-24 ENCOUNTER — Other Ambulatory Visit: Payer: Self-pay | Admitting: Nurse Practitioner

## 2024-05-24 MED ORDER — POTASSIUM CHLORIDE CRYS ER 20 MEQ PO TBCR: 20.0000 meq | EXTENDED_RELEASE_TABLET | Freq: Every day | ORAL | 0 refills | Status: DC

## 2024-05-24 MED ORDER — TORSEMIDE 20 MG PO TABS: 20.0000 mg | ORAL_TABLET | Freq: Every day | ORAL | 0 refills | Status: DC

## 2024-05-24 MED ORDER — ATORVASTATIN CALCIUM 40 MG PO TABS: 40.0000 mg | ORAL_TABLET | Freq: Every day | ORAL | 2 refills | Status: DC

## 2024-05-24 MED ORDER — LOSARTAN POTASSIUM 25 MG PO TABS: 25.0000 mg | ORAL_TABLET | Freq: Every day | ORAL | 2 refills | Status: DC

## 2024-05-24 MED ORDER — EMPAGLIFLOZIN 10 MG PO TABS: 10.0000 mg | ORAL_TABLET | Freq: Every day | ORAL | 2 refills | Status: DC

## 2024-05-24 NOTE — Telephone Encounter (Signed)
 Copied from CRM #[id number]. Topic: Clinical - Medication Refill >> May 21, 2024 12:11 PM Roselie C wrote: Medication: atorvastatin  (LIPITOR) 40 MG tablet empagliflozin (JARDIANCE) 10 MG TABS tablet losartan  (COZAAR ) 25 MG tablet potassium chloride  SA (KLOR-CON  M) 20 MEQ tablet torsemide (DEMADEX) 20 MG tablet  Urgent will be out of blood pressure meds and fluid pill.its for lungs.    has the patient contacted their pharmacy? Yes (Agent: If no, request that the patient contact the pharmacy for the refill. If patient does not wish to contact the pharmacy document the reason why and proceed with request.) (Agent: If yes, when and what did the pharmacy advise?)  This is the patient's preferred pharmacy:  [facility] [address], [place] - [address] [place] [address] Phone: [phone] Fax: [phone]  Is this the correct pharmacy for this prescription? Yes If no, delete pharmacy and type the correct one.   Has the prescription been filled recently? Yes  Is the patient out of the medication? Yes  Has the patient been seen for an appointment in the last year OR does the patient have an upcoming appointment? Yes  Can we respond through MyChart? Yes  Agent: Please be advised that Rx refills may take up to 3 business days. We ask that you follow-up with your pharmacy. >> May 24, 2024  2:28 PM Mia F wrote: Pt called to check the status of his medication. Pt was reminded that it takes up to 3 business days for medication refill requests. Pt says he is out of his medications and he really need the fluid and bp meds the most. Please advise.

## 2024-05-25 NOTE — Telephone Encounter (Signed)
 Matthew Elveria BROCKS, NP     05/24/24  4:40 PM These have already been sent in by Dr. Bluford.

## 2024-05-25 NOTE — Telephone Encounter (Signed)
 Patient notified via mychart

## 2024-05-27 ENCOUNTER — Ambulatory Visit: Admitting: Family Medicine

## 2024-05-27 VITALS — BP 112/72 | HR 64 | Temp 97.7°F | Ht 74.0 in | Wt 283.1 lb

## 2024-05-27 DIAGNOSIS — E785 Hyperlipidemia, unspecified: Secondary | ICD-10-CM | POA: Diagnosis not present

## 2024-05-27 DIAGNOSIS — I4891 Unspecified atrial fibrillation: Secondary | ICD-10-CM | POA: Diagnosis not present

## 2024-05-27 DIAGNOSIS — I5022 Chronic systolic (congestive) heart failure: Secondary | ICD-10-CM

## 2024-05-27 MED ORDER — LOSARTAN POTASSIUM 25 MG PO TABS: 25.0000 mg | ORAL_TABLET | Freq: Every day | ORAL | 3 refills | Status: AC

## 2024-05-27 MED ORDER — POTASSIUM CHLORIDE CRYS ER 20 MEQ PO TBCR: 20.0000 meq | EXTENDED_RELEASE_TABLET | Freq: Every day | ORAL | 3 refills | Status: AC

## 2024-05-27 MED ORDER — ATORVASTATIN CALCIUM 40 MG PO TABS: 40.0000 mg | ORAL_TABLET | Freq: Every day | ORAL | 3 refills | Status: AC

## 2024-05-27 MED ORDER — TORSEMIDE 20 MG PO TABS: 20.0000 mg | ORAL_TABLET | Freq: Every day | ORAL | 3 refills | Status: AC

## 2024-05-27 NOTE — Assessment & Plan Note (Signed)
 Stable.  Continue current medications.

## 2024-05-27 NOTE — Assessment & Plan Note (Signed)
Tolerating statin. Continue. 

## 2024-05-27 NOTE — Patient Instructions (Signed)
 Glad you're doing well.  Follow up in 6 months.

## 2024-05-27 NOTE — Progress Notes (Signed)
 Subjective:  Patient ID: Matthew [name], male    DOB: [birth year]  Age: 48 y.o. MRN: [id number]  CC:   Chief Complaint  Patient presents with   Follow-up    Patient is here for a 1 month follow up. He states he is feeling better than he ever has.     HPI:  48 year old male with the below mentioned medical problems presents for follow-up.  Patient states that since his hospitalization he is feeling well.  He has changed jobs and his anxiety is better.  No chest pain or shortness of breath.  Blood pressure is well-controlled.  He has upcoming follow-up with cardiology both at [facility] and at [facility].  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   Atrial fibrillation (HCC) 02/03/2014   Chronic systolic CHF (congestive heart failure) (HCC) 01/01/2014   Essential hypertension 04/21/2009    Social Hx   Social History   Socioeconomic History   Marital status: Single    Spouse name: Not on file   Number of children: Not on file   Years of education: Not on file   Highest education level: Not on file  Occupational History   Not on file  Tobacco Use   Smoking status: Never   Smokeless tobacco: Current    Types: Snuff   Tobacco comments:    dipped when I played softball; none since 2011  Vaping Use   Vaping status: Every Day  Substance and Sexual Activity   Alcohol use: Yes    Alcohol/week: 60.0 standard drinks of alcohol    Types: 60 Shots of liquor per week    Comment: occassionally a few   Drug use: No   Sexual activity: Yes  Other Topics Concern   Not on file  Social History Narrative   Not on file   Social Drivers of Health   Financial Resource Strain: Not on file  Food Insecurity: No Food Insecurity (04/23/2024)   Hunger Vital Sign    Worried About Running Out of Food in the Last Year: Never true    Ran Out of Food in the Last Year: Never true   Transportation Needs: No Transportation Needs (04/23/2024)   PRAPARE - Administrator, Civil Service (Medical): No    Lack of Transportation (Non-Medical): No  Physical Activity: Not on file  Stress: Not on file  Social Connections: Not on file    Review of Systems Per HPI  Objective:  BP 112/72 (BP Location: Left Arm, Patient Position: Sitting)   Pulse 64   Temp 97.7 F (36.5 C)   Ht 6' 2 (1.88 m)   Wt 283 lb 2 oz (128.4 kg)   BMI 36.35 kg/m      05/27/2024    8:08 AM 04/29/2024   10:18 AM 04/26/2024    3:19 PM  BP/Weight  Systolic BP 112  97  Diastolic BP 72  56  Wt. (Lbs) 283.13 277 281.4  BMI 36.35 kg/m2 35.56 kg/m2 36.13 kg/m2    Physical Exam Vitals and nursing note reviewed.  Constitutional:      Appearance: Normal appearance. He is obese.  HENT:     Head: Normocephalic and atraumatic.  Cardiovascular:     Rate and Rhythm: Normal rate and regular rhythm.  Pulmonary:     Effort: Pulmonary effort is normal.     Breath sounds: Normal breath  sounds. No wheezing or rales.  Neurological:     Mental Status: He is alert.  Psychiatric:        Mood and Affect: Mood normal.        Behavior: Behavior normal.     Lab Results  Component Value Date   WBC 8.1 04/20/2024   HGB 14.5 04/20/2024   HCT 43.5 04/20/2024   PLT 231 04/20/2024   GLUCOSE 107 (H) 04/26/2024   CHOL 212 (H) 04/22/2024   TRIG 117 04/22/2024   HDL 29 (L) 04/22/2024   LDLDIRECT 162.4 08/10/2012   LDLCALC 160 (H) 04/22/2024   ALT 15 04/20/2024   AST 13 (L) 04/20/2024   NA 137 04/26/2024   K 4.6 04/26/2024   CL 96 04/26/2024   CREATININE 1.04 04/26/2024   BUN 18 04/26/2024   CO2 23 04/26/2024   TSH 2.210 04/19/2024   INR 2.4 12/18/2018   HGBA1C 5.1 07/29/2014     Assessment & Plan:  Chronic systolic CHF (congestive heart failure) (HCC) Assessment & Plan: Stable currently.  Advised patient to discuss the possibility of Entresto with cardiology.  Orders: -      Losartan  Potassium; Take 1 tablet (25 mg total) by mouth daily.  Dispense: 90 tablet; Refill: 3 -     Potassium Chloride  Crys ER; Take 1 tablet (20 mEq total) by mouth daily.  Dispense: 90 tablet; Refill: 3 -     Torsemide; Take 1 tablet (20 mg total) by mouth daily.  Dispense: 90 tablet; Refill: 3  Atrial fibrillation, unspecified type (HCC) Assessment & Plan: Stable.  Continue current medications.   Dyslipidemia Assessment & Plan: Tolerating statin.  Continue.  Orders: -     Atorvastatin  Calcium ; Take 1 tablet (40 mg total) by mouth daily.  Dispense: 90 tablet; Refill: 3    Follow-up:  6 months  Damien Cisar Bluford DO [facility]

## 2024-05-27 NOTE — Assessment & Plan Note (Signed)
 Stable currently.  Advised patient to discuss the possibility of Entresto with cardiology.

## 2024-05-28 NOTE — Progress Notes (Deleted)
 Cardiology Office Note:    Date:  05/28/2024   ID:  Matthew [name], DOB 1975-09-02, MRN [id number]  PCP:  Bluford Jacqulyn MATSU, DO   [facility] Providers Cardiologist:  Vina Gull, MD { Click to update primary MD,subspecialty MD or APP then REFRESH:1}    Referring MD: Suzette Pac, MD   No chief complaint on file. ***  History of Present Illness:    Matthew [name] is a 48 y.o. male seen at the request of Dr Bluford for evaluation of SVT. 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 He is s/p Mustard procedure in 1980. He has been followed at Congenital heart disease clinic at [facility]. He was born with transposition of the great vessels and underwent surgery at ~12 hours of life. He underwent a palliative septostomy as a neonate. He then had another surgery at 34 months of age to make the hole bigger. At 66 months old, he underwent a Mustard procedure at [facility]. Around 2015, he began to have worsening difficulties with palpitations and was diagnosed with both atrial fibrillation and atypical atrial flutter. He was placed on beta blocker and anticoagulated. In 2019 he had symptomatic tachycardia with HR up to 200 and had DCCV. When seen in 06/2018, he was changed  from Metoprolol  25 BID to 100XL, decreased the losartan  to 25, and ordered a CPX and cardiac MRI. The MRI revealed mild narrowing of the superior venous baffle (no other baffle leaks or stenosis), mild to moderate TR, no MR, systemic (RV) EF 61%, venous (LV) EF 62%, and possible right sided LAD. He was seen in our ED in 2020 with atypical atrial flutter and again had cardioversion. He was referred to EP for consideration for an ablation. He was evaluated by Dr. Pierrette on 04/13/2020 and was offered an EPS-RFA, but decided to defer. He was followed up on 05/11/2020 and opted to continue with metoprolol  100qday and apixaban  (Eliquis ) 5q12h without additional changes and  without scheduling a procedure date. His 14 day Holter monitor demonstrated 18 occurrences of Supraventricular Tachycardia with the longest episode 11 beats. He had recurrent SVT in Jan 2024 with DCCV.  Planned cardiac MRI in May 2025 aborted due to inability to tolerate with claustrophobia.   Patient has been seen in ED on multiple occasions for SVT including 01/01/2024, 02/24/2024, 04/14/2024 s/p DCCV on all occurrences. Last ED visit was discharged on amiodarone  200 mg BID.   He was admitted 10/6-10/9/25 with CHF. Patient reported symptoms of  shortness of breath for last 3 days, unable to lie flat, has been using 2 pillows to sleep. He is found to have proBNP 1007, troponin 15 x 3, chest x-ray shows cardiomegaly with increase vascular engorgement with interstitial edema of lung fields. He was diuresed and sent home on Torsemide. Echo as noted below.    Past Medical History:  Diagnosis Date   A-fib [facility])    Alcohol abuse    Anxiety    Atypical atrial flutter (HCC)    Complete transposition of great vessels    Depression    Dyslipidemia    GERD (gastroesophageal reflux disease)    Headache    weekly (09/24/2014)   Heart murmur    Hypertension    [facility] spotted fever 11/2013   SVT (supraventricular tachycardia) several times in the last year (09/24/2014)   Transposition of great vessel    s/p Mustard procedure in 1980    Past Surgical  History:  Procedure Laterality Date   FINGER ARTHROPLASTY Right    volar plate arthroplasty, right small finger proxima linterphalangeal joint [Other]   FRACTURE SURGERY     SEPTOSTOMY     Rashkind balloon atrial septostomy    TRANSPOSITION OF GREAT VESSELS REPAIR  1980   mustard procedure    Current Medications: No outpatient medications have been marked as taking for the 06/03/24 encounter (Appointment) with Adrine Hayworth M, MD.     Allergies:   Ace inhibitors and Aspirin   Social History   Socioeconomic History   Marital  status: Single    Spouse name: Not on file   Number of children: Not on file   Years of education: Not on file   Highest education level: Not on file  Occupational History   Not on file  Tobacco Use   Smoking status: Never   Smokeless tobacco: Current    Types: Snuff   Tobacco comments:    dipped when I played softball; none since 2011  Vaping Use   Vaping status: Every Day  Substance and Sexual Activity   Alcohol use: Yes    Alcohol/week: 60.0 standard drinks of alcohol    Types: 60 Shots of liquor per week    Comment: occassionally a few   Drug use: No   Sexual activity: Yes  Other Topics Concern   Not on file  Social History Narrative   Not on file   Social Drivers of Health   Financial Resource Strain: Not on file  Food Insecurity: No Food Insecurity (04/23/2024)   Hunger Vital Sign    Worried About Running Out of Food in the Last Year: Never true    Ran Out of Food in the Last Year: Never true  Transportation Needs: No Transportation Needs (04/23/2024)   PRAPARE - Administrator, Civil Service (Medical): No    Lack of Transportation (Non-Medical): No  Physical Activity: Not on file  Stress: Not on file  Social Connections: Not on file     Family History: The patient's ***family history includes Hypertension in his father; Rheumatic fever in his father.  ROS:   Please see the history of present illness.    *** All other systems reviewed and are negative.  EKGs/Labs/Other Studies Reviewed:    The following studies were reviewed today: Cardiac MRI 6/211/22:     FINAL IMPRESSION   ==========================================================================================================   - Complete transposition of the great arteries s/p Mustard operation (atrial switch)  - Mild to moderate tricuspid valve regurgitation  - Mild narrowing of the superior systemic venous limb baffle without any significant obstruction  - Mild central aortic  valve regurgitation  - Tortuous aortic arch, most consistent with pseudocoarctation  - Moderate to severe right ventricular hypertrophy with non-specific hyperenhancement  - Mildly dilated right ventricle with normal biventricular systolic function   Compared to the prior scan, there has been a mild increase in right ventricular volumes, but no other  significant change.   SUMMARY   ==========================================================================================================   Cardiac MRI and chest MRA were performed with and without contrast and velocity encoded imaging to evaluate  cardiac morphology, function, viability, and the thoracic vasculature in a 48 year old male with  D-transposition of the great arteries s/p Mustard procedure.  Scan was compared to the prior scan performed on 04/29/2018   REPAIR: Atrial switch (Senning, Mustard).   POSITION: Levocardia.   VISCERAL SITUS: The visceral situs is situs solitus.   ATRIAL SITUS: The  atrial situs is situs solitus (S).   VENTRICULAR LOOPING: There is D-ventricular looping (D).   GREAT ARTERY ORIENTATION: There is D-transposition of the great arteries (D).   SYSTEMIC VENOUS ANATOMY: The right SVC and right IVC have been baffled to the mitral valve consistent with a Mustard procedure. The  proximal SVC is narrowed as it courses between the limbs of the pulmonary venous baffle to a minimum  diameter of 1.4 x 0.8 cm (previously 1.6. x 1 cm).  There is no evidence of significant SVC obstruction or  retrograde flow on velocity encoded imaging.  The IVC pathway is widely patent.   A normal right-sided  azygous vein is present.   PULMONARY VENOUS ANATOMY: The pulmonary veins have been baffled to the tricuspid valve. There is no stenosis of individual veins or  the pulmonary venous baffle.   RIGHT ATRIUM: RA is moderately enlarged. There is no RA mass/thrombus.   LEFT ATRIUM: LA is mildly enlarged. There is no LA  mass/thrombus.   LA/RA SEPTUM: There is no evidence of baffle leaks but smaller leaks cannot be completely ruled out.   TRICUSPID VALVE: Tricuspid valve leaflets are normal. There is no tricuspid stenosis. There is mild-moderate tricuspid  regurgitation.   MITRAL VALVE: Mitral valve leaflets are normal. There is no mitral stenosis. There is no mitral regurgitation.   RIGHT VENTRICLE: There is moderate to severe RVH. RV systolic function is normal. RV is mildly enlarged. There is no RV  mass/thrombus. Quantitative RVEF 56% (prior 61%).   LEFT VENTRICLE: LV cavity size is normal. Quantitative LVEF 62% (prior 62%). LV wall thickness is normal. LV systolic  function is normal.   LV/RV SEPTUM: The ventricular septum is intact, although thinned in the mid septal segments. The ventricular septum  flattens in systole and diastole consistent with right ventricular pressure and volume overload.   CONOTRUNCAL ANATOMY: The aortic valve is anterior and rightward of the pulmonary valve consistent with D-transposition of the  great arteries.   OUTFLOW TRACT: No right or left ventricular outflow tract obstruction.   PULMONIC VALVE: Pulmonic valve leaflets are normal. There is no pulmonic stenosis. There is no pulmonic regurgitation.   AORTIC VALVE: Aortic valve is trileaflet. Aortic valve is mildly thickened.  There is no aortic stenosis. There is mild  central aortic regurgitation.   PULMONARY ARTERIES: The main pulmonary artery is normal in size. The right pulmonary artery is normal in size. The left  pulmonary artery is normal in size.  Specific pulmonary artery measurements are:  MPA: 3.6 x 2.7 cm  RPA: 2.1 x 1.9 cm  LPA: 2.4 x 1.6 cm   AORTIC ARCH: The aortic arch is left sided. The aortic arch branching is normal.  The aortic arch is tortuous,  consistent with a pseudocoarctation.  There is no patent ductus arteriosus.  Specific aortic measurements  are:  Sinuses of Valsalva: 4.1 x 4.0 cm  (prior 3.8 x 3.5 cm)  Sinotubular junction: 2.9 x 2.9 cm (prior 2.9 x 2.8 cm)  Ascending aorta (level of PAs): 2.8 x 2.7 cm (prior 2.9 x 2.6 cm)  Ascending aorta (prior to innominate artery): 2.5 x 2.4 cm (prior 2.6 x 2.5 cm)  Proximal transverse arch: 2.4 x 2.4 cm (prior 2.5 x 2.2 cm)  Distal transverse arch: 2.0 x 2.0 cm (prior 2.0 x 1.9 cm)  Isthmus: 2.7 x 2.7 cm (prior 2.8 x 2.6 cm)  Descending aorta (level of PAs): 3.8 x 3.4 cm (prior 4.0 x 3.5 cm)  Descending aorta (diaphragm): 2.5 x 2.4 cm (prior 2.5 x 2.4 cm)   PERICARDIUM: Pericardium is normal. There is no pericardial effusion.   PLEURAL EFFUSION: There is no pleural effusion.   VIABILITY: Hyperenhancement is abnormal.  There is non-specific hyperenhancement of the right ventricle. There is  hyperenhancement of the right atrial wall. There is no evidence of infarction, thrombus or infiltrative  disease.   AORTIC ROOT: The aortic root is mildly dilated.   CORONARIES: Normal coronary artery origins and proximal courses for D-transposition.   Echo 07/01/23: ECHOCARDIOGRAPHIC DESCRIPTION ------------------------------------------------     The Anatomy is that of D-TGA S/P Mustard Procedure.     CARDIAC POSITION    Levocardia.  Atrial situs solitus.     VEINS    IVC connection to RA, with  normal size and normal respiratory collapse     ATRIA    s/p mustard procedure; pulmonary venous baffle is patent.     ATRIOVENTRICULAR VALVES    Normal TV with moderate systemic AV valve regurgitation, normal systemic    pressures. Trivial pulmonary circulation AV (mitral valve) regurgitation     VENTRICLES    Moderately enlarged RV which is the systemic ventricle. The RV function is    moderately globally reduced (EF 40%). Normal LV size which is the    pulmonary ventricle. The LV function is grossly preserved (EF 50%)     SEMILUNAR VALVES    The Aortic valve is trileaflet with Mild Regurgitation. The Aortic Valve    arises  anteriorly from the Systemic Right Ventricle. The Pulmonary Valve    arises Posteriorly from the Pulmonary Ventricle(LV) with Trivial    Regurgitation.     CORONARY ARTERIES    Not seen.     GREAT ARTERIES    The Ascending aorta arises Anteriorly from the Systemic Right Ventricle,    and is Normal in size(2.6 cm).The Main Pulmonary Artery Arises Posteriorly    From the Pulmonary Ventricle(LV), and is Normal in size(2.2cm)   SPECIAL PROCEDURES -----------------------------------------------------------    QUANTITATIVE DATA ------------------------------------------------------------     LVIDd:  5.0 cm            PWT: 1.0 cm          LA Diam:  3.2 cm    LVIDs:  3.5 cm            SWT: 1.3 cm       FS:  0.30    INTERPRETATION ---------------------------------------------------------------     Compared to prior echo on 06/21/2020: No Significant Changes.   ECHO IMPRESSIONS 04/20/2024  1. 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   2. Overall does not appear to be signficantly changed from echo done at  [facility] in Dec 2024.   3. Poor acoustic windows limit study.   4. Anatomic LV (pulmonic ventricle) is small. There is septal flattening  Overall systolic function normal (55%). Left ventricular diastolic  parameters are indeterminate.   5. RV (systemic ventricle) is moderately dilated and ventricular function  is moderately reduced RVEF approximately 40%. Severely increased right  ventricular wall thickness.   6. S/p Mustard Unable see flow through baffles.   7. Small to moderate.   8. The mitral valve is normal in structure. Trivial mitral valve  regurgitation.   9. Tricuspid valve regurgitation is moderate.  10. 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 Trileaflet Mild  AI. The aortic valve is tricuspid. Aortic valve regurgitation is mild.  11. Pulmonic valve  arises from anatomic LV, posterior to AV Difficult to  see leaflets well Trace PI.    Recent Labs: 08/05/2023: B Natriuretic Peptide 173.0 04/19/2024: Pro Brain Natriuretic Peptide 1,007.0; TSH 2.210 04/20/2024: ALT 15; Hemoglobin 14.5; Magnesium  2.2; Platelets 231 04/26/2024: BUN 18; Creatinine, Ser 1.04; Potassium 4.6; Sodium 137  Recent Lipid Panel    Component Value Date/Time   CHOL 212 (H) 04/22/2024 0516   TRIG 117 04/22/2024 0516   HDL 29 (L) 04/22/2024 0516   CHOLHDL 7.4 04/22/2024 0516   VLDL 23 04/22/2024 0516   LDLCALC 160 (H) 04/22/2024 0516   LDLDIRECT 162.4 08/10/2012 1143     Risk Assessment/Calculations:   {Does this patient have ATRIAL FIBRILLATION?:[phone]}            Physical Exam:    VS:  There were no vitals taken for this visit.    Wt Readings from Last 3 Encounters:  05/27/24 283 lb 2 oz (128.4 kg)  04/29/24 277 lb (125.6 kg)  04/26/24 281 lb 6.4 oz (127.6 kg)     GEN: *** Well nourished, well developed in no acute distress HEENT: Normal NECK: No JVD; No carotid bruits LYMPHATICS: No lymphadenopathy CARDIAC: ***RRR, no murmurs, rubs, gallops RESPIRATORY:  Clear to auscultation without rales, wheezing or rhonchi  ABDOMEN: Soft, non-tender, non-distended MUSCULOSKELETAL:  No edema; No deformity  SKIN: Warm and dry NEUROLOGIC:  Alert and oriented x 3 PSYCHIATRIC:  Normal affect   ASSESSMENT:    No diagnosis found. PLAN:    In order of problems listed above:  ***      {Are you ordering a CV Procedure (e.g. stress test, cath, DCCV, TEE, etc)?   Press F2        :[id number]}    Medication Adjustments/Labs and Tests Ordered: Current medicines are reviewed at length with the patient today.  Concerns regarding medicines are outlined above.  No orders of the defined types were placed in this encounter.  No orders of the defined types were placed in this encounter.   There are no Patient Instructions on file for this visit.   Signed, Aolani Piggott, MD  05/28/2024 3:58 PM    [facility]

## 2024-06-03 ENCOUNTER — Ambulatory Visit: Attending: Cardiology | Admitting: Cardiology

## 2024-06-17 ENCOUNTER — Other Ambulatory Visit: Payer: Self-pay | Admitting: Family Medicine

## 2024-06-17 ENCOUNTER — Encounter: Payer: Self-pay | Admitting: Family Medicine

## 2024-06-17 NOTE — Telephone Encounter (Unsigned)
 Copied from CRM #[id number]. Topic: Clinical - Medication Refill >> Jun 17, 2024  1:41 PM Wess S wrote: Medication: amiodarone  (PACERONE ) 200 MG tablet   Has the patient contacted their pharmacy? Yes (Agent: If no, request that the patient contact the pharmacy for the refill. If patient does not wish to contact the pharmacy document the reason why and proceed with request.) (Agent: If yes, when and what did the pharmacy advise?)  Pharmacy stated they haven't heard anything This is the patient's preferred pharmacy:  [facility] [address] [place] - [address] [place] [address] Phone: [phone] Fax: [phone]  Is this the correct pharmacy for this prescription? Yes If no, delete pharmacy and type the correct one.   Has the prescription been filled recently? Yes  Is the patient out of the medication? Yes  Has the patient been seen for an appointment in the last year OR does the patient have an upcoming appointment? Yes  Can we respond through MyChart? Yes  Agent: Please be advised that Rx refills may take up to 3 business days. We ask that you follow-up with your pharmacy.

## 2024-06-20 ENCOUNTER — Other Ambulatory Visit: Payer: Self-pay | Admitting: Family Medicine

## 2024-06-20 MED ORDER — AMIODARONE HCL 200 MG PO TABS: 200.0000 mg | ORAL_TABLET | Freq: Two times a day (BID) | ORAL | 1 refills | Status: AC

## 2024-06-21 ENCOUNTER — Telehealth: Payer: Self-pay | Admitting: *Deleted

## 2024-06-21 NOTE — Telephone Encounter (Unsigned)
 Copied from CRM #[id number]. Topic: Clinical - Medication Refill >> Jun 17, 2024  1:41 PM Wess S wrote: Medication: amiodarone  (PACERONE ) 200 MG tablet   Has the patient contacted their pharmacy? Yes (Agent: If no, request that the patient contact the pharmacy for the refill. If patient does not wish to contact the pharmacy document the reason why and proceed with request.) (Agent: If yes, when and what did the pharmacy advise?)  Pharmacy stated they haven't heard anything This is the patient's preferred pharmacy:  [facility] [address] [place] - [address] [place] [address] Phone: [phone] Fax: [phone]  Is this the correct pharmacy for this prescription? Yes If no, delete pharmacy and type the correct one.   Has the prescription been filled recently? Yes  Is the patient out of the medication? Yes  Has the patient been seen for an appointment in the last year OR does the patient have an upcoming appointment? Yes  Can we respond through MyChart? Yes  Agent: Please be advised that Rx refills may take up to 3 business days. We ask that you follow-up with your pharmacy.

## 2024-06-21 NOTE — Telephone Encounter (Unsigned)
 Copied from CRM #[id number]. Topic: Clinical - Prescription Issue >> Jun 17, 2024  5:47 PM Delon T wrote: Reason for CRM:  amiodarone  (PACERONE ) 200 MG tablet- patient is out of this medication and is worried he would have to wait over the weekend for the weekend

## 2024-06-22 NOTE — Telephone Encounter (Signed)
 Cook, Jayce G, DO     06/22/24 10:04 AM I sent this in on the the 7th. Future refills need to come from cardiology

## 2024-06-23 ENCOUNTER — Encounter: Payer: Self-pay | Admitting: Family Medicine

## 2024-06-23 NOTE — Telephone Encounter (Signed)
 Patient notified via mychart

## 2024-06-24 NOTE — Telephone Encounter (Signed)
Duplicate- see other message

## 2024-08-16 ENCOUNTER — Other Ambulatory Visit: Payer: Self-pay | Admitting: Family Medicine

## 2024-11-26 ENCOUNTER — Ambulatory Visit: Admitting: Family Medicine
